# Patient Record
Sex: Male | Born: 1935 | Race: White | Hispanic: No | Marital: Married | Smoking: Former smoker
Health system: Southern US, Community
[De-identification: ages and names within clinical notes are randomized; demographics above are authoritative.]

## PROBLEM LIST (undated history)

## (undated) DIAGNOSIS — E785 Hyperlipidemia, unspecified: Secondary | ICD-10-CM

## (undated) DIAGNOSIS — I2510 Atherosclerotic heart disease of native coronary artery without angina pectoris: Secondary | ICD-10-CM

## (undated) DIAGNOSIS — R079 Chest pain, unspecified: Secondary | ICD-10-CM

## (undated) DIAGNOSIS — R0609 Other forms of dyspnea: Secondary | ICD-10-CM

## (undated) DIAGNOSIS — I10 Essential (primary) hypertension: Secondary | ICD-10-CM

## (undated) DIAGNOSIS — R0600 Dyspnea, unspecified: Secondary | ICD-10-CM

## (undated) DIAGNOSIS — Z951 Presence of aortocoronary bypass graft: Secondary | ICD-10-CM

## (undated) DIAGNOSIS — Z9889 Other specified postprocedural states: Secondary | ICD-10-CM

## (undated) HISTORY — DX: Dyspnea, unspecified: R06.00

## (undated) HISTORY — PX: VASCULAR SURGERY: SHX849

## (undated) HISTORY — DX: Other forms of dyspnea: R06.09

## (undated) HISTORY — DX: Chest pain, unspecified: R07.9

## (undated) HISTORY — DX: Other specified postprocedural states: Z98.890

---

## 2003-11-14 ENCOUNTER — Ambulatory Visit (HOSPITAL_COMMUNITY): Admission: RE | Admit: 2003-11-14 | Discharge: 2003-11-14 | Payer: Self-pay | Admitting: Gastroenterology

## 2003-11-14 ENCOUNTER — Encounter (INDEPENDENT_AMBULATORY_CARE_PROVIDER_SITE_OTHER): Payer: Self-pay | Admitting: *Deleted

## 2008-12-13 ENCOUNTER — Ambulatory Visit (HOSPITAL_COMMUNITY): Admission: RE | Admit: 2008-12-13 | Discharge: 2008-12-13 | Payer: Self-pay | Admitting: General Surgery

## 2008-12-26 ENCOUNTER — Encounter: Admission: RE | Admit: 2008-12-26 | Discharge: 2008-12-26 | Payer: Self-pay | Admitting: Internal Medicine

## 2008-12-30 ENCOUNTER — Encounter: Admission: RE | Admit: 2008-12-30 | Discharge: 2008-12-30 | Payer: Self-pay | Admitting: Internal Medicine

## 2009-01-03 ENCOUNTER — Ambulatory Visit: Payer: Self-pay | Admitting: Vascular Surgery

## 2009-01-06 ENCOUNTER — Encounter: Payer: Self-pay | Admitting: Vascular Surgery

## 2009-01-06 ENCOUNTER — Inpatient Hospital Stay (HOSPITAL_COMMUNITY): Admission: RE | Admit: 2009-01-06 | Discharge: 2009-01-11 | Payer: Self-pay | Admitting: Vascular Surgery

## 2009-01-06 ENCOUNTER — Ambulatory Visit: Payer: Self-pay | Admitting: Vascular Surgery

## 2009-01-17 ENCOUNTER — Ambulatory Visit: Payer: Self-pay | Admitting: Vascular Surgery

## 2009-01-24 ENCOUNTER — Ambulatory Visit: Payer: Self-pay | Admitting: Vascular Surgery

## 2009-03-28 ENCOUNTER — Ambulatory Visit: Payer: Self-pay | Admitting: Vascular Surgery

## 2011-01-02 LAB — BLOOD GAS, ARTERIAL
Acid-base deficit: 0.2 mmol/L (ref 0.0–2.0)
Bicarbonate: 22.3 mEq/L (ref 20.0–24.0)
Bicarbonate: 23.1 mEq/L (ref 20.0–24.0)
O2 Saturation: 96.6 %
Patient temperature: 98.6
TCO2: 24.2 mmol/L (ref 0–100)
pH, Arterial: 7.399 (ref 7.350–7.450)
pH, Arterial: 7.461 — ABNORMAL HIGH (ref 7.350–7.450)
pO2, Arterial: 87.5 mmHg (ref 80.0–100.0)

## 2011-01-02 LAB — CBC
HCT: 36.3 % — ABNORMAL LOW (ref 39.0–52.0)
HCT: 43.1 % (ref 39.0–52.0)
Hemoglobin: 15.1 g/dL (ref 13.0–17.0)
MCHC: 34.3 g/dL (ref 30.0–36.0)
MCHC: 36 g/dL (ref 30.0–36.0)
MCHC: 35 g/dL (ref 30.0–36.0)
MCV: 93.3 fL (ref 78.0–100.0)
MCV: 93.9 fL (ref 78.0–100.0)
MCV: 94.2 fL (ref 78.0–100.0)
MCV: 92.9 fL (ref 78.0–100.0)
Platelets: 164 10*3/uL (ref 150–400)
Platelets: 181 10*3/uL (ref 150–400)
Platelets: 278 10*3/uL (ref 150–400)
RBC: 3.9 MIL/uL — ABNORMAL LOW (ref 4.22–5.81)
RBC: 4.32 MIL/uL (ref 4.22–5.81)
RDW: 13.4 % (ref 11.5–15.5)
RDW: 13.9 % (ref 11.5–15.5)
RDW: 14.2 % (ref 11.5–15.5)
WBC: 12.6 10*3/uL — ABNORMAL HIGH (ref 4.0–10.5)

## 2011-01-02 LAB — PROTIME-INR
INR: 1 (ref 0.00–1.49)
Prothrombin Time: 14.9 seconds (ref 11.6–15.2)
Prothrombin Time: 13 seconds (ref 11.6–15.2)

## 2011-01-02 LAB — GLUCOSE, CAPILLARY
Glucose-Capillary: 141 mg/dL — ABNORMAL HIGH (ref 70–99)
Glucose-Capillary: 174 mg/dL — ABNORMAL HIGH (ref 70–99)

## 2011-01-02 LAB — COMPREHENSIVE METABOLIC PANEL
ALT: 18 U/L (ref 0–53)
Albumin: 2.7 g/dL — ABNORMAL LOW (ref 3.5–5.2)
Albumin: 4.1 g/dL (ref 3.5–5.2)
Alkaline Phosphatase: 42 U/L (ref 39–117)
Alkaline Phosphatase: 67 U/L (ref 39–117)
BUN: 14 mg/dL (ref 6–23)
Chloride: 106 mEq/L (ref 96–112)
Creatinine, Ser: 0.82 mg/dL (ref 0.4–1.5)
GFR calc non Af Amer: >60 mL/min (ref >60)
Glucose, Bld: 94 mg/dL (ref 70–99)
Potassium: 3.9 mEq/L (ref 3.5–5.1)
Potassium: 4.3 mEq/L (ref 3.5–5.1)
Sodium: 137 mEq/L (ref 135–145)
Total Bilirubin: 0.6 mg/dL (ref 0.3–1.2)
Total Protein: 5 g/dL — ABNORMAL LOW (ref 6.0–8.3)

## 2011-01-02 LAB — URINALYSIS, ROUTINE W REFLEX MICROSCOPIC
Bilirubin Urine: NEGATIVE
Hgb urine dipstick: NEGATIVE
Ketones, ur: NEGATIVE mg/dL
Nitrite: NEGATIVE
Protein, ur: NEGATIVE mg/dL
Urobilinogen, UA: 1 mg/dL (ref 0.0–1.0)

## 2011-01-02 LAB — CROSSMATCH: Antibody Screen: NEGATIVE

## 2011-01-02 LAB — BASIC METABOLIC PANEL
BUN: 10 mg/dL (ref 6–23)
CO2: 24 mEq/L (ref 19–32)
CO2: 26 mEq/L (ref 19–32)
Chloride: 104 mEq/L (ref 96–112)
Chloride: 106 mEq/L (ref 96–112)
Creatinine, Ser: 0.87 mg/dL (ref 0.4–1.5)
Creatinine, Ser: 0.89 mg/dL (ref 0.4–1.5)
GFR calc Af Amer: >60 mL/min (ref >60)
GFR calc Af Amer: >60 mL/min (ref >60)
Potassium: 4.2 mEq/L (ref 3.5–5.1)

## 2011-01-02 LAB — POCT I-STAT 7, (LYTES, BLD GAS, ICA,H+H)
Calcium, Ion: 1.08 mmol/L — ABNORMAL LOW (ref 1.12–1.32)
O2 Saturation: 100 %
Potassium: 4 mEq/L (ref 3.5–5.1)
Sodium: 138 mEq/L (ref 135–145)
TCO2: 26 mmol/L (ref 0–100)
pCO2 arterial: 39 mmHg (ref 35.0–45.0)

## 2011-01-02 LAB — AMYLASE: Amylase: 72 U/L (ref 27–131)

## 2011-01-02 LAB — HEMOGLOBIN A1C: Hgb A1c MFr Bld: 5.3 % (ref 4.6–6.1)

## 2011-01-02 LAB — APTT: aPTT: 28 seconds (ref 24–37)

## 2011-02-05 NOTE — Assessment & Plan Note (Signed)
OFFICE VISIT   Derek Wells, Derek Wells A  DOB:  [birth year]                                       01/24/2009  CHART#:[id number]   The patient is a status post resection and grafting of a large  infrarenal abdominal aortic aneurysm with an aortobi-common iliac graft  performed on April 16.  He had an uneventful postoperative course and  since his discharge has had some moderate problems with poor appetite  and some mild nausea but no vomiting.  His bowel movements are being  regulated with daily Dulcolax tablets and he has had no abdominal  distention.  He is drinking plenty of liquids but has lost between 15  and 20 pounds but feels that his appetite is beginning to improve and  that his weight has stabilized.   PHYSICAL EXAM:  Today blood pressure 114/76, heart rate is 100,  respirations 14.  His abdominal incision is well-healed with no evidence  of ventral hernia in the midline.  His abdomen is soft with minimal  tenderness.  He has 3+ femoral and distal pulses bilaterally.  ABIs are  1.09 on the right and 1.23 on the left.   In general he is making good progress but does have typical anorexia and  weakness which should resolve at any time.  I have encouraged him to  drink plenty of liquids and to increase his activity each day as  tolerated which should help his appetite.  He was given a prescription  for Phenergan tablets to use p.r.n. for nausea and he is no longer  needing any pain medicine.  He will return to see me in 2 months for  further followup unless he has any problems in the interim.   Quita Skye Hart Rochester, M.D.  Electronically Signed   JDL/MEDQ  D:  01/24/2009  T:  01/25/2009  Job:  [id number]   cc:   Candyce Churn, M.D.

## 2011-02-05 NOTE — H&P (Signed)
HISTORY AND PHYSICAL EXAMINATION   January 03, 2009   Re:  AGUSTINE, ROSSITTO A                  DOB:  1933/06/07   CHIEF COMPLAINT:  Infrarenal abdominal aortic aneurysm.   HISTORY OF PRESENT ILLNESS:  This 77 year old male patient recently had  inguinal hernia repair by Dr. Claud Kelp.  Examination under  anesthesia revealed a pulsatile mass and a subsequent CT scan revealed  an 8.4 cm infrarenal abdominal aortic aneurysm.  The infrarenal neck was  too large for aortic stent grafting and he was scheduled for an elective  resection and grafting of this large aneurysm.  He had no previous CT  scans or history of abdominal or back symptoms.   PAST MEDICAL HISTORY:  1. Hypertension.  2. Hyperlipidemia.  3. Negative for diabetes, COPD, stroke.  There is questionable history      of coronary artery disease 20 years ago with a possible small      myocardial infarction although not confirmed.  Recent Cardiolite      performed.   PAST SURGICAL HISTORY:  1. Left inguinal hernia repair.  2. Scrotal hernia repair at age 65.   FAMILY HISTORY:  Positive for coronary artery disease in his mother,  father and brother.  Positive for stroke possibly in his mother.  Negative for diabetes.   SOCIAL HISTORY:  He is widowed, has two children and is retired, worked  for [employer] and retired 14 years ago.  Has not smoked cigarettes in many  years but does smoke occasional cigars.  Drinks occasional alcohol.   REVIEW OF SYSTEMS:  Denies any chest pain, dyspnea on exertion, PND,  orthopnea, productive cough, bronchitis, asthma, wheezing.  No GI or GU  symptoms.  No claudication.  Generally unremarkable.   ALLERGIES:  To penicillin which causes a rash.   MEDICATIONS:  1. Lipitor 20 mg one daily.  2. Diovan 12.5 mg one daily.  3. Multivitamin.  4. Centrum one daily.  5. Fish oil 1000 mg one daily.  6. Aspirin 81 mg one daily.   PHYSICAL EXAMINATION:  Vital signs:  Blood pressure  150/80, heart rate  68, respirations 14.  General:  He is a healthy-appearing male in no  apparent distress.  Alert and oriented x3.  Neck:  Supple, 3+ carotid  pulses palpable.  No bruits are audible.  Neurologic:  Normal.  No  palpable adenopathy in the neck.  Chest:  Clear to auscultation.  Cardiovascular:  Regular rhythm.  No murmurs.  Abdomen:  Soft with a  large pulsatile mass approximating 8-9 cm which is nontender.  He has 3+  femoral, popliteal and 2+ posterior tibial pulses bilaterally.  Both  feet are well-perfused.   IMPRESSION:  1. Large infrarenal abdominal aortic aneurysm.  2. Hypertension.  3. Hyperlipidemia.   PLAN:  Is to admit the patient on April 16 for an elective resection and  grafting of his large infrarenal abdominal aortic aneurysm.  Risks and  benefits have been thoroughly discussed with the patient and he would  like to proceed.  He did have a preoperative Cardiolite examination  performed.   Quita Skye Hart Rochester, M.D.  Electronically Signed   JDL/MEDQ  D:  01/03/2009  T:  01/04/2009  Job:  [id number]   cc:   Candyce Churn, M.D.

## 2011-02-05 NOTE — Op Note (Signed)
NAME:  Derek Wells, Derek Wells NO.:  [id number]   MEDICAL RECORD NO.:  [id number]          PATIENT TYPE:  INP   LOCATION:  2315                         FACILITY:  [facility]   PHYSICIAN:  Quita Skye. Hart Rochester, M.D.  DATE OF BIRTH:  May 05, 1933   DATE OF PROCEDURE:  01/06/2009  DATE OF DISCHARGE:                               OPERATIVE REPORT   PREOPERATIVE DIAGNOSIS:  Large infrarenal abdominal aortic aneurysm.   POSTOPERATIVE DIAGNOSIS:  Large infrarenal abdominal aortic aneurysm.   OPERATION:  Resection and grafting of large infrarenal abdominal aortic  aneurysm with insertion of an aorto-bi-common iliac graft (18 x 9 mm)  Hemashield Dacron.   SURGEON:  Travante Knee. Hart Rochester, MD   FIRST ASSISTANT:  Jerold Coombe, PA   ANESTHESIA:  General endotracheal.   PROCEDURE:  The patient was taken to the operating room and placed in  the supine position at which time satisfactory general endotracheal  anesthesia was administered.  After induction of anesthesia and  insertion of a Swan-Ganz catheter and radial arterial line by  Anesthesia, the abdomen and groins were prepped with Betadine scrub and  solution and draped in a routine sterile manner.  Midline incision was  made in xiphoid to near the pubis, carried down through the subcutaneous  tissue and linea alba using Bovie.  Peritoneal cavity was entered and  thoroughly explored.  The stomach, duodenum, small bowel, and colon were  unremarkable.  Liver was smooth.  No palpable masses.  Gallbladder  appeared normal.  No stones were palpable.  Transverse colon was  elevated.  The intestines were reflected to the right side exposing a  large infrarenal aneurysm measuring between 8-9 cm in diameter.  Retroperitoneum was incised.  The neck of the aneurysm was dissected  free and was quite generous being about 3-3.5 mm in maximum diameter.  Left renal vein was mobilized to assist in exposure.  Both common iliac  arteries had diffuse  calcification, but were widely patent on the  preoperative study and had excellent pulses.  This was all dissected  free.  The patient was given 25 g of mannitol and heparinized.  Aorta  was occluded distal to the renal arteries.  Both common iliac arteries  were occluded.  Aneurysm was opened longitudinally and a large amount  laminated thrombus was removed.  There were a few lumbars, oversewn with  2-0 silk figure-of-eight sutures from within.  Inferior mesenteric  artery was oversewn at its origin with 2-0 silk figure-of-eight stitch.  Aneurysm neck was transected about 2-3 cm distal to the renal arteries  and was relatively free of disease.  An 18 x 9 mm Hemashield Dacron  graft anastomosed end-to-end to the aortic stump using continuous 3-0  buttressing this with a strip of felt.  This was checked for leaks, none  were present.  Following this, both common iliac arteries having been  transected about 3 cm proximal to the bifurcation were examined and had  good backbleeding.  Although they were diffusely diseased, they were  widely patent.  End-to-end anastomoses were done, left initially  followed by the right with 5-0 Prolene.  After opening both legs and no  significant hypotension occurred, protamine was given to reverse the  heparin and following adequate hemostasis, aneurysm closed over the  graft with 3-0 Vicryl.  Retroperitoneum  was reapproximated with 3-0 Vicryl and following thorough irrigation,  linea alba closed with #1 Prolene.  Skin was closed with clips.  Sterile  dressing was applied.  The patient was taken to the recovery room in  stable condition, extubated, had an excellent urinary output, received  700 mL of blood from the Cell Saver.      Quita Skye Hart Rochester, M.D.  Electronically Signed     JDL/MEDQ  D:  01/06/2009  T:  01/07/2009  Job:  [id number]

## 2011-02-05 NOTE — Op Note (Signed)
NAME:  Derek Wells, Derek Wells NO.:  [id number]   MEDICAL RECORD NO.:  [id number]          PATIENT TYPE:  AMB   LOCATION:  DAY                          FACILITY:  [facility]   PHYSICIAN:  Angelia Mould. Derrell Lolling, M.D.DATE OF BIRTH:  05-16-1933   DATE OF PROCEDURE:  12/13/2008  DATE OF DISCHARGE:                               OPERATIVE REPORT   PREOPERATIVE DIAGNOSIS:  Recurrent left inguinal hernia.   POSTOPERATIVE DIAGNOSIS:  Recurrent left inguinal hernia.   OPERATION PERFORMED:  Laparoscopic, preperitoneal repair of recurrent  left inguinal hernia with mesh.   SURGEON:  Dr. Claud Kelp.   OPERATIVE INDICATIONS:  This is a 77 year old white man who had a left  inguinal repair when he was in the ninth grade and he states that he had  a prophylactic appendectomy done at the same time.  He recently  presented with a 2-year history of a bulge and some burning discomfort  in his left groin and this has been getting larger.  On evaluation in my  office, I found that he had a small to medium sized left inguinal hernia  that was reducible but no evidence of hernia on the right.  No scrotal  mass.  He is brought to the operating room electively.   OPERATIVE FINDINGS:  The patient had an obvious moderate-sized direct  left inguinal hernia.  The peritoneum was riding up very high on the  cord structures but was not actually entering the internal ring.  We did  strip this well back and away.  He had a small lipoma of the cord which  was also reduced.  It is notable that when he was put to sleep, I was  palpating his abdomen and felt that he might have an abdominal aortic  aneurysm.  I discussed this with Dr. Johnella Moloney by phone and he is  going to call the patient tomorrow and arrange for an ultrasound and  evaluation of possible abdominal aortic aneurysm.   OPERATIVE TECHNIQUE:  Following the induction of general endotracheal  anesthesia, a Foley catheter was inserted, the  bladder was emptied and  the Foley balloon deflated.  The abdomen and genitalia were prepped and  draped in sterile fashion.  Intravenous antibiotics were given.  The  patient was identified as to correct patient, correct procedure, and  correct site.  0.5% Marcaine with epinephrine was used as a local  infiltration anesthetic.  Transverse incision was made at the lower rim  of the umbilicus.  The fascia was incised transversely, exposing the  medial border of the left rectus muscle.  We bluntly dissected the  rectus muscle away from the rectus sheath.  A balloon dissector was  inserted into the left rectus sheath and inserted down in the midline to  just above the symphysis pubis.  The video cam was inserted.  The  dissector balloon was inflated with air under direct vision.  We had  good visualization of the rectus muscles anteriorly, preperitoneal fat  posteriorly, and Cooper's ligaments and symphysis pubis inferiorly.  We  held the balloon in place  for 5 minutes.  The balloon was then deflated  and removed.  The trocar balloon was inflated and secured.  The trocar  was connected to the insufflator at 12 mmHg and the preperitoneal space  was inflated.  The video cam was inserted.  We had good visualization.  A 5-mm trocar was placed in the infraumbilical midline under direct  vision.  I used this to clean off the muscle on the right lower quadrant  a little bit and inserted a 5-mm trocar in the right lower quadrant.   I then dissected the left side.  We identified the symphysis pubis and  Cooper's ligament.  He had a pseudo sac associated with the direct  hernia on the left.  This was adherent to the fatty tissues and this was  dissected completely away and then the pseudo sac completely retracted  back up in the direct hernia space.  We then dissected the cord  structures on the left side being careful to identify the vas deferens  and testicular vessels and staying away from the  iliac vessels.  The  edge of the peritoneum was riding very high on the cord structures on  the left side but the peritoneum was not entering the internal ring.  I  dissected the peritoneum completely off of the cord structures back  above the level of the anterior superior iliac spine.  We cleaned off  the muscle laterally.  We inserted a Bard 3-D Max mesh, large size and  positioned it on the left side so that it overlapped the midline  slightly, overlapped Cooper's ligaments slightly, and was deployed  laterally quite nicely.  We used a 5 mm screw tacker.  I placed 4 screws  long the superior rim of Cooper's ligament and the symphysis pubis.  Laterally, the mesh deployed quite nicely well lateral to the internal  ring.  I secured this with about 3 screw tacks.  I was very careful  laterally to be sure that I could palpate the screw tacker through the  abdominal wall to be sure that I stayed above the iliopubic tract.  I  then secured the mesh along its superior rim with a few screw tacks and  up the midline with a couple of screw tacks and the repair appeared  complete.  The mesh laid nicely without any over folding or fluting and  I was very satisfied.  There was no bleeding.  The pneumoperitoneum was  released.  All the trocars were removed.  The fascia at the umbilicus  was closed with two interrupted figure-of-eight sutures of 0 Vicryl.  The skin incisions were closed with subcuticular sutures of 4-0 Monocryl  and Steri-Strips.  Clean bandages were placed and the patient taken to  the recovery room in stable condition.  Estimated blood loss was about  10 mL.  Complications none.  Sponge, needle and instrument counts were  correct.      Angelia Mould. Derrell Lolling, M.D.  Electronically Signed     HMI/MEDQ  D:  12/13/2008  T:  12/13/2008  Job:  [id number]   cc:   Candyce Churn, M.D.  Fax: [phone]

## 2011-02-05 NOTE — Discharge Summary (Signed)
NAME:  Derek Wells, Derek Wells NO.:  [id number]   MEDICAL RECORD NO.:  [id number]          PATIENT TYPE:  INP   LOCATION:  2029                         FACILITY:  [facility]   PHYSICIAN:  Quita Skye. Hart Rochester, M.D.  DATE OF BIRTH:  [birth year]   DATE OF ADMISSION:  01/06/2009  DATE OF DISCHARGE:  01/11/2009                               DISCHARGE SUMMARY   FINAL DISCHARGE DIAGNOSES:  1. An 8.4-cm infrarenal abdominal aortic aneurysm.  2. Hypertension.  3. Hyperlipidemia.   PROCEDURES PERFORMED:  Resection and grafting of a large infrarenal  abdominal aortic aneurysm with insertion of aortobifemoral common iliac  graft 18- x 9-mm Hemashield Dacron by Dr. Hart Rochester, January 06, 2009.   COMPLICATIONS:  None.   CONDITION ON DISCHARGE:  Stable, improving.   DISCHARGE MEDICATIONS:  He is instructed to resume all preoperative  medications consisting of:  1. Lipitor 40 mg one-half tablet p.o. daily.  2. Baby aspirin 81 mg p.o. daily.  3. Fish oil 1000 mg p.o. daily.  4. Diovan/hydrochlorothiazide 160/12.5 mg p.o. daily.  5. He is given a prescription for Percocet 5/325 p.o. q.4 h. p.r.n.      pain, total #40 were given.   DISPOSITION:  He is being discharged home in stable condition with his  wounds healing well.  He is given very careful instructions regarding  the care of his wounds and his activity level.  He is given an return  appointment in 1 week for staple removal and 2 weeks for evaluation by  Dr. Hart Rochester.  The office will arrange a visit.   BRIEF IDENTIFYING STATEMENT:  For complete details, please refer the  typed history and physical.  Briefly, this very pleasant 77 year old  gentleman was referred to Dr. Hart Rochester following repair of an inguinal  hernia at which time a large pulsatile mass in his abdomen was felt.  Dr. Hart Rochester evaluated him and found him to have an 8.4-cm infrarenal  abdominal aortic aneurysm.  He recommended repair.  [name] was  informed of the risks and  benefits of the procedure and after careful  consideration, he elected to proceed with surgery.   HOSPITAL COURSE:  Preoperative workup was completed as an outpatient.  He was brought in through outpatient surgery and underwent the  aforementioned repair of his abdominal aortic aneurysm.  For complete  details, please refer to the typed operative report.  The procedure was  without complications.  He was returned to a bed in intensive care unit  in critical, but hemodynamically stable condition.  He was able to be  extubated by the first postoperative  day.  He was able to be transferred to a bed on a surgical convalescent  floor on the first postoperative day.  Following his transfer, his  activity level and diet were increased as tolerated.  By January 11, 2009,  he was desirous of discharge.  He was felt stable and was discharged  home in stable condition.      Wilmon Arms, PA      Quita Skye Hart Rochester, M.D.  Electronically Signed  KEL/MEDQ  D:  01/11/2009  T:  01/11/2009  Job:  [id number]

## 2011-02-05 NOTE — Assessment & Plan Note (Signed)
OFFICE VISIT   RAMAJ, FRANGOS A  DOB:  07-16-1933                                       03/28/2009  CHART#:[id number]   The patient is 3 months status post resection and grafting of a large  infrarenal abdominal aortic aneurysm with insertion of aortobicommon  iliac graft.  He has done very well with no complications or specific  complaints today other than some occasional mild dizziness when he  arises.  He is having regular bowel movements.  His normal appetite has  returned.  He has some mild urinary obstruction symptoms with nocturia,  a little more so that it was preoperatively.  Otherwise, he has no  complaints and no claudication.   On physical examination blood pressure 177/84 sitting, 161/76 supine,  heart rate is 87.  Abdominal incision is well-healed.  No evidence of  ventral hernia.  He has 3+ femoral, popliteal and posterior tibial  pulses bilaterally.  Both feet are well-perfused.   I am very pleased with his results.  I encouraged him to resume his  normal activities as tolerated and he will return to see [place] on a p.r.n.  basis.   Quita Skye Hart Rochester, M.D.  Electronically Signed   JDL/MEDQ  D:  03/28/2009  T:  03/29/2009  Job:  [id number]

## 2011-02-08 NOTE — Op Note (Signed)
NAME:  Derek Wells, Derek Wells                            ACCOUNT NO.:  [id number]   MEDICAL RECORD NO.:  [id number]                   PATIENT TYPE:  AMB   LOCATION:  ENDO                                 FACILITY:  [facility]   PHYSICIAN:  Stefen L. Malon Kindle., M.D.          DATE OF BIRTH:  [birth year]   DATE OF PROCEDURE:  11/14/2003  DATE OF DISCHARGE:                                 OPERATIVE REPORT   PROCEDURE:  Colonoscopy and polypectomy.   MEDICATIONS:  Fentanyl 100 mcg, Versed 10 mg IV, Phenergan 12.5 mg IV.   ENDOSCOPE:  Olympus pediatric adjustable colonoscope.   INDICATIONS:  Colon cancer screening.   DESCRIPTION OF PROCEDURE:  The procedure had been explained to the patient  and consent obtained.  With the patient in the left lateral decubitus  position, the Olympus scope was inserted and advanced.  We advanced into the  colon.  Prep was excellent.  We were able to advance easily to the cecum.  The ileocecal valve and appendiceal orifice were seen.  The scope was  withdrawn.  The cecum was seen well.  In the mid-ascending colon a 0.75 cm  sessile polyp was encountered and removed with the snare and sucked through  the scope.  The transverse colon was free of polyps.  In the proximal  descending colon a 0.75 cm polyp was removed and sucked through the scope.  There was no bleeding at either of the polypectomy sites.  No other polyps  were seen in the descending or sigmoid colon.  There was no significant  diverticulosis.  The scope was withdrawn, and the rectum was free of polyps.  The patient tolerated the procedure well.   ASSESSMENT:  Colon polyps removed, 211.3.   PLAN:  Routine postpolypectomy instructions.  Will recommend repeating in  three years.                                               Reedy L. Malon Kindle., M.D.    Waldron Session  D:  11/14/2003  T:  11/14/2003  Job:  [id number]   cc:   Candyce Churn, M.D.  [address] [place]  [place] [address]  Fax:  [phone]

## 2012-09-10 ENCOUNTER — Encounter: Payer: Self-pay | Admitting: Vascular Surgery

## 2013-01-14 ENCOUNTER — Other Ambulatory Visit: Payer: Self-pay | Admitting: *Deleted

## 2013-01-14 ENCOUNTER — Encounter (HOSPITAL_COMMUNITY): Payer: Self-pay

## 2013-01-14 NOTE — Addendum Note (Signed)
Addended by: Nicki Guadalajara A on: 01/14/2013 07:05 PM   Modules accepted: Orders

## 2013-01-15 ENCOUNTER — Encounter (HOSPITAL_COMMUNITY): Payer: Self-pay | Admitting: Certified Registered"

## 2013-01-15 ENCOUNTER — Ambulatory Visit (HOSPITAL_COMMUNITY): Payer: Medicare Other

## 2013-01-15 ENCOUNTER — Inpatient Hospital Stay (HOSPITAL_COMMUNITY)
Admission: RE | Admit: 2013-01-15 | Discharge: 2013-01-19 | DRG: 234 | Disposition: A | Discharge status: Home / Self Care | Payer: Medicare Other | Source: Ambulatory Visit | Attending: Cardiovascular Disease | Admitting: Cardiovascular Disease

## 2013-01-15 ENCOUNTER — Encounter (HOSPITAL_COMMUNITY)
Admission: RE | Disposition: A | Discharge status: Home / Self Care | Payer: Self-pay | Source: Ambulatory Visit | Attending: Cardiovascular Disease

## 2013-01-15 ENCOUNTER — Encounter (HOSPITAL_COMMUNITY): Payer: Self-pay | Admitting: Certified Registered Nurse Anesthetist

## 2013-01-15 ENCOUNTER — Ambulatory Visit (HOSPITAL_COMMUNITY): Payer: Medicare Other | Admitting: Certified Registered"

## 2013-01-15 ENCOUNTER — Inpatient Hospital Stay (HOSPITAL_COMMUNITY): Payer: Medicare Other

## 2013-01-15 DIAGNOSIS — Z8249 Family history of ischemic heart disease and other diseases of the circulatory system: Secondary | ICD-10-CM

## 2013-01-15 DIAGNOSIS — I200 Unstable angina: Secondary | ICD-10-CM | POA: Diagnosis present

## 2013-01-15 DIAGNOSIS — D696 Thrombocytopenia, unspecified: Secondary | ICD-10-CM | POA: Diagnosis not present

## 2013-01-15 DIAGNOSIS — D62 Acute posthemorrhagic anemia: Secondary | ICD-10-CM | POA: Diagnosis not present

## 2013-01-15 DIAGNOSIS — I2510 Atherosclerotic heart disease of native coronary artery without angina pectoris: Principal | ICD-10-CM | POA: Diagnosis present

## 2013-01-15 DIAGNOSIS — I700 Atherosclerosis of aorta: Secondary | ICD-10-CM | POA: Diagnosis present

## 2013-01-15 DIAGNOSIS — I059 Rheumatic mitral valve disease, unspecified: Secondary | ICD-10-CM | POA: Diagnosis present

## 2013-01-15 DIAGNOSIS — E8779 Other fluid overload: Secondary | ICD-10-CM | POA: Diagnosis not present

## 2013-01-15 DIAGNOSIS — I10 Essential (primary) hypertension: Secondary | ICD-10-CM | POA: Diagnosis present

## 2013-01-15 DIAGNOSIS — Z951 Presence of aortocoronary bypass graft: Secondary | ICD-10-CM | POA: Diagnosis present

## 2013-01-15 DIAGNOSIS — E785 Hyperlipidemia, unspecified: Secondary | ICD-10-CM | POA: Diagnosis present

## 2013-01-15 DIAGNOSIS — I7389 Other specified peripheral vascular diseases: Secondary | ICD-10-CM | POA: Diagnosis present

## 2013-01-15 HISTORY — DX: Essential (primary) hypertension: I10

## 2013-01-15 HISTORY — PX: LEFT HEART CATHETERIZATION WITH CORONARY ANGIOGRAM: SHX5451

## 2013-01-15 HISTORY — PX: CARDIAC CATHETERIZATION: SHX172

## 2013-01-15 HISTORY — PX: CORONARY ARTERY BYPASS GRAFT: SHX141

## 2013-01-15 HISTORY — DX: Presence of aortocoronary bypass graft: Z95.1

## 2013-01-15 HISTORY — PX: INTRAOPERATIVE TRANSESOPHAGEAL ECHOCARDIOGRAM: SHX5062

## 2013-01-15 HISTORY — DX: Atherosclerotic heart disease of native coronary artery without angina pectoris: I25.10

## 2013-01-15 HISTORY — DX: Hyperlipidemia, unspecified: E78.5

## 2013-01-15 LAB — POCT I-STAT 4, (NA,K, GLUC, HGB,HCT)
Glucose, Bld: 98 mg/dL (ref 70–99)
Glucose, Bld: 87 mg/dL (ref 70–99)
Glucose, Bld: 110 mg/dL — ABNORMAL HIGH (ref 70–99)
HCT: 39 % (ref 39.0–52.0)
HCT: 27 % — ABNORMAL LOW (ref 39.0–52.0)
HCT: 30 % — ABNORMAL LOW (ref 39.0–52.0)
Hemoglobin: 13.3 g/dL (ref 13.0–17.0)
Hemoglobin: 10.2 g/dL — ABNORMAL LOW (ref 13.0–17.0)
Hemoglobin: 9.2 g/dL — ABNORMAL LOW (ref 13.0–17.0)
Potassium: 3.8 mEq/L (ref 3.5–5.1)
Potassium: 4.8 mEq/L (ref 3.5–5.1)
Potassium: 4.8 mEq/L (ref 3.5–5.1)
Sodium: 141 mEq/L (ref 135–145)
Sodium: 131 mEq/L — ABNORMAL LOW (ref 135–145)
Sodium: 140 mEq/L (ref 135–145)

## 2013-01-15 LAB — BASIC METABOLIC PANEL
BUN: 20 mg/dL (ref 6–23)
BUN: 17 mg/dL (ref 6–23)
CO2: 26 mEq/L (ref 19–32)
Calcium: 9.1 mg/dL (ref 8.4–10.5)
Calcium: 9.1 mg/dL (ref 8.4–10.5)
Creatinine, Ser: 0.94 mg/dL (ref 0.50–1.35)
GFR calc Af Amer: >90 mL/min (ref >90)
GFR calc non Af Amer: 69 mL/min — ABNORMAL LOW (ref >90)
GFR calc non Af Amer: 78 mL/min — ABNORMAL LOW (ref >90)
Glucose, Bld: 116 mg/dL — ABNORMAL HIGH (ref 70–99)
Sodium: 139 mEq/L (ref 135–145)

## 2013-01-15 LAB — PLATELET COUNT: Platelets: 144 10*3/uL — ABNORMAL LOW (ref 150–400)

## 2013-01-15 LAB — CBC
HCT: 43.6 % (ref 39.0–52.0)
HCT: 32.4 % — ABNORMAL LOW (ref 39.0–52.0)
Hemoglobin: 14.7 g/dL (ref 13.0–17.0)
MCH: 31.4 pg (ref 26.0–34.0)
MCH: 31.5 pg (ref 26.0–34.0)
MCHC: 35.3 g/dL (ref 30.0–36.0)
MCHC: 35.6 g/dL (ref 30.0–36.0)
MCHC: 35.2 g/dL (ref 30.0–36.0)
MCV: 88.9 fL (ref 78.0–100.0)
MCV: 89.2 fL (ref 78.0–100.0)
MCV: 89.5 fL (ref 78.0–100.0)
Platelets: 223 10*3/uL (ref 150–400)
RBC: 4.68 MIL/uL (ref 4.22–5.81)
RDW: 14.4 % (ref 11.5–15.5)
RDW: 14.2 % (ref 11.5–15.5)

## 2013-01-15 LAB — POCT I-STAT 3, ART BLOOD GAS (G3+)
pCO2 arterial: 48.6 mmHg — ABNORMAL HIGH (ref 35.0–45.0)
pH, Arterial: 7.308 — ABNORMAL LOW (ref 7.350–7.450)

## 2013-01-15 LAB — PROTIME-INR
INR: 1.37 (ref 0.00–1.49)
Prothrombin Time: 12.9 seconds (ref 11.6–15.2)

## 2013-01-15 LAB — HEMOGLOBIN AND HEMATOCRIT, BLOOD: Hemoglobin: 10.5 g/dL — ABNORMAL LOW (ref 13.0–17.0)

## 2013-01-15 SURGERY — LEFT HEART CATHETERIZATION WITH CORONARY ANGIOGRAM
Anesthesia: LOCAL

## 2013-01-15 SURGERY — CORONARY ARTERY BYPASS GRAFTING (CABG)
Anesthesia: General | Site: Chest | Wound class: Clean

## 2013-01-15 MED ORDER — MAGNESIUM SULFATE 50 % IJ SOLN
40.0000 meq | INTRAMUSCULAR | Status: DC
  Filled 2013-01-15: qty 10

## 2013-01-15 MED ORDER — SODIUM CHLORIDE 0.9 % IV SOLN: 250.0000 mL | INTRAVENOUS | Status: DC

## 2013-01-15 MED ORDER — LACTATED RINGERS IV SOLN: INTRAVENOUS | Status: DC

## 2013-01-15 MED ORDER — LACTATED RINGERS IV SOLN
INTRAVENOUS | Status: DC | PRN
  Administered 2013-01-15: 15:00:00 via INTRAVENOUS

## 2013-01-15 MED ORDER — PHENYLEPHRINE HCL 10 MG/ML IJ SOLN
0.0000 ug/min | INTRAVENOUS | Status: DC
  Filled 2013-01-15: qty 2

## 2013-01-15 MED ORDER — MAGNESIUM SULFATE 40 MG/ML IJ SOLN
4.0000 g | Freq: Once | INTRAMUSCULAR | Status: AC
  Administered 2013-01-15: 4 g via INTRAVENOUS
  Filled 2013-01-15: qty 100

## 2013-01-15 MED ORDER — SODIUM CHLORIDE 0.9 % IV SOLN
INTRAVENOUS | Status: DC | PRN
  Administered 2013-01-15: 20:00:00 via INTRAVENOUS

## 2013-01-15 MED ORDER — DEXMEDETOMIDINE HCL IN NACL 400 MCG/100ML IV SOLN
0.1000 ug/kg/h | INTRAVENOUS | Status: DC
  Administered 2013-01-15: 0.2 ug/kg/h via INTRAVENOUS
  Filled 2013-01-15: qty 100

## 2013-01-15 MED ORDER — ACETAMINOPHEN 160 MG/5ML PO SOLN: 975.0000 mg | Freq: Four times a day (QID) | ORAL | Status: DC

## 2013-01-15 MED ORDER — LIDOCAINE HCL (PF) 1 % IJ SOLN
INTRAMUSCULAR | Status: AC
  Filled 2013-01-15: qty 30

## 2013-01-15 MED ORDER — SODIUM CHLORIDE 0.9 % IV SOLN
100.0000 [IU] | INTRAVENOUS | Status: DC | PRN
  Administered 2013-01-15: 1.3 [IU]/h via INTRAVENOUS

## 2013-01-15 MED ORDER — PLASMA-LYTE 148 IV SOLN
INTRAVENOUS | Status: AC
  Administered 2013-01-15: 18:00:00
  Filled 2013-01-15: qty 2.5

## 2013-01-15 MED ORDER — DIAZEPAM 5 MG PO TABS
5.0000 mg | ORAL_TABLET | ORAL | Status: AC
  Administered 2013-01-15: 5 mg via ORAL
  Filled 2013-01-15: qty 1

## 2013-01-15 MED ORDER — EPINEPHRINE HCL 1 MG/ML IJ SOLN
0.5000 ug/min | INTRAVENOUS | Status: DC
  Filled 2013-01-15: qty 4

## 2013-01-15 MED ORDER — AMINOCAPROIC ACID 250 MG/ML IV SOLN
INTRAVENOUS | Status: DC | PRN
  Administered 2013-01-15: 5 g via INTRAVENOUS

## 2013-01-15 MED ORDER — INSULIN REGULAR BOLUS VIA INFUSION
0.0000 [IU] | Freq: Three times a day (TID) | INTRAVENOUS | Status: DC
  Filled 2013-01-15: qty 10

## 2013-01-15 MED ORDER — ACETAMINOPHEN 500 MG PO TABS
1000.0000 mg | ORAL_TABLET | Freq: Four times a day (QID) | ORAL | Status: DC
  Administered 2013-01-16 – 2013-01-17 (×5): 1000 mg via ORAL
  Filled 2013-01-15 (×9): qty 2

## 2013-01-15 MED ORDER — ROCURONIUM BROMIDE 100 MG/10ML IV SOLN
INTRAVENOUS | Status: DC | PRN
  Administered 2013-01-15: 50 mg via INTRAVENOUS

## 2013-01-15 MED ORDER — LACTATED RINGERS IV SOLN
INTRAVENOUS | Status: DC | PRN
  Administered 2013-01-15 (×2): via INTRAVENOUS

## 2013-01-15 MED ORDER — PROTAMINE SULFATE 10 MG/ML IV SOLN
INTRAVENOUS | Status: DC | PRN
  Administered 2013-01-15: 340 mg via INTRAVENOUS

## 2013-01-15 MED ORDER — THROMBIN 20000 UNITS EX SOLR
OROMUCOSAL | Status: DC | PRN
  Administered 2013-01-15 (×3): via TOPICAL

## 2013-01-15 MED ORDER — LEVOFLOXACIN IN D5W 500 MG/100ML IV SOLN
500.0000 mg | INTRAVENOUS | Status: DC
  Filled 2013-01-15: qty 100

## 2013-01-15 MED ORDER — ATORVASTATIN CALCIUM 40 MG PO TABS
40.0000 mg | ORAL_TABLET | Freq: Every day | ORAL | Status: DC
  Administered 2013-01-16 – 2013-01-19 (×4): 40 mg via ORAL
  Filled 2013-01-15 (×4): qty 1

## 2013-01-15 MED ORDER — SODIUM CHLORIDE 0.9 % IV SOLN
INTRAVENOUS | Status: DC
  Filled 2013-01-15: qty 1

## 2013-01-15 MED ORDER — ASPIRIN 81 MG PO CHEW
324.0000 mg | CHEWABLE_TABLET | ORAL | Status: AC
  Administered 2013-01-15: 324 mg via ORAL
  Filled 2013-01-15: qty 4

## 2013-01-15 MED ORDER — BISACODYL 5 MG PO TBEC: 5.0000 mg | DELAYED_RELEASE_TABLET | Freq: Once | ORAL | Status: DC

## 2013-01-15 MED ORDER — LEVOFLOXACIN IN D5W 500 MG/100ML IV SOLN
500.0000 mg | INTRAVENOUS | Status: AC
  Administered 2013-01-15: 500 mg via INTRAVENOUS
  Filled 2013-01-15: qty 100

## 2013-01-15 MED ORDER — SODIUM CHLORIDE 0.9 % IV SOLN: 250.0000 mL | INTRAVENOUS | Status: DC | PRN

## 2013-01-15 MED ORDER — DEXTROSE-NACL 5-0.45 % IV SOLN
INTRAVENOUS | Status: DC
  Administered 2013-01-15: 09:00:00 via INTRAVENOUS

## 2013-01-15 MED ORDER — ACETAMINOPHEN 10 MG/ML IV SOLN
1000.0000 mg | Freq: Once | INTRAVENOUS | Status: AC
  Administered 2013-01-15: 1000 mg via INTRAVENOUS
  Filled 2013-01-15: qty 100

## 2013-01-15 MED ORDER — POTASSIUM CHLORIDE 2 MEQ/ML IV SOLN
80.0000 meq | INTRAVENOUS | Status: DC
  Filled 2013-01-15: qty 40

## 2013-01-15 MED ORDER — SODIUM CHLORIDE 0.9 % IV SOLN
INTRAVENOUS | Status: DC
  Administered 2013-01-15: 14 mL/h via INTRAVENOUS
  Filled 2013-01-15: qty 40

## 2013-01-15 MED ORDER — METOCLOPRAMIDE HCL 5 MG/ML IJ SOLN
10.0000 mg | Freq: Four times a day (QID) | INTRAMUSCULAR | Status: AC
  Administered 2013-01-16 (×4): 10 mg via INTRAVENOUS
  Filled 2013-01-15 (×4): qty 2

## 2013-01-15 MED ORDER — HEPARIN (PORCINE) IN NACL 2-0.9 UNIT/ML-% IJ SOLN
INTRAMUSCULAR | Status: AC
  Filled 2013-01-15: qty 1000

## 2013-01-15 MED ORDER — OXYCODONE HCL 5 MG PO TABS
5.0000 mg | ORAL_TABLET | ORAL | Status: DC | PRN
Start: 2013-01-15 — End: 2013-01-17
  Administered 2013-01-16: 10 mg via ORAL
  Administered 2013-01-16: 5 mg via ORAL
  Filled 2013-01-15 (×2): qty 2
  Filled 2013-01-15: qty 1

## 2013-01-15 MED ORDER — PANTOPRAZOLE SODIUM 40 MG PO TBEC
40.0000 mg | DELAYED_RELEASE_TABLET | Freq: Every day | ORAL | Status: DC
  Administered 2013-01-17: 40 mg via ORAL
  Filled 2013-01-15: qty 1

## 2013-01-15 MED ORDER — LEVOFLOXACIN IN D5W 750 MG/150ML IV SOLN
750.0000 mg | INTRAVENOUS | Status: AC
  Administered 2013-01-16: 750 mg via INTRAVENOUS
  Filled 2013-01-15: qty 150

## 2013-01-15 MED ORDER — PLASMA-LYTE 148 IV SOLN
INTRAVENOUS | Status: DC
  Filled 2013-01-15: qty 2.5

## 2013-01-15 MED ORDER — SODIUM CHLORIDE 0.9 % IJ SOLN
3.0000 mL | Freq: Two times a day (BID) | INTRAMUSCULAR | Status: DC
  Administered 2013-01-16 – 2013-01-17 (×3): 3 mL via INTRAVENOUS

## 2013-01-15 MED ORDER — NIACIN ER (ANTIHYPERLIPIDEMIC) 500 MG PO TBCR
500.0000 mg | EXTENDED_RELEASE_TABLET | Freq: Every day | ORAL | Status: DC
  Administered 2013-01-16 – 2013-01-18 (×3): 500 mg via ORAL
  Filled 2013-01-15 (×4): qty 1

## 2013-01-15 MED ORDER — VECURONIUM BROMIDE 10 MG IV SOLR
INTRAVENOUS | Status: DC | PRN
  Administered 2013-01-15: 6 mg via INTRAVENOUS
  Administered 2013-01-15: 5 mg via INTRAVENOUS
  Administered 2013-01-15: 4 mg via INTRAVENOUS

## 2013-01-15 MED ORDER — ASPIRIN 81 MG PO CHEW: 324.0000 mg | CHEWABLE_TABLET | Freq: Every day | ORAL | Status: DC

## 2013-01-15 MED ORDER — FENTANYL CITRATE 0.05 MG/ML IJ SOLN
INTRAMUSCULAR | Status: DC | PRN
  Administered 2013-01-15: 600 ug via INTRAVENOUS
  Administered 2013-01-15: 50 ug via INTRAVENOUS
  Administered 2013-01-15 (×3): 250 ug via INTRAVENOUS
  Administered 2013-01-15 (×2): 50 ug via INTRAVENOUS
  Administered 2013-01-15 (×2): 250 ug via INTRAVENOUS

## 2013-01-15 MED ORDER — MIDAZOLAM HCL 2 MG/2ML IJ SOLN
INTRAMUSCULAR | Status: AC
  Filled 2013-01-15: qty 2

## 2013-01-15 MED ORDER — OMEGA-3-ACID ETHYL ESTERS 1 G PO CAPS
1.0000 g | ORAL_CAPSULE | Freq: Every day | ORAL | Status: DC
  Administered 2013-01-17 – 2013-01-19 (×3): 1 g via ORAL
  Filled 2013-01-15 (×3): qty 1

## 2013-01-15 MED ORDER — DOCUSATE SODIUM 100 MG PO CAPS
200.0000 mg | ORAL_CAPSULE | Freq: Every day | ORAL | Status: DC
  Administered 2013-01-16 – 2013-01-17 (×2): 200 mg via ORAL
  Filled 2013-01-15: qty 2

## 2013-01-15 MED ORDER — ALBUMIN HUMAN 5 % IV SOLN
INTRAVENOUS | Status: DC | PRN
  Administered 2013-01-15 (×3): via INTRAVENOUS

## 2013-01-15 MED ORDER — OMEGA-3 FATTY ACIDS 1000 MG PO CAPS: 1.0000 g | ORAL_CAPSULE | Freq: Every day | ORAL | Status: DC

## 2013-01-15 MED ORDER — HEPARIN SODIUM (PORCINE) 1000 UNIT/ML IJ SOLN
INTRAMUSCULAR | Status: DC | PRN
  Administered 2013-01-15 (×2): 36000 [IU] via INTRAVENOUS

## 2013-01-15 MED ORDER — MORPHINE SULFATE 2 MG/ML IJ SOLN
1.0000 mg | INTRAMUSCULAR | Status: AC | PRN
  Administered 2013-01-16: 2 mg via INTRAVENOUS

## 2013-01-15 MED ORDER — SODIUM CHLORIDE 0.9 % IJ SOLN: 3.0000 mL | INTRAMUSCULAR | Status: DC | PRN

## 2013-01-15 MED ORDER — 0.9 % SODIUM CHLORIDE (POUR BTL) OPTIME
TOPICAL | Status: DC | PRN
  Administered 2013-01-15: 6000 mL

## 2013-01-15 MED ORDER — ASPIRIN EC 325 MG PO TBEC
325.0000 mg | DELAYED_RELEASE_TABLET | Freq: Every day | ORAL | Status: DC
  Administered 2013-01-16 – 2013-01-17 (×2): 325 mg via ORAL
  Filled 2013-01-15 (×2): qty 1

## 2013-01-15 MED ORDER — METOPROLOL TARTRATE 25 MG/10 ML ORAL SUSPENSION
12.5000 mg | Freq: Two times a day (BID) | ORAL | Status: DC
  Filled 2013-01-15 (×3): qty 5

## 2013-01-15 MED ORDER — POTASSIUM CHLORIDE 10 MEQ/50ML IV SOLN: 10.0000 meq | INTRAVENOUS | Status: AC

## 2013-01-15 MED ORDER — HEMOSTATIC AGENTS (NO CHARGE) OPTIME
TOPICAL | Status: DC | PRN
  Administered 2013-01-15: 1 via TOPICAL

## 2013-01-15 MED ORDER — VANCOMYCIN HCL IN DEXTROSE 1-5 GM/200ML-% IV SOLN
1000.0000 mg | Freq: Once | INTRAVENOUS | Status: AC
  Administered 2013-01-16: 1000 mg via INTRAVENOUS
  Filled 2013-01-15: qty 200

## 2013-01-15 MED ORDER — DOPAMINE-DEXTROSE 3.2-5 MG/ML-% IV SOLN: 0.0000 ug/kg/min | INTRAVENOUS | Status: DC

## 2013-01-15 MED ORDER — FENTANYL CITRATE 0.05 MG/ML IJ SOLN
INTRAMUSCULAR | Status: AC
  Filled 2013-01-15: qty 2

## 2013-01-15 MED ORDER — NITROGLYCERIN IN D5W 200-5 MCG/ML-% IV SOLN
2.0000 ug/min | INTRAVENOUS | Status: DC
  Administered 2013-01-15 (×2): 5 ug/min via INTRAVENOUS
  Filled 2013-01-15: qty 250

## 2013-01-15 MED ORDER — PROPOFOL 10 MG/ML IV BOLUS
INTRAVENOUS | Status: DC | PRN
  Administered 2013-01-15: 150 mg via INTRAVENOUS

## 2013-01-15 MED ORDER — SODIUM CHLORIDE 0.9 % IV SOLN
INTRAVENOUS | Status: DC
  Administered 2013-01-17: 20 mL via INTRAVENOUS

## 2013-01-15 MED ORDER — DEXMEDETOMIDINE HCL IN NACL 200 MCG/50ML IV SOLN: 0.1000 ug/kg/h | INTRAVENOUS | Status: DC

## 2013-01-15 MED ORDER — METOPROLOL TARTRATE 12.5 MG HALF TABLET: 12.5000 mg | ORAL_TABLET | Freq: Once | ORAL | Status: DC

## 2013-01-15 MED ORDER — FAMOTIDINE IN NACL 20-0.9 MG/50ML-% IV SOLN
20.0000 mg | Freq: Two times a day (BID) | INTRAVENOUS | Status: AC
  Administered 2013-01-15: 20 mg via INTRAVENOUS

## 2013-01-15 MED ORDER — MIDAZOLAM HCL 5 MG/5ML IJ SOLN
INTRAMUSCULAR | Status: DC | PRN
  Administered 2013-01-15 (×3): 1 mg via INTRAVENOUS
  Administered 2013-01-15: 5 mg via INTRAVENOUS
  Administered 2013-01-15: 4 mg via INTRAVENOUS
  Administered 2013-01-15: 1 mg via INTRAVENOUS
  Administered 2013-01-15: 2 mg via INTRAVENOUS

## 2013-01-15 MED ORDER — ALBUMIN HUMAN 5 % IV SOLN
250.0000 mL | INTRAVENOUS | Status: AC | PRN
  Administered 2013-01-16 (×2): 250 mL via INTRAVENOUS

## 2013-01-15 MED ORDER — ARTIFICIAL TEARS OP OINT
TOPICAL_OINTMENT | OPHTHALMIC | Status: DC | PRN
  Administered 2013-01-15: 1 via OPHTHALMIC

## 2013-01-15 MED ORDER — BISACODYL 10 MG RE SUPP: 10.0000 mg | Freq: Every day | RECTAL | Status: DC

## 2013-01-15 MED ORDER — SODIUM CHLORIDE 0.9 % IJ SOLN: 3.0000 mL | Freq: Two times a day (BID) | INTRAMUSCULAR | Status: DC

## 2013-01-15 MED ORDER — METOPROLOL TARTRATE 12.5 MG HALF TABLET
12.5000 mg | ORAL_TABLET | Freq: Two times a day (BID) | ORAL | Status: DC
  Administered 2013-01-16: 12.5 mg via ORAL
  Filled 2013-01-15 (×3): qty 1

## 2013-01-15 MED ORDER — MIDAZOLAM HCL 2 MG/2ML IJ SOLN: 2.0000 mg | INTRAMUSCULAR | Status: DC | PRN

## 2013-01-15 MED ORDER — ADULT MULTIVITAMIN W/MINERALS CH
1.0000 | ORAL_TABLET | Freq: Every day | ORAL | Status: DC
  Administered 2013-01-16 – 2013-01-19 (×4): 1 via ORAL
  Filled 2013-01-15 (×4): qty 1

## 2013-01-15 MED ORDER — LACTATED RINGERS IV SOLN: 500.0000 mL | Freq: Once | INTRAVENOUS | Status: AC | PRN

## 2013-01-15 MED ORDER — NITROGLYCERIN IN D5W 200-5 MCG/ML-% IV SOLN: 0.0000 ug/min | INTRAVENOUS | Status: DC

## 2013-01-15 MED ORDER — SODIUM CHLORIDE 0.45 % IV SOLN: INTRAVENOUS | Status: DC

## 2013-01-15 MED ORDER — THROMBIN 20000 UNITS EX SOLR
CUTANEOUS | Status: DC | PRN
  Administered 2013-01-15: 20000 [IU] via TOPICAL

## 2013-01-15 MED ORDER — DOPAMINE-DEXTROSE 3.2-5 MG/ML-% IV SOLN
2.0000 ug/kg/min | INTRAVENOUS | Status: DC
  Filled 2013-01-15: qty 250

## 2013-01-15 MED ORDER — CHLORHEXIDINE GLUCONATE 4 % EX LIQD: 60.0000 mL | Freq: Once | CUTANEOUS | Status: DC

## 2013-01-15 MED ORDER — ONDANSETRON HCL 4 MG/2ML IJ SOLN: 4.0000 mg | Freq: Four times a day (QID) | INTRAMUSCULAR | Status: DC | PRN

## 2013-01-15 MED ORDER — VANCOMYCIN HCL 10 G IV SOLR
1500.0000 mg | INTRAVENOUS | Status: AC
  Administered 2013-01-15: 1500 mg via INTRAVENOUS
  Filled 2013-01-15: qty 1500

## 2013-01-15 MED ORDER — METOPROLOL TARTRATE 1 MG/ML IV SOLN
2.5000 mg | INTRAVENOUS | Status: DC | PRN
Start: 2013-01-15 — End: 2013-01-17

## 2013-01-15 MED ORDER — PHENYLEPHRINE HCL 10 MG/ML IJ SOLN
30.0000 ug/min | INTRAVENOUS | Status: AC
  Administered 2013-01-15: 10 ug/min via INTRAVENOUS
  Filled 2013-01-15: qty 2

## 2013-01-15 MED ORDER — MORPHINE SULFATE 2 MG/ML IJ SOLN
2.0000 mg | INTRAMUSCULAR | Status: DC | PRN
  Administered 2013-01-16 – 2013-01-17 (×5): 2 mg via INTRAVENOUS
  Filled 2013-01-15 (×2): qty 1
  Filled 2013-01-15: qty 2
  Filled 2013-01-15 (×2): qty 1

## 2013-01-15 MED ORDER — BISACODYL 5 MG PO TBEC
10.0000 mg | DELAYED_RELEASE_TABLET | Freq: Every day | ORAL | Status: DC
  Administered 2013-01-16 – 2013-01-17 (×2): 10 mg via ORAL
  Filled 2013-01-15 (×2): qty 2

## 2013-01-15 MED ORDER — TEMAZEPAM 15 MG PO CAPS: 15.0000 mg | ORAL_CAPSULE | Freq: Once | ORAL | Status: DC | PRN

## 2013-01-15 SURGICAL SUPPLY — 102 items
ATTRACTOMAT 16X20 MAGNETIC DRP (DRAPES) ×3 IMPLANT
BAG DECANTER FOR FLEXI CONT (MISCELLANEOUS) ×3 IMPLANT
BANDAGE ELASTIC 4 VELCRO ST LF (GAUZE/BANDAGES/DRESSINGS) ×3 IMPLANT
BANDAGE ELASTIC 6 VELCRO ST LF (GAUZE/BANDAGES/DRESSINGS) ×3 IMPLANT
BANDAGE GAUZE ELAST BULKY 4 IN (GAUZE/BANDAGES/DRESSINGS) ×3 IMPLANT
BASKET HEART (ORDER IN 25'S) (MISCELLANEOUS) ×1
BASKET HEART (ORDER IN 25S) (MISCELLANEOUS) ×2 IMPLANT
BLADE STERNUM SYSTEM 6 (BLADE) ×3 IMPLANT
CANISTER SUCTION 2500CC (MISCELLANEOUS) ×3 IMPLANT
CANNULA ARTERIAL NVNT 3/8 22FR (MISCELLANEOUS) ×3 IMPLANT
CATH ROBINSON RED A/P 18FR (CATHETERS) ×6 IMPLANT
CATH THORACIC 28FR (CATHETERS) ×3 IMPLANT
CATH THORACIC 28FR RT ANG (CATHETERS) IMPLANT
CATH THORACIC 36FR (CATHETERS) ×3 IMPLANT
CATH THORACIC 36FR RT ANG (CATHETERS) ×3 IMPLANT
CLIP TI MEDIUM 24 (CLIP) IMPLANT
CLIP TI WIDE RED SMALL 24 (CLIP) ×3 IMPLANT
CLOTH BEACON ORANGE TIMEOUT ST (SAFETY) ×3 IMPLANT
COVER SURGICAL LIGHT HANDLE (MISCELLANEOUS) ×3 IMPLANT
CRADLE DONUT ADULT HEAD (MISCELLANEOUS) ×3 IMPLANT
DERMABOND ADVANCED (GAUZE/BANDAGES/DRESSINGS) ×1
DERMABOND ADVANCED .7 DNX12 (GAUZE/BANDAGES/DRESSINGS) ×2 IMPLANT
DEVICE TROCAR PUNCTURE CLOSURE (ENDOMECHANICALS) ×3 IMPLANT
DRAPE CARDIOVASCULAR INCISE (DRAPES) ×1
DRAPE SLUSH MACHINE 52X66 (DRAPES) IMPLANT
DRAPE SLUSH/WARMER DISC (DRAPES) ×3 IMPLANT
DRAPE SRG 135X102X78XABS (DRAPES) ×2 IMPLANT
DRSG COVADERM 4X14 (GAUZE/BANDAGES/DRESSINGS) ×3 IMPLANT
ELECT CAUTERY BLADE 6.4 (BLADE) ×3 IMPLANT
ELECT REM PT RETURN 9FT ADLT (ELECTROSURGICAL) ×6
ELECTRODE REM PT RTRN 9FT ADLT (ELECTROSURGICAL) ×4 IMPLANT
GLOVE BIO SURGEON STRL SZ 6 (GLOVE) ×3 IMPLANT
GLOVE BIO SURGEON STRL SZ 6.5 (GLOVE) ×12 IMPLANT
GLOVE BIO SURGEON STRL SZ7 (GLOVE) ×9 IMPLANT
GLOVE BIO SURGEON STRL SZ7.5 (GLOVE) IMPLANT
GLOVE BIOGEL PI IND STRL 6 (GLOVE) IMPLANT
GLOVE BIOGEL PI IND STRL 6.5 (GLOVE) ×4 IMPLANT
GLOVE BIOGEL PI IND STRL 7.0 (GLOVE) ×6 IMPLANT
GLOVE BIOGEL PI INDICATOR 6 (GLOVE)
GLOVE BIOGEL PI INDICATOR 6.5 (GLOVE) ×2
GLOVE BIOGEL PI INDICATOR 7.0 (GLOVE) ×3
GLOVE EUDERMIC 7 POWDERFREE (GLOVE) ×6 IMPLANT
GLOVE ORTHO TXT STRL SZ7.5 (GLOVE) IMPLANT
GOWN EXTRA PROTECTION XL (GOWNS) ×3 IMPLANT
GOWN PREVENTION PLUS XLARGE (GOWN DISPOSABLE) ×3 IMPLANT
GOWN STRL NON-REIN LRG LVL3 (GOWN DISPOSABLE) ×18 IMPLANT
HEMOSTAT POWDER SURGIFOAM 1G (HEMOSTASIS) ×9 IMPLANT
HEMOSTAT SURGICEL 2X14 (HEMOSTASIS) ×3 IMPLANT
INSERT FOGARTY 61MM (MISCELLANEOUS) IMPLANT
INSERT FOGARTY XLG (MISCELLANEOUS) IMPLANT
KIT BASIN OR (CUSTOM PROCEDURE TRAY) ×3 IMPLANT
KIT CATH CPB BARTLE (MISCELLANEOUS) ×3 IMPLANT
KIT ROOM TURNOVER OR (KITS) ×3 IMPLANT
KIT SUCTION CATH 14FR (SUCTIONS) ×3 IMPLANT
KIT VASOVIEW W/TROCAR VH 2000 (KITS) ×3 IMPLANT
NS IRRIG 1000ML POUR BTL (IV SOLUTION) ×18 IMPLANT
PACK OPEN HEART (CUSTOM PROCEDURE TRAY) ×3 IMPLANT
PAD ARMBOARD 7.5X6 YLW CONV (MISCELLANEOUS) ×6 IMPLANT
PENCIL BUTTON HOLSTER BLD 10FT (ELECTRODE) ×3 IMPLANT
PUNCH AORTIC ROTATE 4.0MM (MISCELLANEOUS) IMPLANT
PUNCH AORTIC ROTATE 4.5MM 8IN (MISCELLANEOUS) ×3 IMPLANT
PUNCH AORTIC ROTATE 5MM 8IN (MISCELLANEOUS) IMPLANT
SET CARDIOPLEGIA MPS 5001102 (MISCELLANEOUS) ×3 IMPLANT
SPONGE GAUZE 4X4 12PLY (GAUZE/BANDAGES/DRESSINGS) ×9 IMPLANT
SPONGE INTESTINAL PEANUT (DISPOSABLE) IMPLANT
SPONGE LAP 18X18 X RAY DECT (DISPOSABLE) ×3 IMPLANT
SPONGE LAP 4X18 X RAY DECT (DISPOSABLE) ×3 IMPLANT
SUT BONE WAX W31G (SUTURE) ×3 IMPLANT
SUT MNCRL AB 4-0 PS2 18 (SUTURE) ×3 IMPLANT
SUT PROLENE 3 0 SH DA (SUTURE) IMPLANT
SUT PROLENE 3 0 SH1 36 (SUTURE) ×3 IMPLANT
SUT PROLENE 4 0 RB 1 (SUTURE)
SUT PROLENE 4 0 SH DA (SUTURE) IMPLANT
SUT PROLENE 4-0 RB1 .5 CRCL 36 (SUTURE) IMPLANT
SUT PROLENE 5 0 C 1 36 (SUTURE) IMPLANT
SUT PROLENE 6 0 C 1 30 (SUTURE) IMPLANT
SUT PROLENE 7 0 BV 1 (SUTURE) IMPLANT
SUT PROLENE 7 0 BV1 MDA (SUTURE) ×3 IMPLANT
SUT PROLENE 8 0 BV175 6 (SUTURE) IMPLANT
SUT SILK  1 MH (SUTURE)
SUT SILK 1 MH (SUTURE) IMPLANT
SUT STEEL STERNAL CCS#1 18IN (SUTURE) IMPLANT
SUT STEEL SZ 6 DBL 3X14 BALL (SUTURE) IMPLANT
SUT VIC AB 1 CTX 36 (SUTURE) ×3
SUT VIC AB 1 CTX36XBRD ANBCTR (SUTURE) ×6 IMPLANT
SUT VIC AB 2-0 CT1 27 (SUTURE) ×2
SUT VIC AB 2-0 CT1 TAPERPNT 27 (SUTURE) ×4 IMPLANT
SUT VIC AB 2-0 CTX 27 (SUTURE) IMPLANT
SUT VIC AB 3-0 SH 27 (SUTURE)
SUT VIC AB 3-0 SH 27X BRD (SUTURE) IMPLANT
SUT VIC AB 3-0 X1 27 (SUTURE) IMPLANT
SUT VICRYL 4-0 PS2 18IN ABS (SUTURE) IMPLANT
SUTURE E-PAK OPEN HEART (SUTURE) ×3 IMPLANT
SYSTEM SAHARA CHEST DRAIN ATS (WOUND CARE) ×3 IMPLANT
TAPE CLOTH SURG 4X10 WHT LF (GAUZE/BANDAGES/DRESSINGS) ×3 IMPLANT
TOWEL OR 17X24 6PK STRL BLUE (TOWEL DISPOSABLE) ×3 IMPLANT
TOWEL OR 17X26 10 PK STRL BLUE (TOWEL DISPOSABLE) ×3 IMPLANT
TRAY FOLEY IC TEMP SENS 14FR (CATHETERS) ×3 IMPLANT
TUBE SUCT INTRACARD DLP 20F (MISCELLANEOUS) ×3 IMPLANT
TUBING INSUFFLATION 10FT LAP (TUBING) ×3 IMPLANT
UNDERPAD 30X30 INCONTINENT (UNDERPADS AND DIAPERS) ×3 IMPLANT
WATER STERILE IRR 1000ML POUR (IV SOLUTION) ×6 IMPLANT

## 2013-01-15 NOTE — Transfer of Care (Signed)
Immediate Anesthesia Transfer of Care Note  Patient: Derek Wells  Procedure(s) Performed: Procedure(s): Coronary Artery Bypass Graft times three using Right Greater Saphenous Vein Graft harvested endoscopically and Left Internal Mammary Artery and (N/A) INTRAOPERATIVE TRANSESOPHAGEAL ECHOCARDIOGRAM  Patient Location: SICU  Anesthesia Type:General  Level of Consciousness: sedated and Patient remains intubated per anesthesia plan  Airway & Oxygen Therapy: Patient remains intubated per anesthesia plan and Patient placed on Ventilator (see vital sign flow sheet for setting)  Post-op Assessment: Report given to SICU  RN, post op vital signs reviewed and stable.   Post vital signs: Reviewed and stable  Complications: No apparent anesthesia complications

## 2013-01-15 NOTE — CV Procedure (Signed)
Derek Wells is a 79 y.o. male    [id number]  [id number] LOCATION:  FACILITY: [facility]  PHYSICIAN: Lennette Bihari, MD, [facility] 08/24/1933   DATE OF PROCEDURE:  01/15/2013  DATE OF DISCHARGE:  [facility]  CARDIAC CATHETERIZATION     Indication: Derek Wells is a 79 year old white male with a history of hypertension, hyperlipidemia, and prior abdominal aortic aneurysm repair surgery. In the fall of 2013 he began to experience a left shoulder discomfort. A nuclear perfusion study was done with as low risk and did not reveal any ischemia. Recently, the patient has experienced progressive development of shoulder arm discomfort as well as exertional shortness of breath and has experienced an episode of throat fullness. He was seen in the office yesterday and because of worrisome symptoms suggestive of progressive angina he presents today for definitive diagnostic cardiac catheterization.   PROCEDURE DESCRIPTION:   The patient was brought to the second floor  [place] [facility]iac cath lab in the postabsorptive state. He was premedicated with 2 mg of Versed and 50 micrograms of fentanyl. His right groin was prepped and shaved in usual sterile fashion. Xylocaine 1% was used  for local anesthesia. A 5  French sheath was inserted into the right femoral artery. Diagnostic coronary angiography was done utilizing 5 [place] LF4 and FR4 catheters. The right coronary catheter was also used for selective angiography into the left subclavian/internal memory artery system 2 to need for CABG revascularization surgery. A 5 French pigtail catheter was used for left ventriculography and due to the patient's previous abdominal aortic aneurysm grafting distal aortography was performed. Left ventriculography was done with 25 cc Omnipaque contrast in distal aortography with 28 cc of contrast. Hemostasis was obtained by direct manual compression. The patient tolerated the procedure well. Due to the  severity of the patient's coronary anatomy Dr. Bill Salinas is contacted to discuss the need for CABG surgery today following his elective diagnostic cardiac catheterization.   HEMODYNAMICS:    AO SYSTOLIC/AO DIASTOLIC: 124/54   LV SYSTOLIC/LV DIASTOLIC: 133/14  ANGIOGRAPHIC RESULTS:   1. Left main: 60 - 70% ostial and 90 - 95% distal stenosis.  2. LAD: 95+% ostial stenosis, 20% post Dx1, 20% mid 3. Left circumflex: minimal luminal irregularities and gave rise to large OM branch  4. Right coronary artery: large caliber dominant with 20% proximal narrowing, 20% prior to the acute margin,                                         40% prior to the PDA and 40-50% ostial PDA  5. Left subclavian/LIMA suitable for CABG. Calcified aortic knob  6.  Left ventriculography revealed normal L. the function without segmental wall motion abnormalities.  7. Distal aortography did not demonstrate any renal artery stenosis. The aortic graft was patent without significant stenosis.    IMPRESSION: Severe coronary obstructive disease with 60-70% ostial left main and 90-95% distal left main stenoses, greater than 95% ostial LAD stenosis and mild to moderate stenoses in the right coronary artery. Due to the severity of the patient's coronary anatomy Dr. Lavinia Sharps was called to review the angiographic findings with plans for CABG revascularization surgery to be done later today.  Lennette Bihari, MD, [facility] 01/15/2013 4:00 PM

## 2013-01-15 NOTE — Brief Op Note (Signed)
01/15/2013  7:15 PM  PATIENT:  Derek Wells  79 y.o. 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   Delay start of Pharmacological VTE agent (>24hrs) due to surgical blood loss or risk of bleeding: yes  PRE OP WEIGHT: 107 kg

## 2013-01-15 NOTE — Progress Notes (Signed)
Full consult note to follow. He has a several month history of exertion pain in shoulder and arm with negative stress test recently. He has continued to have these symptoms with minimal exertion. Cardiac cath today shows 90% distal left main and 99% ostial LAD stenosis. I agree with need for emergent coronary artery bypass. I discussed the operative procedure with the patient and wife including alternatives, benefits and risks; including but not limited to bleeding, blood transfusion, infection, stroke, myocardial infarction, graft failure, heart block requiring a permanent pacemaker, organ dysfunction, and death.  Derek Wells understands and agrees to proceed.  We will proceed with surgery emergently today.

## 2013-01-15 NOTE — H&P (Signed)
  Updated H&P:  See complete dictated note from office yesterday 01/14/2013. Pt presents today for diagnostic cardiac catheterization and possible PCI if indicated. No change in PEX. Pt started the metoprolol 25 mg bid, Labs drawn and CXR done; results pending. Discussed procedure with patient and wife. Plan later this am. KELLY,THOMAS A 01/15/2013 8:30 AM

## 2013-01-15 NOTE — OR Nursing (Signed)
45 minute call made to SICU at 1955; spoke with [facility] and updated that patient was off pump.  Judeth Cornfield to update family on patient status.

## 2013-01-15 NOTE — Preoperative (Signed)
Beta Blockers   Reason not to administer Beta Blockers:Not Applicable 

## 2013-01-15 NOTE — Anesthesia Procedure Notes (Signed)
Procedure Name: Intubation Date/Time: 01/15/2013 4:51 PM Performed by: Angelica Pou Pre-anesthesia Checklist: Patient identified, Timeout performed, Emergency Drugs available, Suction available and Patient being monitored Patient Re-evaluated:Patient Re-evaluated prior to inductionOxygen Delivery Method: Circle system utilized Preoxygenation: Pre-oxygenation with 100% oxygen Intubation Type: IV induction Ventilation: Two handed mask ventilation required and Oral airway inserted - appropriate to patient size Laryngoscope Size: Mac and 4 Grade View: Grade IV Tube type: Oral Tube size: 7.5 mm Number of attempts: 2 Airway Equipment and Method: Stylet,  Oral airway and Video-laryngoscopy Placement Confirmation: ETT inserted through vocal cords under direct vision,  breath sounds checked- equal and bilateral and positive ETCO2 Secured at: 23 cm Tube secured with: Tape Dental Injury: Teeth and Oropharynx as per pre-operative assessment  Comments: DLx1 with Mac 4, grade IV view. Mask ventilation. VLx1 with GS, grade 1 view, ETT passed through VC under vision, +etCO2, BS=B.

## 2013-01-15 NOTE — OR Nursing (Signed)
20 minute call made to 2300 at 2025; spoke with [facility].

## 2013-01-15 NOTE — Anesthesia Preprocedure Evaluation (Addendum)
Anesthesia Evaluation  Patient identified by MRN, date of birth, ID band Patient awake    Reviewed: Allergy & Precautions, H&P , NPO status , Patient's Chart, lab work & pertinent test results  History of Anesthesia Complications Negative for: history of anesthetic complications  Airway Mallampati: II TM Distance: >3 FB Neck ROM: Full    Dental  (+) Teeth Intact and Dental Advisory Given Chipped R upper:   Pulmonary Current Smoker,  breath sounds clear to auscultation  Pulmonary exam normal       Cardiovascular Exercise Tolerance: Poor hypertension, Pt. on medications + CAD Rhythm:Regular Rate:Normal  Hx aortobifemoral common iliac graft in 2010 by Dr Hart [place]   Neuro/Psych negative neurological ROS     GI/Hepatic negative GI ROS, Neg liver ROS,   Endo/Other  negative endocrine ROS  Renal/GU negative Renal ROS     Musculoskeletal   Abdominal   Peds  Hematology negative hematology ROS (+)   Anesthesia Other Findings   Reproductive/Obstetrics                        Anesthesia Physical Anesthesia Plan  ASA: IV and emergent  Anesthesia Plan: General   Post-op Pain Management:    Induction: Intravenous  Airway Management Planned:   Additional Equipment: Arterial line, CVP, PA Cath, 3D TEE and Ultrasound Guidance Line Placement  Intra-op Plan:   Post-operative Plan: Post-operative intubation/ventilation  Informed Consent: I have reviewed the patients History and Physical, chart, labs and discussed the procedure including the risks, benefits and alternatives for the proposed anesthesia with the patient or authorized representative who has indicated his/her understanding and acceptance.   Dental advisory given  Plan Discussed with: CRNA, Surgeon and Anesthesiologist  Anesthesia Plan Comments:         Anesthesia Quick Evaluation

## 2013-01-16 ENCOUNTER — Inpatient Hospital Stay (HOSPITAL_COMMUNITY): Payer: Medicare Other

## 2013-01-16 ENCOUNTER — Encounter (HOSPITAL_COMMUNITY): Payer: Self-pay | Admitting: Infectious Diseases

## 2013-01-16 LAB — CBC
HCT: 32.3 % — ABNORMAL LOW (ref 39.0–52.0)
HCT: 32.8 % — ABNORMAL LOW (ref 39.0–52.0)
Hemoglobin: 11.2 g/dL — ABNORMAL LOW (ref 13.0–17.0)
Hemoglobin: 11.3 g/dL — ABNORMAL LOW (ref 13.0–17.0)
MCH: 31.1 pg (ref 26.0–34.0)
MCV: 90 fL (ref 78.0–100.0)
MCV: 90.4 fL (ref 78.0–100.0)
RBC: 3.59 MIL/uL — ABNORMAL LOW (ref 4.22–5.81)
RBC: 3.63 MIL/uL — ABNORMAL LOW (ref 4.22–5.81)
WBC: 13.3 10*3/uL — ABNORMAL HIGH (ref 4.0–10.5)

## 2013-01-16 LAB — GLUCOSE, CAPILLARY
Glucose-Capillary: 118 mg/dL — ABNORMAL HIGH (ref 70–99)
Glucose-Capillary: 157 mg/dL — ABNORMAL HIGH (ref 70–99)
Glucose-Capillary: 144 mg/dL — ABNORMAL HIGH (ref 70–99)
Glucose-Capillary: 78 mg/dL (ref 70–99)

## 2013-01-16 LAB — POCT I-STAT 3, ART BLOOD GAS (G3+)
Acid-base deficit: 4 mmol/L — ABNORMAL HIGH (ref 0.0–2.0)
Acid-base deficit: 4 mmol/L — ABNORMAL HIGH (ref 0.0–2.0)
Bicarbonate: 22 mEq/L (ref 20.0–24.0)
O2 Saturation: 91 %
Patient temperature: 36.5
Patient temperature: 36.4
TCO2: 23 mmol/L (ref 0–100)
TCO2: 23 mmol/L (ref 0–100)
TCO2: 23 mmol/L (ref 0–100)
pCO2 arterial: 39.1 mmHg (ref 35.0–45.0)
pH, Arterial: 7.364 (ref 7.350–7.450)
pH, Arterial: 7.349 — ABNORMAL LOW (ref 7.350–7.450)
pH, Arterial: 7.341 — ABNORMAL LOW (ref 7.350–7.450)
pO2, Arterial: 64 mmHg — ABNORMAL LOW (ref 80.0–100.0)

## 2013-01-16 LAB — POCT I-STAT, CHEM 8
Calcium, Ion: 1.15 mmol/L (ref 1.13–1.30)
Glucose, Bld: 111 mg/dL — ABNORMAL HIGH (ref 70–99)
HCT: 35 % — ABNORMAL LOW (ref 39.0–52.0)
Hemoglobin: 11.9 g/dL — ABNORMAL LOW (ref 13.0–17.0)
TCO2: 25 mmol/L (ref 0–100)

## 2013-01-16 LAB — POCT I-STAT 4, (NA,K, GLUC, HGB,HCT)
Glucose, Bld: 121 mg/dL — ABNORMAL HIGH (ref 70–99)
Hemoglobin: 11.9 g/dL — ABNORMAL LOW (ref 13.0–17.0)
Potassium: 4.1 mEq/L (ref 3.5–5.1)
Sodium: 138 mEq/L (ref 135–145)

## 2013-01-16 LAB — BASIC METABOLIC PANEL
BUN: 16 mg/dL (ref 6–23)
CO2: 23 mEq/L (ref 19–32)
Chloride: 106 mEq/L (ref 96–112)
Creatinine, Ser: 0.89 mg/dL (ref 0.50–1.35)
Potassium: 3.9 mEq/L (ref 3.5–5.1)

## 2013-01-16 MED ORDER — INSULIN ASPART 100 UNIT/ML ~~LOC~~ SOLN
0.0000 [IU] | SUBCUTANEOUS | Status: DC
  Administered 2013-01-16 – 2013-01-17 (×2): 2 [IU] via SUBCUTANEOUS

## 2013-01-16 MED ORDER — METOPROLOL TARTRATE 25 MG/10 ML ORAL SUSPENSION
12.5000 mg | Freq: Two times a day (BID) | ORAL | Status: DC
  Filled 2013-01-16 (×3): qty 5

## 2013-01-16 MED ORDER — FUROSEMIDE 10 MG/ML IJ SOLN
40.0000 mg | Freq: Two times a day (BID) | INTRAMUSCULAR | Status: AC
  Administered 2013-01-16 (×2): 40 mg via INTRAVENOUS

## 2013-01-16 MED ORDER — METOPROLOL TARTRATE 25 MG PO TABS
25.0000 mg | ORAL_TABLET | Freq: Two times a day (BID) | ORAL | Status: DC
  Administered 2013-01-17: 25 mg via ORAL
  Filled 2013-01-16 (×3): qty 1

## 2013-01-16 MED ORDER — METOPROLOL TARTRATE 12.5 MG HALF TABLET
12.5000 mg | ORAL_TABLET | ORAL | Status: AC
  Administered 2013-01-16: 12.5 mg via ORAL
  Filled 2013-01-16: qty 1

## 2013-01-16 MED ORDER — INSULIN DETEMIR 100 UNIT/ML ~~LOC~~ SOLN
20.0000 [IU] | Freq: Every day | SUBCUTANEOUS | Status: DC
  Administered 2013-01-16: 20 [IU] via SUBCUTANEOUS
  Filled 2013-01-16 (×2): qty 0.2

## 2013-01-16 MED ORDER — ENOXAPARIN SODIUM 40 MG/0.4ML ~~LOC~~ SOLN
40.0000 mg | Freq: Every day | SUBCUTANEOUS | Status: DC
  Administered 2013-01-16 – 2013-01-18 (×3): 40 mg via SUBCUTANEOUS
  Filled 2013-01-16 (×4): qty 0.4

## 2013-01-16 MED ORDER — POTASSIUM CHLORIDE CRYS ER 20 MEQ PO TBCR
20.0000 meq | EXTENDED_RELEASE_TABLET | Freq: Two times a day (BID) | ORAL | Status: AC
  Administered 2013-01-16 (×2): 20 meq via ORAL
  Filled 2013-01-16 (×2): qty 1

## 2013-01-16 NOTE — Consult Note (Signed)
[address]       [place] [address]             [phone]      Reason for Consult: High grade left main and two-vessel coronary disease Referring Physician:  Dr. Nicki Guadalajara  Derek Wells is an 79 y.o. male.  HPI:  I was called to the Cath Lab by Dr. Tresa Endo to evaluate Derek Wells for coronary bypass surgery. He is a 79 year old gentleman with history of hypertension and hyperlipidemia as well as prior abdominal aortic aneurysm repair who has been having exertional chest discomfort as well as pain in his neck, left shoulder, and arm since last fall. He underwent a nuclear stress test which was negative for ischemia. He continued to have the symptoms and actually took a trip to [place] for 2 weeks having these symptoms prior to departure and during the trip. He has had continued progression of symptoms to the point where he is getting them with mild exertion such as walking at a normal pace. He returned to see Dr. Tresa Endo who thought that it was best to proceed with cardiac catheterization even though he had a negative stress test several months ago. Elective cardiac catheterization today showed a 60-70% ostial left main and 90% distal left main stenosis. The LAD had a 99% ostial stenosis. The right coronary was diffusely irregular with 50% ostial stenosis of the posterior descending branch. Left ventricular ejection fraction was normal.  Past Medical History  Diagnosis Date  . Hypertension   . Coronary artery disease     Past Surgical History  Procedure Laterality Date  . Vascular surgery      History reviewed. No pertinent family history.  Social History:  reports that he has been smoking Cigars.  He does not have any smokeless tobacco history on file. His alcohol and drug histories are not on file.  Allergies:  Allergies  Allergen Reactions  . Penicillins Rash    Medications:  I have reviewed the patient's current medications. Prior to Admission:  Prescriptions  prior to admission  Medication Sig Dispense Refill  . aspirin EC 81 MG tablet Take 81 mg by mouth daily.      Marland Kitchen atorvastatin (LIPITOR) 40 MG tablet Take 40 mg by mouth daily.      . fish oil-omega-3 fatty acids 1000 MG capsule Take 1 g by mouth daily.      . Multiple Vitamin (MULTIVITAMIN WITH MINERALS) TABS Take 1 tablet by mouth daily.      . niacin (NIASPAN) 500 MG CR tablet Take 500 mg by mouth at bedtime.      . valsartan-hydrochlorothiazide (DIOVAN-HCT) 320-12.5 MG per tablet Take 1 tablet by mouth daily.       Scheduled: . acetaminophen  1,000 mg Oral Q6H   Or  . acetaminophen (TYLENOL) oral liquid 160 mg/5 mL  975 mg Per Tube Q6H  . aspirin EC  325 mg Oral Daily   Or  . aspirin  324 mg Per Tube Daily  . atorvastatin  40 mg Oral Daily  . bisacodyl  10 mg Oral Daily   Or  . bisacodyl  10 mg Rectal Daily  . docusate sodium  200 mg Oral Daily  . enoxaparin (LOVENOX) injection  40 mg Subcutaneous QHS  . famotidine (PEPCID) IV  20 mg Intravenous Q12H  . furosemide  40 mg Intravenous BID  . insulin aspart  0-24 Units Subcutaneous Q4H  . insulin detemir  20 Units  Subcutaneous Q1200  . insulin regular  0-10 Units Intravenous TID WC  . metoCLOPramide (REGLAN) injection  10 mg Intravenous Q6H  . metoprolol tartrate  25 mg Oral BID   Or  . metoprolol tartrate  12.5 mg Per Tube BID  . multivitamin with minerals  1 tablet Oral Daily  . niacin  500 mg Oral QHS  . [START ON 01/17/2013] omega-3 acid ethyl esters  1 g Oral Daily  . [START ON 01/17/2013] pantoprazole  40 mg Oral Daily  . potassium chloride  20 mEq Oral BID  . sodium chloride  3 mL Intravenous Q12H   Continuous: . sodium chloride 20 mL/hr at 01/16/13 0700  . sodium chloride    . sodium chloride    . dexmedetomidine Stopped (01/16/13 0000)  . DOPamine    . insulin (NOVOLIN-R) infusion 1.9 Units/hr (01/16/13 0900)  . lactated ringers 20 mL/hr at 01/16/13 0700  . nitroGLYCERIN 0 mcg/min (01/15/13 2115)  . phenylephrine  (NEO-SYNEPHRINE) Adult infusion Stopped (01/16/13 0800)   ZOX:[id number], midazolam, morphine injection, ondansetron (ZOFRAN) IV, oxyCODONE, sodium chloride Anti-infectives   Start     Dose/Rate Route Frequency Ordered Stop   01/16/13 1000  levofloxacin (LEVAQUIN) IVPB 750 mg     750 mg 100 mL/hr over 90 Minutes Intravenous Every 24 hours 01/15/13 2111 01/16/13 1054   01/16/13 0330  vancomycin (VANCOCIN) IVPB 1000 mg/200 mL premix     1,000 mg 200 mL/hr over 60 Minutes Intravenous  Once 01/15/13 2111 01/16/13 0406   01/15/13 1500  vancomycin (VANCOCIN) 1,500 mg in sodium chloride 0.9 % 250 mL IVPB     1,500 mg 125 mL/hr over 120 Minutes Intravenous To Surgery 01/15/13 1455 01/15/13 1630   01/15/13 1500  levofloxacin (LEVAQUIN) IVPB 500 mg  Status:  Discontinued     500 mg 100 mL/hr over 60 Minutes Intravenous To Surgery 01/15/13 1455 01/15/13 1456   01/15/13 1500  levofloxacin (LEVAQUIN) IVPB 500 mg     500 mg 100 mL/hr over 60 Minutes Intravenous To Surgery 01/15/13 1454 01/15/13 1700      Results for orders placed during the hospital encounter of 01/15/13 (from the past 48 hour(s))  BASIC METABOLIC PANEL     Status: Abnormal   Collection Time    01/15/13  7:34 AM      Result Value Range   Sodium 139  135 - 145 mEq/L   Potassium 3.9  3.5 - 5.1 mEq/L   Chloride 103  96 - 112 mEq/L   CO2 26  19 - 32 mEq/L   Glucose, Bld 116 (*) 70 - 99 mg/dL   BUN 20  6 - 23 mg/dL   Creatinine, Ser 1.19  0.50 - 1.35 mg/dL   Calcium 9.1  8.4 - 14.7 mg/dL   GFR calc non Af Amer 69 (*) >90 mL/min   GFR calc Af Amer 80 (*) >90 mL/min   Comment:            The eGFR has been calculated     using the CKD EPI equation.     This calculation has not been     validated in all clinical     situations.     eGFR's persistently     <90 mL/min signify     possible Chronic Kidney Disease.  CBC     Status: None   Collection Time    01/15/13  7:34 AM      Result Value Range   WBC 7.4  4.0 - 10.5 K/uL     RBC 4.68  4.22 - 5.81 MIL/uL   Hemoglobin 14.7  13.0 - 17.0 g/dL   HCT 16.1  09.6 - 04.5 %   MCV 88.9  78.0 - 100.0 fL   MCH 31.4  26.0 - 34.0 pg   MCHC 35.3  30.0 - 36.0 g/dL   RDW 40.9  81.1 - 91.4 %   Platelets 223  150 - 400 K/uL  PROTIME-INR     Status: None   Collection Time    01/15/13  7:34 AM      Result Value Range   Prothrombin Time 12.9  11.6 - 15.2 seconds   INR 0.98  0.00 - 1.49  CBC     Status: None   Collection Time    01/15/13  3:02 PM      Result Value Range   WBC 8.8  4.0 - 10.5 K/uL   RBC 4.89  4.22 - 5.81 MIL/uL   Hemoglobin 15.5  13.0 - 17.0 g/dL   HCT 78.2  95.6 - 21.3 %   MCV 89.2  78.0 - 100.0 fL   MCH 31.7  26.0 - 34.0 pg   MCHC 35.6  30.0 - 36.0 g/dL   RDW 08.6  57.8 - 46.9 %   Platelets 163  150 - 400 K/uL   Comment: DELTA CHECK NOTED     REPEATED TO VERIFY     SPECIMEN CHECKED FOR CLOTS  BASIC METABOLIC PANEL     Status: Abnormal   Collection Time    01/15/13  3:02 PM      Result Value Range   Sodium 137  135 - 145 mEq/L   Potassium 4.1  3.5 - 5.1 mEq/L   Chloride 102  96 - 112 mEq/L   CO2 23  19 - 32 mEq/L   Glucose, Bld 97  70 - 99 mg/dL   BUN 17  6 - 23 mg/dL   Creatinine, Ser 6.29  0.50 - 1.35 mg/dL   Calcium 9.1  8.4 - 52.8 mg/dL   GFR calc non Af Amer 78 (*) >90 mL/min   GFR calc Af Amer >90  >90 mL/min   Comment:            The eGFR has been calculated     using the CKD EPI equation.     This calculation has not been     validated in all clinical     situations.     eGFR's persistently     <90 mL/min signify     possible Chronic Kidney Disease.  TYPE AND SCREEN     Status: None   Collection Time    01/15/13  3:05 PM      Result Value Range   ABO/RH(D) A POS     Antibody Screen NEG     Sample Expiration 01/18/2013     Unit Number [id number]     Blood Component Type RED CELLS,LR     Unit division 00     Status of Unit ALLOCATED     Transfusion Status OK TO TRANSFUSE     Crossmatch Result Compatible     Unit  Number [id number]     Blood Component Type RED CELLS,LR     Unit division 00     Status of Unit ALLOCATED     Transfusion Status OK TO TRANSFUSE     Crossmatch Result Compatible     Unit Number [id number]  Blood Component Type RED CELLS,LR     Unit division 00     Status of Unit ALLOCATED     Transfusion Status OK TO TRANSFUSE     Crossmatch Result Compatible     Unit Number [id number]     Blood Component Type RED CELLS,LR     Unit division 00     Status of Unit ALLOCATED     Transfusion Status OK TO TRANSFUSE     Crossmatch Result Compatible    PREPARE RBC (CROSSMATCH)     Status: None   Collection Time    01/15/13  3:53 PM      Result Value Range   Order Confirmation ORDER PROCESSED BY BLOOD BANK    POCT I-STAT 4, (NA,K, GLUC, HGB,HCT)     Status: Abnormal   Collection Time    01/15/13  5:02 PM      Result Value Range   Sodium 141  135 - 145 mEq/L   Potassium 3.8  3.5 - 5.1 mEq/L   Glucose, Bld 104 (*) 70 - 99 mg/dL   HCT 11.9  14.7 - 82.9 %   Hemoglobin 14.3  13.0 - 17.0 g/dL  POCT I-STAT 4, (NA,K, GLUC, HGB,HCT)     Status: None   Collection Time    01/15/13  6:11 PM      Result Value Range   Sodium 140  135 - 145 mEq/L   Potassium 4.0  3.5 - 5.1 mEq/L   Glucose, Bld 98  70 - 99 mg/dL   HCT 56.2  13.0 - 86.5 %   Hemoglobin 13.3  13.0 - 17.0 g/dL  POCT I-STAT 3, BLOOD GAS (G3+)     Status: Abnormal   Collection Time    01/15/13  6:35 PM      Result Value Range   pH, Arterial 7.308 (*) 7.350 - 7.450   pCO2 arterial 48.6 (*) 35.0 - 45.0 mmHg   pO2, Arterial 361.0 (*) 80.0 - 100.0 mmHg   Bicarbonate 24.3 (*) 20.0 - 24.0 mEq/L   TCO2 26  0 - 100 mmol/L   O2 Saturation 100.0     Acid-base deficit 2.0  0.0 - 2.0 mmol/L   Sample type ARTERIAL    POCT I-STAT 4, (NA,K, GLUC, HGB,HCT)     Status: Abnormal   Collection Time    01/15/13  6:38 PM      Result Value Range   Sodium 131 (*) 135 - 145 mEq/L   Potassium 4.8  3.5 - 5.1 mEq/L   Glucose, Bld 87  70  - 99 mg/dL   HCT 78.4 (*) 69.6 - 29.5 %   Hemoglobin 9.2 (*) 13.0 - 17.0 g/dL  PLATELET COUNT     Status: Abnormal   Collection Time    01/15/13  7:00 PM      Result Value Range   Platelets 144 (*) 150 - 400 K/uL   Comment: RESULT CALLED TO, READ BACK BY AND VERIFIED WITH:     S PERKINS RN 4.25.14 AT 1917 BY ROMEROJ  HEMOGLOBIN AND HEMATOCRIT, BLOOD     Status: Abnormal   Collection Time    01/15/13  7:00 PM      Result Value Range   Hemoglobin 10.5 (*) 13.0 - 17.0 g/dL   Comment: RESULT CALLED TO, READ BACK BY AND VERIFIED WITH:     S PERKINS RN 4.25.14 AT 1917 BY ROMEROJ   HCT 29.4 (*) 39.0 - 52.0 %   Comment: RESULT  CALLED TO, READ BACK BY AND VERIFIED WITH:     S PERKINS RN 4.25.14 AT 1917 BY ROMEROJ  POCT I-STAT 4, (NA,K, GLUC, HGB,HCT)     Status: Abnormal   Collection Time    01/15/13  7:08 PM      Result Value Range   Sodium 140  135 - 145 mEq/L   Potassium 4.8  3.5 - 5.1 mEq/L   Glucose, Bld 98  70 - 99 mg/dL   HCT 21.3 (*) 08.6 - 57.8 %   Hemoglobin 10.2 (*) 13.0 - 17.0 g/dL  POCT I-STAT 4, (NA,K, GLUC, HGB,HCT)     Status: Abnormal   Collection Time    01/15/13  8:01 PM      Result Value Range   Sodium 137  135 - 145 mEq/L   Potassium 4.7  3.5 - 5.1 mEq/L   Glucose, Bld 110 (*) 70 - 99 mg/dL   HCT 46.9 (*) 62.9 - 52.8 %   Hemoglobin 9.9 (*) 13.0 - 17.0 g/dL  POCT I-STAT 3, BLOOD GAS (G3+)     Status: Abnormal   Collection Time    01/15/13  9:11 PM      Result Value Range   pH, Arterial 7.364  7.350 - 7.450   pCO2 arterial 39.1  35.0 - 45.0 mmHg   pO2, Arterial 64.0 (*) 80.0 - 100.0 mmHg   Bicarbonate 22.3  20.0 - 24.0 mEq/L   TCO2 23  0 - 100 mmol/L   O2 Saturation 91.0     Acid-base deficit 3.0 (*) 0.0 - 2.0 mmol/L   Patient temperature 98.6 F     Collection site RADIAL, ALLEN'S TEST ACCEPTABLE     Drawn by Operator     Sample type ARTERIAL    POCT I-STAT 4, (NA,K, GLUC, HGB,HCT)     Status: Abnormal   Collection Time    01/15/13  9:17 PM       Result Value Range   Sodium 138  135 - 145 mEq/L   Potassium 4.1  3.5 - 5.1 mEq/L   Glucose, Bld 121 (*) 70 - 99 mg/dL   HCT 41.3 (*) 24.4 - 01.0 %   Hemoglobin 11.9 (*) 13.0 - 17.0 g/dL  CBC     Status: Abnormal   Collection Time    01/15/13  9:18 PM      Result Value Range   WBC 14.5 (*) 4.0 - 10.5 K/uL   RBC 3.62 (*) 4.22 - 5.81 MIL/uL   Hemoglobin 11.4 (*) 13.0 - 17.0 g/dL   HCT 27.2 (*) 53.6 - 64.4 %   MCV 89.5  78.0 - 100.0 fL   MCH 31.5  26.0 - 34.0 pg   MCHC 35.2  30.0 - 36.0 g/dL   RDW 03.4  74.2 - 59.5 %   Platelets 136 (*) 150 - 400 K/uL  PROTIME-INR     Status: Abnormal   Collection Time    01/15/13  9:18 PM      Result Value Range   Prothrombin Time 16.5 (*) 11.6 - 15.2 seconds   INR 1.37  0.00 - 1.49  APTT     Status: Abnormal   Collection Time    01/15/13  9:18 PM      Result Value Range   aPTT 39 (*) 24 - 37 seconds   Comment:            IF BASELINE aPTT IS ELEVATED,     SUGGEST PATIENT RISK ASSESSMENT  BE USED TO DETERMINE APPROPRIATE     ANTICOAGULANT THERAPY.  GLUCOSE, CAPILLARY     Status: Abnormal   Collection Time    01/15/13 10:22 PM      Result Value Range   Glucose-Capillary 118 (*) 70 - 99 mg/dL  GLUCOSE, CAPILLARY     Status: Abnormal   Collection Time    01/15/13 11:22 PM      Result Value Range   Glucose-Capillary 138 (*) 70 - 99 mg/dL  GLUCOSE, CAPILLARY     Status: Abnormal   Collection Time    01/16/13 12:08 AM      Result Value Range   Glucose-Capillary 110 (*) 70 - 99 mg/dL  GLUCOSE, CAPILLARY     Status: Abnormal   Collection Time    01/16/13  1:11 AM      Result Value Range   Glucose-Capillary 157 (*) 70 - 99 mg/dL  POCT I-STAT 3, BLOOD GAS (G3+)     Status: Abnormal   Collection Time    01/16/13  2:02 AM      Result Value Range   pH, Arterial 7.349 (*) 7.350 - 7.450   pCO2 arterial 39.4  35.0 - 45.0 mmHg   pO2, Arterial 74.0 (*) 80.0 - 100.0 mmHg   Bicarbonate 21.8  20.0 - 24.0 mEq/L   TCO2 23  0 - 100 mmol/L   O2  Saturation 94.0     Acid-base deficit 4.0 (*) 0.0 - 2.0 mmol/L   Patient temperature 36.5 C     Collection site RADIAL, ALLEN'S TEST ACCEPTABLE     Drawn by VENIPUNCTURE     Sample type ARTERIAL    GLUCOSE, CAPILLARY     Status: Abnormal   Collection Time    01/16/13  2:04 AM      Result Value Range   Glucose-Capillary 143 (*) 70 - 99 mg/dL  GLUCOSE, CAPILLARY     Status: Abnormal   Collection Time    01/16/13  3:11 AM      Result Value Range   Glucose-Capillary 127 (*) 70 - 99 mg/dL  POCT I-STAT 3, BLOOD GAS (G3+)     Status: Abnormal   Collection Time    01/16/13  3:12 AM      Result Value Range   pH, Arterial 7.341 (*) 7.350 - 7.450   pCO2 arterial 40.4  35.0 - 45.0 mmHg   pO2, Arterial 98.0  80.0 - 100.0 mmHg   Bicarbonate 22.0  20.0 - 24.0 mEq/L   TCO2 23  0 - 100 mmol/L   O2 Saturation 97.0     Acid-base deficit 4.0 (*) 0.0 - 2.0 mmol/L   Patient temperature 36.4 C     Collection site RADIAL, ALLEN'S TEST ACCEPTABLE     Drawn by Nurse     Sample type ARTERIAL    SURGICAL PCR SCREEN     Status: None   Collection Time    01/16/13  3:20 AM      Result Value Range   MRSA, PCR NEGATIVE  NEGATIVE   Staphylococcus aureus NEGATIVE  NEGATIVE   Comment:            The Xpert SA Assay (FDA     approved for NASAL specimens     in patients over 36 years of age),     is one component of     a comprehensive surveillance     program.  Test performance has     been validated by [facility] for patients greater     than or equal to 72 year old.     It is not intended     to diagnose infection nor to     guide or monitor treatment.  CBC     Status: Abnormal   Collection Time    01/16/13  3:55 AM      Result Value Range   WBC 13.3 (*) 4.0 - 10.5 K/uL   RBC 3.59 (*) 4.22 - 5.81 MIL/uL   Hemoglobin 11.2 (*) 13.0 - 17.0 g/dL   HCT 16.1 (*) 09.6 - 04.5 %   MCV 90.0  78.0 - 100.0 fL   MCH 31.2  26.0 - 34.0 pg   MCHC 34.7  30.0 - 36.0 g/dL   RDW 40.9  81.1 - 91.4 %    Platelets 143 (*) 150 - 400 K/uL  BASIC METABOLIC PANEL     Status: Abnormal   Collection Time    01/16/13  3:55 AM      Result Value Range   Sodium 138  135 - 145 mEq/L   Potassium 3.9  3.5 - 5.1 mEq/L   Chloride 106  96 - 112 mEq/L   CO2 23  19 - 32 mEq/L   Glucose, Bld 152 (*) 70 - 99 mg/dL   BUN 16  6 - 23 mg/dL   Creatinine, Ser 7.82  0.50 - 1.35 mg/dL   Calcium 7.9 (*) 8.4 - 10.5 mg/dL   GFR calc non Af Amer 80 (*) >90 mL/min   GFR calc Af Amer >90  >90 mL/min   Comment:            The eGFR has been calculated     using the CKD EPI equation.     This calculation has not been     validated in all clinical     situations.     eGFR's persistently     <90 mL/min signify     possible Chronic Kidney Disease.  GLUCOSE, CAPILLARY     Status: Abnormal   Collection Time    01/16/13  3:57 AM      Result Value Range   Glucose-Capillary 144 (*) 70 - 99 mg/dL  GLUCOSE, CAPILLARY     Status: Abnormal   Collection Time    01/16/13  6:10 AM      Result Value Range   Glucose-Capillary 127 (*) 70 - 99 mg/dL  GLUCOSE, CAPILLARY     Status: Abnormal   Collection Time    01/16/13  6:55 AM      Result Value Range   Glucose-Capillary 128 (*) 70 - 99 mg/dL  GLUCOSE, CAPILLARY     Status: None   Collection Time    01/16/13  7:43 AM      Result Value Range   Glucose-Capillary 78  70 - 99 mg/dL  GLUCOSE, CAPILLARY     Status: Abnormal   Collection Time    01/16/13  8:53 AM      Result Value Range   Glucose-Capillary 124 (*) 70 - 99 mg/dL  GLUCOSE, CAPILLARY     Status: Abnormal   Collection Time    01/16/13  9:35 AM      Result Value Range   Glucose-Capillary 121 (*) 70 - 99 mg/dL  GLUCOSE, CAPILLARY     Status: Abnormal   Collection Time    01/16/13 11:42 AM      Result Value Range   Glucose-Capillary 124 (*) 70 -  99 mg/dL  GLUCOSE, CAPILLARY     Status: Abnormal   Collection Time    01/16/13  3:15 PM      Result Value Range   Glucose-Capillary 110 (*) 70 - 99 mg/dL  POCT  I-STAT, CHEM 8     Status: Abnormal   Collection Time    01/16/13  3:24 PM      Result Value Range   Sodium 139  135 - 145 mEq/L   Potassium 3.9  3.5 - 5.1 mEq/L   Chloride 100  96 - 112 mEq/L   BUN 14  6 - 23 mg/dL   Creatinine, Ser 1.19  0.50 - 1.35 mg/dL   Glucose, Bld 147 (*) 70 - 99 mg/dL   Calcium, Ion 8.29  5.62 - 1.30 mmol/L   TCO2 25  0 - 100 mmol/L   Hemoglobin 11.9 (*) 13.0 - 17.0 g/dL   HCT 13.0 (*) 86.5 - 78.4 %  CBC     Status: Abnormal   Collection Time    01/16/13  3:25 PM      Result Value Range   WBC 12.8 (*) 4.0 - 10.5 K/uL   RBC 3.63 (*) 4.22 - 5.81 MIL/uL   Hemoglobin 11.3 (*) 13.0 - 17.0 g/dL   HCT 69.6 (*) 29.5 - 28.4 %   MCV 90.4  78.0 - 100.0 fL   MCH 31.1  26.0 - 34.0 pg   MCHC 34.5  30.0 - 36.0 g/dL   RDW 13.2  44.0 - 10.2 %   Platelets 141 (*) 150 - 400 K/uL    Dg Chest 2 View  01/15/2013  *RADIOLOGY REPORT*  Clinical Data: Chest pain.  Preprocedural radiograph.  Cardiac catheterization.  CHEST - 2 VIEW  Comparison: 12/09/2008.  Findings:  Cardiopericardial silhouette within normal limits. Mediastinal contours normal. Trachea midline.  No airspace disease or effusion.  Calcification in the right upper chest suggests pleural plaque although this could be external to the chest.  This was not well visualized on prior exam.  IMPRESSION: No active cardiopulmonary disease.  Probable small pleural plaque at the right apex.   Original Report Authenticated By: Andreas Newport, M.D.    Dg Chest Portable 1 View In Am  01/16/2013  *RADIOLOGY REPORT*  Clinical Data: Postop, chest tube  PORTABLE CHEST - 1 VIEW  Comparison: 01/15/2013  Findings: Grossly unchanged cardiac silhouette and mediastinal contours post median sternotomy and CABG.  Interval extubation of removal of enteric tube.  Grossly unchanged position every support apparatus.  No pneumothorax.  Lung volumes remain reduced with persistent mild elevation of the right hemidiaphragm.  Minimal improved aeration of  the bilateral lung bases with persistent bibasilar opacities. Unchanged granuloma overlying the peripheral aspect the right upper lung.  No new focal airspace opacities.  No definite pleural effusion.  Unchanged bones.  IMPRESSION: 1.  Interval extubation and removal of enteric tube.  Otherwise, stable positioning of support apparatus.  No pneumothorax. 2.  Improved aeration of the lung bases with persistent bibasilar atelectasis. 3.  Pulmonary venous congestion without frank evidence of edema.   Original Report Authenticated By: Tacey Ruiz, MD    Dg Chest Portable 1 View  01/15/2013  *RADIOLOGY REPORT*  Clinical Data: Postoperative CABG.  PORTABLE CHEST - 1 VIEW  Comparison: 01/15/2013  Findings: Since the previous study, there are interval postoperative changes with sternotomy wires and vascular markers in the mediastinum.  An endotracheal tube has been placed with tip about 3.8 cm above the carina.  Swan-Ganz catheter tip is in the location of the pulmonary outflow tract.  Enteric tube has been placed.  The tip is not visualized below the field of view but is below the left hemidiaphragm.  Bilateral chest tubes and mediastinal drain in place.  Right central venous catheter with tip overlying the lower SVC region.  No visible pneumothorax.  Mild atelectasis in the lung bases.  No pleural effusions.  Mediastinal contours appear intact.  IMPRESSION: Interval postoperative changes in the mediastinum.  Appliances appear to be in satisfactory location.  Atelectasis in the lung bases.   Original Report Authenticated By: Burman Nieves, M.D.     Review of Systems  Constitutional: Positive for malaise/fatigue.  HENT: Negative.   Eyes: Negative.   Respiratory: Negative for cough, sputum production and shortness of breath.   Cardiovascular: Positive for chest pain. Negative for orthopnea, leg swelling and PND.       Burning throat pain, dull aching pain in left shoulder and arm.  Gastrointestinal: Negative.    Genitourinary: Negative.   Musculoskeletal: Negative.   Neurological: Negative.   Endo/Heme/Allergies: Negative.   Psychiatric/Behavioral: Negative.    Blood pressure 99/47, pulse 72, temperature 98.8 F (37.1 C), temperature source Oral, resp. rate 11, height 6\' 1"  (1.854 m), weight 112.2 kg (247 lb 5.7 oz), SpO2 96.00%. Physical Exam  Constitutional: He is oriented to person, place, and time. He appears well-developed and well-nourished. No distress.  HENT:  Head: Normocephalic and atraumatic.  Mouth/Throat: Oropharynx is clear and moist.  Eyes: EOM are normal. Pupils are equal, round, and reactive to light.  Neck: Normal range of motion. Neck supple. No JVD present. No thyromegaly present.  Cardiovascular: Normal rate, regular rhythm and intact distal pulses.  Exam reveals no gallop and no friction rub.   No murmur heard. Respiratory: Effort normal and breath sounds normal. No respiratory distress. He has no rales.  GI: Soft. Bowel sounds are normal. He exhibits no distension and no mass. There is no tenderness.  Musculoskeletal: Normal range of motion. He exhibits no edema.  Lymphadenopathy:    He has no cervical adenopathy.  Neurological: He is alert and oriented to person, place, and time. He has normal strength. No cranial nerve deficit or sensory deficit.  Skin: Skin is warm and dry.  Psychiatric: He has a normal mood and affect.   HEMODYNAMICS:      AO SYSTOLIC/AO DIASTOLIC: 124/54    LV SYSTOLIC/LV DIASTOLIC: 133/14  ANGIOGRAPHIC RESULTS:   1. Left main: 60 - 70% ostial and 90 - 95% distal stenosis.   2. LAD: 95+% ostial stenosis, 20% post Dx1, 20% mid 3. Left circumflex: minimal luminal irregularities and gave rise to large OM branch   4. Right coronary artery: large caliber dominant with 20% proximal narrowing, 20% prior to the acute margin,                                         40% prior to the PDA and 40-50% ostial PDA  5. Left subclavian/LIMA suitable for  CABG. Calcified aortic knob  6.  Left ventriculography revealed normal L. the function without segmental wall motion abnormalities.  7. Distal aortography did not demonstrate any renal artery stenosis. The aortic graft was patent without significant stenosis.  IMPRESSION: Severe coronary obstructive disease with 60-70% ostial left main and 90-95% distal left main stenoses, greater than 95% ostial LAD stenosis and  mild to moderate stenoses in the right coronary artery.    Assessment/Plan:  The patient is a 79 year old gentleman who is still very active and presents with worsening anginal- type symptoms and catheterization shows high-grade left main and ostial LAD stenosis with moderate right coronary stenosis. I agree that coronary bypass graft surgery is the best treatment to prevent further ischemia and infarction, and improve his quality of life, and prevent sudden death. I discussed the operative procedure with the patient and his wife including alternatives, benefits and risks; including but not limited to bleeding, blood transfusion, infection, stroke, myocardial infarction, graft failure, heart block requiring a permanent pacemaker, organ dysfunction, and death.  Derek Wells understands and agrees to proceed.  We will schedule surgery for today.  Raine Blodgett K 01/16/2013, 4:07 PM

## 2013-01-16 NOTE — Progress Notes (Signed)
1 Day Post-Op Procedure(s) (LRB): Coronary Artery Bypass Graft times three using Right Greater Saphenous Vein Graft harvested endoscopically and Left Internal Mammary Artery and (N/A) INTRAOPERATIVE TRANSESOPHAGEAL ECHOCARDIOGRAM Subjective: Complains of pain  Objective: Vital signs in last 24 hours: Temp:  [96.1 F (35.6 C)-97.9 F (36.6 C)] 97.9 F (36.6 C) (04/26 0700) Pulse Rate:  [58-91] 90 (04/26 0900) Cardiac Rhythm:  [-] Atrial paced (04/26 0800) Resp:  [11-21] 13 (04/26 0900) BP: (81-117)/(53-71) 106/62 mmHg (04/26 0500) SpO2:  [95 %-99 %] 96 % (04/26 0900) Arterial Line BP: (85-130)/(45-64) 122/54 mmHg (04/26 0900) FiO2 (%):  [40 %-60 %] 40 % (04/26 0141) Weight:  [112.2 kg (247 lb 5.7 oz)] 112.2 kg (247 lb 5.7 oz) (04/26 0500)  Hemodynamic parameters for last 24 hours: PAP: (23-38)/(12-22) 35/22 mmHg CO:  [4.6 L/min-7.5 L/min] 7.5 L/min CI:  [2 L/min/m2-3.3 L/min/m2] 3.3 L/min/m2  Intake/Output from previous day: 04/25 0701 - 04/26 0700 In: 5732.5 [P.O.:120; I.V.:3186.5; Blood:726; IV Piggyback:1700] Out: 4210 [Urine:2500; Blood:1250; Chest Tube:460] Intake/Output this shift: Total I/O In: 82.4 [I.V.:82.4] Out: 30 [Urine:30]  General appearance: alert and cooperative Neurologic: intact Heart: regular rate and rhythm, S1, S2 normal, no murmur, click, rub or gallop Lungs: clear to auscultation bilaterally Wound: dressing dry  Lab Results:  Recent Labs  01/15/13 2118 01/16/13 0355  WBC 14.5* 13.3*  HGB 11.4* 11.2*  HCT 32.4* 32.3*  PLT 136* 143*   BMET:  Recent Labs  01/15/13 1502  01/15/13 2117 01/16/13 0355  NA 137  < > 138 138  K 4.1  < > 4.1 3.9  CL 102  --   --  106  CO2 23  --   --  23  GLUCOSE 97  < > 121* 152*  BUN 17  --   --  16  CREATININE 0.94  --   --  0.89  CALCIUM 9.1  --   --  7.9*  < > = values in this interval not displayed.  PT/INR:  Recent Labs  01/15/13 2118  LABPROT 16.5*  INR 1.37   ABG    Component Value  Date/Time   PHART 7.341* 01/16/2013 0312   HCO3 22.0 01/16/2013 0312   TCO2 23 01/16/2013 0312   ACIDBASEDEF 4.0* 01/16/2013 0312   O2SAT 97.0 01/16/2013 0312   CBG (last 3)   Recent Labs  01/16/13 0610 01/16/13 0655 01/16/13 0743  GLUCAP 127* 128* 78   CXR: ok  ECG:  NSR, no acute change  Assessment/Plan: S/P Procedure(s) (LRB): Coronary Artery Bypass Graft times three using Right Greater Saphenous Vein Graft harvested endoscopically and Left Internal Mammary Artery and (N/A) INTRAOPERATIVE TRANSESOPHAGEAL ECHOCARDIOGRAM Mobilize Diuresis Diabetes control d/c tubes/lines Continue foley due to diuresing patient and patient in ICU See progression orders   LOS: 1 day    Tulsi Crossett K 01/16/2013

## 2013-01-16 NOTE — Progress Notes (Signed)
Patient ID: Derek Wells, male   DOB: February 25, 1933, 79 y.o.   MRN: [id number]  SICU Evening Rounds:  Stable day. No complaints. Ambulated.

## 2013-01-16 NOTE — Progress Notes (Signed)
[facility] Progress Note  Subjective:  Alert and communicative.  Objective:   Vital Signs in the last 24 hours: Temp:  [96.1 F (35.6 C)-97.9 F (36.6 C)] 97.9 F (36.6 C) (04/26 0700) Pulse Rate:  [58-91] 90 (04/26 0900) Resp:  [11-21] 13 (04/26 0900) BP: (81-117)/(53-71) 106/62 mmHg (04/26 0500) SpO2:  [95 %-99 %] 96 % (04/26 0900) Arterial Line BP: (85-130)/(45-64) 122/54 mmHg (04/26 0900) FiO2 (%):  [40 %-60 %] 40 % (04/26 0141) Weight:  [112.2 kg (247 lb 5.7 oz)] 112.2 kg (247 lb 5.7 oz) (04/26 0500)  Intake/Output from previous day: 04/25 0701 - 04/26 0700 In: 5732.5 [P.O.:120; I.V.:3186.5; Blood:726; IV Piggyback:1700] Out: 4210 [Urine:2500; Blood:1250; Chest Tube:460]  Scheduled: . acetaminophen  1,000 mg Oral Q6H   Or  . acetaminophen (TYLENOL) oral liquid 160 mg/5 mL  975 mg Per Tube Q6H  . aspirin EC  325 mg Oral Daily   Or  . aspirin  324 mg Per Tube Daily  . atorvastatin  40 mg Oral Daily  . bisacodyl  10 mg Oral Daily   Or  . bisacodyl  10 mg Rectal Daily  . docusate sodium  200 mg Oral Daily  . famotidine (PEPCID) IV  20 mg Intravenous Q12H  . insulin regular  0-10 Units Intravenous TID WC  . levofloxacin (LEVAQUIN) IV  750 mg Intravenous Q24H  . metoCLOPramide (REGLAN) injection  10 mg Intravenous Q6H  . metoprolol tartrate  12.5 mg Oral BID   Or  . metoprolol tartrate  12.5 mg Per Tube BID  . multivitamin with minerals  1 tablet Oral Daily  . niacin  500 mg Oral QHS  . [START ON 01/17/2013] omega-3 acid ethyl esters  1 g Oral Daily  . [START ON 01/17/2013] pantoprazole  40 mg Oral Daily  . sodium chloride  3 mL Intravenous Q12H   . sodium chloride 20 mL/hr at 01/16/13 0700  . sodium chloride    . sodium chloride    . dexmedetomidine Stopped (01/16/13 0000)  . DOPamine    . insulin (NOVOLIN-R) infusion 1.9 Units/hr (01/16/13 0900)  . lactated ringers 20 mL/hr at 01/16/13 0700  . nitroGLYCERIN 0 mcg/min (01/15/13  2115)  . phenylephrine (NEO-SYNEPHRINE) Adult infusion Stopped (01/16/13 0800)    Physical Exam:   General appearance: alert, cooperative and no distress Neck: no carotid bruit and no JVD Lungs: no wheezing, decrease BS at base Heart: regular rate and rhythm and 1/6 SEM; no rub Abdomen: soft, non-tender; bowel sounds normal; no masses,  no organomegaly Extremities: no edema, redness or tenderness in the calves or thighs Skin: Skin color, texture, turgor normal. No rashes or lesions   Rate: 90  Rhythm: atrial paced   Lab Results:    Recent Labs  01/15/13 1502  01/15/13 2117 01/16/13 0355  NA 137  < > 138 138  K 4.1  < > 4.1 3.9  CL 102  --   --  106  CO2 23  --   --  23  GLUCOSE 97  < > 121* 152*  BUN 17  --   --  16  CREATININE 0.94  --   --  0.89  < > = values in this interval not displayed. No results found for this basename: TROPONINI, CK, MB,  in the last 72 hours Hepatic Function Panel No results found for this basename: PROT, ALBUMIN, AST, ALT, ALKPHOS, BILITOT, BILIDIR, IBILI,  in the last 72 hours  Recent  Labs  01/15/13 2118  INR 1.37   BNP (last 3 results) No results found for this basename: PROBNP,  in the last 8760 hours Lipid Panel  Lipid Panel  No results found for this basename: chol, trig, hdl, cholhdl, vldl, ldlcalc      Imaging:  Dg Chest 2 View  01/15/2013  *RADIOLOGY REPORT*  Clinical Data: Chest pain.  Preprocedural radiograph.  Cardiac catheterization.  CHEST - 2 VIEW  Comparison: 12/09/2008.  Findings:  Cardiopericardial silhouette within normal limits. Mediastinal contours normal. Trachea midline.  No airspace disease or effusion.  Calcification in the right upper chest suggests pleural plaque although this could be external to the chest.  This was not well visualized on prior exam.  IMPRESSION: No active cardiopulmonary disease.  Probable small pleural plaque at the right apex.   Original Report Authenticated By: Andreas Newport, M.D.     Dg Chest Portable 1 View In Am  01/16/2013  *RADIOLOGY REPORT*  Clinical Data: Postop, chest tube  PORTABLE CHEST - 1 VIEW  Comparison: 01/15/2013  Findings: Grossly unchanged cardiac silhouette and mediastinal contours post median sternotomy and CABG.  Interval extubation of removal of enteric tube.  Grossly unchanged position every support apparatus.  No pneumothorax.  Lung volumes remain reduced with persistent mild elevation of the right hemidiaphragm.  Minimal improved aeration of the bilateral lung bases with persistent bibasilar opacities. Unchanged granuloma overlying the peripheral aspect the right upper lung.  No new focal airspace opacities.  No definite pleural effusion.  Unchanged bones.  IMPRESSION: 1.  Interval extubation and removal of enteric tube.  Otherwise, stable positioning of support apparatus.  No pneumothorax. 2.  Improved aeration of the lung bases with persistent bibasilar atelectasis. 3.  Pulmonary venous congestion without frank evidence of edema.   Original Report Authenticated By: Tacey Ruiz, MD    Dg Chest Portable 1 View  01/15/2013  *RADIOLOGY REPORT*  Clinical Data: Postoperative CABG.  PORTABLE CHEST - 1 VIEW  Comparison: 01/15/2013  Findings: Since the previous study, there are interval postoperative changes with sternotomy wires and vascular markers in the mediastinum.  An endotracheal tube has been placed with tip about 3.8 cm above the carina.  Swan-Ganz catheter tip is in the location of the pulmonary outflow tract.  Enteric tube has been placed.  The tip is not visualized below the field of view but is below the left hemidiaphragm.  Bilateral chest tubes and mediastinal drain in place.  Right central venous catheter with tip overlying the lower SVC region.  No visible pneumothorax.  Mild atelectasis in the lung bases.  No pleural effusions.  Mediastinal contours appear intact.  IMPRESSION: Interval postoperative changes in the mediastinum.  Appliances appear to be  in satisfactory location.  Atelectasis in the lung bases.   Original Report Authenticated By: Burman Nieves, M.D.       Assessment/Plan:   Active Problems:   * No active hospital problems. *   Appreciate Dr. Sharee Pimple response for urgent CABG. Doing well, day 1 s/p CABG x 3 for severe LM, ostial LAD, and moderate RCA disease. Now off neo with stable hemodynamics. Will resume home atorvastatin at 40 mg daily. On low dose beta-blocker. Consider re-institution of ACE-I or ARB as tolerates.   Lennette Bihari, MD, [facility] 01/16/2013, 9:20 AM

## 2013-01-16 NOTE — Procedures (Signed)
Extubation Procedure Note  Patient Details:   Name: Derek Wells DOB: Feb 27, 1933 MRN: [id number]   Airway Documentation:     Evaluation  O2 sats: stable throughout Complications: No apparent complications Patient did tolerate procedure well. Bilateral Breath Sounds: Clear Suctioning: Airway Yes Patient extubated and placed on 5lpm with Sp02=98%. No stridor heard over upper airways. Leafy Half 01/16/2013, 2:10 AM

## 2013-01-16 NOTE — Plan of Care (Signed)
Problem: Phase I Progression Outcomes Goal: Patient status is OR emergent or Short Stay Outcome: Completed/Met Date Met:  01/16/13 emergency  Problem: Phase II Progression Outcomes Goal: Patient extubated within - Outcome: Completed/Met Date Met:  01/16/13 <6h

## 2013-01-17 ENCOUNTER — Inpatient Hospital Stay (HOSPITAL_COMMUNITY): Payer: Medicare Other

## 2013-01-17 LAB — CBC
MCH: 31.7 pg (ref 26.0–34.0)
MCV: 89.8 fL (ref 78.0–100.0)
Platelets: 135 10*3/uL — ABNORMAL LOW (ref 150–400)
RBC: 3.44 MIL/uL — ABNORMAL LOW (ref 4.22–5.81)

## 2013-01-17 LAB — BASIC METABOLIC PANEL
CO2: 27 mEq/L (ref 19–32)
Calcium: 8.3 mg/dL — ABNORMAL LOW (ref 8.4–10.5)
Glucose, Bld: 130 mg/dL — ABNORMAL HIGH (ref 70–99)
Sodium: 137 mEq/L (ref 135–145)

## 2013-01-17 LAB — GLUCOSE, CAPILLARY
Glucose-Capillary: 127 mg/dL — ABNORMAL HIGH (ref 70–99)
Glucose-Capillary: 148 mg/dL — ABNORMAL HIGH (ref 70–99)

## 2013-01-17 MED ORDER — DOCUSATE SODIUM 100 MG PO CAPS: 200.0000 mg | ORAL_CAPSULE | Freq: Every day | ORAL | Status: DC

## 2013-01-17 MED ORDER — POTASSIUM CHLORIDE CRYS ER 20 MEQ PO TBCR: 40.0000 meq | EXTENDED_RELEASE_TABLET | Freq: Once | ORAL | Status: DC

## 2013-01-17 MED ORDER — SODIUM CHLORIDE 0.9 % IV SOLN: 250.0000 mL | INTRAVENOUS | Status: DC | PRN

## 2013-01-17 MED ORDER — INSULIN ASPART 100 UNIT/ML ~~LOC~~ SOLN
0.0000 [IU] | SUBCUTANEOUS | Status: DC
  Administered 2013-01-17: 2 [IU] via SUBCUTANEOUS

## 2013-01-17 MED ORDER — MOVING RIGHT ALONG BOOK
Freq: Once | Status: AC
  Administered 2013-01-17: 14:00:00
  Filled 2013-01-17: qty 1

## 2013-01-17 MED ORDER — DOCUSATE SODIUM 100 MG PO CAPS
200.0000 mg | ORAL_CAPSULE | Freq: Every day | ORAL | Status: DC
  Administered 2013-01-18 – 2013-01-19 (×2): 200 mg via ORAL
  Filled 2013-01-17 (×2): qty 2

## 2013-01-17 MED ORDER — FAMOTIDINE 20 MG PO TABS
20.0000 mg | ORAL_TABLET | Freq: Two times a day (BID) | ORAL | Status: DC
  Administered 2013-01-17 – 2013-01-19 (×5): 20 mg via ORAL
  Filled 2013-01-17 (×6): qty 1

## 2013-01-17 MED ORDER — TRAMADOL HCL 50 MG PO TABS
50.0000 mg | ORAL_TABLET | ORAL | Status: DC | PRN
  Administered 2013-01-19: 50 mg via ORAL
  Administered 2013-01-19: 100 mg via ORAL
  Filled 2013-01-17: qty 1
  Filled 2013-01-17 (×2): qty 2

## 2013-01-17 MED ORDER — SODIUM CHLORIDE 0.9 % IJ SOLN: 3.0000 mL | INTRAMUSCULAR | Status: DC | PRN

## 2013-01-17 MED ORDER — ACETAMINOPHEN 325 MG PO TABS
650.0000 mg | ORAL_TABLET | Freq: Four times a day (QID) | ORAL | Status: DC | PRN
  Administered 2013-01-17: 650 mg via ORAL
  Filled 2013-01-17: qty 2

## 2013-01-17 MED ORDER — OXYCODONE HCL 5 MG PO TABS
5.0000 mg | ORAL_TABLET | ORAL | Status: DC | PRN
  Administered 2013-01-17 (×2): 10 mg via ORAL
  Filled 2013-01-17 (×3): qty 2

## 2013-01-17 MED ORDER — ZOLPIDEM TARTRATE 5 MG PO TABS: 5.0000 mg | ORAL_TABLET | Freq: Every evening | ORAL | Status: DC | PRN

## 2013-01-17 MED ORDER — FUROSEMIDE 40 MG PO TABS
40.0000 mg | ORAL_TABLET | Freq: Every day | ORAL | Status: DC
  Administered 2013-01-18 – 2013-01-19 (×2): 40 mg via ORAL
  Filled 2013-01-17 (×2): qty 1

## 2013-01-17 MED ORDER — ALPRAZOLAM 0.25 MG PO TABS: 0.2500 mg | ORAL_TABLET | Freq: Three times a day (TID) | ORAL | Status: DC | PRN

## 2013-01-17 MED ORDER — ASPIRIN EC 325 MG PO TBEC
325.0000 mg | DELAYED_RELEASE_TABLET | Freq: Every day | ORAL | Status: DC
  Administered 2013-01-18 – 2013-01-19 (×2): 325 mg via ORAL
  Filled 2013-01-17 (×2): qty 1

## 2013-01-17 MED ORDER — ONDANSETRON HCL 4 MG/2ML IJ SOLN: 4.0000 mg | Freq: Four times a day (QID) | INTRAMUSCULAR | Status: DC | PRN

## 2013-01-17 MED ORDER — ONDANSETRON HCL 4 MG PO TABS: 4.0000 mg | ORAL_TABLET | Freq: Four times a day (QID) | ORAL | Status: DC | PRN

## 2013-01-17 MED ORDER — BISACODYL 10 MG RE SUPP: 10.0000 mg | Freq: Every day | RECTAL | Status: DC | PRN

## 2013-01-17 MED ORDER — POTASSIUM CHLORIDE CRYS ER 20 MEQ PO TBCR
40.0000 meq | EXTENDED_RELEASE_TABLET | Freq: Every day | ORAL | Status: DC
  Administered 2013-01-18 – 2013-01-19 (×2): 40 meq via ORAL
  Filled 2013-01-17 (×2): qty 2

## 2013-01-17 MED ORDER — BISACODYL 5 MG PO TBEC: 10.0000 mg | DELAYED_RELEASE_TABLET | Freq: Every day | ORAL | Status: DC | PRN

## 2013-01-17 MED ORDER — SODIUM CHLORIDE 0.9 % IJ SOLN
3.0000 mL | Freq: Two times a day (BID) | INTRAMUSCULAR | Status: DC
  Administered 2013-01-17 – 2013-01-18 (×3): 3 mL via INTRAVENOUS

## 2013-01-17 MED ORDER — FUROSEMIDE 10 MG/ML IJ SOLN: 40.0000 mg | Freq: Once | INTRAMUSCULAR | Status: DC

## 2013-01-17 NOTE — Op Note (Signed)
NAME:  Derek Wells, Derek Wells NO.:  [id number]  MEDICAL RECORD NO.:  [id number]  LOCATION:  2023                         FACILITY:  [facility]  PHYSICIAN:  Evelene Croon, M.D.     DATE OF BIRTH:  [birth year]  DATE OF PROCEDURE:  01/15/2013 DATE OF DISCHARGE:                              OPERATIVE REPORT   PREOPERATIVE DIAGNOSIS:  High-grade left main and 2-vessel coronary artery disease.  POSTOPERATIVE DIAGNOSIS:  High-grade left main and 2-vessel coronary artery disease.  OPERATIVE PROCEDURE:  Median sternotomy, extracorporeal circulation, coronary artery bypass graft surgery x3 using a left internal mammary artery graft to left anterior descending coronary artery, with a saphenous vein graft to the obtuse marginal branch of left circumflex coronary artery, and a saphenous vein graft to the posterior descending branch of the right coronary artery.  Endoscopic vein harvesting from the right leg.  ATTENDING SURGEON:  Evelene Croon, M.D.  ASSISTANT:  Doree Fudge, PA-C  ANESTHESIA:  General endotracheal.  CLINICAL HISTORY:  This patient is a 79 year old gentleman with a history of hypertension, hyperlipidemia, cigar smoking, strong family history of heart disease, and prior abdominal aortic aneurysm repair in 2010, who presented with a history of exertional chest discomfort as well as a burning pain in his throat associated with pain in his left shoulder and arm since last fall.  The symptoms have progressed over time.  He did have a negative nuclear stress test last fall.  He recently presented to Dr. Nicki Guadalajara with persistent and progressing symptoms, and it was felt that cardiac catheterizations indicated.  This showed a 60%-70% ostial left main stenosis and a 90% distal left main stenosis.  The LAD had a 99% ostial stenosis.  Left circumflex system gave off a large branching marginal vessel.  The right coronary artery was diffusely irregular with 20%-30%  proximal and mid vessel stenosis and 50% ostial stenosis of the posterior descending branch.  Left ventricular ejection fraction was normal with no wall motion abnormalities.  I was called to the Cath Lab to evaluate the patient and felt that proceeding ahead with urgent coronary bypass graft surgery was the best treatment to prevent further ischemia, infarction, improve his quality of life, and prevent sudden death.  I discussed the operative procedure with the patient and his wife including alternatives, benefits, and risks including, but not limited to bleeding, blood transfusion, infection, stroke, myocardial infarction, graft failure, and death.  He understood all this and agreed to proceed.  OPERATIVE PROCEDURE:  The patient was seen in the preoperative holding area, and the proper patient, proper operation were confirmed with nursing and anesthesia staff as well as the patient.  The consent was signed by me.  He was taken back to the operating room and placed on the table in supine position.  After induction of general endotracheal anesthesia, a Foley catheter was placed in bladder using sterile technique.  Then, the chest, abdomen, and both lower extremities were prepped and draped in the usual sterile manner.  A time-out was taken. Proper patient, proper operation were confirmed with nursing and anesthesia staff.  Then, the chest was opened through a median sternotomy incision.  The  pericardium was opened in the midline. Examination of the heart showed good ventricular contractility.  The ascending aorta had some calcified plaque within the aortic root portion, but the mid and distal ascending aorta had no palpable plaques in it.  Then, the left internal mammary artery was harvested from chest wall as a pedicle graft.  This was a medium caliber vessel with excellent blood flow through it.  At the same time, a segment of greater saphenous vein was harvested from the right leg  using endoscopic vein harvest technique.  This vein was of medium size and good quality.  Then, the patient was heparinized when adequate ACT was obtained.  The distal ascending aorta was cannulated using a 22-French aortic cannula for arterial inflow.  Venous outflow was achieved using a two-stage venous cannula for the right atrial appendage.  An antegrade cardioplegia and vent cannula was inserted into the aortic root.  The patient was placed on cardiopulmonary bypass, and the distal coronary artery was identified.  The LAD was diffusely diseased throughout its proximal and midportions, but distally the vessel was fairly free of disease and suitable for grafting.  The large marginal vessel had no significant distal disease in it.  The posterior descending branch had calcified plaque right at its ostium, but the remainder of the vessel was free of disease.  Then, the aorta was crossclamped, and 1000 mL of cold blood antegrade cardioplegia was administered in the aortic root with quick arrest of the heart.  Systemic hypothermia to 32 degrees centigrade and topical hypothermia with iced saline was used.  Temperature probe was placed in septum insulating pad in the pericardium.  The first distal anastomosis was performed to the obtuse marginal branch.  The internal diameter was about 1.75 mm.  The conduit used was the segment of greater saphenous vein and anastomosis performed end-to- side manner using continuous 7-0 suture.  Flow was noted through the graft and was excellent.  Then, the second distal anastomosis was performed to the posterior descending coronary artery.  The internal diameter of this vessel was 1.6 mm.  The conduit used was a second segment of greater saphenous vein and anastomosis performed in the end-to-side manner using continuous 7-0 Prolene suture.  Flow was noted through the graft and was excellent. Then, a dose of cardioplegia was given down the vein grafts  and aortic root.  The third distal anastomosis was performed to the distal LAD.  The internal diameter here was about 1.6 mm.  The conduit used was a left internal mammary graft and was brought through an opening of the left pericardium anterior to the phrenic nerve.  This was anastomosed to LAD in end-to-side manner using continuous 8-0 Prolene suture.  The pedicle was sutured to the epicardium with 6-0 Prolene sutures.  Then, another dose of cardioplegia was given.  With the crossclamp in place, the 2 proximal vein graft anastomoses were performed of the mid ascending aorta in end-to-side manner using continuous 6-0 Prolene suture.  Then, the patient was rewarmed to 37 degrees centigrade.  The clamp was removed from the mammary pedicle.  There was rapid warming of the ventricular septum and return of spontaneous ventricular fibrillation. Crossclamp was removed with time of 57 minutes, and the patient spontaneously converted to sinus rhythm.  The proximal and distal anastomoses appeared hemostatic and allowed the grafts to effect.  Graft marker was placed around the proximal anastomoses.  Two temporary right ventricular and right atrial pacing wires placed and brought out through  the skin.  When the patient rewarmed to 37 degrees centigrade, he was weaned from cardiopulmonary bypass on no inotropic agents.  Total bypass time was 77 minutes.  Cardiac function appeared excellent with cardiac output of 5 liters/minute.  Protamine was given, and the venous and aortic cannulae were without difficulty.  Hemostasis was achieved.  Four chest tubes were placed with bilateral pleural tubes, a tube in the posterior pericardium, one in the anterior mediastinum.  The sternum was then closed with double #6 stainless steel wires.  The fascia was closed with continuous #1 Vicryl suture.  Subcutaneous tissue was closed with continuous 2-0 Vicryl, and the skin with a 3-0 Vicryl subcuticular closure.   The lower extremity vein harvest site was closed in layers in the similar manner.  The sponge, needle, and instrument counts were correct according to the scrub nurse.  Dry sterile dressing was applied over the incisions of the around the chest tubes, which were hooked to Pleur-Evac suction.  The patient remained hemodynamically stable and transferred to the SICU in guarded, but stable condition.     Evelene Croon, M.D.     BB/MEDQ  D:  01/16/2013  T:  01/17/2013  Job:  [id number]

## 2013-01-17 NOTE — Progress Notes (Signed)
2 Days Post-Op Procedure(s) (LRB): Coronary Artery Bypass Graft times three using Right Greater Saphenous Vein Graft harvested endoscopically and Left Internal Mammary Artery and (N/A) INTRAOPERATIVE TRANSESOPHAGEAL ECHOCARDIOGRAM Subjective: No complaints  Objective: Vital signs in last 24 hours: Temp:  [98.2 F (36.8 C)-98.8 F (37.1 C)] 98.2 F (36.8 C) (04/27 0800) Pulse Rate:  [65-79] 72 (04/27 1000) Cardiac Rhythm:  [-] Heart block (04/27 0800) Resp:  [8-24] 24 (04/27 1000) BP: (94-141)/(42-65) 126/65 mmHg (04/27 1000) SpO2:  [92 %-97 %] 94 % (04/27 1000) Arterial Line BP: (148)/(57) 148/57 mmHg (04/26 1100) Weight:  [111.902 kg (246 lb 11.2 oz)] 111.902 kg (246 lb 11.2 oz) (04/27 0600)  Hemodynamic parameters for last 24 hours: PAP: (34)/(18) 34/18 mmHg  Intake/Output from previous day: 04/26 0701 - 04/27 0700 In: 1416 [P.O.:720; I.V.:546; IV Piggyback:150] Out: 2625 [Urine:2625] Intake/Output this shift: Total I/O In: 60 [I.V.:60] Out: 400 [Urine:400]  General appearance: alert and cooperative Neurologic: intact Heart: regular rate and rhythm, S1, S2 normal, no murmur, click, rub or gallop Lungs: clear to auscultation bilaterally Extremities: extremities normal, atraumatic, no cyanosis or edema Wound: incision ok  Lab Results:  Recent Labs  01/16/13 1525 01/17/13 0420  WBC 12.8* 11.2*  HGB 11.3* 10.9*  HCT 32.8* 30.9*  PLT 141* 135*   BMET:  Recent Labs  01/16/13 0355 01/16/13 1524 01/16/13 1525 01/17/13 0420  NA 138 139  --  137  K 3.9 3.9  --  4.0  CL 106 100  --  103  CO2 23  --   --  27  GLUCOSE 152* 111*  --  130*  BUN 16 14  --  15  CREATININE 0.89 0.90 0.95 0.99  CALCIUM 7.9*  --   --  8.3*    PT/INR:  Recent Labs  01/15/13 2118  LABPROT 16.5*  INR 1.37   ABG    Component Value Date/Time   PHART 7.341* 01/16/2013 0312   HCO3 22.0 01/16/2013 0312   TCO2 25 01/16/2013 1524   ACIDBASEDEF 4.0* 01/16/2013 0312   O2SAT 97.0  01/16/2013 0312   CBG (last 3)   Recent Labs  01/17/13 0023 01/17/13 0355 01/17/13 0801  GLUCAP 116* 112* 127*    Assessment/Plan: S/P Procedure(s) (LRB): Coronary Artery Bypass Graft times three using Right Greater Saphenous Vein Graft harvested endoscopically and Left Internal Mammary Artery and (N/A) INTRAOPERATIVE TRANSESOPHAGEAL ECHOCARDIOGRAM Mobilize Diuresis Diabetes control Plan for transfer to step-down: see transfer orders   LOS: 2 days    Derek Wells K 01/17/2013

## 2013-01-17 NOTE — Progress Notes (Signed)
Utilization review completed.  

## 2013-01-17 NOTE — Progress Notes (Signed)
[facility] Progress Note  Subjective:  Day 2 s/p emergent CABG for severe coronary anatomy.  Objective:   Vital Signs in the last 24 hours: Temp:  [98.2 F (36.8 C)-98.8 F (37.1 C)] 98.2 F (36.8 C) (04/27 0800) Pulse Rate:  [65-80] 70 (04/27 0800) Resp:  [8-23] 12 (04/27 0800) BP: (94-141)/(42-60) 124/54 mmHg (04/27 0800) SpO2:  [94 %-97 %] 94 % (04/27 0800) Arterial Line BP: (132-148)/(51-57) 148/57 mmHg (04/26 1100) Weight:  [111.902 kg (246 lb 11.2 oz)] 111.902 kg (246 lb 11.2 oz) (04/27 0600)  Intake/Output from previous day: 04/26 0701 - 04/27 0700 In: 1416 [P.O.:720; I.V.:546; IV Piggyback:150] Out: 2625 [Urine:2625]  Scheduled: . acetaminophen  1,000 mg Oral Q6H   Or  . acetaminophen (TYLENOL) oral liquid 160 mg/5 mL  975 mg Per Tube Q6H  . aspirin EC  325 mg Oral Daily   Or  . aspirin  324 mg Per Tube Daily  . atorvastatin  40 mg Oral Daily  . bisacodyl  10 mg Oral Daily   Or  . bisacodyl  10 mg Rectal Daily  . docusate sodium  200 mg Oral Daily  . enoxaparin (LOVENOX) injection  40 mg Subcutaneous QHS  . insulin aspart  0-24 Units Subcutaneous Q4H  . insulin detemir  20 Units Subcutaneous Q1200  . insulin regular  0-10 Units Intravenous TID WC  . metoprolol tartrate  25 mg Oral BID   Or  . metoprolol tartrate  12.5 mg Per Tube BID  . multivitamin with minerals  1 tablet Oral Daily  . niacin  500 mg Oral QHS  . omega-3 acid ethyl esters  1 g Oral Daily  . pantoprazole  40 mg Oral Daily  . sodium chloride  3 mL Intravenous Q12H   . sodium chloride 20 mL/hr at 01/16/13 0700  . sodium chloride    . sodium chloride    . dexmedetomidine Stopped (01/16/13 0000)  . DOPamine    . lactated ringers 20 mL/hr at 01/17/13 0700  . nitroGLYCERIN 0 mcg/min (01/15/13 2115)  . phenylephrine (NEO-SYNEPHRINE) Adult infusion Stopped (01/16/13 0800)     Physical Exam:   General appearance: alert, cooperative and no distress Neck: no  adenopathy, no JVD and supple, symmetrical, trachea midline Lungs: clear to auscultation bilaterally Heart: regular rate and rhythm and 1/6 SEM; no rub Abdomen: soft, non-tender; bowel sounds normal; no masses,  no organomegaly Extremities: no edema, redness or tenderness in the calves or thighs   Rate: 74  Rhythm: normal sinus rhythm  Lab Results:    Recent Labs  01/16/13 0355 01/16/13 1524 01/16/13 1525 01/17/13 0420  NA 138 139  --  137  K 3.9 3.9  --  4.0  CL 106 100  --  103  CO2 23  --   --  27  GLUCOSE 152* 111*  --  130*  BUN 16 14  --  15  CREATININE 0.89 0.90 0.95 0.99   No results found for this basename: TROPONINI, CK, MB,  in the last 72 hours Hepatic Function Panel No results found for this basename: PROT, ALBUMIN, AST, ALT, ALKPHOS, BILITOT, BILIDIR, IBILI,  in the last 72 hours  Recent Labs  01/15/13 2118  INR 1.37   BNP (last 3 results) No results found for this basename: PROBNP,  in the last 8760 hours Lipid Panel  Lipid Panel  No results found for this basename: chol, trig, hdl, cholhdl, vldl, ldlcalc  Imaging:  Dg Chest Port 1 View  01/17/2013  *RADIOLOGY REPORT*  Clinical Data: Postop cardiac surgery, removal of chest tubes  PORTABLE CHEST - 1 VIEW  Comparison: 01/16/2013; 01/15/2013  Findings: Grossly unchanged enlarged cardiac silhouette and mediastinal contours post median sternotomy and CABG.  Interval removal of bilateral chest tubes and mediastinal drains.  Interval removal of a PA catheter with remaining vascular sheath tip projecting over the superior SVC.  Additional right jugular approach central venous catheter tip projects over the superior cavoatrial junction.  Interval development of a tiny right apical pneumothorax.  No left- sided pneumothorax.  Lung volumes remain reduced with grossly unchanged bibasilar opacities.  No new focal airspace opacities. No definite evidence of edema.  Unchanged bones.  IMPRESSION: 1.  Interval  removal of bilateral chest tubes and mediastinal drain with development of a tiny right apical pneumothorax.  Continued attention on follow-up is recommended. 2.  Grossly unchanged hypoventilation and bibasilar opacities favored to represent atelectasis.  No definite evidence of edema.  Above findings discussed with Carey Bullocks, RN at [phone].   Original Report Authenticated By: Tacey Ruiz, MD    Dg Chest Portable 1 View In Am  01/16/2013  *RADIOLOGY REPORT*  Clinical Data: Postop, chest tube  PORTABLE CHEST - 1 VIEW  Comparison: 01/15/2013  Findings: Grossly unchanged cardiac silhouette and mediastinal contours post median sternotomy and CABG.  Interval extubation of removal of enteric tube.  Grossly unchanged position every support apparatus.  No pneumothorax.  Lung volumes remain reduced with persistent mild elevation of the right hemidiaphragm.  Minimal improved aeration of the bilateral lung bases with persistent bibasilar opacities. Unchanged granuloma overlying the peripheral aspect the right upper lung.  No new focal airspace opacities.  No definite pleural effusion.  Unchanged bones.  IMPRESSION: 1.  Interval extubation and removal of enteric tube.  Otherwise, stable positioning of support apparatus.  No pneumothorax. 2.  Improved aeration of the lung bases with persistent bibasilar atelectasis. 3.  Pulmonary venous congestion without frank evidence of edema.   Original Report Authenticated By: Tacey Ruiz, MD    Dg Chest Portable 1 View  01/15/2013  *RADIOLOGY REPORT*  Clinical Data: Postoperative CABG.  PORTABLE CHEST - 1 VIEW  Comparison: 01/15/2013  Findings: Since the previous study, there are interval postoperative changes with sternotomy wires and vascular markers in the mediastinum.  An endotracheal tube has been placed with tip about 3.8 cm above the carina.  Swan-Ganz catheter tip is in the location of the pulmonary outflow tract.  Enteric tube has been placed.  The tip is not visualized below  the field of view but is below the left hemidiaphragm.  Bilateral chest tubes and mediastinal drain in place.  Right central venous catheter with tip overlying the lower SVC region.  No visible pneumothorax.  Mild atelectasis in the lung bases.  No pleural effusions.  Mediastinal contours appear intact.  IMPRESSION: Interval postoperative changes in the mediastinum.  Appliances appear to be in satisfactory location.  Atelectasis in the lung bases.   Original Report Authenticated By: Burman Nieves, M.D.       Assessment/Plan:   Active Problems:   * No active hospital problems. * Severe CAD; s/p CABG Hyperlipidemia Hypertension S/P AAA resection 2010  Day 2 s/p emergent CABG x3 for severe LM/ostial LAD disease and moderate RCA stenosis. CT out. Stable hemodynamics off pressors. For transfer to 2000 today. BP 110 systolic.  If pressure is stable will add very low dose ACE-I or  ARB tomorrow.    Lennette Bihari, MD, [facility] 01/17/2013, 9:16 AM

## 2013-01-17 NOTE — Progress Notes (Signed)
Report called to 2000 RN, pt to receive bath and lunch before transferring to new room. Transfer delayed due to acuity of other ICU assigned patient

## 2013-01-17 NOTE — Anesthesia Postprocedure Evaluation (Signed)
  Anesthesia Post-op Note  Patient: Derek Wells  Procedure(s) Performed: Procedure(s): Coronary Artery Bypass Graft times three using Right Greater Saphenous Vein Graft harvested endoscopically and Left Internal Mammary Artery and (N/A) INTRAOPERATIVE TRANSESOPHAGEAL ECHOCARDIOGRAM  Patient Location: PACU and SICU  Anesthesia Type:General  Level of Consciousness: Patient remains intubated per anesthesia plan  Airway and Oxygen Therapy: Patient remains intubated per anesthesia plan  Post-op Pain: mild  Post-op Assessment: Post-op Vital signs reviewed  Post-op Vital Signs: Reviewed  Complications: No apparent anesthesia complications

## 2013-01-17 NOTE — Progress Notes (Signed)
Alert, neuro intact, some incisional soreness, otherwise feels OK  VS: T-37 BP 106/43 HR 68 (SR) RR 12 O2 Sat 96% on 2L  H/H: 10.9/30.9 Plts 135,000 BUN/Cr:15/0.99 K: 4.0  Extubated 5 hours post-op. Making good progress  Kipp Brood

## 2013-01-17 NOTE — Progress Notes (Signed)
Pt ambulated in hall with wife 200 ft. Tolerated well.

## 2013-01-17 NOTE — CV Procedure (Signed)
Intraoperative Transesophageal Echocardiography Report:  Mr. Derek Wells is a 79 year old male with a several month history of pain in his left arm and shoulder with exertion. He subsequently underwent cardiac catheterization by Dr. Nicki Guadalajara which revealed 90% distal left main stenosis and 99% ostial LAD stenosis with preserved left ventricular function. He was then brought to the operating room for urgent coronary artery bypass grafting by Dr. Laneta Simmers. Intraoperative transesophageal echocardiography was indicated to evaluate for any valvular pathology, assess the right and left ventricular function, and to serve as a monitor for intraoperative volume status  The patient was brought to the operating room at [facility] and general anesthesia was induced without difficulty. Following endotracheal intubation and orogastric suctioning, the transesophageal echocardiography probe was inserted into the esophagus without difficulty.  Impression: Pre-bypass findings:  1. Aortic valve: The aortic valve was trileaflet. There was an area of thickening and calcification noted in the non-coronary cusp. The left and right coronary cusp appeared free of thickening or calcification. The leaflets opened normally and there was no aortic insufficiency  2. Mitral valve: The mitral leaflets appeared to coapt normally and there was trace mitral insufficiency. There were no prolapsing or flail segments noted. There was mild mitral annular calcification noted.  3. Left ventricle: There was normal contractility in all segments interrogated and the ejection fraction was estimated at 55-60%. There were no regional wall motion abnormalities. Left ventricular end-diastolic diameter measured 4.5 cm at the mid-papillary view in the transgastric short axis view. Left ventricular wall thickness was 0.95 cm of the anterior wall and 0.98 cm of the posterior wall at end diastole in the transgastric short axis view. There was no  thrombus noted in the left ventricular apex.  4. Right ventricle: The right ventricular cavity was of normal limits in size. There was normal contractility of the right ventricular free wall.  5. Tricuspid valve: The tricuspid valve appeared structurally normal and there was trace tricuspid insufficiency.  6. Interatrial septum: The interatrial septum was intact without evidence of patent foramen ovale or atrial septal defect by color Doppler or bubble study.  7. Left atrium: There was no thrombus noted in the left atrium or left atrial appendage.  8. Aescending aorta: There was mild thickening of the wall of ascending aorta. There was a well-defined aortic root and sinotubular ridge without aneurysmal dilatation or effacement.  9. Descending aorta: The descending aorta measured 2.75 cm in diameter and appeared free of atheromatous disease.  Post-bypass findings:  1. Aortic valve: There was no aortic insufficiency and the aortic valve appeared unchanged from the pre-bypass study.  2. Mitral valve: The mitral valve appeared unchanged from the pre-bypass study and there was trace mitral insufficiency.  3. Left ventricle: The left ventricular function appeared normal with no regional wall motion abnormalities and the ejection fraction was estimated at 55-60%.  4. Right ventricle: There appeared to be normal contractility the right ventricular free wall and normal right ventricular size.  Derek Wells, M.D.

## 2013-01-18 ENCOUNTER — Encounter (HOSPITAL_COMMUNITY): Payer: Self-pay | Admitting: Surgery

## 2013-01-18 ENCOUNTER — Inpatient Hospital Stay (HOSPITAL_COMMUNITY): Payer: Medicare Other

## 2013-01-18 LAB — TYPE AND SCREEN
Unit division: 0
Unit division: 0

## 2013-01-18 LAB — GLUCOSE, CAPILLARY: Glucose-Capillary: 118 mg/dL — ABNORMAL HIGH (ref 70–99)

## 2013-01-18 MED ORDER — LISINOPRIL 2.5 MG PO TABS
2.5000 mg | ORAL_TABLET | Freq: Every day | ORAL | Status: DC
  Administered 2013-01-18 – 2013-01-19 (×2): 2.5 mg via ORAL
  Filled 2013-01-18 (×2): qty 1

## 2013-01-18 MED ORDER — METOPROLOL TARTRATE 12.5 MG HALF TABLET
12.5000 mg | ORAL_TABLET | Freq: Two times a day (BID) | ORAL | Status: DC
  Administered 2013-01-18 – 2013-01-19 (×2): 12.5 mg via ORAL
  Filled 2013-01-18 (×3): qty 1

## 2013-01-18 MED ORDER — METOPROLOL TARTRATE 12.5 MG HALF TABLET
12.5000 mg | ORAL_TABLET | Freq: Two times a day (BID) | ORAL | Status: DC
  Administered 2013-01-18: 12.5 mg via ORAL
  Filled 2013-01-18 (×2): qty 1

## 2013-01-18 MED ORDER — METOPROLOL TARTRATE 25 MG PO TABS: 25.0000 mg | ORAL_TABLET | Freq: Two times a day (BID) | ORAL | Status: DC

## 2013-01-18 MED FILL — Heparin Sodium (Porcine) Inj 1000 Unit/ML: INTRAMUSCULAR | Qty: 60 | Status: AC

## 2013-01-18 MED FILL — Sodium Bicarbonate IV Soln 8.4%: INTRAVENOUS | Qty: 50 | Status: AC

## 2013-01-18 MED FILL — Magnesium Sulfate Inj 50%: INTRAMUSCULAR | Qty: 10 | Status: AC

## 2013-01-18 MED FILL — Mannitol IV Soln 20%: INTRAVENOUS | Qty: 500 | Status: AC

## 2013-01-18 MED FILL — Sodium Chloride IV Soln 0.9%: INTRAVENOUS | Qty: 1000 | Status: AC

## 2013-01-18 MED FILL — Potassium Chloride Inj 2 mEq/ML: INTRAVENOUS | Qty: 40 | Status: AC

## 2013-01-18 MED FILL — Heparin Sodium (Porcine) Inj 1000 Unit/ML: INTRAMUSCULAR | Qty: 10 | Status: AC

## 2013-01-18 MED FILL — Lidocaine HCl IV Inj 20 MG/ML: INTRAVENOUS | Qty: 5 | Status: AC

## 2013-01-18 MED FILL — Electrolyte-R (PH 7.4) Solution: INTRAVENOUS | Qty: 4000 | Status: AC

## 2013-01-18 MED FILL — Sodium Chloride Irrigation Soln 0.9%: Qty: 3000 | Status: AC

## 2013-01-18 NOTE — Progress Notes (Signed)
[facility]  Subjective: S/P CABG X 3. POD# 3.   Objective: Vital signs in last 24 hours: Temp:  [98.2 F (36.8 C)-99.6 F (37.6 C)] 99.4 F (37.4 C) (04/28 0414) Pulse Rate:  [66-82] 80 (04/28 0414) Resp:  [17-24] 20 (04/28 0414) BP: (107-131)/(49-76) 131/69 mmHg (04/28 0414) SpO2:  [91 %-94 %] 94 % (04/28 0414) Weight:  [239 lb 13.8 oz (108.8 kg)] 239 lb 13.8 oz (108.8 kg) (04/28 0414) Last BM Date: 01/17/13  Intake/Output from previous day: 04/27 0701 - 04/28 0700 In: 480 [P.O.:420; I.V.:60] Out: 700 [Urine:700] Intake/Output this shift:    Medications Current Facility-Administered Medications  Medication Dose Route Frequency Provider Last Rate Last Dose  . 0.9 %  sodium chloride infusion  250 mL Intravenous PRN Alleen Borne, MD      . acetaminophen (TYLENOL) tablet 650 mg  650 mg Oral Q6H PRN Alleen Borne, MD   650 mg at 01/17/13 1915  . ALPRAZolam Prudy Feeler) tablet 0.25 mg  0.25 mg Oral TID PRN Abelino Derrick, PA-C      . aspirin EC tablet 325 mg  325 mg Oral Daily Alleen Borne, MD      . atorvastatin (LIPITOR) tablet 40 mg  40 mg Oral Daily Ardelle Balls, PA-C   40 mg at 01/17/13 0957  . bisacodyl (DULCOLAX) EC tablet 10 mg  10 mg Oral Daily PRN Alleen Borne, MD       Or  . bisacodyl (DULCOLAX) suppository 10 mg  10 mg Rectal Daily PRN Alleen Borne, MD      . docusate sodium (COLACE) capsule 200 mg  200 mg Oral Daily Lennette Bihari, MD      . enoxaparin (LOVENOX) injection 40 mg  40 mg Subcutaneous QHS Alleen Borne, MD   40 mg at 01/17/13 2149  . famotidine (PEPCID) tablet 20 mg  20 mg Oral BID Alleen Borne, MD   20 mg at 01/17/13 2148  . furosemide (LASIX) tablet 40 mg  40 mg Oral Daily Alleen Borne, MD      . lisinopril (PRINIVIL,ZESTRIL) tablet 2.5 mg  2.5 mg Oral Daily Donielle M Zimmerman, PA-C      . metoprolol tartrate (LOPRESSOR) tablet 12.5 mg  12.5 mg Oral BID Donielle M Zimmerman, PA-C      . multivitamin  with minerals tablet 1 tablet  1 tablet Oral Daily Ardelle Balls, PA-C   1 tablet at 01/17/13 0957  . niacin (NIASPAN) CR tablet 500 mg  500 mg Oral QHS Ardelle Balls, PA-C   500 mg at 01/17/13 2149  . omega-3 acid ethyl esters (LOVAZA) capsule 1 g  1 g Oral Daily Lennette Bihari, MD   1 g at 01/17/13 0957  . ondansetron (ZOFRAN) tablet 4 mg  4 mg Oral Q6H PRN Alleen Borne, MD       Or  . ondansetron (ZOFRAN) injection 4 mg  4 mg Intravenous Q6H PRN Alleen Borne, MD      . oxyCODONE (Oxy IR/ROXICODONE) immediate release tablet 5-10 mg  5-10 mg Oral Q3H PRN Alleen Borne, MD   10 mg at 01/17/13 2149  . potassium chloride SA (K-DUR,KLOR-CON) CR tablet 40 mEq  40 mEq Oral Daily Alleen Borne, MD      . sodium chloride 0.9 % injection 3 mL  3 mL Intravenous Q12H Alleen Borne, MD   3 mL at 01/17/13  2150  . sodium chloride 0.9 % injection 3 mL  3 mL Intravenous PRN Alleen Borne, MD      . traMADol Janean Sark) tablet 50-100 mg  50-100 mg Oral Q4H PRN Alleen Borne, MD      . zolpidem (AMBIEN) tablet 5 mg  5 mg Oral QHS PRN Abelino Derrick, PA-C        PE: [place]/AT No jvd Slightly decreased BS at bases; no wheezing RRR 1/6 SEM, no rub Abd soft, BS+ No sig edema Nonfocal neurological   Lab Results:   Recent Labs  01/16/13 0355 01/16/13 1524 01/16/13 1525 01/17/13 0420  WBC 13.3*  --  12.8* 11.2*  HGB 11.2* 11.9* 11.3* 10.9*  HCT 32.3* 35.0* 32.8* 30.9*  PLT 143*  --  141* 135*   BMET  Recent Labs  01/15/13 1502  01/16/13 0355 01/16/13 1524 01/16/13 1525 01/17/13 0420  NA 137  < > 138 139  --  137  K 4.1  < > 3.9 3.9  --  4.0  CL 102  --  106 100  --  103  CO2 23  --  23  --   --  27  GLUCOSE 97  < > 152* 111*  --  130*  BUN 17  --  16 14  --  15  CREATININE 0.94  --  0.89 0.90 0.95 0.99  CALCIUM 9.1  --  7.9*  --   --  8.3*  < > = values in this interval not displayed. PT/INR  Recent Labs  01/15/13 2118  LABPROT 16.5*  INR 1.37     Assessment/Plan  Active Problems: Severe CAD; s/p CABG  Hyperlipidemia  Hypertension  S/P AAA resection 2010   Plan: Day 3 s/p emergent CABG x 3 for severe LM/ostial LAD disease and moderate RCA stenosis. LIMA to LAD, SVG to OM, SVG to PDA. Normal progression.  Maintaining NSR on telemetry. HR of 75. No post-operative arrythmia's noted on telemetry. He is on a low dose of BB, 12.5 mg of Lopressor BID. Atorvastatin was restarted yesterday. Low dose lisinopril, 2.5 mg daily, will start today. BP is stable. Will continue to monitor.    LOS: 3 days    Brittainy M. Delmer Islam 01/18/2013 9:48 AM  Patient seen and examined. Agree with assessment and plan. Doing exceptionally well, day 3 s/p CABG. Sinus rhythm. Will add low dose ACE-I today. Lasix for very mild volume overlload. F/U CXR;  WU:JWJX small apical pneumothorax on CXR post chest tube removal. Possible dc 24 - 48 hrs.   Lennette Bihari, MD, [facility] 01/18/2013 10:18 AM

## 2013-01-18 NOTE — Progress Notes (Signed)
CARDIAC REHAB PHASE I   PRE:  Rate/Rhythm: 79 SR    BP: sitting 109/64    SaO2: 93 RA  MODE:  Ambulation: 690 ft   POST:  Rate/Rhythm: 89     BP: sitting 140/70     SaO2: 96 RA  Tolerated well. Tired toward end of walk. VSS. Return to chair. Moving well, only slight unsteadiness after getting up without RW.  [id number] Harriet Masson CES, ACSM 01/18/2013 10:12 AM

## 2013-01-18 NOTE — Progress Notes (Addendum)
                   [address]            [place] [address]          [phone]      3 Days Post-Op Procedure(s) (LRB): Coronary Artery Bypass Graft times three using Right Greater Saphenous Vein Graft harvested endoscopically and Left Internal Mammary Artery and (N/A) INTRAOPERATIVE TRANSESOPHAGEAL ECHOCARDIOGRAM  Subjective: Patient with some incisional pain. Passing flatus but now bowel movement yet  Objective: Vital signs in last 24 hours: Temp:  [98.2 F (36.8 C)-99.6 F (37.6 C)] 99.4 F (37.4 C) (04/28 0414) Pulse Rate:  [66-82] 80 (04/28 0414) Cardiac Rhythm:  [-] Heart block (04/27 1940) Resp:  [12-24] 20 (04/28 0414) BP: (107-131)/(48-76) 131/69 mmHg (04/28 0414) SpO2:  [91 %-94 %] 94 % (04/28 0414) Weight:  [108.8 kg (239 lb 13.8 oz)] 108.8 kg (239 lb 13.8 oz) (04/28 0414)  Pre op weight 107 kg Current Weight  01/18/13 108.8 kg (239 lb 13.8 oz)      Intake/Output from previous day: 04/27 0701 - 04/28 0700 In: 480 [P.O.:420; I.V.:60] Out: 700 [Urine:700]   Physical Exam:  Cardiovascular: RRR, no murmurs, gallops, or rubs. Pulmonary: Clear to auscultation bilaterally; no rales, wheezes, or rhonchi. Abdomen: Soft, non tender, bowel sounds present. Extremities: Trace bilateral lower extremity edema. Wounds: Clean and dry.  No erythema or signs of infection.  Lab Results: CBC: Recent Labs  01/16/13 1525 01/17/13 0420  WBC 12.8* 11.2*  HGB 11.3* 10.9*  HCT 32.8* 30.9*  PLT 141* 135*   BMET:  Recent Labs  01/16/13 0355 01/16/13 1524 01/16/13 1525 01/17/13 0420  NA 138 139  --  137  K 3.9 3.9  --  4.0  CL 106 100  --  103  CO2 23  --   --  27  GLUCOSE 152* 111*  --  130*  BUN 16 14  --  15  CREATININE 0.89 0.90 0.95 0.99  CALCIUM 7.9*  --   --  8.3*    PT/INR:  Lab Results  Component Value Date   INR 1.37 01/15/2013   INR 0.98 01/15/2013   INR 1.1 01/06/2009   ABG:  INR: Will add last result for INR, ABG once  components are confirmed Will add last 4 CBG results once components are confirmed  Assessment/Plan:  1. CV - SR. Start Lopressor 12.5 bid. Add low dose ACE as bp allows. 2.  Pulmonary - Encourage incentive spirometer 3. Volume Overload - On Lasix 40 daily 4.  Acute blood loss anemia - H and H stable at 10.9 and 30.9 5.Mild thrombocytopenia-platelets 135,000. 6.CBGs 148/118/104. Pre op HGA1C 5.3. Stop accu checks 7.Remove EPW in am 8.Possible discharge 1-2 days  ZIMMERMAN,DONIELLE MPA-C 01/18/2013,8:07 AM   Chart reviewed, patient examined, agree with above. He can go home in am if no changes.

## 2013-01-18 NOTE — Progress Notes (Signed)
Pt ambulating independently in hall with friend.

## 2013-01-19 ENCOUNTER — Encounter (HOSPITAL_COMMUNITY): Payer: Self-pay | Admitting: Cardiology

## 2013-01-19 DIAGNOSIS — I200 Unstable angina: Secondary | ICD-10-CM | POA: Diagnosis present

## 2013-01-19 DIAGNOSIS — I10 Essential (primary) hypertension: Secondary | ICD-10-CM

## 2013-01-19 DIAGNOSIS — Z951 Presence of aortocoronary bypass graft: Secondary | ICD-10-CM

## 2013-01-19 DIAGNOSIS — E785 Hyperlipidemia, unspecified: Secondary | ICD-10-CM | POA: Diagnosis present

## 2013-01-19 DIAGNOSIS — I2510 Atherosclerotic heart disease of native coronary artery without angina pectoris: Secondary | ICD-10-CM

## 2013-01-19 HISTORY — DX: Essential (primary) hypertension: I10

## 2013-01-19 HISTORY — DX: Hyperlipidemia, unspecified: E78.5

## 2013-01-19 HISTORY — DX: Presence of aortocoronary bypass graft: Z95.1

## 2013-01-19 HISTORY — DX: Atherosclerotic heart disease of native coronary artery without angina pectoris: I25.10

## 2013-01-19 MED ORDER — TRAMADOL HCL 50 MG PO TABS: 50.0000 mg | ORAL_TABLET | ORAL | Status: DC | PRN

## 2013-01-19 MED ORDER — ASPIRIN 325 MG PO TBEC: 325.0000 mg | DELAYED_RELEASE_TABLET | Freq: Every day | ORAL | Status: DC

## 2013-01-19 MED ORDER — METOPROLOL TARTRATE 12.5 MG HALF TABLET: 12.5000 mg | ORAL_TABLET | Freq: Two times a day (BID) | ORAL | Status: DC

## 2013-01-19 MED ORDER — FUROSEMIDE 40 MG PO TABS: 40.0000 mg | ORAL_TABLET | Freq: Every day | ORAL | Status: DC

## 2013-01-19 MED ORDER — POTASSIUM CHLORIDE CRYS ER 20 MEQ PO TBCR: 20.0000 meq | EXTENDED_RELEASE_TABLET | Freq: Every day | ORAL | Status: DC

## 2013-01-19 NOTE — Progress Notes (Signed)
Pt ambulated 500 ft independently and tolerated activity well. Will continue to monitor.  

## 2013-01-19 NOTE — Progress Notes (Signed)
Patient ambulated around the unit with nurse tech and tolerated well.  Steady on his feet, HR stable, no SOB.  Left resting in room with call bell within reach.  Will continue to monitor.  Arva Chafe

## 2013-01-19 NOTE — Care Management Note (Signed)
    Page 1 of 1   01/19/2013     2:45:54 PM   CARE MANAGEMENT NOTE 01/19/2013  Patient:  Derek Wells, Derek Wells   Account Number:  [id number]  Date Initiated:  01/19/2013  Documentation initiated by:  Moataz Tavis  Subjective/Objective Assessment:   PT S/P EMERGENT CABG X3 ON 01/16/13.  PTA, PT INDEPENDENT, LIVES WITH SPOUSE.     Action/Plan:   PT FOR DC HOME TODAY 4/29.  PT DENIES ANY HOME NEEDS.  WIFE TO PROVIDE 24HR CARE AT HOME.   Anticipated DC Date:  01/19/2013   Anticipated DC Plan:  HOME/SELF CARE      DC Planning Services  CM consult      Choice offered to / List presented to:             Status of service:  Completed, signed off Medicare Important Message given?   (If response is "NO", the following Medicare IM given date fields will be blank) Date Medicare IM given:   Date Additional Medicare IM given:    Discharge Disposition:  HOME/SELF CARE  Per UR Regulation:  Reviewed for med. necessity/level of care/duration of stay  If discussed at Long Length of Stay Meetings, dates discussed:    Comments:

## 2013-01-19 NOTE — Progress Notes (Signed)
EPWs and CTs removed per MD order and protocol. Wire ends intact. Pt tolerated procedure well. Sterri strips applied to CT sites. Will continue to monitor.

## 2013-01-19 NOTE — Progress Notes (Signed)
Subjective: S/P CABG X 3. POD# 4. Coronary Artery Bypass Graft times three using Right Greater Saphenous Vein Graft harvested endoscopically and Left Internal Mammary Artery and  No complaints except lack of sleep  Objective: Vital signs in last 24 hours: Temp:  [98 F (36.7 C)-98.6 F (37 C)] 98.5 F (36.9 C) (04/29 0604) Pulse Rate:  [73-80] 80 (04/29 0604) Resp:  [18-20] 18 (04/29 0604) BP: (129-130)/(53-61) 130/57 mmHg (04/29 0604) SpO2:  [96 %-97 %] 97 % (04/29 0604) Weight:  [237 lb 11.2 oz (107.82 kg)] 237 lb 11.2 oz (107.82 kg) (04/29 0601) Weight change: -2 lb 2.6 oz (-0.98 kg) Last BM Date:  (pre-op) Intake/Output from previous day: +1080 04/28 0701 - 04/29 0700 In: 1080 [P.O.:1080] Out: -  Intake/Output this shift:    PE: General:alert and oriented, no complaints Heart:S1S2 RRR Lungs:diminished rt base otherwise clear Abd:+ BS, soft, non tender Ext:no edema to trace    Lab Results:  Recent Labs  01/16/13 1525 01/17/13 0420  WBC 12.8* 11.2*  HGB 11.3* 10.9*  HCT 32.8* 30.9*  PLT 141* 135*   BMET  Recent Labs  01/16/13 1524 01/16/13 1525 01/17/13 0420  NA 139  --  137  K 3.9  --  4.0  CL 100  --  103  CO2  --   --  27  GLUCOSE 111*  --  130*  BUN 14  --  15  CREATININE 0.90 0.95 0.99  CALCIUM  --   --  8.3*   No results found for this basename: TROPONINI, CK, MB,  in the last 72 hours  No results found for this basename: CHOL,  HDL,  LDLCALC,  LDLDIRECT,  TRIG,  CHOLHDL   Lab Results  Component Value Date   HGBA1C  Value: 5.3 (NOTE) The ADA recommends the following therapeutic goal for glycemic control related to Hgb A1c measurement: Goal of therapy: <6.5 Hgb A1c  Reference: American Diabetes Association: Clinical Practice Recommendations 2010, Diabetes Care, 2010, 33: (Suppl  1). 01/09/2009     No results found for this basename: TSH      Studies/Results: Dg Chest 2 View  01/18/2013  *RADIOLOGY REPORT*  Clinical Data: Follow up  pneumothorax.  CHEST - 2 VIEW  Comparison: 01/17/2013.  Findings: No definite pneumothorax is identified.  Stable bibasilar atelectasis.  The right IJ catheters have been removed.  IMPRESSION:  1.  Persistent bibasilar atelectasis. 2.  Removal of IJ catheters. 3.  No definite right-sided pneumothorax.   Original Report Authenticated By: Rudie Meyer, M.D.     Medications: I have reviewed the patient's current medications. Marland Kitchen aspirin EC  325 mg Oral Daily  . atorvastatin  40 mg Oral Daily  . docusate sodium  200 mg Oral Daily  . enoxaparin (LOVENOX) injection  40 mg Subcutaneous QHS  . famotidine  20 mg Oral BID  . furosemide  40 mg Oral Daily  . lisinopril  2.5 mg Oral Daily  . metoprolol tartrate  12.5 mg Oral BID  . multivitamin with minerals  1 tablet Oral Daily  . niacin  500 mg Oral QHS  . omega-3 acid ethyl esters  1 g Oral Daily  . potassium chloride  40 mEq Oral Daily  . sodium chloride  3 mL Intravenous Q12H   Assessment/Plan: Principal Problem:   Angina pectoris, crescendo Active Problems:   CAD (coronary artery disease), severe needing CABG   S/P CABG x 3, LIMA-LAD, VG-OM, VG-PDA 01/15/13    HTN (hypertension)  Hyperlipidemia LDL goal < 70   PLAN: for discharge.  Will follow up with Dr. Tresa Endo or his PA/NP.    LOS: 4 days   Time spent with pt. :15 minutes. [facility] R  Nurse Practitioner Certified Pager [phone] 01/19/2013, 8:11 AM  I have seen and evaluated the patient this AM along with Nada Boozer, NP. I agree with her findings, examination as well as impression recommendations.  Came in for OP cath for SSx concerning for crescendo Angina --> LHC with LM & ostial LAD,Cx --> urgent CABG on Friday.  Looks well.  NO major concerns.  Tele,HR & BP stable.  Anticipate  D/c today on low dose BB & ACE-I --> will defer to Dr. Tresa Endo for OP titration.  On PO Lasix for mild volume overload. On statin & niacin & FAs.   Oral ASA.   Marykay Lex, M.D., M.S. [facility]  [facility] [address] [place], [place]  [address]  [phone] Pager # [phone] 01/19/2013 10:42 AM

## 2013-01-19 NOTE — Discharge Summary (Signed)
[address]       [place] [address]             [phone]     Derek Wells Dec 06, 1932 79 y.o. [id number]  01/15/2013   Lennette Bihari, MD  Chest pain / HPI: At time of consultation Dr Laneta Simmers was called to the Cath Lab by Dr. Tresa Endo to evaluate Derek Wells for coronary bypass surgery. He is a 79 year old gentleman with history of hypertension and hyperlipidemia as well as prior abdominal aortic aneurysm repair who has been having exertional chest discomfort as well as pain in his neck, left shoulder, and arm since last fall. He underwent a nuclear stress test which was negative for ischemia. He continued to have the symptoms and actually took a trip to [place] for 2 weeks having these symptoms prior to departure and during the trip. He has had continued progression of symptoms to the point where he is getting them with mild exertion such as walking at a normal pace. He returned to see Dr. Tresa Endo who thought that it was best to proceed with cardiac catheterization even though he had a negative stress test several months ago. Elective cardiac catheterization today showed a 60-70% ostial left main and 90% distal left main stenosis. The LAD had a 99% ostial stenosis. The right coronary was diffusely irregular with 50% ostial stenosis of the posterior descending branch. Left ventricular ejection fraction was normal. The patient was felt to require urgent surgery and taken to the cardiac operating room on the same day. Past Medical History   Diagnosis  Date   .  Hypertension    .  Coronary artery disease     Past Surgical History   Procedure  Laterality  Date   .  Vascular surgery      History reviewed. No pertinent family history.  Social History: reports that he has been smoking Cigars. He does not have any smokeless tobacco history on file. His alcohol and drug histories are not on file.  Allergies:  Allergies   Allergen  Reactions   .  Penicillins  Rash    Medications:    I have reviewed the patient's current medications.  Prior to Admission:  Prescriptions prior to admission   Medication  Sig  Dispense  Refill   .  aspirin EC 81 MG tablet  Take 81 mg by mouth daily.     Marland Kitchen  atorvastatin (LIPITOR) 40 MG tablet  Take 40 mg by mouth daily.     .  fish oil-omega-3 fatty acids 1000 MG capsule  Take 1 g by mouth daily.     .  Multiple Vitamin (MULTIVITAMIN WITH MINERALS) TABS  Take 1 tablet by mouth daily.     .  niacin (NIASPAN) 500 MG CR tablet  Take 500 mg by mouth at bedtime.     .  valsartan-hydrochlorothiazide (DIOVAN-HCT) 320-12.5 MG per tablet  Take 1 tablet by mouth daily.        Hospital Course: Following cardiac catheterization, the  patient was taken to the operating room on 01/15/2013 and underwent the following procedure: DATE OF PROCEDURE: 01/15/2013  DATE OF DISCHARGE:  OPERATIVE REPORT  PREOPERATIVE DIAGNOSIS: High-grade left main and 2-vessel coronary  artery disease.  POSTOPERATIVE DIAGNOSIS: High-grade left main and 2-vessel coronary  artery disease.  OPERATIVE PROCEDURE: Median sternotomy, extracorporeal circulation,  coronary artery bypass graft surgery x3 using a left internal mammary  artery graft to left anterior descending coronary artery, with  a  saphenous vein graft to the obtuse marginal branch of left circumflex  coronary artery, and a saphenous vein graft to the posterior descending  branch of the right coronary artery. Endoscopic vein harvesting from  the right leg.  ATTENDING SURGEON: Evelene Croon, M.D.  ASSISTANT: Doree Fudge, PA-C  ANESTHESIA: General endotracheal.  The  patient remained hemodynamically stable and transferred to the SICU in  guarded, but stable condition.   Postoperative hospital course:  Overall the patient progressed nicely. He was weaned from the ventilator without difficulty. All routine lines, monitors and drainage devices were discontinued in the standard fashion. He did have a moderate  volume overload but responded well to gentle diuresis. He a postoperative thrombocytopenia which stabilized. He an acute blood loss anemia which was stabilized. Blood sugars were under good control. He had no significant cardiac dysrhythmias. Incisions are felt to be healing without evidence of infection. He tolerating gradually increasing activities using standard protocols. Overall status is felt to be stable for discharge on 01/19/2013.        Recent Labs  01/16/13 1524 01/17/13 0420  NA 139 137  K 3.9 4.0  CL 100 103  CO2  --  27  GLUCOSE 111* 130*  BUN 14 15  CALCIUM  --  8.3*    Recent Labs  01/16/13 1525 01/17/13 0420  WBC 12.8* 11.2*  HGB 11.3* 10.9*  HCT 32.8* 30.9*  PLT 141* 135*   No results found for this basename: INR,  in the last 72 hours   Discharge Instructions:  The patient is discharged to home with extensive instructions on wound care and progressive ambulation.  They are instructed not to drive or perform any heavy lifting until returning to see the physician in his office.  Discharge Diagnosis:  Chest pain / CAD Secondary Diagnosis: Patient Active Problem List   Diagnosis Date Noted  . Angina pectoris, crescendo 01/19/2013   Past Medical History  Diagnosis Date  . Hypertension   . Coronary artery disease      Medication List    TAKE these medications       aspirin 325 MG EC tablet  Take 1 tablet (325 mg total) by mouth daily.     atorvastatin 40 MG tablet  Commonly known as:  LIPITOR  Take 40 mg by mouth daily.     fish oil-omega-3 fatty acids 1000 MG capsule  Take 1 g by mouth daily.     furosemide 40 MG tablet  Commonly known as:  LASIX  Take 1 tablet (40 mg total) by mouth daily.     metoprolol tartrate 12.5 mg Tabs  Commonly known as:  LOPRESSOR  Take 0.5 tablets (12.5 mg total) by mouth 2 (two) times daily.     multivitamin with minerals Tabs  Take 1 tablet by mouth daily.     niacin 500 MG CR tablet  Commonly  known as:  NIASPAN  Take 500 mg by mouth at bedtime.     potassium chloride SA 20 MEQ tablet  Commonly known as:  K-DUR,KLOR-CON  Take 1 tablet (20 mEq total) by mouth daily.     traMADol 50 MG tablet  Commonly known as:  ULTRAM  Take 1-2 tablets (50-100 mg total) by mouth every 4 (four) hours as needed.     valsartan-hydrochlorothiazide 320-12.5 MG per tablet  Commonly known as:  DIOVAN-HCT  Take 1 tablet by mouth daily.         Disposition: The patient has been discharged  on:   1.Beta Blocker:  Yes [  x ]                              No   [   ]                              If No, reason:  2.Ace Inhibitor/ARB: Yes [  x ]                                     No  [    ]                                     If No, reason:  3.Statin:   Yes [ x]                  No  [   ]                  If No, reason:  4.Ecasa:  Yes  [  x ]                  No   [   ]   Follow-up Information   Follow up with Alleen Borne, MD. (the office will contact you about appt to see Dr Laneta Simmers in 3 weeks with a chest xray from [facility])    Contact information:   [address] [employer] [place] [address] [phone]       Follow up with Lennette Bihari, MD. (2 weeks, please contact the office for appointment)    Contact information:   [address] [place] [address] [phone]         Patient's condition is Good  Gershon Crane, PA-C 01/19/2013  8:07 AM

## 2013-01-19 NOTE — Progress Notes (Signed)
[address]       [place] [address]             [phone]    4 Days Post-Op  Procedure(s) (LRB): Coronary Artery Bypass Graft times three using Right Greater Saphenous Vein Graft harvested endoscopically and Left Internal Mammary Artery and (N/A) INTRAOPERATIVE TRANSESOPHAGEAL ECHOCARDIOGRAM Subjective: Feels well  Objective  Telemetry sinus rhythm  Temp:  [98 F (36.7 C)-98.6 F (37 C)] 98.5 F (36.9 C) (04/29 0604) Pulse Rate:  [73-80] 80 (04/29 0604) Resp:  [18-20] 18 (04/29 0604) BP: (129-130)/(53-61) 130/57 mmHg (04/29 0604) SpO2:  [96 %-97 %] 97 % (04/29 0604) Weight:  [237 lb 11.2 oz (107.82 kg)] 237 lb 11.2 oz (107.82 kg) (04/29 0601)   Intake/Output Summary (Last 24 hours) at 01/19/13 0751 Last data filed at 01/18/13 1900  Gross per 24 hour  Intake   1080 ml  Output      0 ml  Net   1080 ml       General appearance: alert, cooperative and no distress Heart: regular rate and rhythm Lungs: clear to auscultation bilaterally Abdomen: benign Extremities: minor edema right leg Wound: incis healing well  Lab Results:  Recent Labs  01/16/13 1524 01/16/13 1525 01/17/13 0420  NA 139  --  137  K 3.9  --  4.0  CL 100  --  103  CO2  --   --  27  GLUCOSE 111*  --  130*  BUN 14  --  15  CREATININE 0.90 0.95 0.99  CALCIUM  --   --  8.3*  MG  --  2.4  --    No results found for this basename: AST, ALT, ALKPHOS, BILITOT, PROT, ALBUMIN,  in the last 72 hours No results found for this basename: LIPASE, AMYLASE,  in the last 72 hours  Recent Labs  01/16/13 1525 01/17/13 0420  WBC 12.8* 11.2*  HGB 11.3* 10.9*  HCT 32.8* 30.9*  MCV 90.4 89.8  PLT 141* 135*   No results found for this basename: CKTOTAL, CKMB, TROPONINI,  in the last 72 hours No components found with this basename: POCBNP,  No results found for this basename: DDIMER,  in the last 72 hours No results found for this basename: HGBA1C,  in the last 72 hours No results  found for this basename: CHOL, HDL, LDLCALC, TRIG, CHOLHDL,  in the last 72 hours No results found for this basename: TSH, T4TOTAL, FREET3, T3FREE, THYROIDAB,  in the last 72 hours No results found for this basename: VITAMINB12, FOLATE, FERRITIN, TIBC, IRON, RETICCTPCT,  in the last 72 hours  Medications: Scheduled . aspirin EC  325 mg Oral Daily  . atorvastatin  40 mg Oral Daily  . docusate sodium  200 mg Oral Daily  . enoxaparin (LOVENOX) injection  40 mg Subcutaneous QHS  . famotidine  20 mg Oral BID  . furosemide  40 mg Oral Daily  . lisinopril  2.5 mg Oral Daily  . metoprolol tartrate  12.5 mg Oral BID  . multivitamin with minerals  1 tablet Oral Daily  . niacin  500 mg Oral QHS  . omega-3 acid ethyl esters  1 g Oral Daily  . potassium chloride  40 mEq Oral Daily  . sodium chloride  3 mL Intravenous Q12H     Radiology/Studies:  Dg Chest 2 View  01/18/2013  *RADIOLOGY REPORT*  Clinical Data: Follow up pneumothorax.  CHEST - 2 VIEW  Comparison: 01/17/2013.  Findings:  No definite pneumothorax is identified.  Stable bibasilar atelectasis.  The right IJ catheters have been removed.  IMPRESSION:  1.  Persistent bibasilar atelectasis. 2.  Removal of IJ catheters. 3.  No definite right-sided pneumothorax.   Original Report Authenticated By: Rudie Meyer, M.D.     INR: Will add last result for INR, ABG once components are confirmed Will add last 4 CBG results once components are confirmed  Assessment/Plan: S/P Procedure(s) (LRB): Coronary Artery Bypass Graft times three using Right Greater Saphenous Vein Graft harvested endoscopically and Left Internal Mammary Artery and (N/A) INTRAOPERATIVE TRANSESOPHAGEAL ECHOCARDIOGRAM Plan for discharge: see discharge orders   LOS: 4 days    Derek Wells 4/29/20147:51 AM

## 2013-01-19 NOTE — Progress Notes (Signed)
[id number] Cardiac Rehab Completed discharge education with pt. Discussed smoking cessation with pt and gave him tips for quitting and coaching contact number. Pt states that he has quit. He agrees to [employer]. CRP in [place], will send referral. Placed recovery from OSH video for him to watch. Melina Copa RN

## 2013-02-01 ENCOUNTER — Other Ambulatory Visit: Payer: Self-pay | Admitting: Cardiovascular Disease

## 2013-02-01 LAB — CBC
HCT: 37.1 % — ABNORMAL LOW (ref 39.0–52.0)
Hemoglobin: 12.5 g/dL — ABNORMAL LOW (ref 13.0–17.0)
MCHC: 33.7 g/dL (ref 30.0–36.0)
MCV: 89.8 fL (ref 78.0–100.0)

## 2013-02-01 LAB — COMPREHENSIVE METABOLIC PANEL
Albumin: 4.3 g/dL (ref 3.5–5.2)
Alkaline Phosphatase: 89 U/L (ref 39–117)
BUN: 22 mg/dL (ref 6–23)
Calcium: 9.9 mg/dL (ref 8.4–10.5)
Glucose, Bld: 90 mg/dL (ref 70–99)
Potassium: 5 mEq/L (ref 3.5–5.3)

## 2013-02-02 ENCOUNTER — Encounter: Payer: Medicare Other | Admitting: Pharmacist Clinician (PhC)/ Clinical Pharmacy Specialist

## 2013-02-03 LAB — NMR LIPOPROFILE WITH LIPIDS
LDL (calc): 68 mg/dL (ref <100)
LDL Particle Number: 1007 nmol/L — ABNORMAL HIGH (ref <1000)
LDL Size: 20.5 nm — ABNORMAL LOW (ref >20.5)
LP-IR Score: 53 — ABNORMAL HIGH (ref <=45)
Small LDL Particle Number: 645 nmol/L — ABNORMAL HIGH (ref <=527)
VLDL Size: 44.5 nm (ref <=46.6)

## 2013-02-05 ENCOUNTER — Telehealth: Payer: Self-pay | Admitting: Cardiovascular Disease

## 2013-02-05 NOTE — Telephone Encounter (Signed)
Informed patient Dr. Tresa Endo has been on vacation this week and hasn't reviewed labs.

## 2013-02-05 NOTE — Telephone Encounter (Signed)
Message forwarded to Arby Barrette., CMA for Dr. Tresa Endo.

## 2013-02-05 NOTE — Telephone Encounter (Signed)
Wants lab results from Tuesday °

## 2013-02-08 ENCOUNTER — Other Ambulatory Visit: Payer: Self-pay | Admitting: *Deleted

## 2013-02-08 DIAGNOSIS — Z951 Presence of aortocoronary bypass graft: Secondary | ICD-10-CM

## 2013-02-08 DIAGNOSIS — I2510 Atherosclerotic heart disease of native coronary artery without angina pectoris: Secondary | ICD-10-CM

## 2013-02-10 ENCOUNTER — Ambulatory Visit (INDEPENDENT_AMBULATORY_CARE_PROVIDER_SITE_OTHER): Payer: Self-pay | Admitting: Surgery

## 2013-02-10 ENCOUNTER — Encounter: Payer: Medicare Other | Admitting: Pharmacist Clinician (PhC)/ Clinical Pharmacy Specialist

## 2013-02-10 ENCOUNTER — Ambulatory Visit
Admission: RE | Admit: 2013-02-10 | Discharge: 2013-02-10 | Disposition: A | Discharge status: Home / Self Care | Payer: Medicare Other | Source: Ambulatory Visit | Attending: Surgery | Admitting: Surgery

## 2013-02-10 ENCOUNTER — Encounter: Payer: Self-pay | Admitting: Surgery

## 2013-02-10 VITALS — BP 162/81 | HR 73 | Resp 16 | Ht 73.5 in | Wt 228.0 lb

## 2013-02-10 DIAGNOSIS — I2510 Atherosclerotic heart disease of native coronary artery without angina pectoris: Secondary | ICD-10-CM

## 2013-02-10 DIAGNOSIS — Z951 Presence of aortocoronary bypass graft: Secondary | ICD-10-CM

## 2013-02-10 NOTE — Progress Notes (Signed)
[address]       [place] [address]             [phone]      HPI:  Patient returns for routine postoperative follow-up having undergone coronary bypass graft surgery x3 on 01/15/2013. The patient's early postoperative recovery while in the hospital was notable for an uncomplicated postoperative course. Since hospital discharge the patient reports that he has been making continued progress in his recovery. He said that initially his blood pressure was dropping down in the low 90s and he was feeling dizzy. His Lasix was stopped and his Diovan/HCT was held with resolution of his dizziness. He has had some mild constipation. He said that he has had some anxiety but thinks that this will improve as he gets out of house more. He has been walking without chest pain or shortness of breath.   Current Outpatient Prescriptions  Medication Sig Dispense Refill  . aspirin EC 325 MG EC tablet Take 1 tablet (325 mg total) by mouth daily.  30 tablet    . atorvastatin (LIPITOR) 40 MG tablet Take 40 mg by mouth daily.      . fish oil-omega-3 fatty acids 1000 MG capsule Take 1 g by mouth daily.      . Multiple Vitamin (MULTIVITAMIN WITH MINERALS) TABS Take 1 tablet by mouth daily.      . niacin (NIASPAN) 500 MG CR tablet Take 500 mg by mouth at bedtime.      . traMADol (ULTRAM) 50 MG tablet Take 1-2 tablets (50-100 mg total) by mouth every 4 (four) hours as needed.  50 tablet  0  . metoprolol tartrate (LOPRESSOR) 12.5 mg TABS Take 0.5 tablets (12.5 mg total) by mouth 2 (two) times daily.  30 tablet  1  . valsartan-hydrochlorothiazide (DIOVAN-HCT) 320-12.5 MG per tablet Take 1 tablet by mouth daily.       No current facility-administered medications for this visit.    Physical Exam: BP 162/81  Pulse 73  Resp 16  Ht 6' 1.5" (1.867 m)  Wt 228 lb (103.42 kg)  BMI 29.67 kg/m2  SpO2 98% He looks well. Cardiac exam shows a regular rate and rhythm with normal heart sounds. Lung  exam is clear. The chest incision is healing well and sternum is stable. The right leg incision is healing well and there is no peripheral edema.  Diagnostic Tests:  *RADIOLOGY REPORT*   Clinical Data: Post CABG 3-4 weeks ago, some shortness of breath, former smoking history   CHEST - 2 VIEW   Comparison: Chest x-ray of 01/18/2013   Findings: The small pleural effusions have resolved.  Aeration has improved with resolution of basilar atelectasis.  Mild cardiomegaly is stable.  Median sternotomy sutures are noted from CABG.   IMPRESSION: Resolution of effusions and basilar atelectasis.  No active lung disease.     Original Report Authenticated By: Dwyane Dee, M.D.   Impression:  Overall I think he is making a good recovery following surgery. He is anxious to start cardiac rehabilitation and I think he is ready for that. His blood pressure is a little high today but his Diovan/HCT has been on hold. He does have some 160 mg pills at home and I told him he could restart these and see how his blood pressure settles out. He has a followup appointment with Dr. Tresa Endo in the near future and he can followup on his blood pressure and decide about increasing his medications further. I told  him he could return to driving a car but should refrain from lifting anything heavier than 10 pounds for 3 months after surgery. I refilled his prescription for Ultram 50 mg every 6 hours when necessary for pain #40.  Plan:  He will continue followup with Dr. Tresa Endo and will contact me if he develops any problems with his incisions.

## 2013-02-11 ENCOUNTER — Telehealth: Payer: Self-pay | Admitting: Cardiovascular Disease

## 2013-02-11 NOTE — Telephone Encounter (Signed)
Called pt . Mr Creger stated he saw Dr.Bartle yesterday. Dr Laneta Simmers reviewed his B/P and he instructed Mr. Perry to restart Diovan-HCT at 1/2 tablet daily. He also wants to next appt. 03/02/13 at 2:15. Informed about labs from 5/12,Dr Tresa Endo will discuss at next appointment.

## 2013-02-11 NOTE — Telephone Encounter (Signed)
Question about the dose of Diovan he should be taking?

## 2013-02-24 ENCOUNTER — Encounter: Payer: Self-pay | Admitting: *Deleted

## 2013-02-24 ENCOUNTER — Telehealth: Payer: Self-pay | Admitting: *Deleted

## 2013-02-24 NOTE — Telephone Encounter (Signed)
FAXED CARDIAC  REHAB  PRESCRIPTION  BACK TO Derek Wells @ [facility]. [phone].

## 2013-02-25 ENCOUNTER — Encounter (HOSPITAL_COMMUNITY)
Admission: RE | Admit: 2013-02-25 | Discharge: 2013-02-25 | Disposition: A | Discharge status: Home / Self Care | Payer: Medicare Other | Source: Ambulatory Visit | Attending: Cardiovascular Disease | Admitting: Cardiovascular Disease

## 2013-02-25 DIAGNOSIS — Z8249 Family history of ischemic heart disease and other diseases of the circulatory system: Secondary | ICD-10-CM | POA: Insufficient documentation

## 2013-02-25 DIAGNOSIS — Z5189 Encounter for other specified aftercare: Secondary | ICD-10-CM | POA: Insufficient documentation

## 2013-02-25 DIAGNOSIS — I200 Unstable angina: Secondary | ICD-10-CM | POA: Insufficient documentation

## 2013-02-25 DIAGNOSIS — I10 Essential (primary) hypertension: Secondary | ICD-10-CM | POA: Insufficient documentation

## 2013-02-25 DIAGNOSIS — I2510 Atherosclerotic heart disease of native coronary artery without angina pectoris: Secondary | ICD-10-CM | POA: Insufficient documentation

## 2013-02-25 NOTE — Progress Notes (Signed)
Cardiac Rehab Medication Review by a Pharmacist  Does the patient  feel that his/her medications are working for him/her?  Yes but feels he is taking too much  Has the patient been experiencing any side effects to the medications prescribed?  Yes - Niaspan = flushing  Does the patient measure his/her own blood pressure or blood glucose at home?  yes - BP once daily, sometimes twice  Does the patient have any problems obtaining medications due to transportation or finances?   no  Understanding of regimen: excellent Understanding of indications: excellent Potential of compliance: excellent    Pharmacist comments: No issues identified.      Fritschle, Elice Crigger 02/25/2013 8:18 AM

## 2013-03-01 ENCOUNTER — Encounter (HOSPITAL_COMMUNITY)
Admission: RE | Admit: 2013-03-01 | Discharge: 2013-03-01 | Disposition: A | Discharge status: Home / Self Care | Payer: Medicare Other | Source: Ambulatory Visit | Attending: Cardiovascular Disease | Admitting: Cardiovascular Disease

## 2013-03-01 NOTE — Progress Notes (Signed)
Pt started cardiac rehab today.  Pt tolerated light exercise without difficulty. Telemetry  Sinus Rhythm with a first degree heart block which has been previously documented, occasional PVC. Vital signs stable.  Mr Derek Wells has a post hospital follow up with Dr Tresa Endo tomorrow. Will fax exercise flow sheets to Dr. Landry Dyke office for review.

## 2013-03-02 ENCOUNTER — Encounter: Payer: Self-pay | Admitting: Cardiovascular Disease

## 2013-03-02 ENCOUNTER — Ambulatory Visit (INDEPENDENT_AMBULATORY_CARE_PROVIDER_SITE_OTHER): Payer: Medicare Other | Admitting: Cardiovascular Disease

## 2013-03-02 VITALS — BP 130/70 | HR 69 | Ht 73.0 in | Wt 229.9 lb

## 2013-03-02 DIAGNOSIS — Z951 Presence of aortocoronary bypass graft: Secondary | ICD-10-CM

## 2013-03-02 DIAGNOSIS — E785 Hyperlipidemia, unspecified: Secondary | ICD-10-CM

## 2013-03-02 DIAGNOSIS — I10 Essential (primary) hypertension: Secondary | ICD-10-CM

## 2013-03-02 DIAGNOSIS — I2510 Atherosclerotic heart disease of native coronary artery without angina pectoris: Secondary | ICD-10-CM

## 2013-03-02 MED ORDER — VALSARTAN-HYDROCHLOROTHIAZIDE 160-12.5 MG PO TABS: 1.0000 | ORAL_TABLET | Freq: Every day | ORAL | Status: DC

## 2013-03-02 NOTE — Patient Instructions (Signed)
Restart valsartan HCT 160/12.5 daily. Follow up with Dr. Tresa Endo in 3 months

## 2013-03-03 ENCOUNTER — Encounter (HOSPITAL_COMMUNITY)
Admission: RE | Admit: 2013-03-03 | Discharge: 2013-03-03 | Disposition: A | Discharge status: Home / Self Care | Payer: Medicare Other | Source: Ambulatory Visit | Attending: Cardiovascular Disease | Admitting: Cardiovascular Disease

## 2013-03-05 ENCOUNTER — Encounter (HOSPITAL_COMMUNITY)
Admission: RE | Admit: 2013-03-05 | Discharge: 2013-03-05 | Disposition: A | Discharge status: Home / Self Care | Payer: Medicare Other | Source: Ambulatory Visit | Attending: Cardiovascular Disease | Admitting: Cardiovascular Disease

## 2013-03-08 ENCOUNTER — Encounter (HOSPITAL_COMMUNITY)
Admission: RE | Admit: 2013-03-08 | Discharge: 2013-03-08 | Disposition: A | Discharge status: Home / Self Care | Payer: Medicare Other | Source: Ambulatory Visit | Attending: Cardiovascular Disease | Admitting: Cardiovascular Disease

## 2013-03-10 ENCOUNTER — Encounter (HOSPITAL_COMMUNITY)
Admission: RE | Admit: 2013-03-10 | Discharge: 2013-03-10 | Disposition: A | Discharge status: Home / Self Care | Payer: Medicare Other | Source: Ambulatory Visit | Attending: Cardiovascular Disease | Admitting: Cardiovascular Disease

## 2013-03-12 ENCOUNTER — Encounter (HOSPITAL_COMMUNITY)
Admission: RE | Admit: 2013-03-12 | Discharge: 2013-03-12 | Disposition: A | Discharge status: Home / Self Care | Payer: Medicare Other | Source: Ambulatory Visit | Attending: Cardiovascular Disease | Admitting: Cardiovascular Disease

## 2013-03-15 ENCOUNTER — Encounter (HOSPITAL_COMMUNITY)
Admission: RE | Admit: 2013-03-15 | Discharge: 2013-03-15 | Disposition: A | Discharge status: Home / Self Care | Payer: Medicare Other | Source: Ambulatory Visit | Attending: Cardiovascular Disease | Admitting: Cardiovascular Disease

## 2013-03-15 NOTE — Progress Notes (Signed)
Reviewed home exercise with pt today.  Pt plans to walk at home around neighborhood for exercise. Patient was encouraged to gradually increase time of exercise to 30 minutes and take rest breaks on hills.  Reviewed THR, pulse, RPE, sign and symptoms, NTG use, and when to call 911 or MD.  Pt voiced understanding.  Derek Pierce, MA, ACSM RCEP [place], [place]

## 2013-03-17 ENCOUNTER — Encounter (HOSPITAL_COMMUNITY)
Admission: RE | Admit: 2013-03-17 | Discharge: 2013-03-17 | Disposition: A | Discharge status: Home / Self Care | Payer: Medicare Other | Source: Ambulatory Visit | Attending: Cardiovascular Disease | Admitting: Cardiovascular Disease

## 2013-03-17 NOTE — Progress Notes (Addendum)
[name] 79 y.o. male Nutrition Note Spoke with pt.  Nutrition Plan and Nutrition Survey goals reviewed with pt. Pt is following Step 1 of the Therapeutic Lifestyle Changes diet. Pt wants to lose wt. Pt reports his UBW prior to his CABG was 240 lb. Pt states he lost 10 lb after surgery due to decreased appetite. Pt wants to "at least maintain 10 lb wt loss." Pt has been trying to lose wt by decreasing portion sizes eaten. Wt loss tips reviewed. Pt expressed understanding of the information reviewed. Pt aware of nutrition education classes offered and pt plans on attending classes. Nutrition Diagnosis   Food-and nutrition-related knowledge deficit related to lack of exposure to information as related to diagnosis of: ? CVD    Obesity related to excessive energy intake as evidenced by a BMI of 31.1  Nutrition RX/ Estimated Daily Nutrition Needs for: wt loss  1700-2200 Kcal, 45-60 gm fat, 11-15 gm sat fat, 1.7-2.2 gm trans-fat, <1500 mg sodium   Nutrition Intervention   Pt's individual nutrition plan including cholesterol goals reviewed with pt.   Benefits of adopting Therapeutic Lifestyle Changes discussed when Medficts reviewed.   Pt to attend the Portion Distortion class   Pt to attend the  ? Nutrition I class                     ? Nutrition II class   Pt given handouts for: ? Nutrition I class ? Nutrition II class    Continue client-centered nutrition education by RD, as part of interdisciplinary care.  Goal(s)   Pt to identify and limit food sources of saturated fat, trans fat, and cholesterol   Pt to identify food quantities necessary to achieve: ? wt loss to a goal wt of 207-230 lb (94.1-104.5 kg) at graduation from cardiac rehab.   Monitor and Evaluate progress toward nutrition goal with team. Nutrition Risk:  Low   Mickle Plumb, M.Ed, RD, LDN, CDE 03/17/2013 12:19 PM

## 2013-03-18 ENCOUNTER — Encounter: Payer: Self-pay | Admitting: Cardiovascular Disease

## 2013-03-18 NOTE — Progress Notes (Signed)
Patient ID: Derek Wells, male   DOB: [birth year], 79 y.o.   MRN: [id number]     HPI: [name], is a 79 y.o. male who has a history of hypertension, hyperlipidemia, and who had undergone prior abdominal aortic aneurysm repair in April 2010. I had seen him on 01/14/2013 at which time he had experienced depressive symptoms worrisome for unstable angina. He previously had undergone a nuclear perfusion study in September 2013 which was essentially normal although did show mild diaphragmatic attenuation. He also been found to have a atherogenic dyslipidemia pattern with reference to his lipid panel. Catheterization the following day on 01/15/2013 revealed life-threatening coronary anatomy with 60-70% ostial left main stenosis, 90-95% distal left main stenosis, 95-99% ostial LAD stenosis, and mild/moderate stenoses in the RCA. That same day he was successfully operated by Dr. Cristie Hem and underwent CABG x3 with a LIMA to the LAD, SVG to the OM, and SVG to the PDA. He had a unremarkable postoperative course and was discharged 4 days later.  He saw [name] on 01/27/13 and was doing well. He presents to the office today for evaluation. Mr. Chenier denies any further episodes of his left shoulder-like discomfort. He is participating now in cardiac rehabilitation. He just notes an occasional twinge of ache sensation of pain. He denies shortness of breath.  Past Medical History  Diagnosis Date  . Hypertension   . Coronary artery disease   . CAD (coronary artery disease), severe needing CABG 01/19/2013  . S/P CABG x 3 01/19/2013  . HTN (hypertension) 01/19/2013  . Hyperlipidemia LDL goal < 70 01/19/2013  . Chest pain     2D ECHO, 06/16/2012 - EF 50-55%, borderline low left ventricular systolic function  . Dyspnea on exertion     LEXISCAN, 06/17/2012 - fixed inferior bowel artifact and basal septal defect, probably related to IVCD/incomplete LBBB, no reversible ischemia, post-stress EF 66%  . History of AAA  (abdominal aortic aneurysm) repair     ABDOMINAL AORTIC DOPPLER, 06/16/2013 - normal    Past Surgical History  Procedure Laterality Date  . Vascular surgery    . Coronary artery bypass graft N/A 01/15/2013    Procedure: Coronary Artery Bypass Graft times three using Right Greater Saphenous Vein Graft harvested endoscopically and Left Internal Mammary Artery and;  Surgeon: Alleen Borne, MD;  Location: [facility] OR;  Service: Open Heart Surgery;  Laterality: N/A;  . Intraoperative transesophageal echocardiogram  01/15/2013    Procedure: INTRAOPERATIVE TRANSESOPHAGEAL ECHOCARDIOGRAM;  Surgeon: Alleen Borne, MD;  Location: [facility] OR;  Service: Open Heart Surgery;;  . Cardiac catheterization  01/15/2013    Severe coronary obstructive disease with 60-70% ostial left main and 90-85% distal LAD stenosis and mild-moderate stenoses in RCA, recommended for CABG    Allergies  Allergen Reactions  . Penicillins Rash    Current Outpatient Prescriptions  Medication Sig Dispense Refill  . aspirin 81 MG tablet Take 81 mg by mouth daily.      Marland Kitchen atorvastatin (LIPITOR) 40 MG tablet Take 40 mg by mouth daily.      . fish oil-omega-3 fatty acids 1000 MG capsule Take 1 g by mouth daily.      . metoprolol tartrate (LOPRESSOR) 12.5 mg TABS Take 0.5 tablets (12.5 mg total) by mouth 2 (two) times daily.  30 tablet  1  . Multiple Vitamin (MULTIVITAMIN WITH MINERALS) TABS Take 1 tablet by mouth daily.      . niacin (NIASPAN) 500 MG CR tablet Take 500  mg by mouth at bedtime.      . valsartan-hydrochlorothiazide (DIOVAN-HCT) 160-12.5 MG per tablet Take 1 tablet by mouth daily.  90 tablet  2   No current facility-administered medications for this visit.    Socially he is re-married. His first wife of 42 years passed away. He previously had smoked cigars prior to his bypass surgery but has not had any tobacco use since the   ROS is negative for fevers, chills or night sweats.  he denies palpitations. He denies wheezing. He  denies any of the discomfort that he experienced prior to his bypass surgery. Times he doesn't attend having some anxiety he denies GI symptoms. He denies abdominal pain. He denies significant edema.   Other system review is negative.  PE BP 130/70  Pulse 69  Ht 6\' 1"  (1.854 m)  Wt 229 lb 14.4 oz (104.282 kg)  BMI 30.34 kg/m2  General: Alert, oriented, no distress.  Skin: normal turgor, no rashes HEENT: Normocephalic, atraumatic. Pupils round and reactive; sclera anicteric;no lid lag.  Nose without nasal septal hypertrophy Mouth/Parynx benign; Mallinpatti scale 2 Neck: No JVD, no carotid briuts Lungs: clear to ausculatation and percussion; no wheezing or rales Heart: RRR, s1 s2 normal  1-2/6 systolic murmur  Abdomen: soft, nontender; no hepatosplenomehaly, BS+; abdominal aorta nontender and not dilated by palpation. Pulses 2+ Extremities: no clubbing cyanosis or edema, Homan's sign negative  Neurologic: grossly nonfocal  ECG: Sinus rhythm with first-degree AV block. Nonspecific interventricular block  LABS:  BMET    Component Value Date/Time   NA 134* 02/01/2013 1615   K 5.0 02/01/2013 1615   CL 99 02/01/2013 1615   CO2 27 02/01/2013 1615   GLUCOSE 90 02/01/2013 1615   BUN 22 02/01/2013 1615   CREATININE 1.11 02/01/2013 1615   CREATININE 0.99 01/17/2013 0420   CALCIUM 9.9 02/01/2013 1615   GFRNONAA 76* 01/17/2013 0420   GFRAA 89* 01/17/2013 0420     Hepatic Function Panel     Component Value Date/Time   PROT 7.2 02/01/2013 1615   ALBUMIN 4.3 02/01/2013 1615   AST 19 02/01/2013 1615   ALT 23 02/01/2013 1615   ALKPHOS 89 02/01/2013 1615   BILITOT 0.6 02/01/2013 1615     CBC    Component Value Date/Time   WBC 9.6 02/01/2013 1615   RBC 4.13* 02/01/2013 1615   HGB 12.5* 02/01/2013 1615   HCT 37.1* 02/01/2013 1615   PLT 542* 02/01/2013 1615   MCV 89.8 02/01/2013 1615   MCH 30.3 02/01/2013 1615   MCHC 33.7 02/01/2013 1615   RDW 13.8 02/01/2013 1615     BNP No results found for  this basename: probnp    Lipid Panel     Component Value Date/Time   TRIG 95 02/01/2013 1615   LDLCALC 68 02/01/2013 1615     RADIOLOGY: No results found.    ASSESSMENT AND PLAN:  Mr. Mimbs is now one month following his emergency CABG revascularization surgery after cardiac catheterization revealed severe or life threatening coronary anatomy. He is now participating in the cardiac rehabilitation program. He does have an atherogenic dyslipidemia lipid panel and for this reason is on combination therapy with niacin as well as atorvastatin. Target LDL particle numbers less than 1000 but optimally less than 700in patients with CAD.Marland Kitchen His blood pressure is well-controlled. He did his increased activity I discussed with him he'll it'll take at least 3 months from sternum to heal well that he should avoid resuming golf until at  least that time. Seen in 3 months for followup evaluation.     Lennette Bihari, MD, [facility]  03/18/2013 10:10 AM

## 2013-03-19 ENCOUNTER — Encounter (HOSPITAL_COMMUNITY)
Admission: RE | Admit: 2013-03-19 | Discharge: 2013-03-19 | Disposition: A | Discharge status: Home / Self Care | Payer: Medicare Other | Source: Ambulatory Visit | Attending: Cardiovascular Disease | Admitting: Cardiovascular Disease

## 2013-03-22 ENCOUNTER — Encounter (HOSPITAL_COMMUNITY)
Admission: RE | Admit: 2013-03-22 | Discharge: 2013-03-22 | Disposition: A | Discharge status: Home / Self Care | Payer: Medicare Other | Source: Ambulatory Visit | Attending: Cardiovascular Disease | Admitting: Cardiovascular Disease

## 2013-03-24 ENCOUNTER — Encounter (HOSPITAL_COMMUNITY)
Admission: RE | Admit: 2013-03-24 | Discharge: 2013-03-24 | Disposition: A | Discharge status: Home / Self Care | Payer: Medicare Other | Source: Ambulatory Visit | Attending: Cardiovascular Disease | Admitting: Cardiovascular Disease

## 2013-03-24 DIAGNOSIS — I200 Unstable angina: Secondary | ICD-10-CM | POA: Insufficient documentation

## 2013-03-24 DIAGNOSIS — I2510 Atherosclerotic heart disease of native coronary artery without angina pectoris: Secondary | ICD-10-CM | POA: Insufficient documentation

## 2013-03-24 DIAGNOSIS — I10 Essential (primary) hypertension: Secondary | ICD-10-CM | POA: Insufficient documentation

## 2013-03-24 DIAGNOSIS — Z8249 Family history of ischemic heart disease and other diseases of the circulatory system: Secondary | ICD-10-CM | POA: Insufficient documentation

## 2013-03-24 DIAGNOSIS — Z5189 Encounter for other specified aftercare: Secondary | ICD-10-CM | POA: Insufficient documentation

## 2013-03-29 ENCOUNTER — Encounter (HOSPITAL_COMMUNITY): Payer: Medicare Other

## 2013-03-31 ENCOUNTER — Encounter (HOSPITAL_COMMUNITY): Payer: Medicare Other

## 2013-04-02 ENCOUNTER — Encounter (HOSPITAL_COMMUNITY): Payer: Medicare Other

## 2013-04-05 ENCOUNTER — Encounter (HOSPITAL_COMMUNITY): Payer: Medicare Other

## 2013-04-07 ENCOUNTER — Encounter (HOSPITAL_COMMUNITY): Payer: Medicare Other

## 2013-04-09 ENCOUNTER — Encounter (HOSPITAL_COMMUNITY)
Admission: RE | Admit: 2013-04-09 | Discharge: 2013-04-09 | Disposition: A | Discharge status: Home / Self Care | Payer: Medicare Other | Source: Ambulatory Visit | Attending: Cardiovascular Disease | Admitting: Cardiovascular Disease

## 2013-04-09 ENCOUNTER — Encounter (HOSPITAL_COMMUNITY): Payer: Medicare Other

## 2013-04-12 ENCOUNTER — Encounter (HOSPITAL_COMMUNITY)
Admission: RE | Admit: 2013-04-12 | Discharge: 2013-04-12 | Disposition: A | Discharge status: Home / Self Care | Payer: Medicare Other | Source: Ambulatory Visit | Attending: Cardiovascular Disease | Admitting: Cardiovascular Disease

## 2013-04-14 ENCOUNTER — Encounter (HOSPITAL_COMMUNITY)
Admission: RE | Admit: 2013-04-14 | Discharge: 2013-04-14 | Disposition: A | Discharge status: Home / Self Care | Payer: Medicare Other | Source: Ambulatory Visit | Attending: Cardiovascular Disease | Admitting: Cardiovascular Disease

## 2013-04-16 ENCOUNTER — Encounter (HOSPITAL_COMMUNITY)
Admission: RE | Admit: 2013-04-16 | Discharge: 2013-04-16 | Disposition: A | Discharge status: Home / Self Care | Payer: Medicare Other | Source: Ambulatory Visit | Attending: Cardiovascular Disease | Admitting: Cardiovascular Disease

## 2013-04-19 ENCOUNTER — Encounter (HOSPITAL_COMMUNITY)
Admission: RE | Admit: 2013-04-19 | Discharge: 2013-04-19 | Disposition: A | Discharge status: Home / Self Care | Payer: Medicare Other | Source: Ambulatory Visit | Attending: Cardiovascular Disease | Admitting: Cardiovascular Disease

## 2013-04-21 ENCOUNTER — Encounter (HOSPITAL_COMMUNITY)
Admission: RE | Admit: 2013-04-21 | Discharge: 2013-04-21 | Disposition: A | Discharge status: Home / Self Care | Payer: Medicare Other | Source: Ambulatory Visit | Attending: Cardiovascular Disease | Admitting: Cardiovascular Disease

## 2013-04-23 ENCOUNTER — Encounter (HOSPITAL_COMMUNITY): Payer: Medicare Other

## 2013-04-23 ENCOUNTER — Telehealth (HOSPITAL_COMMUNITY): Payer: Self-pay | Admitting: Internal Medicine

## 2013-04-26 ENCOUNTER — Encounter (HOSPITAL_COMMUNITY)
Admission: RE | Admit: 2013-04-26 | Discharge: 2013-04-26 | Disposition: A | Discharge status: Home / Self Care | Payer: Medicare Other | Source: Ambulatory Visit | Attending: Cardiovascular Disease | Admitting: Cardiovascular Disease

## 2013-04-26 DIAGNOSIS — I10 Essential (primary) hypertension: Secondary | ICD-10-CM | POA: Insufficient documentation

## 2013-04-26 DIAGNOSIS — Z8249 Family history of ischemic heart disease and other diseases of the circulatory system: Secondary | ICD-10-CM | POA: Insufficient documentation

## 2013-04-26 DIAGNOSIS — I2510 Atherosclerotic heart disease of native coronary artery without angina pectoris: Secondary | ICD-10-CM | POA: Insufficient documentation

## 2013-04-26 DIAGNOSIS — I200 Unstable angina: Secondary | ICD-10-CM | POA: Insufficient documentation

## 2013-04-26 DIAGNOSIS — Z5189 Encounter for other specified aftercare: Secondary | ICD-10-CM | POA: Insufficient documentation

## 2013-04-28 ENCOUNTER — Encounter (HOSPITAL_COMMUNITY)
Admission: RE | Admit: 2013-04-28 | Discharge: 2013-04-28 | Disposition: A | Discharge status: Home / Self Care | Payer: Medicare Other | Source: Ambulatory Visit | Attending: Cardiovascular Disease | Admitting: Cardiovascular Disease

## 2013-04-30 ENCOUNTER — Encounter (HOSPITAL_COMMUNITY): Payer: Medicare Other

## 2013-04-30 ENCOUNTER — Encounter (HOSPITAL_COMMUNITY)
Admission: RE | Admit: 2013-04-30 | Discharge: 2013-04-30 | Disposition: A | Discharge status: Home / Self Care | Payer: Medicare Other | Source: Ambulatory Visit | Attending: Cardiovascular Disease | Admitting: Cardiovascular Disease

## 2013-05-03 ENCOUNTER — Encounter (HOSPITAL_COMMUNITY)
Admission: RE | Admit: 2013-05-03 | Discharge: 2013-05-03 | Disposition: A | Discharge status: Home / Self Care | Payer: Medicare Other | Source: Ambulatory Visit | Attending: Cardiovascular Disease | Admitting: Cardiovascular Disease

## 2013-05-03 ENCOUNTER — Encounter (HOSPITAL_COMMUNITY): Payer: Medicare Other

## 2013-05-05 ENCOUNTER — Encounter (HOSPITAL_COMMUNITY): Payer: Medicare Other

## 2013-05-05 ENCOUNTER — Encounter (HOSPITAL_COMMUNITY)
Admission: RE | Admit: 2013-05-05 | Discharge: 2013-05-05 | Disposition: A | Discharge status: Home / Self Care | Payer: Medicare Other | Source: Ambulatory Visit | Attending: Cardiovascular Disease | Admitting: Cardiovascular Disease

## 2013-05-07 ENCOUNTER — Encounter (HOSPITAL_COMMUNITY): Payer: Medicare Other

## 2013-05-10 ENCOUNTER — Encounter (HOSPITAL_COMMUNITY): Payer: Medicare Other

## 2013-05-10 ENCOUNTER — Encounter (HOSPITAL_COMMUNITY)
Admission: RE | Admit: 2013-05-10 | Discharge: 2013-05-10 | Disposition: A | Discharge status: Home / Self Care | Payer: Medicare Other | Source: Ambulatory Visit | Attending: Cardiovascular Disease | Admitting: Cardiovascular Disease

## 2013-05-12 ENCOUNTER — Encounter (HOSPITAL_COMMUNITY): Payer: Medicare Other

## 2013-05-12 ENCOUNTER — Encounter (HOSPITAL_COMMUNITY)
Admission: RE | Admit: 2013-05-12 | Discharge: 2013-05-12 | Disposition: A | Discharge status: Home / Self Care | Payer: Medicare Other | Source: Ambulatory Visit | Attending: Cardiovascular Disease | Admitting: Cardiovascular Disease

## 2013-05-14 ENCOUNTER — Encounter (HOSPITAL_COMMUNITY): Payer: Medicare Other

## 2013-05-14 ENCOUNTER — Encounter (HOSPITAL_COMMUNITY)
Admission: RE | Admit: 2013-05-14 | Discharge: 2013-05-14 | Disposition: A | Discharge status: Home / Self Care | Payer: Medicare Other | Source: Ambulatory Visit | Attending: Cardiovascular Disease | Admitting: Cardiovascular Disease

## 2013-05-17 ENCOUNTER — Encounter (HOSPITAL_COMMUNITY)
Admission: RE | Admit: 2013-05-17 | Discharge: 2013-05-17 | Disposition: A | Discharge status: Home / Self Care | Payer: Medicare Other | Source: Ambulatory Visit | Attending: Cardiovascular Disease | Admitting: Cardiovascular Disease

## 2013-05-17 ENCOUNTER — Encounter (HOSPITAL_COMMUNITY): Payer: Medicare Other

## 2013-05-19 ENCOUNTER — Encounter (HOSPITAL_COMMUNITY): Payer: Medicare Other

## 2013-05-19 ENCOUNTER — Encounter (HOSPITAL_COMMUNITY)
Admission: RE | Admit: 2013-05-19 | Discharge: 2013-05-19 | Disposition: A | Discharge status: Home / Self Care | Payer: Medicare Other | Source: Ambulatory Visit | Attending: Cardiovascular Disease | Admitting: Cardiovascular Disease

## 2013-05-21 ENCOUNTER — Encounter (HOSPITAL_COMMUNITY): Payer: Medicare Other

## 2013-05-21 ENCOUNTER — Encounter (HOSPITAL_COMMUNITY)
Admission: RE | Admit: 2013-05-21 | Discharge: 2013-05-21 | Disposition: A | Discharge status: Home / Self Care | Payer: Medicare Other | Source: Ambulatory Visit | Attending: Cardiovascular Disease | Admitting: Cardiovascular Disease

## 2013-05-24 ENCOUNTER — Encounter (HOSPITAL_COMMUNITY): Payer: Medicare Other

## 2013-05-24 DIAGNOSIS — Z5189 Encounter for other specified aftercare: Secondary | ICD-10-CM | POA: Insufficient documentation

## 2013-05-24 DIAGNOSIS — Z8249 Family history of ischemic heart disease and other diseases of the circulatory system: Secondary | ICD-10-CM | POA: Insufficient documentation

## 2013-05-24 DIAGNOSIS — I200 Unstable angina: Secondary | ICD-10-CM | POA: Insufficient documentation

## 2013-05-24 DIAGNOSIS — I2510 Atherosclerotic heart disease of native coronary artery without angina pectoris: Secondary | ICD-10-CM | POA: Insufficient documentation

## 2013-05-24 DIAGNOSIS — I10 Essential (primary) hypertension: Secondary | ICD-10-CM | POA: Insufficient documentation

## 2013-05-26 ENCOUNTER — Encounter (HOSPITAL_COMMUNITY): Payer: Medicare Other

## 2013-05-26 ENCOUNTER — Encounter (HOSPITAL_COMMUNITY)
Admission: RE | Admit: 2013-05-26 | Discharge: 2013-05-26 | Disposition: A | Discharge status: Home / Self Care | Payer: Medicare Other | Source: Ambulatory Visit | Attending: Cardiovascular Disease | Admitting: Cardiovascular Disease

## 2013-05-28 ENCOUNTER — Encounter (HOSPITAL_COMMUNITY): Payer: Medicare Other

## 2013-05-28 ENCOUNTER — Encounter (HOSPITAL_COMMUNITY)
Admission: RE | Admit: 2013-05-28 | Discharge: 2013-05-28 | Disposition: A | Discharge status: Home / Self Care | Payer: Medicare Other | Source: Ambulatory Visit | Attending: Cardiovascular Disease | Admitting: Cardiovascular Disease

## 2013-05-31 ENCOUNTER — Encounter (HOSPITAL_COMMUNITY): Payer: Medicare Other

## 2013-05-31 ENCOUNTER — Encounter (HOSPITAL_COMMUNITY)
Admission: RE | Admit: 2013-05-31 | Discharge: 2013-05-31 | Disposition: A | Discharge status: Home / Self Care | Payer: Medicare Other | Source: Ambulatory Visit | Attending: Cardiovascular Disease | Admitting: Cardiovascular Disease

## 2013-05-31 NOTE — Progress Notes (Signed)
Pt completed his exit bike test in preparation for discharge in two weeks. Pt exercise heart rate increased to 140-150 well over his target heart rate.  Pt complained of some shortness of breath.  Heart rate returned to within his target heart rate with rest.  Pt denied any other complaint.  Will fax ekg strip over to Dr. Tresa Endo for review.

## 2013-06-02 ENCOUNTER — Encounter (HOSPITAL_COMMUNITY)
Admission: RE | Admit: 2013-06-02 | Discharge: 2013-06-02 | Disposition: A | Discharge status: Home / Self Care | Payer: Medicare Other | Source: Ambulatory Visit | Attending: Cardiovascular Disease | Admitting: Cardiovascular Disease

## 2013-06-02 ENCOUNTER — Encounter (HOSPITAL_COMMUNITY): Payer: Medicare Other

## 2013-06-04 ENCOUNTER — Encounter: Payer: Self-pay | Admitting: Cardiovascular Disease

## 2013-06-04 ENCOUNTER — Encounter (HOSPITAL_COMMUNITY)
Admission: RE | Admit: 2013-06-04 | Discharge: 2013-06-04 | Disposition: A | Discharge status: Home / Self Care | Payer: Medicare Other | Source: Ambulatory Visit | Attending: Cardiovascular Disease | Admitting: Cardiovascular Disease

## 2013-06-04 ENCOUNTER — Ambulatory Visit (INDEPENDENT_AMBULATORY_CARE_PROVIDER_SITE_OTHER): Payer: Medicare Other | Admitting: Cardiovascular Disease

## 2013-06-04 ENCOUNTER — Encounter (HOSPITAL_COMMUNITY): Payer: Medicare Other

## 2013-06-04 VITALS — BP 122/70 | Ht 73.5 in | Wt 229.9 lb

## 2013-06-04 DIAGNOSIS — E785 Hyperlipidemia, unspecified: Secondary | ICD-10-CM

## 2013-06-04 DIAGNOSIS — Z951 Presence of aortocoronary bypass graft: Secondary | ICD-10-CM

## 2013-06-04 DIAGNOSIS — I10 Essential (primary) hypertension: Secondary | ICD-10-CM

## 2013-06-04 DIAGNOSIS — I2510 Atherosclerotic heart disease of native coronary artery without angina pectoris: Secondary | ICD-10-CM

## 2013-06-04 MED ORDER — METOPROLOL SUCCINATE ER 25 MG PO TB24: ORAL_TABLET | ORAL | Status: DC

## 2013-06-04 NOTE — Progress Notes (Signed)
Patient ID: Derek Wells, male   DOB: 1932/12/04, 79 y.o.   MRN: [id number]     HPI: [name], is a 79 y.o. male who resents to the office today for a three-month followup cardiology evaluation. Mr. Weyer has a history of hypertension, hyperlipidemia, and who had undergone prior abdominal aortic aneurysm repair in April 2010. I had seen him on 01/14/2013 at which time he had experienced depressive symptoms worrisome for unstable angina. He previously had undergone a nuclear perfusion study in September 2013 which was essentially normal although did show mild diaphragmatic attenuation. He also been found to have a atherogenic dyslipidemia pattern with reference to his lipid panel. Catheterization the following day on 01/15/2013 revealed life-threatening coronary anatomy with 60-70% ostial left main stenosis, 90-95% distal left main stenosis, 95-99% ostial LAD stenosis, and mild/moderate stenoses in the RCA. That same day he was successfully operated by Dr. Rexanne Mano and underwent CABG x3 with a LIMA to the LAD, SVG to the OM, and SVG to the PDA. He had a unremarkable postoperative course and was discharged 4 days later.  He saw [name] on 01/27/13 and was doing well. I saw him in followup on 03/02/2013. He is participating now in cardiac rehabilitation and will be graduating from this session next week. He was questioning today whether or not he should enroll in the subsequent maintenance program. He denies recurrent anginal type symptoms. He is now resumed playing golf. He does note some fatigue. He denies recurrent chest tightness. He denies palpitations. He denies significant edema.   Past Medical History  Diagnosis Date  . Hypertension   . Coronary artery disease   . CAD (coronary artery disease), severe needing CABG 01/19/2013  . S/P CABG x 3 01/19/2013  . HTN (hypertension) 01/19/2013  . Hyperlipidemia LDL goal < 70 01/19/2013  . Chest pain     2D ECHO, 06/16/2012 - EF 50-55%, borderline low  left ventricular systolic function  . Dyspnea on exertion     LEXISCAN, 06/17/2012 - fixed inferior bowel artifact and basal septal defect, probably related to IVCD/incomplete LBBB, no reversible ischemia, post-stress EF 66%  . History of AAA (abdominal aortic aneurysm) repair     ABDOMINAL AORTIC DOPPLER, 06/16/2013 - normal    Past Surgical History  Procedure Laterality Date  . Vascular surgery    . Coronary artery bypass graft N/A 01/15/2013    Procedure: Coronary Artery Bypass Graft times three using Right Greater Saphenous Vein Graft harvested endoscopically and Left Internal Mammary Artery and;  Surgeon: Alleen Borne, MD;  Location: [facility] OR;  Service: Open Heart Surgery;  Laterality: N/A;  . Intraoperative transesophageal echocardiogram  01/15/2013    Procedure: INTRAOPERATIVE TRANSESOPHAGEAL ECHOCARDIOGRAM;  Surgeon: Alleen Borne, MD;  Location: [facility] OR;  Service: Open Heart Surgery;;  . Cardiac catheterization  01/15/2013    Severe coronary obstructive disease with 60-70% ostial left main and 90-85% distal LAD stenosis and mild-moderate stenoses in RCA, recommended for CABG    Allergies  Allergen Reactions  . Penicillins Rash    Current Outpatient Prescriptions  Medication Sig Dispense Refill  . aspirin 81 MG tablet Take 81 mg by mouth daily.      Marland Kitchen atorvastatin (LIPITOR) 40 MG tablet Take 40 mg by mouth daily.      . fish oil-omega-3 fatty acids 1000 MG capsule Take 1 g by mouth daily.      . Multiple Vitamin (MULTIVITAMIN WITH MINERALS) TABS Take 1 tablet by mouth daily.      Marland Kitchen  valsartan-hydrochlorothiazide (DIOVAN-HCT) 160-12.5 MG per tablet Take 1 tablet by mouth daily.  90 tablet  2  . metoprolol succinate (TOPROL XL) 25 MG 24 hr tablet Take 1/2 tablet daily  15 tablet  6   No current facility-administered medications for this visit.    Socially he is re-married. His first wife of 42 years passed away. He previously had smoked cigars prior to his bypass surgery but has not  had any tobacco use since.  ROS is negative for fevers, chills or night sweats. He denies visual symptoms. He denies palpitations. He denies wheezing. He denies any of the discomfort that he experienced prior to his bypass surgery. He denies changes in bowel or bladder habits. There is no bleeding. He denies myalgias. He did have significant flushing related to Niaspan and  he discontinued this therapy and denies recurrent flushing episodes. He denies GI symptoms. He denies abdominal pain. He denies significant edema.   Other system review is negative.  PE BP 122/70  Ht 6' 1.5" (1.867 m)  Wt 229 lb 14.4 oz (104.282 kg)  BMI 29.92 kg/m2  General: Alert, oriented, no distress.  Skin: normal turgor, no rashes HEENT: Normocephalic, atraumatic. Pupils round and reactive; sclera anicteric;no lid lag.  Nose without nasal septal hypertrophy Mouth/Parynx benign; Mallinpatti scale 2 Neck: No JVD, no carotid briuts Lungs: clear to ausculatation and percussion; no wheezing or rales Heart: RRR, s1 s2 normal  1-2/6 systolic murmur  Abdomen: soft, nontender; no hepatosplenomehaly, BS+; abdominal aorta nontender and not dilated by palpation. Pulses 2+ Extremities: no clubbing cyanosis or edema, Homan's sign negative  Neurologic: grossly nonfocal  ECG: Sinus rhythm with first-degree AV block. Nonspecific interventricular block  LABS:  BMET    Component Value Date/Time   NA 134* 02/01/2013 1615   K 5.0 02/01/2013 1615   CL 99 02/01/2013 1615   CO2 27 02/01/2013 1615   GLUCOSE 90 02/01/2013 1615   BUN 22 02/01/2013 1615   CREATININE 1.11 02/01/2013 1615   CREATININE 0.99 01/17/2013 0420   CALCIUM 9.9 02/01/2013 1615   GFRNONAA 76* 01/17/2013 0420   GFRAA 89* 01/17/2013 0420     Hepatic Function Panel     Component Value Date/Time   PROT 7.2 02/01/2013 1615   ALBUMIN 4.3 02/01/2013 1615   AST 19 02/01/2013 1615   ALT 23 02/01/2013 1615   ALKPHOS 89 02/01/2013 1615   BILITOT 0.6 02/01/2013 1615      CBC    Component Value Date/Time   WBC 9.6 02/01/2013 1615   RBC 4.13* 02/01/2013 1615   HGB 12.5* 02/01/2013 1615   HCT 37.1* 02/01/2013 1615   PLT 542* 02/01/2013 1615   MCV 89.8 02/01/2013 1615   MCH 30.3 02/01/2013 1615   MCHC 33.7 02/01/2013 1615   RDW 13.8 02/01/2013 1615     BNP No results found for this basename: probnp    Lipid Panel     Component Value Date/Time   TRIG 95 02/01/2013 1615   LDLCALC 68 02/01/2013 1615     RADIOLOGY: No results found.    ASSESSMENT AND PLAN: Mr. Roe Coombs continues to do well now 4-1/2 months status post emergency redo revascularization surgery after cardiac catheterization demonstrated severe life threatening coronary anatomy. On prior lipid panel he was found to have an atherogenic dyslipidemia pattern. He apparently could not tolerate Niaspan in addition to his statin but seems to do well with his continued dose of atorvastatin 40 mg daily. He will be completing  phase II  of the cardiac rehabilitation program next week. He is contemplating enrolling for sometime into the maintenance phase. He is now having activity and playing golf. He denies chest soreness. At times he is only intermittently been taking his metoprolol tartrate once or twice a day. I am electing to change this to metoprolol succinate 12.5 mg to take once a day. He does have first degree AV block. His blood pressure today is controlled. I scheduled him for a complete set of laboratory including a followup NMR LipoProfile,  Cmet, CBC, hemoglobin A1c, and TSH level. As long as he remains stable I will see him in 6 months for followup evaluation.   Lennette Bihari, MD, [facility]  06/04/2013 3:36 PM

## 2013-06-04 NOTE — Patient Instructions (Addendum)
Your physician recommends that you return for lab work fasting.  Your physician recommends that you schedule a follow-up appointment in: 6 MONTHS.  Your physician has recommended you make the following change in your medication: metoprolol was changed to "long acting". This has already been sent to the pharmacy. Take as directed on the bottle.

## 2013-06-07 ENCOUNTER — Telehealth (HOSPITAL_COMMUNITY): Payer: Self-pay | Admitting: Internal Medicine

## 2013-06-07 ENCOUNTER — Encounter (HOSPITAL_COMMUNITY): Payer: Medicare Other

## 2013-06-09 ENCOUNTER — Encounter (HOSPITAL_COMMUNITY): Payer: Medicare Other

## 2013-06-11 ENCOUNTER — Encounter (HOSPITAL_COMMUNITY)
Admission: RE | Admit: 2013-06-11 | Discharge: 2013-06-11 | Disposition: A | Discharge status: Home / Self Care | Payer: Medicare Other | Source: Ambulatory Visit | Attending: Cardiovascular Disease | Admitting: Cardiovascular Disease

## 2013-06-14 ENCOUNTER — Telehealth: Payer: Self-pay | Admitting: *Deleted

## 2013-06-14 ENCOUNTER — Encounter (HOSPITAL_COMMUNITY)
Admission: RE | Admit: 2013-06-14 | Discharge: 2013-06-14 | Disposition: A | Discharge status: Home / Self Care | Payer: Medicare Other | Source: Ambulatory Visit | Attending: Cardiovascular Disease | Admitting: Cardiovascular Disease

## 2013-06-14 LAB — COMPREHENSIVE METABOLIC PANEL
AST: 14 U/L (ref 0–37)
Albumin: 4 g/dL (ref 3.5–5.2)
BUN: 15 mg/dL (ref 6–23)
Calcium: 9.7 mg/dL (ref 8.4–10.5)
Chloride: 104 mEq/L (ref 96–112)
Glucose, Bld: 109 mg/dL — ABNORMAL HIGH (ref 70–99)
Potassium: 4.6 mEq/L (ref 3.5–5.3)

## 2013-06-14 NOTE — Progress Notes (Signed)
Pt graduated today with the completion of 35 exercise classes in phase II.  Pt elected to graduate early today due to previous scheduled vacation out of town.  Medication list reconciled.  Pt with question regarding recent medication change with his beta blocker therapy.  In basket message sent to Dr. Tresa Endo to clarify what dose pt should take.  Pt desires to attend Cardiac Rehab Maintenance program for continued home exercise.  Coordinator notified of request.  Repeat PHQ2 completed  Score 0.  Pt has made significant progress with his exercise and heart healthy lifestyle changes.  Continue to monitor.

## 2013-06-14 NOTE — Telephone Encounter (Signed)
Faxed signed cardiac rehab maintenance program form back.

## 2013-06-15 LAB — NMR LIPOPROFILE WITH LIPIDS
Cholesterol, Total: 152 mg/dL (ref <200)
HDL Particle Number: 33 umol/L (ref >=30.5)
Large VLDL-P: 5.6 nmol/L — ABNORMAL HIGH (ref <=2.7)
Small LDL Particle Number: 712 nmol/L — ABNORMAL HIGH (ref <=527)

## 2013-06-16 ENCOUNTER — Encounter (HOSPITAL_COMMUNITY): Payer: Medicare Other

## 2013-06-17 ENCOUNTER — Telehealth: Payer: Self-pay | Admitting: Pharmacist Clinician (PhC)/ Clinical Pharmacy Specialist

## 2013-06-17 NOTE — Telephone Encounter (Signed)
Message copied by Rosalee Kaufman on Thu Jun 17, 2013 10:48 AM ------      Message from: Karlene Lineman B      Created: Mon Jun 14, 2013 11:45 AM      Regarding: ? dose of Toprol XL       Pt seen in office 06/04/13 with noted change in his beta blocker therapy.  Pt instructed to take 1/2 of the 25 mg once a day - he was taking 12.5 of short acting twice a day. Pharmacist where he gets his meds filled questioned the dosage.  Pt also wondered why the dose was only a half when he took a total of 25 mg daily.  Please have someone contact him to clarify what he should do.  This is his last day of cardiac rehab.   Pt can be best reached at [phone].            Thanks so much      Pharmacist, hospital ------

## 2013-06-17 NOTE — Telephone Encounter (Signed)
Reviewed Dr. Tresa Endo office note from 9/12 - spoke with patient and clarified that dose should be 12.5mg  of metoprolol succinate qd.  Pt had some initial confulsion, but it was explained that the tartrate tablets only work for 12 hrs and that with this new formulation he would get better regulation of the medicine.  Pt voiced understanding

## 2013-06-18 ENCOUNTER — Encounter (HOSPITAL_COMMUNITY): Payer: Medicare Other

## 2013-06-18 NOTE — Progress Notes (Signed)
Quick Note:  Lab results discussed with patient. All questions were answered to his satisfaction. Dr. Landry Dyke recommendations given to patient. He states that he [place] the beach and will "check in" with Dr. Kevan Ny once he returns. Requested that I send a copy of the bloodwork to him. He will get back to me on what he decides to do concerning the medication changes Dr. Tresa Endo suggested. ______

## 2013-06-21 ENCOUNTER — Encounter (HOSPITAL_COMMUNITY): Payer: Medicare Other

## 2013-06-23 ENCOUNTER — Encounter (HOSPITAL_COMMUNITY): Payer: Medicare Other

## 2013-06-25 ENCOUNTER — Encounter (HOSPITAL_COMMUNITY): Payer: Medicare Other

## 2013-06-28 ENCOUNTER — Encounter (HOSPITAL_COMMUNITY): Payer: Medicare Other

## 2013-06-30 ENCOUNTER — Encounter (HOSPITAL_COMMUNITY): Payer: Medicare Other

## 2013-07-02 ENCOUNTER — Encounter (HOSPITAL_COMMUNITY): Payer: Medicare Other

## 2013-08-02 ENCOUNTER — Encounter (HOSPITAL_COMMUNITY)
Admission: RE | Admit: 2013-08-02 | Discharge: 2013-08-02 | Disposition: A | Discharge status: Home / Self Care | Payer: Self-pay | Source: Ambulatory Visit | Attending: Cardiovascular Disease | Admitting: Cardiovascular Disease

## 2013-08-02 DIAGNOSIS — Z5189 Encounter for other specified aftercare: Secondary | ICD-10-CM | POA: Insufficient documentation

## 2013-08-02 DIAGNOSIS — I2510 Atherosclerotic heart disease of native coronary artery without angina pectoris: Secondary | ICD-10-CM | POA: Insufficient documentation

## 2013-08-02 DIAGNOSIS — Z951 Presence of aortocoronary bypass graft: Secondary | ICD-10-CM | POA: Insufficient documentation

## 2013-08-02 DIAGNOSIS — I7389 Other specified peripheral vascular diseases: Secondary | ICD-10-CM | POA: Insufficient documentation

## 2013-08-03 ENCOUNTER — Telehealth: Payer: Self-pay | Admitting: Cardiovascular Disease

## 2013-08-03 NOTE — Telephone Encounter (Signed)
Derek Wells is stating that his chest feels tight and sore when he takes a deep breath .Marland Kitchen Some shortness of breath today while playing golf  Walking up some hills. Would like to know if he will need to come in or take some for this  Please Call

## 2013-08-03 NOTE — Telephone Encounter (Signed)
Returned call and pt verified x 2.  Pt stated when he woke up this morning his whole chest felt sore.  Stated it felt strange.  Stated he did play golf today.  C/o chest tightness and SOB.  Pt c/o worsening chest pressure w/ deep breaths.  Wanted to know what to do, if he should come in or what.  Pt advised he should be seen in ER as pt unsure where NTG is (stated it's in the house somewhere) and he is still having chest pressure since this AM.  Pt declined and asked to come in office.  Informed provider will be notified as RN does not want to schedule appt and delay treatment if not necessary and he ends up at ER for further evaluation.  Pt verbalized understanding.  Derek Finlay, PA-C notified and advised pt should be seen in ER r/t symptoms described.  Pt informed and still declined ER visit.  Derek Finlay, PA-C notified and spoke w/ pt.  Advised pt come in tomorrow morning between 8am-9am.  Call to pt and appt scheduled for 8:40am w/ Derek Shelter, PA-C for evaluation.

## 2013-08-03 NOTE — Telephone Encounter (Signed)
Please call him at this number --[phone]

## 2013-08-04 ENCOUNTER — Ambulatory Visit (INDEPENDENT_AMBULATORY_CARE_PROVIDER_SITE_OTHER): Payer: Medicare Other | Admitting: Cardiology

## 2013-08-04 ENCOUNTER — Encounter (HOSPITAL_COMMUNITY)
Admission: RE | Admit: 2013-08-04 | Discharge: 2013-08-04 | Disposition: A | Discharge status: Home / Self Care | Payer: Self-pay | Source: Ambulatory Visit | Attending: Cardiovascular Disease | Admitting: Cardiovascular Disease

## 2013-08-04 ENCOUNTER — Encounter: Payer: Self-pay | Admitting: Cardiology

## 2013-08-04 VITALS — BP 140/72 | HR 77 | Ht 73.0 in | Wt 244.8 lb

## 2013-08-04 DIAGNOSIS — I10 Essential (primary) hypertension: Secondary | ICD-10-CM

## 2013-08-04 DIAGNOSIS — Z951 Presence of aortocoronary bypass graft: Secondary | ICD-10-CM

## 2013-08-04 DIAGNOSIS — R0609 Other forms of dyspnea: Secondary | ICD-10-CM | POA: Insufficient documentation

## 2013-08-04 DIAGNOSIS — R0600 Dyspnea, unspecified: Secondary | ICD-10-CM | POA: Insufficient documentation

## 2013-08-04 DIAGNOSIS — R0989 Other specified symptoms and signs involving the circulatory and respiratory systems: Secondary | ICD-10-CM

## 2013-08-04 DIAGNOSIS — E785 Hyperlipidemia, unspecified: Secondary | ICD-10-CM

## 2013-08-04 MED ORDER — ATORVASTATIN CALCIUM 80 MG PO TABS: 80.0000 mg | ORAL_TABLET | Freq: Every day | ORAL | Status: DC

## 2013-08-04 NOTE — Assessment & Plan Note (Signed)
We discussed lipid therapy

## 2013-08-04 NOTE — Patient Instructions (Signed)
Increase Lipitor to 80 mg daily. Follow up with Dr Tresa Endo in one month. Call if your symptoms change.

## 2013-08-04 NOTE — Assessment & Plan Note (Signed)
No angina, very mild symptoms, no CHF

## 2013-08-04 NOTE — Assessment & Plan Note (Signed)
No anginal symptoms (Lt arm pain golfing pre CABG)

## 2013-08-04 NOTE — Progress Notes (Signed)
08/04/2013 Derek Wells   09/11/1933  [id number]  Primary Physicia Pearla Dubonnet, MD Primary Cardiologist: Dr Tresa Endo  HPI:  79 y/o male S/P CABG X 24 December 2012. He is here today after he noted some vague Lt sided discomfort. This came on yesterday. His symptoms are worse with movement. He has not exerted himself yet but plans to go to cardiac rehab this afternoon and golf later today. His wife wanted him "checked out". His EKG shows no acute changes.   Current Outpatient Prescriptions  Medication Sig Dispense Refill  . aspirin 81 MG tablet Take 81 mg by mouth daily.      . fish oil-omega-3 fatty acids 1000 MG capsule Take 1 g by mouth daily.      . metoprolol tartrate (LOPRESSOR) 25 MG tablet Take 0.5 tablets by mouth daily.      . Multiple Vitamin (MULTIVITAMIN WITH MINERALS) TABS Take 1 tablet by mouth daily.      . valsartan-hydrochlorothiazide (DIOVAN-HCT) 160-12.5 MG per tablet Take 1 tablet by mouth daily.  90 tablet  2  . atorvastatin (LIPITOR) 80 MG tablet Take 1 tablet (80 mg total) by mouth daily.  90 tablet  3  . metoprolol succinate (TOPROL XL) 25 MG 24 hr tablet Take 1/2 tablet daily  15 tablet  6   No current facility-administered medications for this visit.    Allergies  Allergen Reactions  . Penicillins Rash    History   Social History  . Marital Status: Widowed    Spouse Name: N/A    Number of Children: N/A  . Years of Education: N/A   Occupational History  . Not on file.   Social History Main Topics  . Smoking status: Former Smoker    Types: Cigars    Quit date: 01/16/2013  . Smokeless tobacco: Not on file  . Alcohol Use: 7.0 oz/week    14 drink(s) per week  . Drug Use: Not on file  . Sexual Activity: Not on file   Other Topics Concern  . Not on file   Social History Narrative  . No narrative on file     Review of Systems: General: negative for chills, fever, night sweats or weight changes.  Cardiovascular: negative for chest pain,  dyspnea on exertion, edema, orthopnea, palpitations, paroxysmal nocturnal dyspnea or shortness of breath Dermatological: negative for rash Respiratory: negative for cough or wheezing Urologic: negative for hematuria Abdominal: negative for nausea, vomiting, diarrhea, bright red blood per rectum, melena, or hematemesis Neurologic: negative for visual changes, syncope, or dizziness All other systems reviewed and are otherwise negative except as noted above.    Blood pressure 140/72, pulse 77, height 6\' 1"  (1.854 m), weight 244 lb 12.8 oz (111.041 kg).  General appearance: alert, cooperative, no distress and moderately obese Neck: no carotid bruit and no JVD Lungs: clear to auscultation bilaterally Heart: regular rate and rhythm Extremities: no edema  EKG NSR- IVCD  ASSESSMENT AND PLAN:   Dyspnea on exertion No angina, very mild symptoms, no CHF  S/P CABG x 3, LIMA-LAD, VG-OM, VG-PDA 01/15/13  No anginal symptoms (Lt arm pain golfing pre CABG)  Hyperlipidemia LDL goal < 70 We discussed lipid therapy  HTN (hypertension) Controlled   PLAN  Reassurance. I encouraged him to ontinue with his planned activities and call if his symptoms change. See Dr Tresa Endo in 4 weeks.  [facility] KPA-C 08/04/2013 4:42 PM

## 2013-08-04 NOTE — Assessment & Plan Note (Signed)
Controlled.  

## 2013-08-06 ENCOUNTER — Encounter (HOSPITAL_COMMUNITY): Payer: Self-pay

## 2013-08-09 ENCOUNTER — Encounter (HOSPITAL_COMMUNITY): Payer: Self-pay

## 2013-08-11 ENCOUNTER — Encounter (HOSPITAL_COMMUNITY): Payer: Self-pay

## 2013-08-13 ENCOUNTER — Encounter (HOSPITAL_COMMUNITY)
Admission: RE | Admit: 2013-08-13 | Discharge: 2013-08-13 | Disposition: A | Discharge status: Home / Self Care | Payer: Self-pay | Source: Ambulatory Visit | Attending: Cardiovascular Disease | Admitting: Cardiovascular Disease

## 2013-08-16 ENCOUNTER — Encounter (HOSPITAL_COMMUNITY)
Admission: RE | Admit: 2013-08-16 | Discharge: 2013-08-16 | Disposition: A | Discharge status: Home / Self Care | Payer: Self-pay | Source: Ambulatory Visit | Attending: Cardiovascular Disease | Admitting: Cardiovascular Disease

## 2013-08-18 ENCOUNTER — Encounter (HOSPITAL_COMMUNITY): Admission: RE | Admit: 2013-08-18 | Payer: Self-pay | Source: Ambulatory Visit

## 2013-08-23 ENCOUNTER — Encounter (HOSPITAL_COMMUNITY): Payer: Medicare Other

## 2013-08-23 DIAGNOSIS — Z5189 Encounter for other specified aftercare: Secondary | ICD-10-CM | POA: Insufficient documentation

## 2013-08-23 DIAGNOSIS — Z951 Presence of aortocoronary bypass graft: Secondary | ICD-10-CM | POA: Insufficient documentation

## 2013-08-23 DIAGNOSIS — I7389 Other specified peripheral vascular diseases: Secondary | ICD-10-CM | POA: Insufficient documentation

## 2013-08-23 DIAGNOSIS — I2510 Atherosclerotic heart disease of native coronary artery without angina pectoris: Secondary | ICD-10-CM | POA: Insufficient documentation

## 2013-08-25 ENCOUNTER — Encounter (HOSPITAL_COMMUNITY): Payer: Self-pay

## 2013-08-27 ENCOUNTER — Encounter (HOSPITAL_COMMUNITY): Payer: Self-pay

## 2013-08-30 ENCOUNTER — Encounter (HOSPITAL_COMMUNITY): Payer: Self-pay

## 2013-09-01 ENCOUNTER — Encounter (HOSPITAL_COMMUNITY): Payer: Self-pay

## 2013-09-03 ENCOUNTER — Encounter (HOSPITAL_COMMUNITY): Payer: Self-pay

## 2013-09-06 ENCOUNTER — Encounter (HOSPITAL_COMMUNITY): Payer: Self-pay

## 2013-09-08 ENCOUNTER — Encounter (HOSPITAL_COMMUNITY): Payer: Self-pay

## 2013-09-10 ENCOUNTER — Encounter (HOSPITAL_COMMUNITY): Payer: Self-pay

## 2013-09-13 ENCOUNTER — Encounter (HOSPITAL_COMMUNITY): Payer: Self-pay

## 2013-09-15 ENCOUNTER — Encounter (HOSPITAL_COMMUNITY): Payer: Self-pay

## 2013-09-20 ENCOUNTER — Encounter (HOSPITAL_COMMUNITY)
Admission: RE | Admit: 2013-09-20 | Discharge: 2013-09-20 | Disposition: A | Discharge status: Home / Self Care | Payer: Self-pay | Source: Ambulatory Visit | Attending: Cardiovascular Disease | Admitting: Cardiovascular Disease

## 2013-09-22 ENCOUNTER — Encounter (HOSPITAL_COMMUNITY)
Admission: RE | Admit: 2013-09-22 | Discharge: 2013-09-22 | Disposition: A | Discharge status: Home / Self Care | Payer: Self-pay | Source: Ambulatory Visit | Attending: Cardiovascular Disease | Admitting: Cardiovascular Disease

## 2013-09-24 ENCOUNTER — Encounter (HOSPITAL_COMMUNITY)
Admission: RE | Admit: 2013-09-24 | Discharge: 2013-09-24 | Disposition: A | Discharge status: Home / Self Care | Payer: Self-pay | Source: Ambulatory Visit | Attending: Cardiovascular Disease | Admitting: Cardiovascular Disease

## 2013-09-24 DIAGNOSIS — Z951 Presence of aortocoronary bypass graft: Secondary | ICD-10-CM | POA: Insufficient documentation

## 2013-09-24 DIAGNOSIS — I7389 Other specified peripheral vascular diseases: Secondary | ICD-10-CM | POA: Insufficient documentation

## 2013-09-24 DIAGNOSIS — Z5189 Encounter for other specified aftercare: Secondary | ICD-10-CM | POA: Insufficient documentation

## 2013-09-24 DIAGNOSIS — I2510 Atherosclerotic heart disease of native coronary artery without angina pectoris: Secondary | ICD-10-CM | POA: Insufficient documentation

## 2013-09-27 ENCOUNTER — Ambulatory Visit (INDEPENDENT_AMBULATORY_CARE_PROVIDER_SITE_OTHER): Payer: Medicare Other | Admitting: Cardiology

## 2013-09-27 ENCOUNTER — Other Ambulatory Visit (HOSPITAL_COMMUNITY): Payer: Self-pay | Admitting: *Deleted

## 2013-09-27 ENCOUNTER — Encounter (HOSPITAL_COMMUNITY)
Admission: RE | Admit: 2013-09-27 | Discharge: 2013-09-27 | Disposition: A | Discharge status: Home / Self Care | Payer: Self-pay | Source: Ambulatory Visit | Attending: Cardiovascular Disease | Admitting: Cardiovascular Disease

## 2013-09-27 ENCOUNTER — Encounter: Payer: Self-pay | Admitting: Cardiology

## 2013-09-27 ENCOUNTER — Ambulatory Visit (HOSPITAL_COMMUNITY): Payer: Medicare Other

## 2013-09-27 ENCOUNTER — Ambulatory Visit (HOSPITAL_COMMUNITY)
Admission: RE | Admit: 2013-09-27 | Discharge: 2013-09-27 | Disposition: A | Discharge status: Home / Self Care | Payer: Medicare Other | Source: Ambulatory Visit | Attending: Internal Medicine | Admitting: Internal Medicine

## 2013-09-27 VITALS — BP 130/70 | HR 77 | Ht 73.0 in | Wt 240.0 lb

## 2013-09-27 DIAGNOSIS — I10 Essential (primary) hypertension: Secondary | ICD-10-CM

## 2013-09-27 DIAGNOSIS — R9431 Abnormal electrocardiogram [ECG] [EKG]: Secondary | ICD-10-CM | POA: Insufficient documentation

## 2013-09-27 DIAGNOSIS — Z79899 Other long term (current) drug therapy: Secondary | ICD-10-CM

## 2013-09-27 DIAGNOSIS — I4892 Unspecified atrial flutter: Secondary | ICD-10-CM | POA: Insufficient documentation

## 2013-09-27 DIAGNOSIS — E785 Hyperlipidemia, unspecified: Secondary | ICD-10-CM

## 2013-09-27 DIAGNOSIS — Z951 Presence of aortocoronary bypass graft: Secondary | ICD-10-CM

## 2013-09-27 MED ORDER — RIVAROXABAN 20 MG PO TABS: 20.0000 mg | ORAL_TABLET | Freq: Every day | ORAL | Status: DC

## 2013-09-27 NOTE — Patient Instructions (Addendum)
Your physician has recommended that you have a Cardioversion (DCCV). Electrical Cardioversion uses a jolt of electricity to your heart either through paddles or wired patches attached to your chest. This is a controlled, usually prescheduled, procedure. Defibrillation is done under light anesthesia in the hospital, and you usually go home the day of the procedure. This is done to get your heart back into a normal rhythm. You are not awake for the procedure. Please see the instruction sheet given to you today. Start Xarelto 20 mg daily, continue your other medications. Your physician recommends that you return for lab work in: 2 weeks

## 2013-09-27 NOTE — Progress Notes (Signed)
Mr. Derek Wells on exit from cardiac rehab we noticed his heart rate was 115, with irregular pulse, we placed him on the zoll and saw atrial flutter rate 104-115, patient asymptomatic, blood pressure 140/80.  A 12 lead ekg was done and again showed atrial flutter, rate 76, again patient asymptomatic, blood pressure 163/94.  12 lead faxed to Dr. Landry DykeKelly's office, called for an appointment which is at 1:40pm today with Corine ShelterLuke Kilroy, PA.  We called and talked to [place], [place] and she will make Dr. Tresa EndoKelly aware of this new onset and that it is ok for the patient to go to the office instead of the emergency department.  Patient given appointment and will honor the appointment, he remains asymptomatic.

## 2013-09-27 NOTE — Progress Notes (Signed)
09/27/2013 Derek Wells   1933-04-19  [id number]  Primary Physicia Derek DubonnetGATES,ROBERT NEVILL, MD Primary Cardiologist: Dr Derek Wells  HPI:  80 y/o followed by Dr Derek Wells with a history of CAD and PVD. He had an aortic aneurysm repair in April 2010.  In April 2014 he ended up having CABG X 3 with a LIMA-LAD, SVG-OM, SVG-PDA after a cath revealed severe CAD. This was after a negative Myoview Sept 2013. His post CABG course was unremarkable. He had no arrythmia issues. Today he is seen as an add on, sent from Cardiac Rehab. At cardiac rehab today he noted his pulse was higher than usual when it was checked- up to 114. An EKG confirmed atrial flutter. He is asymptomatic and unaware of an irregular heart rate. He tells me he finished a steroid dose pack last week after a URI over the holidays. He has not used OTC decongestants.    Current Outpatient Prescriptions  Medication Sig Dispense Refill  . aspirin 81 MG tablet Take 81 mg by mouth daily.      Marland Kitchen. atorvastatin (LIPITOR) 80 MG tablet Take 1 tablet (80 mg total) by mouth daily.  90 tablet  3  . fish oil-omega-3 fatty acids 1000 MG capsule Take 1 g by mouth daily.      . metFORMIN (GLUCOPHAGE) 500 MG tablet Take 500 mg by mouth daily.      . metoprolol succinate (TOPROL XL) 25 MG 24 hr tablet Take 1/2 tablet daily  15 tablet  6  . Multiple Vitamin (MULTIVITAMIN WITH MINERALS) TABS Take 1 tablet by mouth daily.      . valsartan-hydrochlorothiazide (DIOVAN-HCT) 160-12.5 MG per tablet Take 1 tablet by mouth daily.  90 tablet  2  . Rivaroxaban (XARELTO) 20 MG TABS tablet Take 1 tablet (20 mg total) by mouth daily with supper.  30 tablet  11   No current facility-administered medications for this visit.    Allergies  Allergen Reactions  . Penicillins Rash    History   Social History  . Marital Status: Widowed    Spouse Name: N/A    Number of Children: N/A  . Years of Education: N/A   Occupational History  . Not on file.   Social History Main Topics   . Smoking status: Former Smoker    Types: Cigars    Quit date: 01/16/2013  . Smokeless tobacco: Not on file  . Alcohol Use: 7.0 oz/week    14 drink(s) per week  . Drug Use: Not on file  . Sexual Activity: Not on file   Other Topics Concern  . Not on file   Social History Narrative  . No narrative on file     Review of Systems: General: negative for chills, fever, night sweats or weight changes.  Cardiovascular: negative for chest pain, dyspnea on exertion, edema, orthopnea, palpitations, paroxysmal nocturnal dyspnea or shortness of breath Dermatological: negative for rash Respiratory: negative for cough or wheezing Urologic: negative for hematuria Abdominal: negative for nausea, vomiting, diarrhea, bright red blood per rectum, melena, or hematemesis Neurologic: negative for visual changes, syncope, or dizziness All other systems reviewed and are otherwise negative except as noted above.    Blood pressure 130/70, pulse 77, height 6\' 1"  (1.854 m), weight 240 lb (108.863 kg).  General appearance: alert, cooperative, no distress and moderately obese Neck: no carotid bruit and no JVD Lungs: clear to auscultation bilaterally Heart: irregularly irregular rhythm Abdomen: obese, non tender Extremities: no edema  EKG A flutter with VR  of c72. Baseline IVCD.  ASSESSMENT AND PLAN:   Atrial flutter Sent from Cardiac rehab with an irregular heart rhythm.   S/P CABG x 3, LIMA-LAD, VG-OM, VG-PDA 01/15/13  .  HTN (hypertension) .  Hyperlipidemia LDL goal < 70 Tolerating Lipitor   PLAN  I discussed Derek Wells's case with Dr Derek Wells. We have added Xarelto 20 mg. In two weeks he'll have a TSH, CMET,CBC, Mg++. He'll be set up for an OP DCCV in 4 weeks.   Derek Wells KPA-C 09/27/2013 2:43 PM

## 2013-09-27 NOTE — Assessment & Plan Note (Signed)
Sent from Cardiac rehab with an irregular heart rhythm.

## 2013-09-27 NOTE — Assessment & Plan Note (Signed)
Tolerating Lipitor 

## 2013-09-29 ENCOUNTER — Encounter (HOSPITAL_COMMUNITY): Payer: Self-pay

## 2013-10-01 ENCOUNTER — Encounter (HOSPITAL_COMMUNITY): Payer: Self-pay

## 2013-10-04 ENCOUNTER — Encounter (HOSPITAL_COMMUNITY): Payer: Self-pay

## 2013-10-06 ENCOUNTER — Encounter (HOSPITAL_COMMUNITY): Payer: Self-pay

## 2013-10-08 ENCOUNTER — Encounter (HOSPITAL_COMMUNITY): Payer: Self-pay

## 2013-10-11 ENCOUNTER — Encounter (HOSPITAL_COMMUNITY): Payer: Self-pay

## 2013-10-12 LAB — COMPREHENSIVE METABOLIC PANEL
ALT: 16 U/L (ref 0–53)
AST: 14 U/L (ref 0–37)
Albumin: 4 g/dL (ref 3.5–5.2)
Alkaline Phosphatase: 60 U/L (ref 39–117)
BUN: 18 mg/dL (ref 6–23)
CO2: 29 mEq/L (ref 19–32)
Calcium: 9.1 mg/dL (ref 8.4–10.5)
Chloride: 102 mEq/L (ref 96–112)
Creat: 1.02 mg/dL (ref 0.50–1.35)
Glucose, Bld: 100 mg/dL — ABNORMAL HIGH (ref 70–99)
Potassium: 4.4 mEq/L (ref 3.5–5.3)
Sodium: 140 mEq/L (ref 135–145)
Total Bilirubin: 0.7 mg/dL (ref 0.3–1.2)
Total Protein: 6.4 g/dL (ref 6.0–8.3)

## 2013-10-12 LAB — CBC
HCT: 41.7 % (ref 39.0–52.0)
Hemoglobin: 13.7 g/dL (ref 13.0–17.0)
MCH: 28.5 pg (ref 26.0–34.0)
MCHC: 32.9 g/dL (ref 30.0–36.0)
MCV: 86.9 fL (ref 78.0–100.0)
Platelets: 314 10*3/uL (ref 150–400)
RBC: 4.8 MIL/uL (ref 4.22–5.81)
RDW: 16.2 % — ABNORMAL HIGH (ref 11.5–15.5)
WBC: 8.7 10*3/uL (ref 4.0–10.5)

## 2013-10-12 LAB — MAGNESIUM: Magnesium: 2 mg/dL (ref 1.5–2.5)

## 2013-10-13 ENCOUNTER — Encounter (HOSPITAL_COMMUNITY): Payer: Self-pay

## 2013-10-13 LAB — TSH: TSH: 2.683 u[IU]/mL (ref 0.350–4.500)

## 2013-10-14 ENCOUNTER — Telehealth: Payer: Self-pay | Admitting: *Deleted

## 2013-10-14 NOTE — Telephone Encounter (Signed)
Returned cardiac rehab prescription to [facility] @[phone].

## 2013-10-15 ENCOUNTER — Encounter (HOSPITAL_COMMUNITY): Payer: Self-pay

## 2013-10-18 ENCOUNTER — Encounter (HOSPITAL_COMMUNITY): Payer: Self-pay

## 2013-10-20 ENCOUNTER — Encounter (HOSPITAL_COMMUNITY): Payer: Self-pay

## 2013-10-22 ENCOUNTER — Encounter (HOSPITAL_COMMUNITY): Admission: RE | Admit: 2013-10-22 | Payer: Self-pay | Source: Ambulatory Visit

## 2013-10-22 ENCOUNTER — Telehealth (HOSPITAL_COMMUNITY): Payer: Self-pay | Admitting: *Deleted

## 2013-10-25 ENCOUNTER — Encounter (HOSPITAL_COMMUNITY): Payer: Medicare Other | Attending: Cardiovascular Disease

## 2013-10-25 DIAGNOSIS — I7389 Other specified peripheral vascular diseases: Secondary | ICD-10-CM | POA: Insufficient documentation

## 2013-10-25 DIAGNOSIS — Z5189 Encounter for other specified aftercare: Secondary | ICD-10-CM | POA: Insufficient documentation

## 2013-10-25 DIAGNOSIS — Z951 Presence of aortocoronary bypass graft: Secondary | ICD-10-CM | POA: Insufficient documentation

## 2013-10-25 DIAGNOSIS — I2510 Atherosclerotic heart disease of native coronary artery without angina pectoris: Secondary | ICD-10-CM | POA: Insufficient documentation

## 2013-10-27 ENCOUNTER — Ambulatory Visit (INDEPENDENT_AMBULATORY_CARE_PROVIDER_SITE_OTHER): Payer: Medicare Other | Admitting: Cardiovascular Disease

## 2013-10-27 ENCOUNTER — Encounter: Payer: Self-pay | Admitting: Cardiovascular Disease

## 2013-10-27 ENCOUNTER — Encounter (HOSPITAL_COMMUNITY): Payer: Self-pay

## 2013-10-27 VITALS — BP 140/76 | HR 70 | Ht 73.0 in | Wt 246.6 lb

## 2013-10-27 DIAGNOSIS — Z951 Presence of aortocoronary bypass graft: Secondary | ICD-10-CM

## 2013-10-27 DIAGNOSIS — Z9889 Other specified postprocedural states: Secondary | ICD-10-CM

## 2013-10-27 DIAGNOSIS — E785 Hyperlipidemia, unspecified: Secondary | ICD-10-CM

## 2013-10-27 DIAGNOSIS — I4891 Unspecified atrial fibrillation: Secondary | ICD-10-CM

## 2013-10-27 DIAGNOSIS — I2510 Atherosclerotic heart disease of native coronary artery without angina pectoris: Secondary | ICD-10-CM

## 2013-10-27 DIAGNOSIS — Z01811 Encounter for preprocedural respiratory examination: Secondary | ICD-10-CM

## 2013-10-27 DIAGNOSIS — Z01810 Encounter for preprocedural cardiovascular examination: Secondary | ICD-10-CM

## 2013-10-27 DIAGNOSIS — I4892 Unspecified atrial flutter: Secondary | ICD-10-CM

## 2013-10-27 DIAGNOSIS — Z8679 Personal history of other diseases of the circulatory system: Secondary | ICD-10-CM

## 2013-10-27 DIAGNOSIS — D689 Coagulation defect, unspecified: Secondary | ICD-10-CM

## 2013-10-27 DIAGNOSIS — I10 Essential (primary) hypertension: Secondary | ICD-10-CM

## 2013-10-27 NOTE — Patient Instructions (Addendum)
Your physician has recommended you make the following change in your medication: increase the metoprolol succ to 25 mg daily.  Your physician recommends that you return for lab work within 7 days of cardioversion.  Come to office on February the 16 th to have EKG.  Your physician has recommended that you have a Cardioversion (DCCV). Electrical Cardioversion uses a jolt of electricity to your heart either through paddles or wired patches attached to your chest. This is a controlled, usually prescheduled, procedure. Defibrillation is done under light anesthesia in the hospital, and you usually go home the day of the procedure. This is done to get your heart back into a normal rhythm. You are not awake for the procedure. Please see the instruction sheet given to you today. This will be scheduled on February the 17th.  Your physician recommends that you schedule a follow-up appointment in: Following the procedure. They will give you appointment at time of discharge. A chest x-ray takes a picture of the organs and structures inside the chest, including the heart, lungs, and blood vessels. This test can show several things, including, whether the heart is enlarges; whether fluid is building up in the lungs; and whether pacemaker / defibrillator leads are still in place.

## 2013-10-29 ENCOUNTER — Encounter (HOSPITAL_COMMUNITY): Payer: Self-pay

## 2013-10-31 ENCOUNTER — Encounter: Payer: Self-pay | Admitting: Cardiovascular Disease

## 2013-10-31 DIAGNOSIS — Z8679 Personal history of other diseases of the circulatory system: Secondary | ICD-10-CM | POA: Insufficient documentation

## 2013-10-31 DIAGNOSIS — Z9889 Other specified postprocedural states: Secondary | ICD-10-CM

## 2013-10-31 NOTE — Progress Notes (Signed)
Patient ID: Derek Wells, male   DOB: 03/29/1933, 80 y.o.   MRN: [id number]     HPI: Derek Wells, is a 80 y.o. male who resents to the office today for followup cardiology evaluation. I had last seen him in September 2014. However, [facility] and was found to be in atrial flutter at that time. He presents now for evaluation.  Derek Wells has a history of hypertension, hyperlipidemia, and who had undergone prior abdominal aortic aneurysm repair in April 2010. I had seen him on 01/14/2013 at which time he had experienced symptoms worrisome for unstable angina. In September 2013 he previously had undergone a nuclear perfusion study  which was essentially normal although did show mild diaphragmatic attenuation. He also been found to have a atherogenic dyslipidemia pattern with reference to his lipid panel. Catheterization the following day on 01/15/2013 revealed life-threatening coronary anatomy with 60-70% ostial left main stenosis, 90-95% distal left main stenosis, 95-99% ostial LAD stenosis, and mild/moderate stenoses in the RCA. That same day he was successfully operated by Derek Wells and underwent CABG x3 with a LIMA to the LAD, SVG to the OM, and SVG to the PDA. He had a unremarkable postoperative course and was discharged 4 days later.  He saw Derek Wells on 01/27/13 and was doing well. I saw him in followup on 03/02/2013 and he  participating  in cardiac rehabilitation. When I last saw him in September, he was in sinus rhythm and has and had resumed a good level of activity.   On January 5 he was at cardiac rehabilitation and the nurses noted that his pulse was higher than usual and stayed high at approximately 114 beats per minute despite rest. An EKG was done which showed atrial flutter. He was unaware of his heart rhythm being abnormal. He had developed a URI over the holidays prior to that and had just completed a steroid dose pack. Derek Wells was added to  his medical regimen. His blood pressure was stable and when he is seeing blue his pulse was in the 70s. He did undergo subsequent blood work on January 20 which showed normal renal function and normal electrolytes. Liver function studies were normal. Hemoglobin was 13.7 hematocrit 41.7. Platelets were normal. TSH was normal at 2.63. Magnesium was normal at 2.0. He presents now for followup evaluation.   Presently he is asymptomatic with reference to his atrial flutter. He states he did play golf several times this week. He denies bleeding. He presents for evaluation.  Past Medical History  Diagnosis Date  . Hypertension   . Coronary artery disease   . CAD (coronary artery disease), severe needing CABG 01/19/2013  . S/P CABG x 3 01/19/2013  . HTN (hypertension) 01/19/2013  . Hyperlipidemia LDL goal < 70 01/19/2013  . Chest pain     2D ECHO, 06/16/2012 - EF 50-55%, borderline low left ventricular systolic function  . Dyspnea on exertion     LEXISCAN, 06/17/2012 - fixed inferior bowel artifact and basal septal defect, probably related to IVCD/incomplete LBBB, no reversible ischemia, post-stress EF 66%  . History of AAA (abdominal aortic aneurysm) repair     ABDOMINAL AORTIC DOPPLER, 06/16/2013 - normal    Past Surgical History  Procedure Laterality Date  . Vascular surgery    . Coronary artery bypass graft N/A 01/15/2013    Procedure: Coronary Artery Bypass Graft times three using Right Greater Saphenous Vein Graft harvested endoscopically and Left  Internal Mammary Artery and;  Surgeon: Derek Borne, MD;  Location: [facility] OR;  Service: Open Heart Surgery;  Laterality: N/A;  . Intraoperative transesophageal echocardiogram  01/15/2013    Procedure: INTRAOPERATIVE TRANSESOPHAGEAL ECHOCARDIOGRAM;  Surgeon: Derek Borne, MD;  Location: [facility] OR;  Service: Open Heart Surgery;;  . Cardiac catheterization  01/15/2013    Severe coronary obstructive disease with 60-70% ostial left main and 90-85% distal LAD  stenosis and mild-moderate stenoses in RCA, recommended for CABG    Allergies  Allergen Reactions  . Penicillins Rash    Current Outpatient Prescriptions  Medication Sig Dispense Refill  . aspirin 81 Wells tablet Take 81 Wells by mouth daily.      Marland Kitchen atorvastatin (LIPITOR) 80 Wells tablet Take 1 tablet (80 Wells total) by mouth daily.  90 tablet  3  . fish oil-omega-3 fatty acids 1000 Wells capsule Take 1 g by mouth daily.      . metFORMIN (GLUCOPHAGE) 500 Wells tablet Take 500 Wells by mouth daily.      . metoprolol succinate (Derek Wells) 25 Wells 24 hr tablet 25 Wells.      . Multiple Vitamin (MULTIVITAMIN WITH MINERALS) TABS Take 1 tablet by mouth daily.      . Rivaroxaban (XARELTO) 20 Wells TABS tablet Take 1 tablet (20 Wells total) by mouth daily with supper.  30 tablet  11  . valsartan-hydrochlorothiazide (Derek Wells) 160-12.5 Wells per tablet Take 1 tablet by mouth daily.  90 tablet  2   No current facility-administered medications for this visit.    Socially he is re-married. His first wife of 42 years passed away. He previously had smoked cigars prior to his bypass surgery but has not had any tobacco use since.  ROS is negative for fevers, chills or night sweats. He denies visual symptoms. He denies palpitations. He denies wheezing. He denies recent cough following his URI symptoms which have resolved. He denies any of the discomfort that he experienced prior to his bypass surgery. There is no chest pain. He denies changes in bowel or bladder habits. There is no bleeding. He denies myalgias. His previous flushing related to Derek Wells solved after he discontinued this therapy. He denies GI symptoms. He denies abdominal pain. He denies significant edema.   Residual claudication. He is unaware of significant sleep disordered breathing. Other comprehensive 14 point system review is negative.  PE BP 140/76  Pulse 70  Ht 6\' 1"  (1.854 m)  Wt 246 lb 9.6 oz (111.857 kg)  BMI 32.54 kg/m2  General: Alert, oriented, no  distress.  Skin: normal turgor, no rashes HEENT: Normocephalic, atraumatic. Pupils round and reactive; sclera anicteric;no lid lag.  Nose without nasal septal hypertrophy Mouth/Parynx benign; Mallinpatti scale 2 Neck: No JVD, no carotid bruits with normal carotid upstrokes Lungs: clear to ausculatation and percussion; no wheezing or rales Chest wall: No tenderness to palpation Heart: Mildly irregular with a controlled ventricular rate in the 70s, s1 s2 normal  1-2/6 systolic murmur ; no S3. No rub. Abdomen: soft, nontender; no hepatosplenomehaly, BS+; abdominal aorta nontender and not dilated by palpation. Back: No CVA tenderness Pulses 2+ Extremities: no clubbing cyanosis or edema, Homan's sign negative  Neurologic: grossly nonfocal; cranial nerves grossly intact Psychological: Normal affect and mood  ECG (independently read by me): Atrial flutter with variable rate but predominantly 4-1 with occasional 3-1 block; average heart rate 70 beats per minute  Prior ECG from 06/04/2013: Sinus rhythm with first-degree AV block. Nonspecific interventricular block  LABS:  BMET  Component Value Date/Time   NA 140 10/12/2013 0854   K 4.4 10/12/2013 0854   CL 102 10/12/2013 0854   CO2 29 10/12/2013 0854   GLUCOSE 100* 10/12/2013 0854   BUN 18 10/12/2013 0854   CREATININE 1.02 10/12/2013 0854   CREATININE 0.99 01/17/2013 0420   CALCIUM 9.1 10/12/2013 0854   GFRNONAA 76* 01/17/2013 0420   GFRAA 89* 01/17/2013 0420     Hepatic Function Panel     Component Value Date/Time   PROT 6.4 10/12/2013 0854   ALBUMIN 4.0 10/12/2013 0854   AST 14 10/12/2013 0854   ALT 16 10/12/2013 0854   ALKPHOS 60 10/12/2013 0854   BILITOT 0.7 10/12/2013 0854     CBC    Component Value Date/Time   WBC 8.7 10/12/2013 0854   RBC 4.80 10/12/2013 0854   HGB 13.7 10/12/2013 0854   HCT 41.7 10/12/2013 0854   PLT 314 10/12/2013 0854   MCV 86.9 10/12/2013 0854   MCH 28.5 10/12/2013 0854   MCHC 32.9 10/12/2013 0854   RDW 16.2*  10/12/2013 0854     BNP No results found for this basename: probnp    Lipid Panel     Component Value Date/Time   TRIG 134 06/14/2013 0922   LDLCALC 82 06/14/2013 0922     RADIOLOGY: No results found.    ASSESSMENT AND PLAN:  Mr. Bonfanti continues is now 10 months months status post emergency CABG revascularization surgery after cardiac catheterization demonstrated severe life threatening coronary anatomy.  He denies recurrent anginal symptoms and is in the maintenance phase of cardiac rehabilitation. Presently, he is asymptomatic with reference to his heart rhythm but he remains in atrial flutter with variable rate but mostly 4-1 block. He has been on anticoagulation with  Xarelto 20 Wells now for approximately one month. I have recommended slight additional titration of his beta blocker and have increased his Derek Wells from his current dose of just 12.5 Wells to 25 Wells daily. Did review his recent laboratory. On prior lipid panel he was found to have an atherogenic dyslipidemia pattern. He apparently could not tolerate Derek Wells in addition to his statin but seems to do well with his continued dose of atorvastatin 80 Wells daily. We discussed diet modification particularly reduction in carbohydrates and sweets. His blood pressure today is controlled. He is not having bleeding on Zaroxolyn 20 Wells. We discussed scheduling him for outpatient cardioversion. He tells me he plans to be away next week. As a result, I am tentatively scheduling this for Tuesday, 11/09/2013. On Monday, February 16, he will return to the office for ECG to verify if he is still in atrial flutter on his increased beta blocker regimen.  Discussed the cardioversion in detail with he and his wife. If he is still in atrial fibrillation on followup EKG plans will be for him to undergo cardioversion the following day.   Lennette Bihari, MD, [facility]  10/31/2013 5:08 PM

## 2013-11-01 ENCOUNTER — Encounter: Payer: Self-pay | Admitting: Cardiovascular Disease

## 2013-11-01 ENCOUNTER — Encounter (HOSPITAL_COMMUNITY): Payer: Self-pay

## 2013-11-01 ENCOUNTER — Other Ambulatory Visit: Payer: Self-pay | Admitting: *Deleted

## 2013-11-01 DIAGNOSIS — Z01818 Encounter for other preprocedural examination: Secondary | ICD-10-CM

## 2013-11-02 ENCOUNTER — Ambulatory Visit: Payer: Medicare Other | Admitting: Cardiovascular Disease

## 2013-11-03 ENCOUNTER — Encounter (HOSPITAL_COMMUNITY): Payer: Self-pay

## 2013-11-05 ENCOUNTER — Encounter (HOSPITAL_COMMUNITY): Payer: Self-pay

## 2013-11-08 ENCOUNTER — Ambulatory Visit
Admission: RE | Admit: 2013-11-08 | Discharge: 2013-11-08 | Disposition: A | Discharge status: Home / Self Care | Payer: Medicare Other | Source: Ambulatory Visit | Attending: Cardiovascular Disease | Admitting: Cardiovascular Disease

## 2013-11-08 ENCOUNTER — Encounter (HOSPITAL_COMMUNITY): Payer: Self-pay

## 2013-11-08 ENCOUNTER — Encounter (HOSPITAL_COMMUNITY): Payer: Self-pay | Admitting: Pharmacy Technician

## 2013-11-08 ENCOUNTER — Ambulatory Visit (INDEPENDENT_AMBULATORY_CARE_PROVIDER_SITE_OTHER): Payer: Medicare Other | Admitting: *Deleted

## 2013-11-08 DIAGNOSIS — I4891 Unspecified atrial fibrillation: Secondary | ICD-10-CM

## 2013-11-08 DIAGNOSIS — Z01811 Encounter for preprocedural respiratory examination: Secondary | ICD-10-CM

## 2013-11-08 LAB — BASIC METABOLIC PANEL
BUN: 15 mg/dL (ref 6–23)
CALCIUM: 9.2 mg/dL (ref 8.4–10.5)
CO2: 25 mEq/L (ref 19–32)
CREATININE: 0.96 mg/dL (ref 0.50–1.35)
Chloride: 105 mEq/L (ref 96–112)
GLUCOSE: 108 mg/dL — AB (ref 70–99)
Potassium: 4.1 mEq/L (ref 3.5–5.3)
Sodium: 140 mEq/L (ref 135–145)

## 2013-11-08 LAB — CBC
HCT: 39.6 % (ref 39.0–52.0)
Hemoglobin: 13.3 g/dL (ref 13.0–17.0)
MCH: 28.1 pg (ref 26.0–34.0)
MCHC: 33.6 g/dL (ref 30.0–36.0)
MCV: 83.7 fL (ref 78.0–100.0)
PLATELETS: 326 10*3/uL (ref 150–400)
RBC: 4.73 MIL/uL (ref 4.22–5.81)
RDW: 16.3 % — AB (ref 11.5–15.5)
WBC: 9.2 10*3/uL (ref 4.0–10.5)

## 2013-11-08 LAB — APTT: APTT: 41 s — AB (ref 24–37)

## 2013-11-08 LAB — PROTIME-INR
INR: 1.89 — ABNORMAL HIGH (ref <1.50)
Prothrombin Time: 21.3 seconds — ABNORMAL HIGH (ref 11.6–15.2)

## 2013-11-09 ENCOUNTER — Ambulatory Visit (HOSPITAL_COMMUNITY): Admission: RE | Admit: 2013-11-09 | Payer: Medicare Other | Source: Ambulatory Visit | Admitting: Cardiovascular Disease

## 2013-11-09 ENCOUNTER — Encounter (HOSPITAL_COMMUNITY): Admission: RE | Payer: Self-pay | Source: Ambulatory Visit

## 2013-11-09 LAB — TSH: TSH: 1.997 u[IU]/mL (ref 0.350–4.500)

## 2013-11-09 SURGERY — CARDIOVERSION
Anesthesia: Monitor Anesthesia Care

## 2013-11-10 ENCOUNTER — Encounter: Payer: Self-pay | Admitting: Cardiovascular Disease

## 2013-11-10 ENCOUNTER — Encounter (HOSPITAL_COMMUNITY): Payer: Self-pay

## 2013-11-12 ENCOUNTER — Encounter (HOSPITAL_COMMUNITY): Payer: Self-pay

## 2013-11-12 ENCOUNTER — Ambulatory Visit (HOSPITAL_COMMUNITY)
Admission: RE | Admit: 2013-11-12 | Discharge: 2013-11-12 | Disposition: A | Discharge status: Home / Self Care | Payer: Medicare Other | Source: Ambulatory Visit | Attending: Cardiovascular Disease | Admitting: Cardiovascular Disease

## 2013-11-12 ENCOUNTER — Telehealth: Payer: Self-pay | Admitting: Cardiovascular Disease

## 2013-11-12 ENCOUNTER — Encounter (HOSPITAL_COMMUNITY): Payer: Medicare Other | Admitting: Anesthesiology

## 2013-11-12 ENCOUNTER — Ambulatory Visit (HOSPITAL_COMMUNITY): Payer: Medicare Other | Admitting: Anesthesiology

## 2013-11-12 ENCOUNTER — Other Ambulatory Visit: Payer: Self-pay

## 2013-11-12 ENCOUNTER — Encounter (HOSPITAL_COMMUNITY)
Admission: RE | Disposition: A | Discharge status: Home / Self Care | Payer: Self-pay | Source: Ambulatory Visit | Attending: Cardiovascular Disease

## 2013-11-12 DIAGNOSIS — Z951 Presence of aortocoronary bypass graft: Secondary | ICD-10-CM | POA: Insufficient documentation

## 2013-11-12 DIAGNOSIS — I2510 Atherosclerotic heart disease of native coronary artery without angina pectoris: Secondary | ICD-10-CM | POA: Insufficient documentation

## 2013-11-12 DIAGNOSIS — Z7982 Long term (current) use of aspirin: Secondary | ICD-10-CM | POA: Insufficient documentation

## 2013-11-12 DIAGNOSIS — I10 Essential (primary) hypertension: Secondary | ICD-10-CM | POA: Insufficient documentation

## 2013-11-12 DIAGNOSIS — I4892 Unspecified atrial flutter: Secondary | ICD-10-CM

## 2013-11-12 DIAGNOSIS — E785 Hyperlipidemia, unspecified: Secondary | ICD-10-CM | POA: Insufficient documentation

## 2013-11-12 DIAGNOSIS — Z7901 Long term (current) use of anticoagulants: Secondary | ICD-10-CM | POA: Insufficient documentation

## 2013-11-12 DIAGNOSIS — Z87891 Personal history of nicotine dependence: Secondary | ICD-10-CM | POA: Insufficient documentation

## 2013-11-12 DIAGNOSIS — Z01818 Encounter for other preprocedural examination: Secondary | ICD-10-CM

## 2013-11-12 HISTORY — PX: CARDIOVERSION: SHX1299

## 2013-11-12 SURGERY — CARDIOVERSION
Anesthesia: Monitor Anesthesia Care

## 2013-11-12 MED ORDER — LIDOCAINE HCL (CARDIAC) 20 MG/ML IV SOLN
INTRAVENOUS | Status: DC | PRN
  Administered 2013-11-12: 60 mg via INTRAVENOUS

## 2013-11-12 MED ORDER — SODIUM CHLORIDE 0.45 % IV SOLN: INTRAVENOUS | Status: DC

## 2013-11-12 MED ORDER — SODIUM CHLORIDE 0.9 % IV SOLN
INTRAVENOUS | Status: DC
  Administered 2013-11-12: 11:00:00 via INTRAVENOUS

## 2013-11-12 MED ORDER — PROPOFOL 10 MG/ML IV BOLUS
INTRAVENOUS | Status: DC | PRN
  Administered 2013-11-12: 120 mg via INTRAVENOUS

## 2013-11-12 MED ORDER — METOPROLOL SUCCINATE ER 25 MG PO TB24: 25.0000 mg | ORAL_TABLET | Freq: Every day | ORAL | Status: DC

## 2013-11-12 MED ORDER — SODIUM CHLORIDE 0.9 % IV SOLN
INTRAVENOUS | Status: DC | PRN
  Administered 2013-11-12: 11:00:00 via INTRAVENOUS

## 2013-11-12 NOTE — Discharge Instructions (Signed)
Electrical Cardioversion, Care After °Refer to this sheet in the next few weeks. These instructions provide you with information on caring for yourself after your procedure. Your health care provider may also give you more specific instructions. Your treatment has been planned according to current medical practices, but problems sometimes occur. Call your health care provider if you have any problems or questions after your procedure. °WHAT TO EXPECT AFTER THE PROCEDURE °After your procedure, it is typical to have the following sensations: °· Some redness on the skin where the shocks were delivered. If this is tender, a sunburn lotion or hydrocortisone cream may help. °· Possible return of an abnormal heart rhythm within hours or days after the procedure. °HOME CARE INSTRUCTIONS °· Only take medicine as directed by your health care provider. Be sure you understand how and when to take your medicine. °· Learn how to feel your pulse and check it often. °· Limit your activity for 48 hours after the procedure or as directed. °· Avoid or minimize caffeine and other stimulants as directed. °SEEK MEDICAL CARE IF: °· You feel like your heart is beating too fast or your pulse is not regular. °· You have any questions about your medicines. °· You have bleeding that will not stop. °SEEK IMMEDIATE MEDICAL CARE IF: °· You are dizzy or feel faint. °· It is hard to breathe or you feel short of breath. °· There is a change in discomfort in your chest. °· Your speech is slurred or you have trouble moving an arm or leg on one side of your body. °· You get a serious muscle cramp that does not go away. °· Your fingers or toes turn cold or blue. °MAKE SURE YOU:  °· Understand these instructions.   °· Will watch your condition.   °· Will get help right away if you are not doing well or get worse. °Document Released: 06/30/2013 Document Reviewed: 03/24/2013 °ExitCare® Patient Information ©2014 [employer]. °Electrical  Cardioversion °Electrical cardioversion is the delivery of a jolt of electricity to change the rhythm of the heart. Sticky patches or metal paddles are placed on the chest to deliver the electricity from a device. This is done to restore a normal rhythm. A rhythm that is too fast or not regular keeps the heart from pumping well. °Electrical cardioversion is done in an emergency if:  °· There is low or no blood pressure as a result of the heart rhythm.   °· Normal rhythm must be restored as fast as possible to protect the brain and heart from further damage.   °· It may save a life. °Cardioversion may be done for heart rhythms that are not immediately life-threatening, such as atrial fibrillation or flutter, in which:  °· The heart is beating too fast or is not regular.   °· Medicine to change the rhythm has not worked.   °· It is safe to wait in order to allow time for preparation. °· Symptoms of the abnormal rhythm are bothersome. °· The risk of stroke and other serious complications can be reduced. °LET YOUR CAREGIVER KNOW ABOUT:  °· All medicines you are taking, including vitamins, herbs, eye drops, creams, and over-the-counter medicines.   °· Previous problems you or members of your family have had with the use of anesthetics.   °· Any blood disorders you have.   °· Previous surgeries you have had.   °· Medical conditions you have. °RISKS AND COMPLICATIONS  °Generally, this is a safe procedure. However, as with any procedure, complications can occur. Possible complications include:  °·   Breathing problems related to the anesthetic used. °· Cardiac arrest This risk is rare. °· A blood clot that breaks free and travels to other parts of your body. This could cause a stroke or other problems. The risk of this is lowered by use of blood thinning medicine (anticoagulant) prior to the procedure. °BEFORE THE PROCEDURE  °· You may have tests to detect blood clots in your heart and evaluate heart function.  °· You may  start taking anticoagulants so your blood does not clot as easily.   °· Medicines may be given to help stabilize your heart rate and rhythm. °PROCEDURE °· You will be given medicine through an IV tube to reduce discomfort and make you sleepy (sedative).   °· An electrical shock will be delivered. °AFTER THE PROCEDURE °Your heart rhythm will be watched to make sure it does not change. You may be able to go home within a few hours.  °Document Released: 08/30/2002 Document Revised: 06/30/2013 Document Reviewed: 03/24/2013 °ExitCare® Patient Information ©2014 [employer]. ° °

## 2013-11-12 NOTE — Interval H&P Note (Signed)
History and Physical Interval Note:  11/12/2013 11:07 AM  Derek Wells  has presented today for surgery, with the diagnosis of AFLUTTER  The various methods of treatment have been discussed with the patient and family. After consideration of risks, benefits and other options for treatment, the patient has consented to  Procedure(s): CARDIOVERSION (N/A) as a surgical intervention .  The patient's history has been reviewed, patient examined, no change in status, stable for surgery.  I have reviewed the patient's chart and labs.  Questions were answered to the patient's satisfaction.     [name] A

## 2013-11-12 NOTE — Telephone Encounter (Signed)
Returned call and pt verified x 2.  Pt informed message received.  Pt stated dose was increased and he takes a whole pill now.  RN confirmed pt takes metoprolol succinate 25 mg daily.  Refill(s) sent to pharmacy: [facility].  Pt verbalized understanding and agreed w/ plan.

## 2013-11-12 NOTE — Telephone Encounter (Signed)
Please call-concerning his Metoprolol.Please call today,he is completely out of it,had a cardioversion today..Marland Kitchen

## 2013-11-12 NOTE — CV Procedure (Signed)
[facility]  CARDIOVERSION NOTE   Procedure: Electrical Cardioversion Indications:  Atrial Flutter  Procedure Details:  Consent: Risks of procedure as well as the alternatives and risks of each were explained to the (patient/caregiver).  Consent for procedure obtained.  Time Out: Verified patient identification, verified procedure, site/side was marked, verified correct patient position, special equipment/implants available, medications/allergies/relevent history reviewed, required imaging and test results available.  Performed  Patient placed on cardiac monitor, pulse oximetry, supplemental oxygen as necessary.  Sedation given: propofol 120 mg Pacer pads placed anterior and posterior chest.  Cardioverted 1 time(s).  Cardioverted at 100 J Evaluation: Findings: Post procedure EKG shows: NSR Complications: None Patient did tolerate procedure well.    Lennette Biharihomas A. Karol Skarzynski, MD, [facility] 11/12/2013 11:25 AM

## 2013-11-12 NOTE — Transfer of Care (Signed)
Immediate Anesthesia Transfer of Care Note  Patient: Derek Wells  Procedure(s) Performed: Procedure(s) with comments: CARDIOVERSION (N/A) - 11:16 Lido 60mg , Propofol 120mg ,IV 11:19 synch 100 joules conversion conversion to NSR....  Patient Location: Endoscopy Unit  Anesthesia Type:General  Level of Consciousness: awake, alert  and oriented  Airway & Oxygen Therapy: Patient Spontanous Breathing  Post-op Assessment: Report given to PACU RN, Post -op Vital signs reviewed and stable and Patient moving all extremities X 4  Post vital signs: Reviewed and stable  Complications: No apparent anesthesia complications

## 2013-11-12 NOTE — Anesthesia Preprocedure Evaluation (Addendum)
Anesthesia Evaluation  Patient identified by MRN, date of birth, ID band Patient awake    Reviewed: Allergy & Precautions, H&P , NPO status , Patient's Chart, lab work & pertinent test results  Airway Mallampati: I TM Distance: >3 FB Neck ROM: Full    Dental  (+) Teeth Intact, Dental Advisory Given   Pulmonary shortness of breath, former smoker,          Cardiovascular hypertension, + angina + CAD     Neuro/Psych    GI/Hepatic   Endo/Other  obese  Renal/GU      Musculoskeletal   Abdominal   Peds  Hematology   Anesthesia Other Findings   Reproductive/Obstetrics                         Anesthesia Physical Anesthesia Plan  ASA: III  Anesthesia Plan: MAC   Post-op Pain Management:    Induction: Intravenous  Airway Management Planned: Mask  Additional Equipment:   Intra-op Plan:   Post-operative Plan:   Informed Consent: I have reviewed the patients History and Physical, chart, labs and discussed the procedure including the risks, benefits and alternatives for the proposed anesthesia with the patient or authorized representative who has indicated his/her understanding and acceptance.     Plan Discussed with: CRNA and Anesthesiologist  Anesthesia Plan Comments:        Anesthesia Quick Evaluation

## 2013-11-12 NOTE — H&P (View-Only) (Signed)
Patient ID: Derek PontJames A Cushman, male   DOB: 03/29/1933, 80 y.o.   MRN: [id number]     HPI: Derek Wells, is a 80 y.o. male who resents to the office today for followup cardiology evaluation. I had last seen him in September 2014. However, [facility] and was found to be in atrial flutter at that time. He presents now for evaluation.  Derek Wells has a history of hypertension, hyperlipidemia, and who had undergone prior abdominal aortic aneurysm repair in April 2010. I had seen him on 01/14/2013 at which time he had experienced symptoms worrisome for unstable angina. In September 2013 he previously had undergone a nuclear perfusion study  which was essentially normal although did show mild diaphragmatic attenuation. He also been found to have a atherogenic dyslipidemia pattern with reference to his lipid panel. Catheterization the following day on 01/15/2013 revealed life-threatening coronary anatomy with 60-70% ostial left main stenosis, 90-95% distal left main stenosis, 95-99% ostial LAD stenosis, and mild/moderate stenoses in the RCA. That same day he was successfully operated by Dr. Rexanne ManoBrian Bartle and underwent CABG x3 with a LIMA to the LAD, SVG to the OM, and SVG to the PDA. He had a unremarkable postoperative course and was discharged 4 days later.  He saw Derek Wells on 01/27/13 and was doing well. I saw him in followup on 03/02/2013 and he  participating  in cardiac rehabilitation. When I last saw him in September, he was in sinus rhythm and has and had resumed a good level of activity.   On January 5 he was at cardiac rehabilitation and the nurses noted that his pulse was higher than usual and stayed high at approximately 114 beats per minute despite rest. An EKG was done which showed atrial flutter. He was unaware of his heart rhythm being abnormal. He had developed a URI over the holidays prior to that and had just completed a steroid dose pack. Derek Wells was added to  his medical regimen. His blood pressure was stable and when he is seeing blue his pulse was in the 70s. He did undergo subsequent blood work on January 20 which showed normal renal function and normal electrolytes. Liver function studies were normal. Hemoglobin was 13.7 hematocrit 41.7. Platelets were normal. TSH was normal at 2.63. Magnesium was normal at 2.0. He presents now for followup evaluation.   Presently he is asymptomatic with reference to his atrial flutter. He states he did play golf several times this week. He denies bleeding. He presents for evaluation.  Past Medical History  Diagnosis Date  . Hypertension   . Coronary artery disease   . CAD (coronary artery disease), severe needing CABG 01/19/2013  . S/P CABG x 3 01/19/2013  . HTN (hypertension) 01/19/2013  . Hyperlipidemia LDL goal < 70 01/19/2013  . Chest pain     2D ECHO, 06/16/2012 - EF 50-55%, borderline low left ventricular systolic function  . Dyspnea on exertion     LEXISCAN, 06/17/2012 - fixed inferior bowel artifact and basal septal defect, probably related to IVCD/incomplete LBBB, no reversible ischemia, post-stress EF 66%  . History of AAA (abdominal aortic aneurysm) repair     ABDOMINAL AORTIC DOPPLER, 06/16/2013 - normal    Past Surgical History  Procedure Laterality Date  . Vascular surgery    . Coronary artery bypass graft N/A 01/15/2013    Procedure: Coronary Artery Bypass Graft times three using Right Greater Saphenous Vein Graft harvested endoscopically and Left  Internal Mammary Artery and;  Surgeon: Alleen Borne, MD;  Location: [facility] OR;  Service: Open Heart Surgery;  Laterality: N/A;  . Intraoperative transesophageal echocardiogram  01/15/2013    Procedure: INTRAOPERATIVE TRANSESOPHAGEAL ECHOCARDIOGRAM;  Surgeon: Alleen Borne, MD;  Location: [facility] OR;  Service: Open Heart Surgery;;  . Cardiac catheterization  01/15/2013    Severe coronary obstructive disease with 60-70% ostial left main and 90-85% distal LAD  stenosis and mild-moderate stenoses in RCA, recommended for CABG    Allergies  Allergen Reactions  . Penicillins Rash    Current Outpatient Prescriptions  Medication Sig Dispense Refill  . aspirin 81 Wells tablet Take 81 Wells by mouth daily.      Marland Kitchen atorvastatin (LIPITOR) 80 Wells tablet Take 1 tablet (80 Wells total) by mouth daily.  90 tablet  3  . fish oil-omega-3 fatty acids 1000 Wells capsule Take 1 g by mouth daily.      . metFORMIN (GLUCOPHAGE) 500 Wells tablet Take 500 Wells by mouth daily.      . metoprolol succinate (TOPROL-XL) 25 Wells 24 hr tablet 25 Wells.      . Multiple Vitamin (MULTIVITAMIN WITH MINERALS) TABS Take 1 tablet by mouth daily.      . Rivaroxaban (XARELTO) 20 Wells TABS tablet Take 1 tablet (20 Wells total) by mouth daily with supper.  30 tablet  11  . valsartan-hydrochlorothiazide (DIOVAN-HCT) 160-12.5 Wells per tablet Take 1 tablet by mouth daily.  90 tablet  2   No current facility-administered medications for this visit.    Socially he is re-married. His first wife of 42 years passed away. He previously had smoked cigars prior to his bypass surgery but has not had any tobacco use since.  ROS is negative for fevers, chills or night sweats. He denies visual symptoms. He denies palpitations. He denies wheezing. He denies recent cough following his URI symptoms which have resolved. He denies any of the discomfort that he experienced prior to his bypass surgery. There is no chest pain. He denies changes in bowel or bladder habits. There is no bleeding. He denies myalgias. His previous flushing related to Niaspan solved after he discontinued this therapy. He denies GI symptoms. He denies abdominal pain. He denies significant edema.   Residual claudication. He is unaware of significant sleep disordered breathing. Other comprehensive 14 point system review is negative.  PE BP 140/76  Pulse 70  Ht 6\' 1"  (1.854 m)  Wt 246 lb 9.6 oz (111.857 kg)  BMI 32.54 kg/m2  General: Alert, oriented, no  distress.  Skin: normal turgor, no rashes HEENT: Normocephalic, atraumatic. Pupils round and reactive; sclera anicteric;no lid lag.  Nose without nasal septal hypertrophy Mouth/Parynx benign; Mallinpatti scale 2 Neck: No JVD, no carotid bruits with normal carotid upstrokes Lungs: clear to ausculatation and percussion; no wheezing or rales Chest wall: No tenderness to palpation Heart: Mildly irregular with a controlled ventricular rate in the 70s, s1 s2 normal  1-2/6 systolic murmur ; no S3. No rub. Abdomen: soft, nontender; no hepatosplenomehaly, BS+; abdominal aorta nontender and not dilated by palpation. Back: No CVA tenderness Pulses 2+ Extremities: no clubbing cyanosis or edema, Homan's sign negative  Neurologic: grossly nonfocal; cranial nerves grossly intact Psychological: Normal affect and mood  ECG (independently read by me): Atrial flutter with variable rate but predominantly 4-1 with occasional 3-1 block; average heart rate 70 beats per minute  Prior ECG from 06/04/2013: Sinus rhythm with first-degree AV block. Nonspecific interventricular block  LABS:  BMET  Component Value Date/Time   NA 140 10/12/2013 0854   K 4.4 10/12/2013 0854   CL 102 10/12/2013 0854   CO2 29 10/12/2013 0854   GLUCOSE 100* 10/12/2013 0854   BUN 18 10/12/2013 0854   CREATININE 1.02 10/12/2013 0854   CREATININE 0.99 01/17/2013 0420   CALCIUM 9.1 10/12/2013 0854   GFRNONAA 76* 01/17/2013 0420   GFRAA 89* 01/17/2013 0420     Hepatic Function Panel     Component Value Date/Time   PROT 6.4 10/12/2013 0854   ALBUMIN 4.0 10/12/2013 0854   AST 14 10/12/2013 0854   ALT 16 10/12/2013 0854   ALKPHOS 60 10/12/2013 0854   BILITOT 0.7 10/12/2013 0854     CBC    Component Value Date/Time   WBC 8.7 10/12/2013 0854   RBC 4.80 10/12/2013 0854   HGB 13.7 10/12/2013 0854   HCT 41.7 10/12/2013 0854   PLT 314 10/12/2013 0854   MCV 86.9 10/12/2013 0854   MCH 28.5 10/12/2013 0854   MCHC 32.9 10/12/2013 0854   RDW 16.2*  10/12/2013 0854     BNP No results found for this basename: probnp    Lipid Panel     Component Value Date/Time   TRIG 134 06/14/2013 0922   LDLCALC 82 06/14/2013 0922     RADIOLOGY: No results found.    ASSESSMENT AND PLAN:  Mr. Bonfanti continues is now 10 months months status post emergency CABG revascularization surgery after cardiac catheterization demonstrated severe life threatening coronary anatomy.  He denies recurrent anginal symptoms and is in the maintenance phase of cardiac rehabilitation. Presently, he is asymptomatic with reference to his heart rhythm but he remains in atrial flutter with variable rate but mostly 4-1 block. He has been on anticoagulation with  Xarelto 20 Wells now for approximately one month. I have recommended slight additional titration of his beta blocker and have increased his Toprol-XL from his current dose of just 12.5 Wells to 25 Wells daily. Did review his recent laboratory. On prior lipid panel he was found to have an atherogenic dyslipidemia pattern. He apparently could not tolerate Niaspan in addition to his statin but seems to do well with his continued dose of atorvastatin 80 Wells daily. We discussed diet modification particularly reduction in carbohydrates and sweets. His blood pressure today is controlled. He is not having bleeding on Zaroxolyn 20 Wells. We discussed scheduling him for outpatient cardioversion. He tells me he plans to be away next week. As a result, I am tentatively scheduling this for Tuesday, 11/09/2013. On Monday, February 16, he will return to the office for ECG to verify if he is still in atrial flutter on his increased beta blocker regimen.  Discussed the cardioversion in detail with he and his wife. If he is still in atrial fibrillation on followup EKG plans will be for him to undergo cardioversion the following day.   Lennette Bihari, MD, [facility]  10/31/2013 5:08 PM

## 2013-11-15 ENCOUNTER — Other Ambulatory Visit: Payer: Self-pay | Admitting: *Deleted

## 2013-11-15 ENCOUNTER — Encounter (HOSPITAL_COMMUNITY): Payer: Self-pay | Admitting: Cardiovascular Disease

## 2013-11-15 ENCOUNTER — Encounter (HOSPITAL_COMMUNITY): Payer: Self-pay

## 2013-11-15 MED ORDER — METOPROLOL SUCCINATE ER 25 MG PO TB24: 25.0000 mg | ORAL_TABLET | Freq: Every day | ORAL | Status: DC

## 2013-11-15 NOTE — Anesthesia Postprocedure Evaluation (Signed)
Anesthesia Post Note  Patient: Derek Wells  Procedure(s) Performed: Procedure(s) (LRB): CARDIOVERSION (N/A)  Anesthesia type: General  Patient location: PACU  Post pain: Pain level controlled and Adequate analgesia  Post assessment: Post-op Vital signs reviewed, Patient's Cardiovascular Status Stable, Respiratory Function Stable, Patent Airway and Pain level controlled  Last Vitals:  Filed Vitals:   11/12/13 1223  BP: 141/74  Pulse:   Temp:   Resp: 24    Post vital signs: Reviewed and stable  Level of consciousness: awake, alert  and oriented  Complications: No apparent anesthesia complications

## 2013-11-15 NOTE — Telephone Encounter (Signed)
Metoprolol refilled electronically w/10 refills.

## 2013-11-17 ENCOUNTER — Encounter (HOSPITAL_COMMUNITY): Payer: Self-pay

## 2013-11-19 ENCOUNTER — Encounter (HOSPITAL_COMMUNITY): Payer: Self-pay

## 2013-11-22 ENCOUNTER — Encounter (HOSPITAL_COMMUNITY): Payer: Medicare Other

## 2013-11-22 DIAGNOSIS — I2510 Atherosclerotic heart disease of native coronary artery without angina pectoris: Secondary | ICD-10-CM | POA: Insufficient documentation

## 2013-11-22 DIAGNOSIS — Z951 Presence of aortocoronary bypass graft: Secondary | ICD-10-CM | POA: Insufficient documentation

## 2013-11-22 DIAGNOSIS — I7389 Other specified peripheral vascular diseases: Secondary | ICD-10-CM | POA: Insufficient documentation

## 2013-11-22 DIAGNOSIS — Z5189 Encounter for other specified aftercare: Secondary | ICD-10-CM | POA: Insufficient documentation

## 2013-11-24 ENCOUNTER — Encounter (HOSPITAL_COMMUNITY): Payer: Self-pay

## 2013-11-26 ENCOUNTER — Telehealth: Payer: Self-pay | Admitting: *Deleted

## 2013-11-26 ENCOUNTER — Encounter (HOSPITAL_COMMUNITY): Payer: Self-pay

## 2013-11-26 NOTE — Telephone Encounter (Signed)
Pt called in stating that Dr. Tresa EndoKelly told him personally to come in for an appointment for 2 weeks post hospital, which is next week. He has no appointments and does not want to see a  PA. He also stated that he has not been feeling well since the cardioversion.   TK

## 2013-11-29 ENCOUNTER — Telehealth: Payer: Self-pay | Admitting: Cardiovascular Disease

## 2013-11-29 ENCOUNTER — Encounter (HOSPITAL_COMMUNITY): Payer: Self-pay

## 2013-11-29 NOTE — Telephone Encounter (Signed)
Returned call and pt verified x 2.  Pt stated he had a cardioversion about 2 weeks ago.  Stated he went out of town and Dr. Tresa EndoKelly told him he wanted to see him as soon as he came back.  Pt stated he was told Dr. Tresa EndoKelly didn't have an appt soon.  Ivin Booty^^While on the phone, pt received a call from our Scheduling dept to schedule an appt w/ Dr. Tresa EndoKelly on Wednesday at 11 am.^^  Pt stated he is okay with that and has to call [facility] to cancel his appt on Wednesday so he can be scheduled to see Dr. Tresa EndoKelly.  Pt agreeable with that plan and no further action taken.

## 2013-11-29 NOTE — Telephone Encounter (Signed)
Please call,had a cardioversion about 2 weeks ago,he is actually feeling worse.

## 2013-11-30 NOTE — Telephone Encounter (Signed)
Patient given appointment by Inetta Fermoina for next week.

## 2013-12-01 ENCOUNTER — Encounter (HOSPITAL_COMMUNITY): Payer: Self-pay

## 2013-12-01 ENCOUNTER — Encounter: Payer: Self-pay | Admitting: Cardiovascular Disease

## 2013-12-01 ENCOUNTER — Ambulatory Visit (INDEPENDENT_AMBULATORY_CARE_PROVIDER_SITE_OTHER): Payer: Medicare Other | Admitting: Cardiovascular Disease

## 2013-12-01 VITALS — BP 122/74 | HR 71 | Ht 73.5 in | Wt 242.8 lb

## 2013-12-01 DIAGNOSIS — I2510 Atherosclerotic heart disease of native coronary artery without angina pectoris: Secondary | ICD-10-CM

## 2013-12-01 DIAGNOSIS — I4892 Unspecified atrial flutter: Secondary | ICD-10-CM

## 2013-12-01 DIAGNOSIS — I10 Essential (primary) hypertension: Secondary | ICD-10-CM

## 2013-12-01 DIAGNOSIS — Z9889 Other specified postprocedural states: Secondary | ICD-10-CM

## 2013-12-01 DIAGNOSIS — Z8679 Personal history of other diseases of the circulatory system: Secondary | ICD-10-CM

## 2013-12-01 DIAGNOSIS — Z951 Presence of aortocoronary bypass graft: Secondary | ICD-10-CM

## 2013-12-01 DIAGNOSIS — I4891 Unspecified atrial fibrillation: Secondary | ICD-10-CM

## 2013-12-01 DIAGNOSIS — E785 Hyperlipidemia, unspecified: Secondary | ICD-10-CM

## 2013-12-01 DIAGNOSIS — E782 Mixed hyperlipidemia: Secondary | ICD-10-CM

## 2013-12-01 MED ORDER — VALSARTAN 160 MG PO TABS: 160.0000 mg | ORAL_TABLET | Freq: Every day | ORAL | Status: DC

## 2013-12-01 MED ORDER — ATORVASTATIN CALCIUM 20 MG PO TABS: 20.0000 mg | ORAL_TABLET | Freq: Every day | ORAL | Status: DC

## 2013-12-01 MED ORDER — EZETIMIBE 10 MG PO TABS: 10.0000 mg | ORAL_TABLET | Freq: Every day | ORAL | Status: DC

## 2013-12-01 NOTE — Patient Instructions (Signed)
Your physician has recommended you make the following change in your medication: stop the valsartan-hct. Start new prescription for plain valsartan. Start new prescription for zetia. Decrease the atorvastatin to 20 mg . These prescriptions has already been sent to the pharmacy.  Your physician recommends that you return for lab work in: 3 months. A lab slip will be sent to you.  Your physician recommends that you schedule a follow-up appointment in: 3 months.

## 2013-12-01 NOTE — Progress Notes (Signed)
Patient ID: Derek Wells, male   DOB: [birth year], 80 y.o.   MRN: [id number]      HPI: Derek Wells, is a 80 y.o. male who resents to the office today for followup cardiology evaluation following his recent cardioversion for atrial flutter.  Derek Wells has a history of hypertension, hyperlipidemia, and who had undergone prior abdominal aortic aneurysm repair in April 2010. I had seen him on 01/14/2013 at which time he had experienced symptoms worrisome for unstable angina. In September 2013 he previously had undergone a nuclear perfusion study  which was essentially normal although did show mild diaphragmatic attenuation. He also been found to have a atherogenic dyslipidemia pattern with reference to his lipid panel. Catheterization the following day on 01/15/2013 revealed life-threatening coronary anatomy with 60-70% ostial left main stenosis, 90-95% distal left main stenosis, 95-99% ostial LAD stenosis, and mild/moderate stenoses in the RCA. That same day he was successfully operated by Dr. Rexanne Mano and underwent CABG x3 with a LIMA to the LAD, SVG to the OM, and SVG to the PDA. He had a unremarkable postoperative course and was discharged 4 days later.  He saw [name] on 01/27/13 and was doing well. I saw him in followup on 03/02/2013 and he  participating  in cardiac rehabilitation. When I saw him in September, he was in sinus rhythm and has and had resumed a good level of activity.   On January 5 he was at cardiac rehabilitation and the nurses noted that his pulse was higher than usual and stayed high at approximately 114 beats per minute despite rest. An EKG was done which showed atrial flutter. He was unaware of his heart rhythm being abnormal. He had developed a URI over the holidays prior to that and had just completed a steroid dose pack. Xarelto 20 mg was added to his medical regimen. He did undergo subsequent blood work on January 20 which showed normal renal function and normal electrolytes.  Liver function studies were normal. Hemoglobin was 13.7 hematocrit 41.7. Platelets were normal. TSH was normal at 2.63. Magnesium was normal at 2.0. He presents now for followup evaluation.   Derek Wells was last seen by me on 10/27/2013 at that time I scheduled him to undergo outpatient DC cardioversion for his continued atrial flutter. This was done successfully on 11/12/2013 and he cardioverted to sinus rhythm at 100 J.  Since his cardioversion, he did go to [place] and did play golf. He does note some fatigue. He also has noted some very mild myalgias in his thighs. He denies exertional chest pain. He has not yet resumed cardiac rehabilitation.  Past Medical History  Diagnosis Date  . Hypertension   . Coronary artery disease   . CAD (coronary artery disease), severe needing CABG 01/19/2013  . S/P CABG x 3 01/19/2013  . HTN (hypertension) 01/19/2013  . Hyperlipidemia LDL goal < 70 01/19/2013  . Chest pain     2D ECHO, 06/16/2012 - EF 50-55%, borderline low left ventricular systolic function  . Dyspnea on exertion     LEXISCAN, 06/17/2012 - fixed inferior bowel artifact and basal septal defect, probably related to IVCD/incomplete LBBB, no reversible ischemia, post-stress EF 66%  . History of AAA (abdominal aortic aneurysm) repair     ABDOMINAL AORTIC DOPPLER, 06/16/2013 - normal    Past Surgical History  Procedure Laterality Date  . Vascular surgery    . Coronary artery bypass graft N/A 01/15/2013    Procedure: Coronary Artery Bypass Graft times  three using Right Greater Saphenous Vein Graft harvested endoscopically and Left Internal Mammary Artery and;  Surgeon: Alleen BorneBryan K Bartle, MD;  Location: [facility] OR;  Service: Open Heart Surgery;  Laterality: N/A;  . Intraoperative transesophageal echocardiogram  01/15/2013    Procedure: INTRAOPERATIVE TRANSESOPHAGEAL ECHOCARDIOGRAM;  Surgeon: Alleen BorneBryan K Bartle, MD;  Location: [facility] OR;  Service: Open Heart Surgery;;  . Cardiac catheterization  01/15/2013    Severe  coronary obstructive disease with 60-70% ostial left main and 90-85% distal LAD stenosis and mild-moderate stenoses in RCA, recommended for CABG  . Cardioversion N/A 11/12/2013    Procedure: CARDIOVERSION;  Surgeon: Lennette Biharihomas A Kelly, MD;  [facility];  Service: Cardiovascular;  Laterality: N/A;  11:16 Lido 60mg , Propofol 120mg ,IV 11:19 synch 100 joules conversion conversion to NSR....    Allergies  Allergen Reactions  . Penicillins Rash    Current Outpatient Prescriptions  Medication Sig Dispense Refill  . aspirin 81 MG tablet Take 81 mg by mouth daily.      . fish oil-omega-3 fatty acids 1000 MG capsule Take 1 g by mouth daily.      . metFORMIN (GLUCOPHAGE) 500 MG tablet Take 500 mg by mouth daily.      . metoprolol succinate (TOPROL-XL) 25 MG 24 hr tablet Take 1 tablet (25 mg total) by mouth daily.  30 tablet  10  . Multiple Vitamin (MULTIVITAMIN WITH MINERALS) TABS Take 1 tablet by mouth daily.      . Rivaroxaban (XARELTO) 20 MG TABS tablet Take 1 tablet (20 mg total) by mouth daily with supper.  30 tablet  11  . atorvastatin (LIPITOR) 20 MG tablet Take 1 tablet (20 mg total) by mouth daily.  30 tablet  6  . ezetimibe (ZETIA) 10 MG tablet Take 1 tablet (10 mg total) by mouth daily.  30 tablet  6  . valsartan (DIOVAN) 160 MG tablet Take 1 tablet (160 mg total) by mouth daily.  30 tablet  6   No current facility-administered medications for this visit.    Socially he is re-married. His first wife of 42 years passed away. He previously had smoked cigars prior to his bypass surgery but has not had any tobacco use since.  ROS is negative for fevers, chills or night sweats. He denies visual symptoms. Denies change in hearing. He does have remote history of vertigo. He denies palpitations. He denies wheezing. He denies recent cough following his URI symptoms which have resolved. He denies any of the discomfort that he experienced prior to his bypass surgery. There is no chest pain. He  denies changes in bowel or bladder habits. There is no bleeding. He denies myalgias.  He denies GI symptoms. He denies abdominal pain. He denies significant edema.   There is no claudication. He is unaware of significant sleep disordered breathing. Other comprehensive 14 point system review is negative.  PE BP 122/74  Pulse 71  Ht 6' 1.5" (1.867 m)  Wt 242 lb 12.8 oz (110.133 kg)  BMI 31.60 kg/m2  Repeat blood pressure by me was 130/70 supine and in the standing position his blood pressure is 112/68. General: Alert, oriented, no distress.  Skin: normal turgor, no rashes HEENT: Normocephalic, atraumatic. Pupils round and reactive; sclera anicteric;no lid lag.  Nose without nasal septal hypertrophy Mouth/Parynx benign; Mallinpatti scale 2 Neck: No JVD, no carotid bruits with normal carotid upstrokes Lungs: clear to ausculatation and percussion; no wheezing or rales Chest wall: No tenderness to palpation Heart: Mildly irregular with a controlled  ventricular rate in the 70s, s1 s2 normal  1-2/6 systolic murmur ; no S3. No rub. Abdomen: soft, nontender; no hepatosplenomehaly, BS+; abdominal aorta nontender and not dilated by palpation. Back: No CVA tenderness Pulses 2+ Extremities: no clubbing cyanosis or edema, Homan's sign negative  Neurologic: grossly nonfocal; cranial nerves grossly intact Psychological: Normal affect and mood  ECG today (independently read by me):  Normal sinus rhythm at 71 beats per minute. First create a block with PR interval at 240 ms. QTc interval normal at 4-5 ms.  Prior 10/27/13 ECG (independently read by me): Atrial flutter with variable rate but predominantly 4-1 with occasional 3-1 block; average heart rate 70 beats per minute  Prior ECG from 06/04/2013: Sinus rhythm with first-degree AV block. Nonspecific interventricular block  LABS:  BMET    Component Value Date/Time   NA 140 11/08/2013 1043   K 4.1 11/08/2013 1043   CL 105 11/08/2013 1043   CO2 25  11/08/2013 1043   GLUCOSE 108* 11/08/2013 1043   BUN 15 11/08/2013 1043   CREATININE 0.96 11/08/2013 1043   CREATININE 0.99 01/17/2013 0420   CALCIUM 9.2 11/08/2013 1043   GFRNONAA 76* 01/17/2013 0420   GFRAA 89* 01/17/2013 0420     Hepatic Function Panel     Component Value Date/Time   PROT 6.4 10/12/2013 0854   ALBUMIN 4.0 10/12/2013 0854   AST 14 10/12/2013 0854   ALT 16 10/12/2013 0854   ALKPHOS 60 10/12/2013 0854   BILITOT 0.7 10/12/2013 0854     CBC    Component Value Date/Time   WBC 9.2 11/08/2013 1043   RBC 4.73 11/08/2013 1043   HGB 13.3 11/08/2013 1043   HCT 39.6 11/08/2013 1043   PLT 326 11/08/2013 1043   MCV 83.7 11/08/2013 1043   MCH 28.1 11/08/2013 1043   MCHC 33.6 11/08/2013 1043   RDW 16.3* 11/08/2013 1043     BNP No results found for this basename: probnp    Lipid Panel     Component Value Date/Time   TRIG 134 06/14/2013 0922   LDLCALC 82 06/14/2013 0922     RADIOLOGY: No results found.    ASSESSMENT AND PLAN:  Derek Wells  is now 10 months months status post emergency CABG revascularization surgery after cardiac catheterization demonstrated severe life threatening coronary anatomy.  He denies recurrent anginal symptoms and is in the maintenance phase of cardiac rehabilitation. He had developed atrial flutter in January and was started on anticoagulation with Xarelto. He is now status post successful DC cardioversion and he is maintaining normal sinus rhythm with first degree AV block. He does note some mild episodes of dizziness. He does not have true vertigo. He is borderline orthostatic today on physical examination. I am recommending that he discontinue his valsartan hydrochlorothiazide combination regimen which is 160/12.5 and I will change this to just valsartan 160 mg daily without the diuretic. He also has noted some mild leg discomfort. An NMR profile had shown increased LDL particle #1266 and he has been on atorvastatin 40 mg. I will reduce his atorvastatin 20  mg in light of his mild myalgias but am starting him on Zetia 10 mg which should provide additional significant LDL lowering. I also suggested the addition of coenzyme Q10 which may help with the myalgias. In 3 months repeat laboratory will be obtained. I will see him in 4 months for followup cardiology evaluation.   Lennette Bihari, MD, [facility]  12/01/2013 7:10 PM

## 2013-12-03 ENCOUNTER — Encounter (HOSPITAL_COMMUNITY)
Admission: RE | Admit: 2013-12-03 | Discharge: 2013-12-03 | Disposition: A | Discharge status: Home / Self Care | Payer: Self-pay | Source: Ambulatory Visit | Attending: Cardiovascular Disease | Admitting: Cardiovascular Disease

## 2013-12-06 ENCOUNTER — Encounter (HOSPITAL_COMMUNITY)
Admission: RE | Admit: 2013-12-06 | Discharge: 2013-12-06 | Disposition: A | Discharge status: Home / Self Care | Payer: Self-pay | Source: Ambulatory Visit | Attending: Cardiovascular Disease | Admitting: Cardiovascular Disease

## 2013-12-08 ENCOUNTER — Encounter (HOSPITAL_COMMUNITY)
Admission: RE | Admit: 2013-12-08 | Discharge: 2013-12-08 | Disposition: A | Discharge status: Home / Self Care | Payer: Self-pay | Source: Ambulatory Visit | Attending: Cardiovascular Disease | Admitting: Cardiovascular Disease

## 2013-12-10 ENCOUNTER — Encounter (HOSPITAL_COMMUNITY): Payer: Self-pay

## 2013-12-13 ENCOUNTER — Encounter (HOSPITAL_COMMUNITY)
Admission: RE | Admit: 2013-12-13 | Discharge: 2013-12-13 | Disposition: A | Discharge status: Home / Self Care | Payer: Self-pay | Source: Ambulatory Visit | Attending: Cardiovascular Disease | Admitting: Cardiovascular Disease

## 2013-12-13 ENCOUNTER — Ambulatory Visit: Payer: Medicare Other | Admitting: Cardiovascular Disease

## 2013-12-15 ENCOUNTER — Encounter (HOSPITAL_COMMUNITY)
Admission: RE | Admit: 2013-12-15 | Discharge: 2013-12-15 | Disposition: A | Discharge status: Home / Self Care | Payer: Self-pay | Source: Ambulatory Visit | Attending: Cardiovascular Disease | Admitting: Cardiovascular Disease

## 2013-12-17 ENCOUNTER — Encounter (HOSPITAL_COMMUNITY)
Admission: RE | Admit: 2013-12-17 | Discharge: 2013-12-17 | Disposition: A | Discharge status: Home / Self Care | Payer: Self-pay | Source: Ambulatory Visit | Attending: Cardiovascular Disease | Admitting: Cardiovascular Disease

## 2013-12-20 ENCOUNTER — Encounter (HOSPITAL_COMMUNITY)
Admission: RE | Admit: 2013-12-20 | Discharge: 2013-12-20 | Disposition: A | Discharge status: Home / Self Care | Payer: Self-pay | Source: Ambulatory Visit | Attending: Cardiovascular Disease | Admitting: Cardiovascular Disease

## 2013-12-22 ENCOUNTER — Encounter (HOSPITAL_COMMUNITY)
Admission: RE | Admit: 2013-12-22 | Discharge: 2013-12-22 | Disposition: A | Discharge status: Home / Self Care | Payer: Self-pay | Source: Ambulatory Visit | Attending: Cardiovascular Disease | Admitting: Cardiovascular Disease

## 2013-12-22 DIAGNOSIS — Z5189 Encounter for other specified aftercare: Secondary | ICD-10-CM | POA: Insufficient documentation

## 2013-12-22 DIAGNOSIS — Z951 Presence of aortocoronary bypass graft: Secondary | ICD-10-CM | POA: Insufficient documentation

## 2013-12-22 DIAGNOSIS — I2510 Atherosclerotic heart disease of native coronary artery without angina pectoris: Secondary | ICD-10-CM | POA: Insufficient documentation

## 2013-12-22 DIAGNOSIS — I7389 Other specified peripheral vascular diseases: Secondary | ICD-10-CM | POA: Insufficient documentation

## 2013-12-24 ENCOUNTER — Encounter (HOSPITAL_COMMUNITY): Payer: Self-pay

## 2013-12-27 ENCOUNTER — Encounter (HOSPITAL_COMMUNITY)
Admission: RE | Admit: 2013-12-27 | Discharge: 2013-12-27 | Disposition: A | Discharge status: Home / Self Care | Payer: Self-pay | Source: Ambulatory Visit | Attending: Cardiovascular Disease | Admitting: Cardiovascular Disease

## 2013-12-29 ENCOUNTER — Encounter (HOSPITAL_COMMUNITY)
Admission: RE | Admit: 2013-12-29 | Discharge: 2013-12-29 | Disposition: A | Discharge status: Home / Self Care | Payer: Self-pay | Source: Ambulatory Visit | Attending: Cardiovascular Disease | Admitting: Cardiovascular Disease

## 2013-12-30 ENCOUNTER — Telehealth: Payer: Self-pay | Admitting: *Deleted

## 2013-12-30 NOTE — Telephone Encounter (Signed)
Returned cardiac rehab prescription.

## 2013-12-31 ENCOUNTER — Encounter (HOSPITAL_COMMUNITY)
Admission: RE | Admit: 2013-12-31 | Discharge: 2013-12-31 | Disposition: A | Discharge status: Home / Self Care | Payer: Self-pay | Source: Ambulatory Visit | Attending: Cardiovascular Disease | Admitting: Cardiovascular Disease

## 2014-01-03 ENCOUNTER — Encounter (HOSPITAL_COMMUNITY)
Admission: RE | Admit: 2014-01-03 | Discharge: 2014-01-03 | Disposition: A | Discharge status: Home / Self Care | Payer: Self-pay | Source: Ambulatory Visit | Attending: Cardiovascular Disease | Admitting: Cardiovascular Disease

## 2014-01-05 ENCOUNTER — Encounter (HOSPITAL_COMMUNITY): Payer: Self-pay

## 2014-01-07 ENCOUNTER — Encounter (HOSPITAL_COMMUNITY): Payer: Self-pay

## 2014-01-10 ENCOUNTER — Encounter (HOSPITAL_COMMUNITY): Payer: Self-pay

## 2014-01-12 ENCOUNTER — Encounter (HOSPITAL_COMMUNITY): Payer: Self-pay

## 2014-01-14 ENCOUNTER — Encounter (HOSPITAL_COMMUNITY): Payer: Self-pay

## 2014-01-17 ENCOUNTER — Encounter (HOSPITAL_COMMUNITY)
Admission: RE | Admit: 2014-01-17 | Discharge: 2014-01-17 | Disposition: A | Discharge status: Home / Self Care | Payer: Self-pay | Source: Ambulatory Visit | Attending: Cardiovascular Disease | Admitting: Cardiovascular Disease

## 2014-01-19 ENCOUNTER — Encounter (HOSPITAL_COMMUNITY)
Admission: RE | Admit: 2014-01-19 | Discharge: 2014-01-19 | Disposition: A | Discharge status: Home / Self Care | Payer: Self-pay | Source: Ambulatory Visit | Attending: Cardiovascular Disease | Admitting: Cardiovascular Disease

## 2014-01-21 ENCOUNTER — Encounter (HOSPITAL_COMMUNITY)
Admission: RE | Admit: 2014-01-21 | Discharge: 2014-01-21 | Disposition: A | Discharge status: Home / Self Care | Payer: Self-pay | Source: Ambulatory Visit | Attending: Cardiovascular Disease | Admitting: Cardiovascular Disease

## 2014-01-21 DIAGNOSIS — I2510 Atherosclerotic heart disease of native coronary artery without angina pectoris: Secondary | ICD-10-CM | POA: Insufficient documentation

## 2014-01-21 DIAGNOSIS — Z951 Presence of aortocoronary bypass graft: Secondary | ICD-10-CM | POA: Insufficient documentation

## 2014-01-21 DIAGNOSIS — I7389 Other specified peripheral vascular diseases: Secondary | ICD-10-CM | POA: Insufficient documentation

## 2014-01-21 DIAGNOSIS — Z5189 Encounter for other specified aftercare: Secondary | ICD-10-CM | POA: Insufficient documentation

## 2014-01-24 ENCOUNTER — Encounter (HOSPITAL_COMMUNITY): Payer: Self-pay

## 2014-01-26 ENCOUNTER — Encounter (HOSPITAL_COMMUNITY): Payer: Self-pay

## 2014-01-28 ENCOUNTER — Encounter (HOSPITAL_COMMUNITY): Payer: Self-pay

## 2014-01-31 ENCOUNTER — Encounter (HOSPITAL_COMMUNITY)
Admission: RE | Admit: 2014-01-31 | Discharge: 2014-01-31 | Disposition: A | Discharge status: Home / Self Care | Payer: Self-pay | Source: Ambulatory Visit | Attending: Cardiovascular Disease | Admitting: Cardiovascular Disease

## 2014-02-02 ENCOUNTER — Encounter (HOSPITAL_COMMUNITY)
Admission: RE | Admit: 2014-02-02 | Discharge: 2014-02-02 | Disposition: A | Discharge status: Home / Self Care | Payer: Self-pay | Source: Ambulatory Visit | Attending: Cardiovascular Disease | Admitting: Cardiovascular Disease

## 2014-02-04 ENCOUNTER — Encounter (HOSPITAL_COMMUNITY): Payer: Self-pay

## 2014-02-07 ENCOUNTER — Encounter (HOSPITAL_COMMUNITY)
Admission: RE | Admit: 2014-02-07 | Discharge: 2014-02-07 | Disposition: A | Discharge status: Home / Self Care | Payer: Self-pay | Source: Ambulatory Visit | Attending: Cardiovascular Disease | Admitting: Cardiovascular Disease

## 2014-02-09 ENCOUNTER — Encounter (HOSPITAL_COMMUNITY)
Admission: RE | Admit: 2014-02-09 | Discharge: 2014-02-09 | Disposition: A | Discharge status: Home / Self Care | Payer: Self-pay | Source: Ambulatory Visit | Attending: Cardiovascular Disease | Admitting: Cardiovascular Disease

## 2014-02-11 ENCOUNTER — Encounter (HOSPITAL_COMMUNITY)
Admission: RE | Admit: 2014-02-11 | Discharge: 2014-02-11 | Disposition: A | Discharge status: Home / Self Care | Payer: Self-pay | Source: Ambulatory Visit | Attending: Cardiovascular Disease | Admitting: Cardiovascular Disease

## 2014-02-14 ENCOUNTER — Encounter (HOSPITAL_COMMUNITY): Payer: Self-pay

## 2014-02-16 ENCOUNTER — Encounter (HOSPITAL_COMMUNITY)
Admission: RE | Admit: 2014-02-16 | Discharge: 2014-02-16 | Disposition: A | Discharge status: Home / Self Care | Payer: Self-pay | Source: Ambulatory Visit | Attending: Cardiovascular Disease | Admitting: Cardiovascular Disease

## 2014-02-18 ENCOUNTER — Encounter (HOSPITAL_COMMUNITY)
Admission: RE | Admit: 2014-02-18 | Discharge: 2014-02-18 | Disposition: A | Discharge status: Home / Self Care | Payer: Self-pay | Source: Ambulatory Visit | Attending: Cardiovascular Disease | Admitting: Cardiovascular Disease

## 2014-02-21 ENCOUNTER — Encounter (HOSPITAL_COMMUNITY)
Admission: RE | Admit: 2014-02-21 | Discharge: 2014-02-21 | Disposition: A | Discharge status: Home / Self Care | Payer: Self-pay | Source: Ambulatory Visit | Attending: Cardiovascular Disease | Admitting: Cardiovascular Disease

## 2014-02-21 DIAGNOSIS — I4892 Unspecified atrial flutter: Secondary | ICD-10-CM | POA: Insufficient documentation

## 2014-02-21 DIAGNOSIS — I10 Essential (primary) hypertension: Secondary | ICD-10-CM | POA: Insufficient documentation

## 2014-02-21 DIAGNOSIS — Z951 Presence of aortocoronary bypass graft: Secondary | ICD-10-CM | POA: Insufficient documentation

## 2014-02-21 DIAGNOSIS — I200 Unstable angina: Secondary | ICD-10-CM | POA: Insufficient documentation

## 2014-02-21 DIAGNOSIS — E785 Hyperlipidemia, unspecified: Secondary | ICD-10-CM | POA: Insufficient documentation

## 2014-02-21 DIAGNOSIS — Z9889 Other specified postprocedural states: Secondary | ICD-10-CM | POA: Insufficient documentation

## 2014-02-21 DIAGNOSIS — Z5189 Encounter for other specified aftercare: Secondary | ICD-10-CM | POA: Insufficient documentation

## 2014-02-21 DIAGNOSIS — I2510 Atherosclerotic heart disease of native coronary artery without angina pectoris: Secondary | ICD-10-CM | POA: Insufficient documentation

## 2014-02-23 ENCOUNTER — Encounter (HOSPITAL_COMMUNITY)
Admission: RE | Admit: 2014-02-23 | Discharge: 2014-02-23 | Disposition: A | Discharge status: Home / Self Care | Payer: Self-pay | Source: Ambulatory Visit | Attending: Cardiovascular Disease | Admitting: Cardiovascular Disease

## 2014-02-24 ENCOUNTER — Telehealth: Payer: Self-pay | Admitting: Cardiovascular Disease

## 2014-02-24 ENCOUNTER — Other Ambulatory Visit: Payer: Self-pay | Admitting: *Deleted

## 2014-02-24 DIAGNOSIS — R5383 Other fatigue: Secondary | ICD-10-CM

## 2014-02-24 DIAGNOSIS — E119 Type 2 diabetes mellitus without complications: Secondary | ICD-10-CM

## 2014-02-24 DIAGNOSIS — R5381 Other malaise: Secondary | ICD-10-CM

## 2014-02-24 DIAGNOSIS — I4891 Unspecified atrial fibrillation: Secondary | ICD-10-CM

## 2014-02-24 DIAGNOSIS — E782 Mixed hyperlipidemia: Secondary | ICD-10-CM

## 2014-02-24 DIAGNOSIS — Z79899 Other long term (current) drug therapy: Secondary | ICD-10-CM

## 2014-02-24 NOTE — Telephone Encounter (Signed)
Results mailed out.

## 2014-02-24 NOTE — Telephone Encounter (Signed)
Disregard previous note; what i meant to say was his lab slips were mailed out to pt

## 2014-02-24 NOTE — Telephone Encounter (Signed)
Is asking for a order to have lab work done before his appt on 03/07/14 mailed to him please .Marland Kitchen Thanks

## 2014-02-24 NOTE — Telephone Encounter (Signed)
Lab slip mailed to pt. 

## 2014-02-25 ENCOUNTER — Encounter (HOSPITAL_COMMUNITY)
Admission: RE | Admit: 2014-02-25 | Discharge: 2014-02-25 | Disposition: A | Discharge status: Home / Self Care | Payer: Self-pay | Source: Ambulatory Visit | Attending: Cardiovascular Disease | Admitting: Cardiovascular Disease

## 2014-02-28 ENCOUNTER — Encounter (HOSPITAL_COMMUNITY)
Admission: RE | Admit: 2014-02-28 | Discharge: 2014-02-28 | Disposition: A | Discharge status: Home / Self Care | Payer: Self-pay | Source: Ambulatory Visit | Attending: Cardiovascular Disease | Admitting: Cardiovascular Disease

## 2014-03-02 ENCOUNTER — Encounter (HOSPITAL_COMMUNITY)
Admission: RE | Admit: 2014-03-02 | Discharge: 2014-03-02 | Disposition: A | Discharge status: Home / Self Care | Payer: Self-pay | Source: Ambulatory Visit | Attending: Cardiovascular Disease | Admitting: Cardiovascular Disease

## 2014-03-04 ENCOUNTER — Encounter (HOSPITAL_COMMUNITY)
Admission: RE | Admit: 2014-03-04 | Discharge: 2014-03-04 | Disposition: A | Discharge status: Home / Self Care | Payer: Self-pay | Source: Ambulatory Visit | Attending: Cardiovascular Disease | Admitting: Cardiovascular Disease

## 2014-03-07 ENCOUNTER — Ambulatory Visit (INDEPENDENT_AMBULATORY_CARE_PROVIDER_SITE_OTHER): Payer: Medicare Other | Admitting: Cardiovascular Disease

## 2014-03-07 ENCOUNTER — Encounter (HOSPITAL_COMMUNITY): Payer: Self-pay | Admitting: *Deleted

## 2014-03-07 ENCOUNTER — Encounter (HOSPITAL_COMMUNITY): Payer: Self-pay

## 2014-03-07 ENCOUNTER — Encounter: Payer: Self-pay | Admitting: Cardiovascular Disease

## 2014-03-07 ENCOUNTER — Other Ambulatory Visit: Payer: Self-pay | Admitting: Cardiovascular Disease

## 2014-03-07 VITALS — BP 124/50 | HR 68 | Ht 73.5 in | Wt 245.5 lb

## 2014-03-07 DIAGNOSIS — Z8679 Personal history of other diseases of the circulatory system: Secondary | ICD-10-CM

## 2014-03-07 DIAGNOSIS — Z7901 Long term (current) use of anticoagulants: Secondary | ICD-10-CM

## 2014-03-07 DIAGNOSIS — Z9889 Other specified postprocedural states: Secondary | ICD-10-CM

## 2014-03-07 DIAGNOSIS — I4892 Unspecified atrial flutter: Secondary | ICD-10-CM

## 2014-03-07 DIAGNOSIS — I10 Essential (primary) hypertension: Secondary | ICD-10-CM

## 2014-03-07 DIAGNOSIS — E785 Hyperlipidemia, unspecified: Secondary | ICD-10-CM

## 2014-03-07 DIAGNOSIS — Z951 Presence of aortocoronary bypass graft: Secondary | ICD-10-CM

## 2014-03-07 DIAGNOSIS — N429 Disorder of prostate, unspecified: Secondary | ICD-10-CM

## 2014-03-07 DIAGNOSIS — I2510 Atherosclerotic heart disease of native coronary artery without angina pectoris: Secondary | ICD-10-CM

## 2014-03-07 LAB — TSH: TSH: 2.648 u[IU]/mL (ref 0.350–4.500)

## 2014-03-07 LAB — CBC WITH DIFFERENTIAL/PLATELET
Basophils Absolute: 0.1 10*3/uL (ref 0.0–0.1)
Basophils Relative: 1 % (ref 0–1)
EOS ABS: 0.2 10*3/uL (ref 0.0–0.7)
Eosinophils Relative: 3 % (ref 0–5)
HCT: 38.2 % — ABNORMAL LOW (ref 39.0–52.0)
HEMOGLOBIN: 12.6 g/dL — AB (ref 13.0–17.0)
LYMPHS PCT: 25 % (ref 12–46)
Lymphs Abs: 1.9 10*3/uL (ref 0.7–4.0)
MCH: 27 pg (ref 26.0–34.0)
MCHC: 33 g/dL (ref 30.0–36.0)
MCV: 82 fL (ref 78.0–100.0)
MONOS PCT: 9 % (ref 3–12)
Monocytes Absolute: 0.7 10*3/uL (ref 0.1–1.0)
NEUTROS ABS: 4.8 10*3/uL (ref 1.7–7.7)
NEUTROS PCT: 62 % (ref 43–77)
PLATELETS: 279 10*3/uL (ref 150–400)
RBC: 4.66 MIL/uL (ref 4.22–5.81)
RDW: 16.1 % — ABNORMAL HIGH (ref 11.5–15.5)
WBC: 7.7 10*3/uL (ref 4.0–10.5)

## 2014-03-07 NOTE — Patient Instructions (Signed)
Your physician has requested that you have en exercise stress myoview. For further information please visit [email or web address]. Please follow instruction sheet, as given.  Your physician has requested that you have an echocardiogram. Echocardiography is a painless test that uses sound waves to create images of your heart. It provides your doctor with information about the size and shape of your heart and how well your heart's chambers and valves are working. This procedure takes approximately one hour. There are no restrictions for this procedure.  Your physician recommends that you schedule a follow-up appointment in: August 2015.  Your physician has recommended you make the following change in your medication: HOLD YOUR METOPROLOLTHE MORNING BEFORE AND THE MORNING OF YOUR TEST.

## 2014-03-08 ENCOUNTER — Other Ambulatory Visit (HOSPITAL_COMMUNITY): Payer: Self-pay | Admitting: Cardiovascular Disease

## 2014-03-08 ENCOUNTER — Encounter: Payer: Self-pay | Admitting: Cardiovascular Disease

## 2014-03-08 ENCOUNTER — Telehealth: Payer: Self-pay | Admitting: *Deleted

## 2014-03-08 DIAGNOSIS — Z7901 Long term (current) use of anticoagulants: Secondary | ICD-10-CM | POA: Insufficient documentation

## 2014-03-08 DIAGNOSIS — I2510 Atherosclerotic heart disease of native coronary artery without angina pectoris: Secondary | ICD-10-CM

## 2014-03-08 LAB — COMPREHENSIVE METABOLIC PANEL
ALBUMIN: 4.2 g/dL (ref 3.5–5.2)
ALT: 16 U/L (ref 0–53)
AST: 18 U/L (ref 0–37)
Alkaline Phosphatase: 59 U/L (ref 39–117)
BILIRUBIN TOTAL: 0.7 mg/dL (ref 0.2–1.2)
BUN: 17 mg/dL (ref 6–23)
CALCIUM: 9.5 mg/dL (ref 8.4–10.5)
CHLORIDE: 103 meq/L (ref 96–112)
CO2: 25 mEq/L (ref 19–32)
Creat: 1.08 mg/dL (ref 0.50–1.35)
GLUCOSE: 105 mg/dL — AB (ref 70–99)
POTASSIUM: 4.4 meq/L (ref 3.5–5.3)
Sodium: 137 mEq/L (ref 135–145)
Total Protein: 6.8 g/dL (ref 6.0–8.3)

## 2014-03-08 LAB — NMR LIPOPROFILE WITH LIPIDS
CHOLESTEROL, TOTAL: 130 mg/dL (ref <200)
HDL PARTICLE NUMBER: 32.1 umol/L (ref >=30.5)
HDL Size: 8.8 nm — ABNORMAL LOW (ref >=9.2)
HDL-C: 43 mg/dL (ref >=40)
LARGE HDL: 4.3 umol/L — AB (ref >=4.8)
LDL CALC: 59 mg/dL (ref <100)
LDL Particle Number: 1044 nmol/L — ABNORMAL HIGH (ref <1000)
LDL SIZE: 20.6 nm (ref >20.5)
LP-IR Score: 63 — ABNORMAL HIGH (ref <=45)
Large VLDL-P: 5 nmol/L — ABNORMAL HIGH (ref <=2.7)
SMALL LDL PARTICLE NUMBER: 530 nmol/L — AB (ref <=527)
Triglycerides: 138 mg/dL (ref <150)
VLDL Size: 49 nm — ABNORMAL HIGH (ref <=46.6)

## 2014-03-08 LAB — HEMOGLOBIN A1C
HEMOGLOBIN A1C: 6.4 % — AB (ref <5.7)
Mean Plasma Glucose: 137 mg/dL — ABNORMAL HIGH (ref <117)

## 2014-03-08 NOTE — Telephone Encounter (Signed)
Informed patient that I called [facility] and got the PSA lab test ordered. Spoke with Imani with [facility]  Customer service.

## 2014-03-08 NOTE — Progress Notes (Signed)
Patient ID: Derek PontJames A Calamari, male   DOB: [birth year], 80 y.o.   MRN: [id number]      HPI: Derek Wells is a 80 y.o. male who resents to the office today for 3 month followup cardiology evaluation   Derek Wells has a history of hypertension, hyperlipidemia, and who had undergone prior abdominal aortic aneurysm repair in April 2010. I had seen him on 01/14/2013 at which time he had experienced symptoms worrisome for unstable angina. In September 2013 he previously had undergone a nuclear perfusion study  which was essentially normal although did show mild diaphragmatic attenuation. He also been found to have a atherogenic dyslipidemia pattern with reference to his lipid panel. Catheterization the following day on 01/15/2013 revealed life-threatening coronary anatomy with 60-70% ostial left main stenosis, 90-95% distal left main stenosis, 95-99% ostial LAD stenosis, and mild/moderate stenoses in the RCA. That same day he was successfully operated by Dr. Rexanne ManoBrian Bartle and underwent CABG x3 with a LIMA to the LAD, SVG to the OM, and SVG to the PDA. He had a unremarkable postoperative course and was discharged 4 days later.  He saw Huey BienenstockBrian Hager on 01/27/13 and was doing well. I saw him in followup on 03/02/2013 and he  participating  in cardiac rehabilitation. When I saw him in September 2014, he was in sinus rhythm and has and had resumed a good level of activity.  In January 2015, he was found to be in atrial flutter at cardiac rehabilitation. He was unaware of his heart rhythm being abnormal. He had developed a URI over the holidays prior to that and had just completed a steroid dose pack. Xarelto 20 mg was added to his medical regimen. He did undergo subsequent blood work on January 20 which showed normal renal function and normal electrolytes. Liver function studies were normal. Hemoglobin was 13.7 hematocrit 41.7. Platelets were normal. TSH was normal at 2.63. Magnesium was normal at 2.0.   He underwent successful  cardioversion for his catrial flutter on 11/12/2013 and he cardioverted to sinus rhythm at 100 J.  Since his cardioversion, he is unaware of any recurrent rhythm disturbance.  [place].  He has been playing golf approximately one to 2 times per week.  He is in the maintenance phase of cardiac rehabilitation.  He has noticed several episodes of a vague chest sensation particularly when he rolls his garbage can back up a hill at home.  He presents for followup evaluation.  Past Medical History  Diagnosis Date  . Hypertension   . Coronary artery disease   . CAD (coronary artery disease), severe needing CABG 01/19/2013  . S/P CABG x 3 01/19/2013  . HTN (hypertension) 01/19/2013  . Hyperlipidemia LDL goal < 70 01/19/2013  . Chest pain     2D ECHO, 06/16/2012 - EF 50-55%, borderline low left ventricular systolic function  . Dyspnea on exertion     LEXISCAN, 06/17/2012 - fixed inferior bowel artifact and basal septal defect, probably related to IVCD/incomplete LBBB, no reversible ischemia, post-stress EF 66%  . History of AAA (abdominal aortic aneurysm) repair     ABDOMINAL AORTIC DOPPLER, 06/16/2013 - normal    Past Surgical History  Procedure Laterality Date  . Vascular surgery    . Coronary artery bypass graft N/A 01/15/2013    Procedure: Coronary Artery Bypass Graft times three using Right Greater Saphenous Vein Graft harvested endoscopically and Left Internal Mammary Artery and;  Surgeon: Alleen BorneBryan K Bartle,  MD;  Location: [facility] OR;  Service: Open Heart Surgery;  Laterality: N/A;  . Intraoperative transesophageal echocardiogram  01/15/2013    Procedure: INTRAOPERATIVE TRANSESOPHAGEAL ECHOCARDIOGRAM;  Surgeon: Alleen BorneBryan K Bartle, MD;  Location: [facility] OR;  Service: Open Heart Surgery;;  . Cardiac catheterization  01/15/2013    Severe coronary obstructive disease with 60-70% ostial left main and 90-85% distal LAD stenosis and mild-moderate stenoses in RCA, recommended for  CABG  . Cardioversion N/A 11/12/2013    Procedure: CARDIOVERSION;  Surgeon: Lennette Biharihomas A Kelly, MD;  [facility];  Service: Cardiovascular;  Laterality: N/A;  11:16 Lido 60mg , Propofol 120mg ,IV 11:19 synch 100 joules conversion conversion to NSR....    Allergies  Allergen Reactions  . Penicillins Rash    Current Outpatient Prescriptions  Medication Sig Dispense Refill  . aspirin 81 MG tablet Take 81 mg by mouth daily.      Marland Kitchen. atorvastatin (LIPITOR) 20 MG tablet Take 1 tablet (20 mg total) by mouth daily.  30 tablet  6  . Coenzyme Q10 Liposomal 100 MG/ML LIQD Take 100 mg by mouth daily.      Marland Kitchen. ezetimibe (ZETIA) 10 MG tablet Take 1 tablet (10 mg total) by mouth daily.  30 tablet  6  . fish oil-omega-3 fatty acids 1000 MG capsule Take 1 g by mouth daily.      . metFORMIN (GLUCOPHAGE) 500 MG tablet Take 500 mg by mouth daily.      . metoprolol succinate (TOPROL-XL) 25 MG 24 hr tablet Take 1 tablet (25 mg total) by mouth daily.  30 tablet  10  . Multiple Vitamin (MULTIVITAMIN WITH MINERALS) TABS Take 1 tablet by mouth daily.      . Rivaroxaban (XARELTO) 20 MG TABS tablet Take 1 tablet (20 mg total) by mouth daily with supper.  30 tablet  11  . valsartan (DIOVAN) 160 MG tablet Take 1 tablet (160 mg total) by mouth daily.  30 tablet  6   No current facility-administered medications for this visit.    Socially he is re-married. His first wife of 42 years passed away. He previously had smoked cigars prior to his bypass surgery but has not had any tobacco use since.  ROS General: Negative; No fevers, chills, or night sweats;  HEENT: Negative; No changes in vision or hearing, sinus congestion, difficulty swallowing Pulmonary: URI symptoms during the winter; No cough, wheezing, shortness of breath, hemoptysis Cardiovascular: Negative; No chest pain, presyncope, syncope, palpatations GI: Negative; No nausea, vomiting, diarrhea, or abdominal pain GU: Negative; No dysuria, hematuria, or  difficulty voiding Musculoskeletal: Negative; no myalgias, joint pain, or weakness Hematologic/Oncology: Negative; no easy bruising, bleeding Endocrine: Negative; no heat/cold intolerance; no diabetes Neuro: Negative; no changes in balance, headaches Skin: Negative; No rashes or skin lesions Psychiatric: Negative; No behavioral problems, depression Sleep: Negative; No snoring, daytime sleepiness, hypersomnolence, bruxism, restless legs, hypnogognic hallucinations, no cataplexy Other comprehensive 14 point system review is negative.   PE BP 124/50  Pulse 68  Ht 6' 1.5" (1.867 m)  Wt 245 lb 8 oz (111.358 kg)  BMI 31.95 kg/m2  Repeat blood pressure by me was 130/70 supine and in the standing position his blood pressure is 112/68. General: Alert, oriented, no distress.  Skin: normal turgor, no rashes HEENT: Normocephalic, atraumatic. Pupils round and reactive; sclera anicteric;no lid lag.  Nose without nasal septal hypertrophy Mouth/Parynx benign; Mallinpatti scale 2 Neck: No JVD, no carotid bruits with normal carotid upstrokes Lungs: clear to ausculatation and percussion; no wheezing or rales  Chest wall: No tenderness to palpation Heart: PMI not displaced; regular rate and rhythm. s1 s2 normal  1-2/6 systolic murmur ; no S3. No rub. Abdomen: soft, nontender; no hepatosplenomehaly, BS+; abdominal aorta nontender and not dilated by palpation. Back: No CVA tenderness Pulses 2+ Extremities: no clubbing cyanosis or edema, Homan's sign negative  Neurologic: grossly nonfocal; cranial nerves grossly intact Psychological: Normal affect and mood  ECG (independently read by me): Normal sinus rhythm at 68 beats per minute with first degree AV block with a PR interval at 284 ms.  QTc interval 435 ms.  No significant ST-T changes.  T-wave inversion in aVL  ECG today (independently read by me):  Normal sinus rhythm at 71 beats per minute. First create a block with PR interval at 240 ms. QTc  interval normal at 4-5 ms.  Prior 10/27/13 ECG (independently read by me): Atrial flutter with variable rate but predominantly 4-1 with occasional 3-1 block; average heart rate 70 beats per minute  Prior ECG from 06/04/2013: Sinus rhythm with first-degree AV block. Nonspecific interventricular block  LABS:  BMET    Component Value Date/Time   NA 137 03/07/2014 0904   K 4.4 03/07/2014 0904   CL 103 03/07/2014 0904   CO2 25 03/07/2014 0904   GLUCOSE 105* 03/07/2014 0904   BUN 17 03/07/2014 0904   CREATININE 1.08 03/07/2014 0904   CREATININE 0.99 01/17/2013 0420   CALCIUM 9.5 03/07/2014 0904   GFRNONAA 76* 01/17/2013 0420   GFRAA 89* 01/17/2013 0420     Hepatic Function Panel     Component Value Date/Time   PROT 6.8 03/07/2014 0904   ALBUMIN 4.2 03/07/2014 0904   AST 18 03/07/2014 0904   ALT 16 03/07/2014 0904   ALKPHOS 59 03/07/2014 0904   BILITOT 0.7 03/07/2014 0904     CBC    Component Value Date/Time   WBC 7.7 03/07/2014 0901   RBC 4.66 03/07/2014 0901   HGB 12.6* 03/07/2014 0901   HCT 38.2* 03/07/2014 0901   PLT 279 03/07/2014 0901   MCV 82.0 03/07/2014 0901   MCH 27.0 03/07/2014 0901   MCHC 33.0 03/07/2014 0901   RDW 16.1* 03/07/2014 0901   LYMPHSABS 1.9 03/07/2014 0901   MONOABS 0.7 03/07/2014 0901   EOSABS 0.2 03/07/2014 0901   BASOSABS 0.1 03/07/2014 0901     BNP No results found for this basename: probnp    Lipid Panel     Component Value Date/Time   TRIG 134 06/14/2013 0922   LDLCALC 82 06/14/2013 0922     RADIOLOGY: No results found.    ASSESSMENT AND PLAN:  Derek Wells  is now 14 months months status post emergency CABG revascularization surgery after cardiac catheterization demonstrated severe life threatening coronary anatomy.  He has noticed a vague left chest sensation associated with mild shortness of breath.  This typically does not occur while he is at cardiac rehabilitation.  He  is in the maintenance phase of cardiac rehabilitation. He  developed atrial  flutter in January and was started on anticoagulation with Xarelto. He is now status post successful DC cardioversion and he is maintaining normal sinus rhythm with first degree AV block.  I have recommended continuation of anticoagulation, particularly with the age greater than 11 years old, hypertension, diabetes, and peripheral vascular disease.  His last echo Doppler study was in September 2013.  Several months, I am recommending he undergo a two-year followup echo Doppler study and will also undergo an exercise Myoview study since  he has at times noticed a vague sensation in his chest with some shortness of breath, particularly when he rolls his garbage cans up a hill.  He will hold his Toprol the morning before and  the morning of his exercise Myoview study.  I will see him back in the office in followup and further recommendations will be made at that time.     Lennette Bihari, MD, [facility]  03/08/2014 11:34 AM

## 2014-03-09 ENCOUNTER — Encounter (HOSPITAL_COMMUNITY)
Admission: RE | Admit: 2014-03-09 | Discharge: 2014-03-09 | Disposition: A | Discharge status: Home / Self Care | Payer: Self-pay | Source: Ambulatory Visit | Attending: Cardiovascular Disease | Admitting: Cardiovascular Disease

## 2014-03-09 LAB — PSA: PSA: 1.88 ng/mL (ref <=4.00)

## 2014-03-11 ENCOUNTER — Encounter (HOSPITAL_COMMUNITY): Payer: Self-pay

## 2014-03-14 ENCOUNTER — Encounter (HOSPITAL_COMMUNITY): Payer: Self-pay

## 2014-03-16 ENCOUNTER — Telehealth (HOSPITAL_COMMUNITY): Payer: Self-pay

## 2014-03-16 ENCOUNTER — Encounter (HOSPITAL_COMMUNITY): Payer: Medicare Other

## 2014-03-16 ENCOUNTER — Encounter (HOSPITAL_COMMUNITY): Payer: Self-pay

## 2014-03-17 ENCOUNTER — Telehealth (HOSPITAL_COMMUNITY): Payer: Self-pay

## 2014-03-18 ENCOUNTER — Encounter (HOSPITAL_COMMUNITY): Payer: Self-pay

## 2014-03-18 ENCOUNTER — Ambulatory Visit (HOSPITAL_COMMUNITY)
Admission: RE | Admit: 2014-03-18 | Discharge: 2014-03-18 | Disposition: A | Discharge status: Home / Self Care | Payer: Medicare Other | Source: Ambulatory Visit | Attending: Cardiovascular Disease | Admitting: Cardiovascular Disease

## 2014-03-18 ENCOUNTER — Ambulatory Visit (HOSPITAL_BASED_OUTPATIENT_CLINIC_OR_DEPARTMENT_OTHER)
Admission: RE | Admit: 2014-03-18 | Discharge: 2014-03-18 | Disposition: A | Discharge status: Home / Self Care | Payer: Medicare Other | Source: Ambulatory Visit | Attending: Cardiovascular Disease | Admitting: Cardiovascular Disease

## 2014-03-18 VITALS — Ht 74.0 in | Wt 242.0 lb

## 2014-03-18 DIAGNOSIS — R0989 Other specified symptoms and signs involving the circulatory and respiratory systems: Secondary | ICD-10-CM | POA: Insufficient documentation

## 2014-03-18 DIAGNOSIS — R9431 Abnormal electrocardiogram [ECG] [EKG]: Secondary | ICD-10-CM | POA: Insufficient documentation

## 2014-03-18 DIAGNOSIS — Z951 Presence of aortocoronary bypass graft: Secondary | ICD-10-CM | POA: Insufficient documentation

## 2014-03-18 DIAGNOSIS — I252 Old myocardial infarction: Secondary | ICD-10-CM | POA: Insufficient documentation

## 2014-03-18 DIAGNOSIS — Z87891 Personal history of nicotine dependence: Secondary | ICD-10-CM | POA: Insufficient documentation

## 2014-03-18 DIAGNOSIS — R0602 Shortness of breath: Secondary | ICD-10-CM | POA: Insufficient documentation

## 2014-03-18 DIAGNOSIS — I10 Essential (primary) hypertension: Secondary | ICD-10-CM | POA: Insufficient documentation

## 2014-03-18 DIAGNOSIS — R0609 Other forms of dyspnea: Secondary | ICD-10-CM | POA: Insufficient documentation

## 2014-03-18 DIAGNOSIS — Z8249 Family history of ischemic heart disease and other diseases of the circulatory system: Secondary | ICD-10-CM | POA: Insufficient documentation

## 2014-03-18 DIAGNOSIS — I25119 Atherosclerotic heart disease of native coronary artery with unspecified angina pectoris: Secondary | ICD-10-CM

## 2014-03-18 DIAGNOSIS — I2510 Atherosclerotic heart disease of native coronary artery without angina pectoris: Secondary | ICD-10-CM | POA: Insufficient documentation

## 2014-03-18 DIAGNOSIS — R079 Chest pain, unspecified: Secondary | ICD-10-CM | POA: Insufficient documentation

## 2014-03-18 DIAGNOSIS — I4892 Unspecified atrial flutter: Secondary | ICD-10-CM

## 2014-03-18 DIAGNOSIS — I739 Peripheral vascular disease, unspecified: Secondary | ICD-10-CM | POA: Insufficient documentation

## 2014-03-18 MED ORDER — TECHNETIUM TC 99M SESTAMIBI GENERIC - CARDIOLITE
31.2000 | Freq: Once | INTRAVENOUS | Status: AC | PRN
  Administered 2014-03-18: 31.2 via INTRAVENOUS

## 2014-03-18 MED ORDER — TECHNETIUM TC 99M SESTAMIBI GENERIC - CARDIOLITE
10.6000 | Freq: Once | INTRAVENOUS | Status: AC | PRN
  Administered 2014-03-18: 11 via INTRAVENOUS

## 2014-03-18 NOTE — Procedures (Addendum)
[facility] [address] [place] [address] [phone]  Cardiology Nuclear Med Study  Lorelei PontJames A Dorce is a 80 y.o. male     MRN : [id number]     DOB: Jul 02, 1933  Procedure Date: 03/18/2014  Nuclear Med Background Indication for Stress Test:  Evaluation for Ischemia and Abnormal EKG History:  CAD;MI;CABG X3-01/15/2013;a-flutter;angina;cardioversion 11/12/2013;Last NUC MPI on 06/17/2012;EF=66%;ECHO-06/16/2012-EF=50-55% Cardiac Risk Factors: Family History - CAD, History of Smoking, Hypertension, Lipids, Overweight and PVD  Symptoms:  Chest Pain, DOE, Fatigue and SOB   Nuclear Pre-Procedure Caffeine/Decaff Intake:  8:00pm NPO After: 6:00am   IV Site: R Forearm  IV 0.9% NS with Angio Cath:  22g  Chest Size (in):  52"  IV Started by: Berdie OgrenAmanda Wease, RN  Height: 6\' 2"  (1.88 m)  Cup Size: n/a  BMI:  Body mass index is 31.06 kg/(m^2). Weight:  242 lb (109.77 kg)   Tech Comments:  n/a    Nuclear Med Study 1 or 2 day study: 1 day  Stress Test Type:  Stress  Order Authorizing Hether Anselmo:  Nicki Guadalajarahomas Kelly, MD   Resting Radionuclide: Technetium 3651m Sestamibi  Resting Radionuclide Dose: 10.6 mCi   Stress Radionuclide:  Technetium 5551m Sestamibi  Stress Radionuclide Dose: 31.2 mCi           Stress Protocol Rest HR: 66 Stress HR: 130  Rest BP: 153/67 Stress BP: 139/96  Exercise Time (min): 4 METS: 5.8   Predicted Max HR: 141 bpm % Max HR: 92.2 bpm Rate Pressure Product: [id number]  Dose of Adenosine (mg):  n/a Dose of Lexiscan: n/a mg  Dose of Atropine (mg): n/a Dose of Dobutamine: n/a mcg/kg/min (at max HR)  Stress Test Technologist: Esperanza Sheetserry-Marie Martin, CCT Nuclear Technologist: Gonzella LexPam Phillips, CNMT   Rest Procedure:  Myocardial perfusion imaging was performed at rest 45 minutes following the intravenous administration of Technetium 8051m Sestamibi. Stress Procedure:  The patient performed treadmill exercise using a Bruce  Protocol for 4  minutes. The patient stopped due to Marked SOB and chest tightness and denied any chest pain.  There were no significant ST-T wave changes.  Technetium 1751m Sestamibi was injected IV at peak exercise and myocardial perfusion imaging was performed after a brief delay.  Transient Ischemic Dilatation (Normal <1.22):  1.14  QGS EDV:  106 ml QGS ESV:  37 ml LV Ejection Fraction: 65%    Rest ECG: NSR; Q in aVR and aVL  Stress ECG: Ventricular couplet and developed transient LBBB rhythm with stress  QPS Raw Data Images:  Normal; no motion artifact; normal heart/lung ratio. Stress Images:  Normal homogeneous uptake in all areas of the myocardium. Rest Images:  Mild inferior wall attenuation Subtraction (SDS):  No evidence of ischemia.  Impression Exercise Capacity:  Reduced exercise capacity with workload of only 5.8 mets BP Response:  Hypotensive blood pressure response to `139 at peak stress from 153 prior to the study. Clinical Symptoms:  Mild chest pain/dyspnea. ECG Impression:  ECG changes uninterpretable with LBBB during exercise; no significant ST depression when LBBB resolved. Comparison with Prior Nuclear Study: No images to compare  Overall Impression:  Low risk stress nuclear study demonstrating reduced aerobic capacity with exersice indeuced ectopy and LBBB without scintigraphic evidence for ischemia..  LV Wall Motion:  NL LV Function, EF 65%; NL Wall Motion   KELLY,THOMAS A, MD  03/18/2014 11:34 AM

## 2014-03-18 NOTE — Progress Notes (Signed)
2D Echocardiogram Complete.  03/18/2014   Bethany McMahill, RDCS  

## 2014-03-21 ENCOUNTER — Encounter (HOSPITAL_COMMUNITY)
Admission: RE | Admit: 2014-03-21 | Discharge: 2014-03-21 | Disposition: A | Discharge status: Home / Self Care | Payer: Self-pay | Source: Ambulatory Visit | Attending: Cardiovascular Disease | Admitting: Cardiovascular Disease

## 2014-03-23 ENCOUNTER — Encounter (HOSPITAL_COMMUNITY): Payer: Self-pay

## 2014-03-23 ENCOUNTER — Telehealth: Payer: Self-pay | Admitting: Cardiovascular Disease

## 2014-03-23 DIAGNOSIS — I2510 Atherosclerotic heart disease of native coronary artery without angina pectoris: Secondary | ICD-10-CM | POA: Insufficient documentation

## 2014-03-23 DIAGNOSIS — I10 Essential (primary) hypertension: Secondary | ICD-10-CM | POA: Insufficient documentation

## 2014-03-23 DIAGNOSIS — I200 Unstable angina: Secondary | ICD-10-CM | POA: Insufficient documentation

## 2014-03-23 DIAGNOSIS — Z951 Presence of aortocoronary bypass graft: Secondary | ICD-10-CM | POA: Insufficient documentation

## 2014-03-23 DIAGNOSIS — Z9889 Other specified postprocedural states: Secondary | ICD-10-CM | POA: Insufficient documentation

## 2014-03-23 DIAGNOSIS — E785 Hyperlipidemia, unspecified: Secondary | ICD-10-CM | POA: Insufficient documentation

## 2014-03-23 DIAGNOSIS — Z5189 Encounter for other specified aftercare: Secondary | ICD-10-CM | POA: Insufficient documentation

## 2014-03-23 DIAGNOSIS — I4892 Unspecified atrial flutter: Secondary | ICD-10-CM | POA: Insufficient documentation

## 2014-03-23 NOTE — Telephone Encounter (Signed)
Pt would like his Stress and echo test results from 03-18-14. Pt would also like his last lab work results.

## 2014-03-23 NOTE — Telephone Encounter (Signed)
[facility] Tresa EndoKelly to advise on labs/echo/stress test.

## 2014-03-24 NOTE — Telephone Encounter (Signed)
Encounter complete. 

## 2014-03-28 ENCOUNTER — Encounter (HOSPITAL_COMMUNITY)
Admission: RE | Admit: 2014-03-28 | Discharge: 2014-03-28 | Disposition: A | Discharge status: Home / Self Care | Payer: Self-pay | Source: Ambulatory Visit | Attending: Cardiovascular Disease | Admitting: Cardiovascular Disease

## 2014-03-29 NOTE — Telephone Encounter (Signed)
Info reviewed last week when notified

## 2014-03-30 ENCOUNTER — Encounter (HOSPITAL_COMMUNITY): Payer: Self-pay

## 2014-04-01 ENCOUNTER — Encounter (HOSPITAL_COMMUNITY)
Admission: RE | Admit: 2014-04-01 | Discharge: 2014-04-01 | Disposition: A | Discharge status: Home / Self Care | Payer: Self-pay | Source: Ambulatory Visit | Attending: Cardiovascular Disease | Admitting: Cardiovascular Disease

## 2014-04-04 ENCOUNTER — Encounter (HOSPITAL_COMMUNITY)
Admission: RE | Admit: 2014-04-04 | Discharge: 2014-04-04 | Disposition: A | Discharge status: Home / Self Care | Payer: Self-pay | Source: Ambulatory Visit | Attending: Cardiovascular Disease | Admitting: Cardiovascular Disease

## 2014-04-06 ENCOUNTER — Encounter (HOSPITAL_COMMUNITY)
Admission: RE | Admit: 2014-04-06 | Discharge: 2014-04-06 | Disposition: A | Discharge status: Home / Self Care | Payer: Self-pay | Source: Ambulatory Visit | Attending: Cardiovascular Disease | Admitting: Cardiovascular Disease

## 2014-04-06 NOTE — Telephone Encounter (Signed)
Encounter complete. 

## 2014-04-08 ENCOUNTER — Encounter (HOSPITAL_COMMUNITY): Payer: Self-pay

## 2014-04-11 ENCOUNTER — Encounter (HOSPITAL_COMMUNITY)
Admission: RE | Admit: 2014-04-11 | Discharge: 2014-04-11 | Disposition: A | Discharge status: Home / Self Care | Payer: Self-pay | Source: Ambulatory Visit | Attending: Cardiovascular Disease | Admitting: Cardiovascular Disease

## 2014-04-13 ENCOUNTER — Telehealth: Payer: Self-pay | Admitting: Cardiovascular Disease

## 2014-04-13 ENCOUNTER — Encounter (HOSPITAL_COMMUNITY)
Admission: RE | Admit: 2014-04-13 | Discharge: 2014-04-13 | Disposition: A | Discharge status: Home / Self Care | Payer: Self-pay | Source: Ambulatory Visit | Attending: Cardiovascular Disease | Admitting: Cardiovascular Disease

## 2014-04-13 NOTE — Telephone Encounter (Signed)
Dr. Tresa EndoKelly this pt. Would like for you to call him with his test results;  he read the results on my chart and could not understand them

## 2014-04-13 NOTE — Telephone Encounter (Signed)
Would like to have his test results explained to him in which he can understand better. Stating that he has not heard anything back as of yet . Please call   Thanks

## 2014-04-15 ENCOUNTER — Encounter (HOSPITAL_COMMUNITY)
Admission: RE | Admit: 2014-04-15 | Discharge: 2014-04-15 | Disposition: A | Discharge status: Home / Self Care | Payer: Self-pay | Source: Ambulatory Visit | Attending: Cardiovascular Disease | Admitting: Cardiovascular Disease

## 2014-04-18 ENCOUNTER — Encounter (HOSPITAL_COMMUNITY)
Admission: RE | Admit: 2014-04-18 | Discharge: 2014-04-18 | Disposition: A | Discharge status: Home / Self Care | Payer: Self-pay | Source: Ambulatory Visit | Attending: Cardiovascular Disease | Admitting: Cardiovascular Disease

## 2014-04-20 ENCOUNTER — Encounter (HOSPITAL_COMMUNITY)
Admission: RE | Admit: 2014-04-20 | Discharge: 2014-04-20 | Disposition: A | Discharge status: Home / Self Care | Payer: Self-pay | Source: Ambulatory Visit | Attending: Cardiovascular Disease | Admitting: Cardiovascular Disease

## 2014-04-22 ENCOUNTER — Encounter (HOSPITAL_COMMUNITY)
Admission: RE | Admit: 2014-04-22 | Discharge: 2014-04-22 | Disposition: A | Discharge status: Home / Self Care | Payer: Self-pay | Source: Ambulatory Visit | Attending: Cardiovascular Disease | Admitting: Cardiovascular Disease

## 2014-04-25 ENCOUNTER — Encounter (HOSPITAL_COMMUNITY): Payer: Medicare Other

## 2014-04-25 DIAGNOSIS — Z5189 Encounter for other specified aftercare: Secondary | ICD-10-CM | POA: Insufficient documentation

## 2014-04-25 DIAGNOSIS — I10 Essential (primary) hypertension: Secondary | ICD-10-CM | POA: Insufficient documentation

## 2014-04-25 DIAGNOSIS — I4892 Unspecified atrial flutter: Secondary | ICD-10-CM | POA: Insufficient documentation

## 2014-04-25 DIAGNOSIS — E785 Hyperlipidemia, unspecified: Secondary | ICD-10-CM | POA: Insufficient documentation

## 2014-04-25 DIAGNOSIS — Z9889 Other specified postprocedural states: Secondary | ICD-10-CM | POA: Insufficient documentation

## 2014-04-25 DIAGNOSIS — Z951 Presence of aortocoronary bypass graft: Secondary | ICD-10-CM | POA: Insufficient documentation

## 2014-04-25 DIAGNOSIS — I200 Unstable angina: Secondary | ICD-10-CM | POA: Insufficient documentation

## 2014-04-25 DIAGNOSIS — I2510 Atherosclerotic heart disease of native coronary artery without angina pectoris: Secondary | ICD-10-CM | POA: Insufficient documentation

## 2014-04-27 ENCOUNTER — Telehealth: Payer: Self-pay | Admitting: Cardiovascular Disease

## 2014-04-27 ENCOUNTER — Encounter (HOSPITAL_COMMUNITY): Payer: Self-pay

## 2014-04-27 NOTE — Telephone Encounter (Signed)
Mr.Milone is calling about his test results .Marland Kitchen. Stating that he has not hear anything as of yet .Marland Kitchen. Thanks

## 2014-04-29 ENCOUNTER — Encounter (HOSPITAL_COMMUNITY): Payer: Self-pay

## 2014-05-02 ENCOUNTER — Encounter (HOSPITAL_COMMUNITY)
Admission: RE | Admit: 2014-05-02 | Discharge: 2014-05-02 | Disposition: A | Discharge status: Home / Self Care | Payer: Self-pay | Source: Ambulatory Visit | Attending: Cardiovascular Disease | Admitting: Cardiovascular Disease

## 2014-05-04 ENCOUNTER — Encounter (HOSPITAL_COMMUNITY)
Admission: RE | Admit: 2014-05-04 | Discharge: 2014-05-04 | Disposition: A | Discharge status: Home / Self Care | Payer: Self-pay | Source: Ambulatory Visit | Attending: Cardiovascular Disease | Admitting: Cardiovascular Disease

## 2014-05-04 NOTE — Telephone Encounter (Signed)
This message was sent to Dr. Tresa EndoKelly to call the patient and discuss.

## 2014-05-06 ENCOUNTER — Encounter (HOSPITAL_COMMUNITY)
Admission: RE | Admit: 2014-05-06 | Discharge: 2014-05-06 | Disposition: A | Discharge status: Home / Self Care | Payer: Self-pay | Source: Ambulatory Visit | Attending: Cardiovascular Disease | Admitting: Cardiovascular Disease

## 2014-05-09 ENCOUNTER — Encounter (HOSPITAL_COMMUNITY)
Admission: RE | Admit: 2014-05-09 | Discharge: 2014-05-09 | Disposition: A | Discharge status: Home / Self Care | Payer: Self-pay | Source: Ambulatory Visit | Attending: Cardiovascular Disease | Admitting: Cardiovascular Disease

## 2014-05-11 ENCOUNTER — Encounter (HOSPITAL_COMMUNITY): Payer: Self-pay

## 2014-05-13 ENCOUNTER — Encounter (HOSPITAL_COMMUNITY): Payer: Self-pay

## 2014-05-16 ENCOUNTER — Encounter (HOSPITAL_COMMUNITY): Payer: Self-pay

## 2014-05-18 ENCOUNTER — Encounter (HOSPITAL_COMMUNITY): Payer: Self-pay

## 2014-05-20 ENCOUNTER — Encounter (HOSPITAL_COMMUNITY): Payer: Self-pay

## 2014-05-23 ENCOUNTER — Encounter (HOSPITAL_COMMUNITY): Payer: Self-pay

## 2014-05-25 ENCOUNTER — Encounter (HOSPITAL_COMMUNITY): Payer: Medicare Other

## 2014-05-25 DIAGNOSIS — Z9889 Other specified postprocedural states: Secondary | ICD-10-CM | POA: Insufficient documentation

## 2014-05-25 DIAGNOSIS — I200 Unstable angina: Secondary | ICD-10-CM | POA: Insufficient documentation

## 2014-05-25 DIAGNOSIS — Z5189 Encounter for other specified aftercare: Secondary | ICD-10-CM | POA: Insufficient documentation

## 2014-05-25 DIAGNOSIS — I2510 Atherosclerotic heart disease of native coronary artery without angina pectoris: Secondary | ICD-10-CM | POA: Insufficient documentation

## 2014-05-25 DIAGNOSIS — I4892 Unspecified atrial flutter: Secondary | ICD-10-CM | POA: Insufficient documentation

## 2014-05-25 DIAGNOSIS — E785 Hyperlipidemia, unspecified: Secondary | ICD-10-CM | POA: Insufficient documentation

## 2014-05-25 DIAGNOSIS — I10 Essential (primary) hypertension: Secondary | ICD-10-CM | POA: Insufficient documentation

## 2014-05-25 DIAGNOSIS — Z951 Presence of aortocoronary bypass graft: Secondary | ICD-10-CM | POA: Insufficient documentation

## 2014-05-27 ENCOUNTER — Encounter (HOSPITAL_COMMUNITY): Payer: Self-pay

## 2014-06-01 ENCOUNTER — Encounter (HOSPITAL_COMMUNITY): Admission: RE | Admit: 2014-06-01 | Payer: Self-pay | Source: Ambulatory Visit

## 2014-06-02 ENCOUNTER — Ambulatory Visit: Payer: Medicare Other | Admitting: Cardiovascular Disease

## 2014-06-03 ENCOUNTER — Encounter (HOSPITAL_COMMUNITY): Payer: Self-pay

## 2014-06-06 ENCOUNTER — Encounter (HOSPITAL_COMMUNITY): Payer: Self-pay

## 2014-06-08 ENCOUNTER — Encounter (HOSPITAL_COMMUNITY)
Admission: RE | Admit: 2014-06-08 | Discharge: 2014-06-08 | Disposition: A | Discharge status: Home / Self Care | Payer: Self-pay | Source: Ambulatory Visit | Attending: Cardiovascular Disease | Admitting: Cardiovascular Disease

## 2014-06-10 ENCOUNTER — Encounter (HOSPITAL_COMMUNITY)
Admission: RE | Admit: 2014-06-10 | Discharge: 2014-06-10 | Disposition: A | Discharge status: Home / Self Care | Payer: Self-pay | Source: Ambulatory Visit | Attending: Cardiovascular Disease | Admitting: Cardiovascular Disease

## 2014-06-13 ENCOUNTER — Encounter (HOSPITAL_COMMUNITY)
Admission: RE | Admit: 2014-06-13 | Discharge: 2014-06-13 | Disposition: A | Discharge status: Home / Self Care | Payer: Self-pay | Source: Ambulatory Visit | Attending: Cardiovascular Disease | Admitting: Cardiovascular Disease

## 2014-06-14 ENCOUNTER — Telehealth: Payer: Self-pay | Admitting: *Deleted

## 2014-06-14 NOTE — Telephone Encounter (Signed)
Faxed cardiac rehab 6 mo order to [facility].

## 2014-06-15 ENCOUNTER — Encounter (HOSPITAL_COMMUNITY): Payer: Self-pay

## 2014-06-17 ENCOUNTER — Encounter (HOSPITAL_COMMUNITY): Payer: Self-pay

## 2014-06-20 ENCOUNTER — Encounter (HOSPITAL_COMMUNITY)
Admission: RE | Admit: 2014-06-20 | Discharge: 2014-06-20 | Disposition: A | Discharge status: Home / Self Care | Payer: Self-pay | Source: Ambulatory Visit | Attending: Cardiovascular Disease | Admitting: Cardiovascular Disease

## 2014-06-21 ENCOUNTER — Ambulatory Visit: Payer: Medicare Other | Admitting: Cardiovascular Disease

## 2014-06-22 ENCOUNTER — Encounter (HOSPITAL_COMMUNITY)
Admission: RE | Admit: 2014-06-22 | Discharge: 2014-06-22 | Disposition: A | Discharge status: Home / Self Care | Payer: Self-pay | Source: Ambulatory Visit | Attending: Cardiovascular Disease | Admitting: Cardiovascular Disease

## 2014-06-23 ENCOUNTER — Ambulatory Visit (INDEPENDENT_AMBULATORY_CARE_PROVIDER_SITE_OTHER): Payer: Medicare Other | Admitting: Cardiovascular Disease

## 2014-06-23 VITALS — BP 138/72 | HR 67 | Ht 73.5 in | Wt 251.3 lb

## 2014-06-23 DIAGNOSIS — Z8679 Personal history of other diseases of the circulatory system: Secondary | ICD-10-CM

## 2014-06-23 DIAGNOSIS — Z9889 Other specified postprocedural states: Secondary | ICD-10-CM

## 2014-06-23 DIAGNOSIS — R0609 Other forms of dyspnea: Secondary | ICD-10-CM

## 2014-06-23 DIAGNOSIS — I10 Essential (primary) hypertension: Secondary | ICD-10-CM

## 2014-06-23 DIAGNOSIS — Z951 Presence of aortocoronary bypass graft: Secondary | ICD-10-CM

## 2014-06-23 DIAGNOSIS — R0600 Dyspnea, unspecified: Secondary | ICD-10-CM

## 2014-06-23 DIAGNOSIS — I2510 Atherosclerotic heart disease of native coronary artery without angina pectoris: Secondary | ICD-10-CM

## 2014-06-23 DIAGNOSIS — Z7901 Long term (current) use of anticoagulants: Secondary | ICD-10-CM

## 2014-06-23 DIAGNOSIS — E785 Hyperlipidemia, unspecified: Secondary | ICD-10-CM

## 2014-06-23 MED ORDER — ATORVASTATIN CALCIUM 20 MG PO TABS: 20.0000 mg | ORAL_TABLET | Freq: Every day | ORAL | Status: DC

## 2014-06-23 MED ORDER — RIVAROXABAN 20 MG PO TABS: 20.0000 mg | ORAL_TABLET | Freq: Every day | ORAL | Status: DC

## 2014-06-23 MED ORDER — METOPROLOL SUCCINATE ER 25 MG PO TB24: 25.0000 mg | ORAL_TABLET | Freq: Every day | ORAL | Status: DC

## 2014-06-23 MED ORDER — VALSARTAN 160 MG PO TABS: 160.0000 mg | ORAL_TABLET | Freq: Every day | ORAL | Status: DC

## 2014-06-23 NOTE — Patient Instructions (Signed)
Your physician wants you to follow-up in: 6 months or sooner if needed. You will receive a reminder letter in the mail two months in advance. If you don't receive a letter, please call our office to schedule the follow-up appointment.    Increase the Coq10 to 300 mg daily.

## 2014-06-24 ENCOUNTER — Encounter (HOSPITAL_COMMUNITY): Payer: Self-pay

## 2014-06-24 DIAGNOSIS — I2510 Atherosclerotic heart disease of native coronary artery without angina pectoris: Secondary | ICD-10-CM | POA: Insufficient documentation

## 2014-06-24 DIAGNOSIS — Z951 Presence of aortocoronary bypass graft: Secondary | ICD-10-CM | POA: Insufficient documentation

## 2014-06-24 DIAGNOSIS — Z880 Allergy status to penicillin: Secondary | ICD-10-CM | POA: Insufficient documentation

## 2014-06-24 DIAGNOSIS — I10 Essential (primary) hypertension: Secondary | ICD-10-CM | POA: Insufficient documentation

## 2014-06-24 DIAGNOSIS — E785 Hyperlipidemia, unspecified: Secondary | ICD-10-CM | POA: Insufficient documentation

## 2014-06-24 DIAGNOSIS — Z79899 Other long term (current) drug therapy: Secondary | ICD-10-CM | POA: Insufficient documentation

## 2014-06-25 ENCOUNTER — Encounter: Payer: Self-pay | Admitting: Cardiovascular Disease

## 2014-06-25 NOTE — Progress Notes (Signed)
Patient ID: Derek Wells, male   DOB: June 29, 1933, 80 y.o.   MRN: [id number]      HPI: Derek Wells is a 80 y.o. male who resents to the office today for 4 month followup cardiology evaluation   Derek Wells has a history of hypertension, hyperlipidemia, and is s/p abdominal aortic aneurysm repair in April 2010. I had seen him on 01/14/2013 at which time he had experienced symptoms worrisome for unstable angina. In September 2013 he previously had undergone a nuclear perfusion study  which was essentially normal although did show mild diaphragmatic attenuation. He also been found to have a atherogenic dyslipidemia pattern with reference to his lipid panel. Catheterization the following day on 01/15/2013 revealed life-threatening coronary anatomy with 60-70% ostial left main stenosis, 90-95% distal left main stenosis, 95-99% ostial LAD stenosis, and mild/moderate stenoses in the RCA. That same day he was successfully operated by Dr. Rexanne Mano and underwent CABG x3 with a LIMA to the LAD, SVG to the OM, and SVG to the PDA. He had a unremarkable postoperative course and was discharged 4 days later.  He saw Derek Wells on 01/27/13 and was doing well. I saw him in followup on 03/02/2013 and he  participating  in cardiac rehabilitation. When I saw him in September 2014, he was in sinus rhythm and has and had resumed a good level of activity.   In January 2015, he was found to be in atrial flutter at cardiac rehabilitation. He was unaware of his heart rhythm being abnormal. He had developed a URI over the holidays prior to that and had just completed a steroid dose pack. Xarelto 20 mg was added to his medical regimen. He did undergo subsequent blood work on January 20 which showed normal renal function and normal electrolytes. Liver function studies were normal. Hemoglobin was 13.7 hematocrit 41.7. Platelets were normal. TSH was normal at 2.63. Magnesium was normal at 2.0.   He underwent successful cardioversion for  his atrial flutter on 11/12/2013 and he cardioverted to sinus rhythm at 100 J.  Since his cardioversion, he is unaware of any recurrent rhythm disturbance.  He did go to [place] in March after his cardioversion.  He has been playing golf approximately one to 2 times per week.  He is in the maintenance phase of cardiac rehabilitation.  When I last saw him in June, he told me that he had noticed several episodes of a vague chest sensation particularly when he rolls his garbage can back up a hill at home.    He underwent a nuclear perfusion study, which showed reduced exercise capacity with a workload of only 5.9.  He did experience mild shortness of breath.  There was no significant ST depression, but he had developed transient left bundle branch block.  The scan was interpreted at low as low risk.  Ejection fraction was 65% without wall motion abnormalities. An echo Doppler study showed an EF of 60-65%.  There were no regional wall motion abnormalities.  He had mild biatrial enlargement.  Derek Wells currently is playing golf at least 2 times per week and occasionally 3.  He is in the maintenance phase of cardiac rehabilitation.  He does admit to some weight gain.  He denies any chest tightness.  Past Medical History  Diagnosis Date  . Hypertension   . Coronary artery disease   . CAD (coronary artery disease), severe needing CABG 01/19/2013  . S/P CABG x 3 01/19/2013  . HTN (hypertension) 01/19/2013  .  Hyperlipidemia LDL goal < 70 01/19/2013  . Chest pain     2D ECHO, 06/16/2012 - EF 50-55%, borderline low left ventricular systolic function  . Dyspnea on exertion     LEXISCAN, 06/17/2012 - fixed inferior bowel artifact and basal septal defect, probably related to IVCD/incomplete LBBB, no reversible ischemia, post-stress EF 66%  . History of AAA (abdominal aortic aneurysm) repair     ABDOMINAL AORTIC DOPPLER, 06/16/2013 - normal    Past Surgical History  Procedure Laterality Date  . Vascular surgery     . Coronary artery bypass graft N/A 01/15/2013    Procedure: Coronary Artery Bypass Graft times three using Right Greater Saphenous Vein Graft harvested endoscopically and Left Internal Mammary Artery and;  Surgeon: Alleen BorneBryan K Bartle, MD;  Location: [facility] OR;  Service: Open Heart Surgery;  Laterality: N/A;  . Intraoperative transesophageal echocardiogram  01/15/2013    Procedure: INTRAOPERATIVE TRANSESOPHAGEAL ECHOCARDIOGRAM;  Surgeon: Alleen BorneBryan K Bartle, MD;  Location: [facility] OR;  Service: Open Heart Surgery;;  . Cardiac catheterization  01/15/2013    Severe coronary obstructive disease with 60-70% ostial left main and 90-85% distal LAD stenosis and mild-moderate stenoses in RCA, recommended for CABG  . Cardioversion N/A 11/12/2013    Procedure: CARDIOVERSION;  Surgeon: Lennette Biharihomas A Toben Acuna, MD;  [facility];  Service: Cardiovascular;  Laterality: N/A;  11:16 Lido 60mg , Propofol 120mg ,IV 11:19 synch 100 joules conversion conversion to NSR....    Allergies  Allergen Reactions  . Penicillins Rash    Current Outpatient Prescriptions  Medication Sig Dispense Refill  . aspirin 81 MG tablet Take 81 mg by mouth daily.      Marland Kitchen. atorvastatin (LIPITOR) 20 MG tablet Take 1 tablet (20 mg total) by mouth daily.  90 tablet  3  . Coenzyme Q10 Liposomal 100 MG/ML LIQD Take 100 mg by mouth daily.      Marland Kitchen. ezetimibe (ZETIA) 10 MG tablet Take 1 tablet (10 mg total) by mouth daily.  30 tablet  6  . fish oil-omega-3 fatty acids 1000 MG capsule Take 1 g by mouth daily.      . metFORMIN (GLUCOPHAGE) 500 MG tablet Take 500 mg by mouth daily.      . metoprolol succinate (TOPROL-XL) 25 MG 24 hr tablet Take 1 tablet (25 mg total) by mouth daily.  90 tablet  3  . Multiple Vitamin (MULTIVITAMIN WITH MINERALS) TABS Take 1 tablet by mouth daily.      . rivaroxaban (XARELTO) 20 MG TABS tablet Take 1 tablet (20 mg total) by mouth daily with supper.  30 tablet  11  . valsartan (DIOVAN) 160 MG tablet Take 1 tablet (160 mg total) by mouth  daily.  90 tablet  3   No current facility-administered medications for this visit.    Socially he is re-married. His first wife of 42 years passed away. He previously had smoked cigars prior to his bypass surgery but has not had any tobacco use since.  ROS General: Negative; No fevers, chills, or night sweats;  HEENT: Negative; No changes in vision or hearing, sinus congestion, difficulty swallowing Pulmonary: URI symptoms during the winter; No cough, wheezing, shortness of breath, hemoptysis Cardiovascular: Mild shortness of breath with uphill walking; see history of present illness GI: Negative; No nausea, vomiting, diarrhea, or abdominal pain GU: Negative; No dysuria, hematuria, or difficulty voiding Musculoskeletal: Negative; no myalgias, joint pain, or weakness Hematologic/Oncology: Negative; no easy bruising, bleeding Endocrine: Negative; no heat/cold intolerance; no diabetes Neuro: Negative; no changes in balance,  headaches Skin: Negative; No rashes or skin lesions Psychiatric: Negative; No behavioral problems, depression Sleep: Negative; No snoring, daytime sleepiness, hypersomnolence, bruxism, restless legs, hypnogognic hallucinations, no cataplexy Other comprehensive 14 point system review is negative.   PE BP 138/72  Pulse 67  Ht 6' 1.5" (1.867 m)  Wt 251 lb 4.8 oz (113.989 kg)  BMI 32.70 kg/m2  Repeat blood pressure by me was 130/70 supine and in the standing position his blood pressure is 112/68. General: Alert, oriented, no distress.  Skin: normal turgor, no rashes HEENT: Normocephalic, atraumatic. Pupils round and reactive; sclera anicteric;no lid lag.  Nose without nasal septal hypertrophy Mouth/Parynx benign; Mallinpatti scale 2 Neck: No JVD, no carotid bruits with normal carotid upstrokes Lungs: clear to ausculatation and percussion; no wheezing or rales Chest wall: No tenderness to palpation Heart: PMI not displaced; regular rate and rhythm. s1 s2 normal   1-2/6 systolic murmur ; no S3. No rub. Abdomen: soft, nontender; no hepatosplenomehaly, BS+; abdominal aorta nontender and not dilated by palpation. Back: No CVA tenderness Pulses 2+ Extremities: no clubbing cyanosis or edema, Homan's sign negative  Neurologic: grossly nonfocal; cranial nerves grossly intact Psychological: Normal affect and mood  ECG (independently read by me): Normal sinus rhythm at 67 beats per minute.  First degree A-V block with PR interval at 290 ms.  QTc interval 439 ms.  No ST segment changes.  Prior June 2015 ECG (independently read by me): Normal sinus rhythm at 68 beats per minute with first degree AV block with a PR interval at 284 ms.  QTc interval 435 ms.  No significant ST-T changes.  T-wave inversion in aVL  ECG today (independently read by me):  Normal sinus rhythm at 71 beats per minute. First create a block with PR interval at 240 ms. QTc interval normal at 4-5 ms.  Prior 10/27/13 ECG (independently read by me): Atrial flutter with variable rate but predominantly 4-1 with occasional 3-1 block; average heart rate 70 beats per minute  Prior ECG from 06/04/2013: Sinus rhythm with first-degree AV block. Nonspecific interventricular block  LABS:  BMET    Component Value Date/Time   NA 137 03/07/2014 0904   K 4.4 03/07/2014 0904   CL 103 03/07/2014 0904   CO2 25 03/07/2014 0904   GLUCOSE 105* 03/07/2014 0904   BUN 17 03/07/2014 0904   CREATININE 1.08 03/07/2014 0904   CREATININE 0.99 01/17/2013 0420   CALCIUM 9.5 03/07/2014 0904   GFRNONAA 76* 01/17/2013 0420   GFRAA 89* 01/17/2013 0420     Hepatic Function Panel     Component Value Date/Time   PROT 6.8 03/07/2014 0904   ALBUMIN 4.2 03/07/2014 0904   AST 18 03/07/2014 0904   ALT 16 03/07/2014 0904   ALKPHOS 59 03/07/2014 0904   BILITOT 0.7 03/07/2014 0904     CBC    Component Value Date/Time   WBC 7.7 03/07/2014 0901   RBC 4.66 03/07/2014 0901   HGB 12.6* 03/07/2014 0901   HCT 38.2* 03/07/2014 0901   PLT  279 03/07/2014 0901   MCV 82.0 03/07/2014 0901   MCH 27.0 03/07/2014 0901   MCHC 33.0 03/07/2014 0901   RDW 16.1* 03/07/2014 0901   LYMPHSABS 1.9 03/07/2014 0901   MONOABS 0.7 03/07/2014 0901   EOSABS 0.2 03/07/2014 0901   BASOSABS 0.1 03/07/2014 0901     BNP No results found for this basename: probnp    Lipid Panel     Component Value Date/Time   TRIG 138 03/07/2014  0858   LDLCALC 59 03/07/2014 0858     RADIOLOGY: No results found.    ASSESSMENT AND PLAN:  Mr. Tolson  is 18 months months status post emergency CABG revascularization surgery after cardiac catheterization demonstrated severe life threatening coronary anatomy.  He has noticed a vague left chest sensation associated with mild shortness of breath.  This typically does not occur while he is at cardiac rehabilitation.  He  is in the maintenance phase of cardiac rehabilitation. He  developed atrial flutter in January and was started on anticoagulation with Xarelto. He is now status post successful DC cardioversion and he is maintaining normal sinus rhythm with first degree AV block.  I have recommended continuation of anticoagulation, particularly with the age greater than 73 years old, hypertension, diabetes, and peripheral vascular disease.  I did review his most recent stress test, as well as echo Doppler study.  He did develop transient left bundle branch block during exercise testing.  There was no area of ischemia.  He did not develop any wall motion abnormality.  I also reviewed.  Recent blood work done in June 2015.  An MR profile at that time showed improvement with his LDL particle number 104 4, calculated LDL cholesterol 59, triglycerides 138, and a small LDL particle number is now almost ideal at 530.  Insulin resistance score is improved, but still elevated at 63.  His hemoglobin A1c was 6.4.  He had normal thyroid function studies.  He is continuing with cardiac rehabilitation.  We discussed his recent weight gain, up 6  pounds over the past 4 months.  We discussed diet.  His blood pressure today is controlled on Diovan 160 mg in addition to Toprol-XL 25 mg.  I have recommended he continue presently with the Zetia and atorvastatin, but he was concerned about the cost of study a period in the past.  He did develop myalgias on higher dose statin.  I suggested he increase his Cardizem every 10-300 mg.  In the future may be worthwhile to rechallenge him with a higher dose of statin if he no longer wishes to take Zetia.  I will see him in 6 months for followup evaluation.  If he does develop recurrent symptomatology followup evaluation, sooner was recommended.  Lennette Bihari, MD, [facility]  06/25/2014 10:10 AM

## 2014-06-27 ENCOUNTER — Encounter (HOSPITAL_COMMUNITY)
Admission: RE | Admit: 2014-06-27 | Discharge: 2014-06-27 | Disposition: A | Discharge status: Home / Self Care | Payer: Self-pay | Source: Ambulatory Visit | Attending: Cardiovascular Disease | Admitting: Cardiovascular Disease

## 2014-06-29 ENCOUNTER — Encounter (HOSPITAL_COMMUNITY)
Admission: RE | Admit: 2014-06-29 | Discharge: 2014-06-29 | Disposition: A | Discharge status: Home / Self Care | Payer: Self-pay | Source: Ambulatory Visit | Attending: Cardiovascular Disease | Admitting: Cardiovascular Disease

## 2014-07-01 ENCOUNTER — Encounter (HOSPITAL_COMMUNITY)
Admission: RE | Admit: 2014-07-01 | Discharge: 2014-07-01 | Disposition: A | Discharge status: Home / Self Care | Payer: Self-pay | Source: Ambulatory Visit | Attending: Cardiovascular Disease | Admitting: Cardiovascular Disease

## 2014-07-04 ENCOUNTER — Encounter (HOSPITAL_COMMUNITY): Payer: Self-pay

## 2014-07-06 ENCOUNTER — Encounter (HOSPITAL_COMMUNITY): Payer: Self-pay

## 2014-07-08 ENCOUNTER — Encounter (HOSPITAL_COMMUNITY)
Admission: RE | Admit: 2014-07-08 | Discharge: 2014-07-08 | Disposition: A | Discharge status: Home / Self Care | Payer: Self-pay | Source: Ambulatory Visit | Attending: Cardiovascular Disease | Admitting: Cardiovascular Disease

## 2014-07-11 ENCOUNTER — Encounter (HOSPITAL_COMMUNITY): Payer: Self-pay

## 2014-07-13 ENCOUNTER — Encounter (HOSPITAL_COMMUNITY): Payer: Self-pay

## 2014-07-15 ENCOUNTER — Encounter (HOSPITAL_COMMUNITY)
Admission: RE | Admit: 2014-07-15 | Discharge: 2014-07-15 | Disposition: A | Discharge status: Home / Self Care | Payer: Self-pay | Source: Ambulatory Visit | Attending: Cardiovascular Disease | Admitting: Cardiovascular Disease

## 2014-07-18 ENCOUNTER — Encounter (HOSPITAL_COMMUNITY)
Admission: RE | Admit: 2014-07-18 | Discharge: 2014-07-18 | Disposition: A | Discharge status: Home / Self Care | Payer: Self-pay | Source: Ambulatory Visit | Attending: Cardiovascular Disease | Admitting: Cardiovascular Disease

## 2014-07-20 ENCOUNTER — Encounter (HOSPITAL_COMMUNITY)
Admission: RE | Admit: 2014-07-20 | Discharge: 2014-07-20 | Disposition: A | Discharge status: Home / Self Care | Payer: Self-pay | Source: Ambulatory Visit | Attending: Cardiovascular Disease | Admitting: Cardiovascular Disease

## 2014-07-22 ENCOUNTER — Encounter (HOSPITAL_COMMUNITY)
Admission: RE | Admit: 2014-07-22 | Discharge: 2014-07-22 | Disposition: A | Discharge status: Home / Self Care | Payer: Self-pay | Source: Ambulatory Visit | Attending: Cardiovascular Disease | Admitting: Cardiovascular Disease

## 2014-07-25 ENCOUNTER — Encounter (HOSPITAL_COMMUNITY)
Admission: RE | Admit: 2014-07-25 | Discharge: 2014-07-25 | Disposition: A | Discharge status: Home / Self Care | Payer: Self-pay | Source: Ambulatory Visit | Attending: Cardiovascular Disease | Admitting: Cardiovascular Disease

## 2014-07-25 DIAGNOSIS — Z79899 Other long term (current) drug therapy: Secondary | ICD-10-CM | POA: Insufficient documentation

## 2014-07-25 DIAGNOSIS — I10 Essential (primary) hypertension: Secondary | ICD-10-CM | POA: Insufficient documentation

## 2014-07-25 DIAGNOSIS — Z951 Presence of aortocoronary bypass graft: Secondary | ICD-10-CM | POA: Insufficient documentation

## 2014-07-25 DIAGNOSIS — E785 Hyperlipidemia, unspecified: Secondary | ICD-10-CM | POA: Insufficient documentation

## 2014-07-25 DIAGNOSIS — I2510 Atherosclerotic heart disease of native coronary artery without angina pectoris: Secondary | ICD-10-CM | POA: Insufficient documentation

## 2014-07-25 DIAGNOSIS — Z880 Allergy status to penicillin: Secondary | ICD-10-CM | POA: Insufficient documentation

## 2014-07-27 ENCOUNTER — Encounter (HOSPITAL_COMMUNITY)
Admission: RE | Admit: 2014-07-27 | Discharge: 2014-07-27 | Disposition: A | Discharge status: Home / Self Care | Payer: Self-pay | Source: Ambulatory Visit | Attending: Cardiovascular Disease | Admitting: Cardiovascular Disease

## 2014-07-29 ENCOUNTER — Encounter (HOSPITAL_COMMUNITY): Payer: Self-pay

## 2014-08-01 ENCOUNTER — Encounter (HOSPITAL_COMMUNITY)
Admission: RE | Admit: 2014-08-01 | Discharge: 2014-08-01 | Disposition: A | Discharge status: Home / Self Care | Payer: Self-pay | Source: Ambulatory Visit | Attending: Cardiovascular Disease | Admitting: Cardiovascular Disease

## 2014-08-03 ENCOUNTER — Encounter (HOSPITAL_COMMUNITY)
Admission: RE | Admit: 2014-08-03 | Discharge: 2014-08-03 | Disposition: A | Discharge status: Home / Self Care | Payer: Self-pay | Source: Ambulatory Visit | Attending: Cardiovascular Disease | Admitting: Cardiovascular Disease

## 2014-08-03 ENCOUNTER — Other Ambulatory Visit: Payer: Self-pay | Admitting: *Deleted

## 2014-08-03 MED ORDER — EZETIMIBE 10 MG PO TABS: 10.0000 mg | ORAL_TABLET | Freq: Every day | ORAL | Status: DC

## 2014-08-05 ENCOUNTER — Encounter (HOSPITAL_COMMUNITY)
Admission: RE | Admit: 2014-08-05 | Discharge: 2014-08-05 | Disposition: A | Discharge status: Home / Self Care | Payer: Self-pay | Source: Ambulatory Visit | Attending: Cardiovascular Disease | Admitting: Cardiovascular Disease

## 2014-08-08 ENCOUNTER — Encounter (HOSPITAL_COMMUNITY): Payer: Self-pay

## 2014-08-10 ENCOUNTER — Encounter (HOSPITAL_COMMUNITY): Payer: Self-pay

## 2014-08-12 ENCOUNTER — Encounter (HOSPITAL_COMMUNITY): Payer: Self-pay

## 2014-08-15 ENCOUNTER — Encounter (HOSPITAL_COMMUNITY): Payer: Self-pay

## 2014-08-17 ENCOUNTER — Encounter (HOSPITAL_COMMUNITY): Payer: Self-pay

## 2014-08-22 ENCOUNTER — Encounter (HOSPITAL_COMMUNITY): Payer: Self-pay

## 2014-08-24 ENCOUNTER — Encounter (HOSPITAL_COMMUNITY)
Admission: RE | Admit: 2014-08-24 | Discharge: 2014-08-24 | Disposition: A | Discharge status: Home / Self Care | Payer: Self-pay | Source: Ambulatory Visit | Attending: Cardiovascular Disease | Admitting: Cardiovascular Disease

## 2014-08-24 DIAGNOSIS — Z880 Allergy status to penicillin: Secondary | ICD-10-CM | POA: Insufficient documentation

## 2014-08-24 DIAGNOSIS — Z951 Presence of aortocoronary bypass graft: Secondary | ICD-10-CM | POA: Insufficient documentation

## 2014-08-24 DIAGNOSIS — Z79899 Other long term (current) drug therapy: Secondary | ICD-10-CM | POA: Insufficient documentation

## 2014-08-24 DIAGNOSIS — I10 Essential (primary) hypertension: Secondary | ICD-10-CM | POA: Insufficient documentation

## 2014-08-24 DIAGNOSIS — E785 Hyperlipidemia, unspecified: Secondary | ICD-10-CM | POA: Insufficient documentation

## 2014-08-24 DIAGNOSIS — I2510 Atherosclerotic heart disease of native coronary artery without angina pectoris: Secondary | ICD-10-CM | POA: Insufficient documentation

## 2014-08-26 ENCOUNTER — Encounter (HOSPITAL_COMMUNITY): Payer: Self-pay

## 2014-08-29 ENCOUNTER — Encounter (HOSPITAL_COMMUNITY)
Admission: RE | Admit: 2014-08-29 | Discharge: 2014-08-29 | Disposition: A | Discharge status: Home / Self Care | Payer: Self-pay | Source: Ambulatory Visit | Attending: Cardiovascular Disease | Admitting: Cardiovascular Disease

## 2014-08-31 ENCOUNTER — Encounter (HOSPITAL_COMMUNITY)
Admission: RE | Admit: 2014-08-31 | Discharge: 2014-08-31 | Disposition: A | Discharge status: Home / Self Care | Payer: Self-pay | Source: Ambulatory Visit | Attending: Cardiovascular Disease | Admitting: Cardiovascular Disease

## 2014-09-01 ENCOUNTER — Encounter (HOSPITAL_COMMUNITY): Payer: Self-pay | Admitting: Cardiovascular Disease

## 2014-09-02 ENCOUNTER — Encounter (HOSPITAL_COMMUNITY)
Admission: RE | Admit: 2014-09-02 | Discharge: 2014-09-02 | Disposition: A | Discharge status: Home / Self Care | Payer: Self-pay | Source: Ambulatory Visit | Attending: Cardiovascular Disease | Admitting: Cardiovascular Disease

## 2014-09-05 ENCOUNTER — Encounter (HOSPITAL_COMMUNITY)
Admission: RE | Admit: 2014-09-05 | Discharge: 2014-09-05 | Disposition: A | Discharge status: Home / Self Care | Payer: Self-pay | Source: Ambulatory Visit | Attending: Cardiovascular Disease | Admitting: Cardiovascular Disease

## 2014-09-07 ENCOUNTER — Encounter (HOSPITAL_COMMUNITY): Payer: Self-pay

## 2014-09-09 ENCOUNTER — Encounter (HOSPITAL_COMMUNITY)
Admission: RE | Admit: 2014-09-09 | Discharge: 2014-09-09 | Disposition: A | Discharge status: Home / Self Care | Payer: Self-pay | Source: Ambulatory Visit | Attending: Cardiovascular Disease | Admitting: Cardiovascular Disease

## 2014-09-12 ENCOUNTER — Encounter (HOSPITAL_COMMUNITY): Payer: Self-pay

## 2014-09-14 ENCOUNTER — Encounter (HOSPITAL_COMMUNITY): Payer: Self-pay

## 2014-09-16 IMAGING — CR DG CHEST 2V
2 series · 2 of 2 positions shown · non-contrast
Comparison: Chest x-ray of 01/18/2013

CLINICAL DATA: Post CABG 3-4 weeks ago, some shortness of breath,
former smoking history

CHEST - 2 VIEW

[w chest pa]
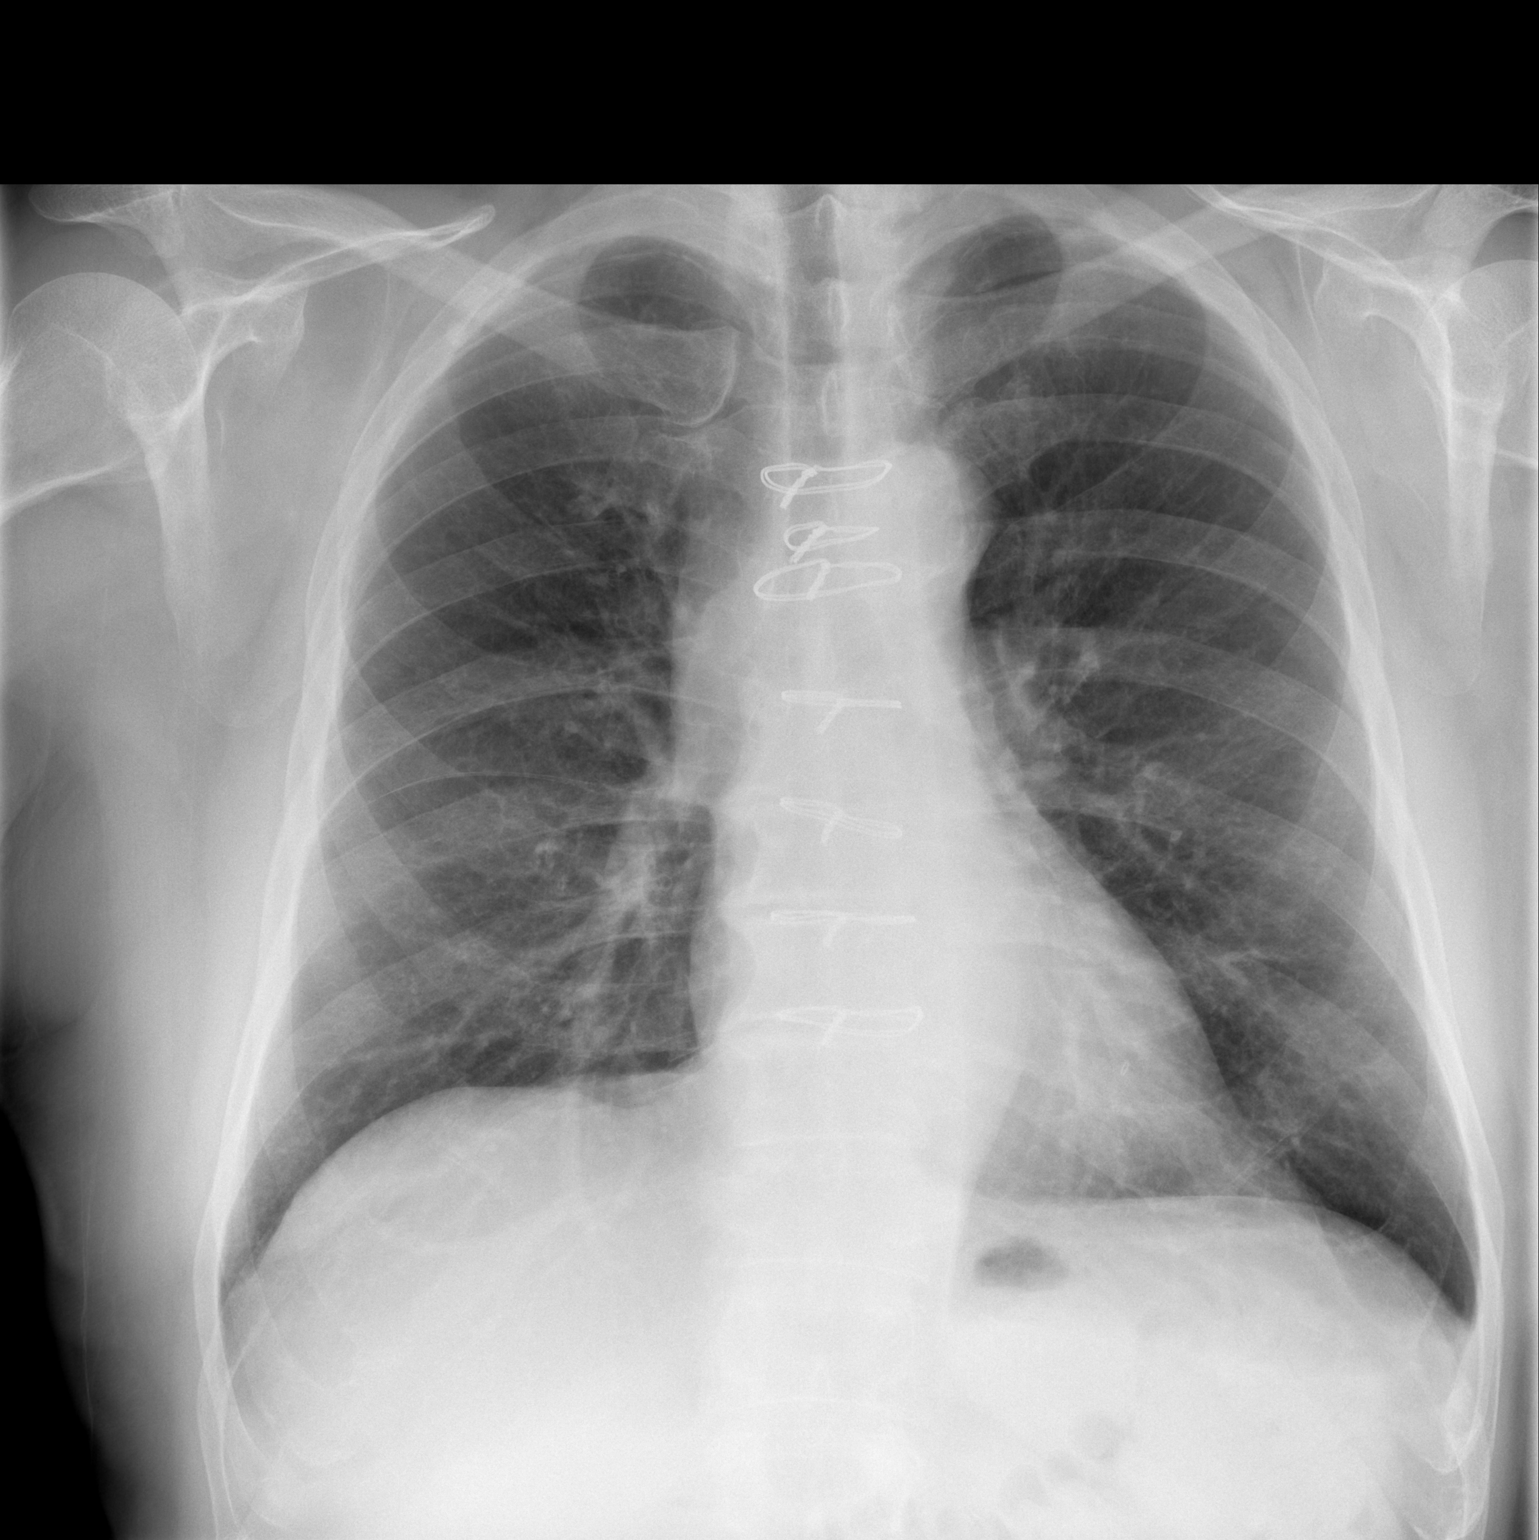

[w chest lat]
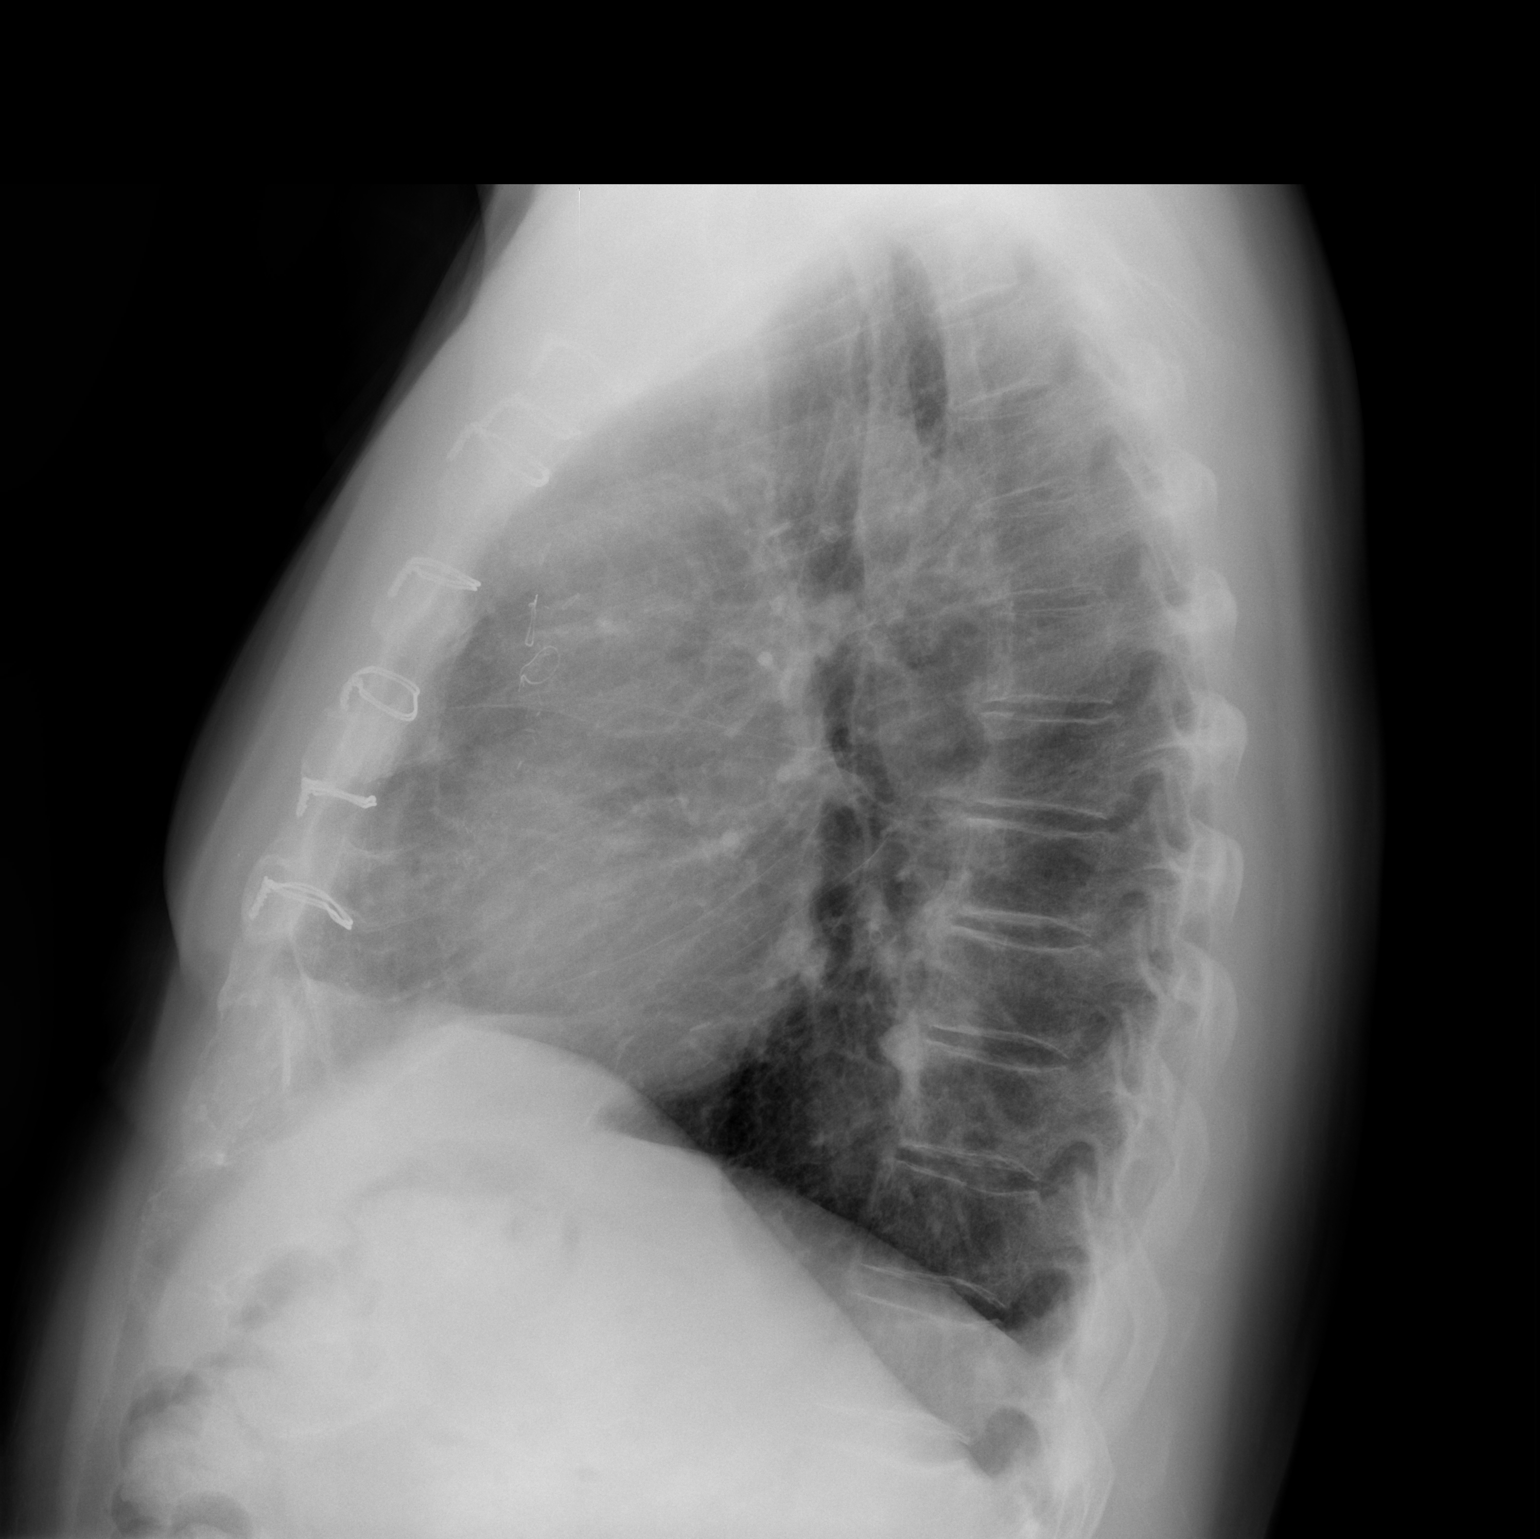

[2 of 2 positions shown; findings below may reference images not displayed]

FINDINGS: The small pleural effusions have resolved.  Aeration has
improved with resolution of basilar atelectasis.  Mild cardiomegaly
is stable.  Median sternotomy sutures are noted from CABG.
IMPRESSION: Resolution of effusions and basilar atelectasis.  No active lung
disease.

## 2014-09-19 ENCOUNTER — Encounter (HOSPITAL_COMMUNITY)
Admission: RE | Admit: 2014-09-19 | Discharge: 2014-09-19 | Disposition: A | Discharge status: Home / Self Care | Payer: Self-pay | Source: Ambulatory Visit | Attending: Cardiovascular Disease | Admitting: Cardiovascular Disease

## 2014-09-21 ENCOUNTER — Encounter (HOSPITAL_COMMUNITY): Payer: Self-pay

## 2014-09-26 ENCOUNTER — Encounter (HOSPITAL_COMMUNITY)
Admission: RE | Admit: 2014-09-26 | Discharge: 2014-09-26 | Disposition: A | Discharge status: Home / Self Care | Payer: Self-pay | Source: Ambulatory Visit | Attending: Cardiovascular Disease | Admitting: Cardiovascular Disease

## 2014-09-26 DIAGNOSIS — Z951 Presence of aortocoronary bypass graft: Secondary | ICD-10-CM | POA: Insufficient documentation

## 2014-09-26 DIAGNOSIS — Z79899 Other long term (current) drug therapy: Secondary | ICD-10-CM | POA: Insufficient documentation

## 2014-09-26 DIAGNOSIS — E785 Hyperlipidemia, unspecified: Secondary | ICD-10-CM | POA: Insufficient documentation

## 2014-09-26 DIAGNOSIS — I10 Essential (primary) hypertension: Secondary | ICD-10-CM | POA: Insufficient documentation

## 2014-09-26 DIAGNOSIS — I2510 Atherosclerotic heart disease of native coronary artery without angina pectoris: Secondary | ICD-10-CM | POA: Insufficient documentation

## 2014-09-26 DIAGNOSIS — Z880 Allergy status to penicillin: Secondary | ICD-10-CM | POA: Insufficient documentation

## 2014-09-28 ENCOUNTER — Encounter (HOSPITAL_COMMUNITY)
Admission: RE | Admit: 2014-09-28 | Discharge: 2014-09-28 | Disposition: A | Discharge status: Home / Self Care | Payer: Self-pay | Source: Ambulatory Visit | Attending: Cardiovascular Disease | Admitting: Cardiovascular Disease

## 2014-09-30 ENCOUNTER — Encounter (HOSPITAL_COMMUNITY): Payer: Self-pay

## 2014-10-03 ENCOUNTER — Encounter (HOSPITAL_COMMUNITY)
Admission: RE | Admit: 2014-10-03 | Discharge: 2014-10-03 | Disposition: A | Discharge status: Home / Self Care | Payer: Self-pay | Source: Ambulatory Visit | Attending: Cardiovascular Disease | Admitting: Cardiovascular Disease

## 2014-10-05 ENCOUNTER — Encounter (HOSPITAL_COMMUNITY)
Admission: RE | Admit: 2014-10-05 | Discharge: 2014-10-05 | Disposition: A | Discharge status: Home / Self Care | Payer: Self-pay | Source: Ambulatory Visit | Attending: Cardiovascular Disease | Admitting: Cardiovascular Disease

## 2014-10-07 ENCOUNTER — Encounter (HOSPITAL_COMMUNITY)
Admission: RE | Admit: 2014-10-07 | Discharge: 2014-10-07 | Disposition: A | Discharge status: Home / Self Care | Payer: Self-pay | Source: Ambulatory Visit | Attending: Cardiovascular Disease | Admitting: Cardiovascular Disease

## 2014-10-10 ENCOUNTER — Encounter (HOSPITAL_COMMUNITY): Payer: Self-pay

## 2014-10-12 ENCOUNTER — Encounter (HOSPITAL_COMMUNITY)
Admission: RE | Admit: 2014-10-12 | Discharge: 2014-10-12 | Disposition: A | Discharge status: Home / Self Care | Payer: Self-pay | Source: Ambulatory Visit | Attending: Cardiovascular Disease | Admitting: Cardiovascular Disease

## 2014-10-12 NOTE — Progress Notes (Signed)
Pt experienced nosebleed at cardiac rehab today while doing light warm up stretches. Pt did not exercise aerobically. Pt reports nosebleed off and on all morning today.  Pt denies history of frequent nosebleed however pt is taking xarelto.  Pressure applied, nose packed with dry gauze and ice pack applied.  Bleeding stopped after 30 minutes.  Pt declined offer to be seen today at 3:15 with PCP Dr. Kevan NyGates.  Pt instructed to seek medical care at urgent care or ENT office if bleeding returns.  Also instructed to notify cardiologist of nosebleed.  Understanding verbalized

## 2014-10-14 ENCOUNTER — Encounter (HOSPITAL_COMMUNITY): Payer: Self-pay

## 2014-10-17 ENCOUNTER — Encounter (HOSPITAL_COMMUNITY): Payer: Self-pay

## 2014-10-19 ENCOUNTER — Encounter (HOSPITAL_COMMUNITY): Payer: Self-pay

## 2014-10-21 ENCOUNTER — Encounter (HOSPITAL_COMMUNITY)
Admission: RE | Admit: 2014-10-21 | Discharge: 2014-10-21 | Disposition: A | Discharge status: Home / Self Care | Payer: Self-pay | Source: Ambulatory Visit | Attending: Cardiovascular Disease | Admitting: Cardiovascular Disease

## 2014-10-24 ENCOUNTER — Encounter (HOSPITAL_COMMUNITY): Payer: Self-pay

## 2014-10-24 DIAGNOSIS — Z880 Allergy status to penicillin: Secondary | ICD-10-CM | POA: Insufficient documentation

## 2014-10-24 DIAGNOSIS — E785 Hyperlipidemia, unspecified: Secondary | ICD-10-CM | POA: Insufficient documentation

## 2014-10-24 DIAGNOSIS — Z79899 Other long term (current) drug therapy: Secondary | ICD-10-CM | POA: Insufficient documentation

## 2014-10-24 DIAGNOSIS — Z951 Presence of aortocoronary bypass graft: Secondary | ICD-10-CM | POA: Insufficient documentation

## 2014-10-24 DIAGNOSIS — I2510 Atherosclerotic heart disease of native coronary artery without angina pectoris: Secondary | ICD-10-CM | POA: Insufficient documentation

## 2014-10-24 DIAGNOSIS — I10 Essential (primary) hypertension: Secondary | ICD-10-CM | POA: Insufficient documentation

## 2014-10-26 ENCOUNTER — Encounter (HOSPITAL_COMMUNITY)
Admission: RE | Admit: 2014-10-26 | Discharge: 2014-10-26 | Disposition: A | Discharge status: Home / Self Care | Payer: Self-pay | Source: Ambulatory Visit | Attending: Cardiovascular Disease | Admitting: Cardiovascular Disease

## 2014-10-28 ENCOUNTER — Encounter (HOSPITAL_COMMUNITY)
Admission: RE | Admit: 2014-10-28 | Discharge: 2014-10-28 | Disposition: A | Discharge status: Home / Self Care | Payer: Self-pay | Source: Ambulatory Visit | Attending: Cardiovascular Disease | Admitting: Cardiovascular Disease

## 2014-10-28 NOTE — Progress Notes (Addendum)
Pt at cardiac maintenance rehab reporting nosebleeds off and on past few days.  No bleeding today.  Pt states he is established with Dr. Haroldine Lawsrossley, ENT.  PC to Dr. Haroldine Lawsrossley office to make appointment. Spoke to Dr. Haroldine Lawsrossley, who advised pt should present to ED for persistent unrelieved nose bleed.  To prevent nose bleeds pt should hydrate nasal passages with saline nasal spray and neosporin applied to nose daily.  Pt should also avoid heavy lifting, emotional upset and heavy exercise until resolved.  Again, pt instructed to contact cardiologist about frequency of nose bleeds while taking xarelto.   Pt informed.  Understanding verbalized.

## 2014-10-31 ENCOUNTER — Encounter (HOSPITAL_COMMUNITY)
Admission: RE | Admit: 2014-10-31 | Discharge: 2014-10-31 | Disposition: A | Discharge status: Home / Self Care | Payer: Self-pay | Source: Ambulatory Visit | Attending: Cardiovascular Disease | Admitting: Cardiovascular Disease

## 2014-11-02 ENCOUNTER — Other Ambulatory Visit: Payer: Self-pay | Admitting: Dermatology

## 2014-11-02 ENCOUNTER — Encounter (HOSPITAL_COMMUNITY)
Admission: RE | Admit: 2014-11-02 | Discharge: 2014-11-02 | Disposition: A | Discharge status: Home / Self Care | Payer: Self-pay | Source: Ambulatory Visit | Attending: Cardiovascular Disease | Admitting: Cardiovascular Disease

## 2014-11-04 ENCOUNTER — Other Ambulatory Visit: Payer: Self-pay | Admitting: Cardiovascular Disease

## 2014-11-04 ENCOUNTER — Encounter (HOSPITAL_COMMUNITY): Payer: Self-pay

## 2014-11-04 MED ORDER — RIVAROXABAN 20 MG PO TABS: 20.0000 mg | ORAL_TABLET | Freq: Every day | ORAL | Status: DC

## 2014-11-04 NOTE — Telephone Encounter (Signed)
Spoke to patient   inform patient--- E-sent medication  30 day supply. Verbalized understanding

## 2014-11-04 NOTE — Telephone Encounter (Signed)
Mr. Derek Wells is calling about his Xarelto refill . The Pharmacy sent it in but is saying that we did not respond back to the refill request for the Xarelto . Please call    Thanks

## 2014-11-06 ENCOUNTER — Other Ambulatory Visit: Payer: Self-pay | Admitting: Pharmacist Clinician (PhC)/ Clinical Pharmacy Specialist

## 2014-11-06 MED ORDER — RIVAROXABAN 20 MG PO TABS: 20.0000 mg | ORAL_TABLET | Freq: Every day | ORAL | Status: DC

## 2014-11-07 ENCOUNTER — Encounter (HOSPITAL_COMMUNITY): Payer: Self-pay

## 2014-11-09 ENCOUNTER — Encounter (HOSPITAL_COMMUNITY): Payer: Self-pay

## 2014-11-11 ENCOUNTER — Encounter (HOSPITAL_COMMUNITY)
Admission: RE | Admit: 2014-11-11 | Discharge: 2014-11-11 | Disposition: A | Discharge status: Home / Self Care | Payer: Self-pay | Source: Ambulatory Visit | Attending: Cardiovascular Disease | Admitting: Cardiovascular Disease

## 2014-11-14 ENCOUNTER — Encounter (HOSPITAL_COMMUNITY)
Admission: RE | Admit: 2014-11-14 | Discharge: 2014-11-14 | Disposition: A | Discharge status: Home / Self Care | Payer: Self-pay | Source: Ambulatory Visit | Attending: Cardiovascular Disease | Admitting: Cardiovascular Disease

## 2014-11-16 ENCOUNTER — Encounter (HOSPITAL_COMMUNITY): Payer: Self-pay

## 2014-11-18 ENCOUNTER — Encounter (HOSPITAL_COMMUNITY): Payer: Self-pay

## 2014-11-21 ENCOUNTER — Encounter (HOSPITAL_COMMUNITY): Payer: Self-pay

## 2014-11-23 ENCOUNTER — Encounter (HOSPITAL_COMMUNITY): Payer: Self-pay

## 2014-11-23 DIAGNOSIS — I2510 Atherosclerotic heart disease of native coronary artery without angina pectoris: Secondary | ICD-10-CM | POA: Insufficient documentation

## 2014-11-23 DIAGNOSIS — Z880 Allergy status to penicillin: Secondary | ICD-10-CM | POA: Insufficient documentation

## 2014-11-23 DIAGNOSIS — I10 Essential (primary) hypertension: Secondary | ICD-10-CM | POA: Insufficient documentation

## 2014-11-23 DIAGNOSIS — Z951 Presence of aortocoronary bypass graft: Secondary | ICD-10-CM | POA: Insufficient documentation

## 2014-11-23 DIAGNOSIS — Z79899 Other long term (current) drug therapy: Secondary | ICD-10-CM | POA: Insufficient documentation

## 2014-11-23 DIAGNOSIS — E785 Hyperlipidemia, unspecified: Secondary | ICD-10-CM | POA: Insufficient documentation

## 2014-11-25 ENCOUNTER — Encounter (HOSPITAL_COMMUNITY): Payer: Self-pay

## 2014-11-28 ENCOUNTER — Encounter (HOSPITAL_COMMUNITY)
Admission: RE | Admit: 2014-11-28 | Discharge: 2014-11-28 | Disposition: A | Discharge status: Home / Self Care | Payer: Self-pay | Source: Ambulatory Visit | Attending: Cardiovascular Disease | Admitting: Cardiovascular Disease

## 2014-11-30 ENCOUNTER — Encounter (HOSPITAL_COMMUNITY)
Admission: RE | Admit: 2014-11-30 | Discharge: 2014-11-30 | Disposition: A | Discharge status: Home / Self Care | Payer: Self-pay | Source: Ambulatory Visit | Attending: Cardiovascular Disease | Admitting: Cardiovascular Disease

## 2014-12-02 ENCOUNTER — Encounter (HOSPITAL_COMMUNITY)
Admission: RE | Admit: 2014-12-02 | Discharge: 2014-12-02 | Disposition: A | Discharge status: Home / Self Care | Payer: Self-pay | Source: Ambulatory Visit | Attending: Cardiovascular Disease | Admitting: Cardiovascular Disease

## 2014-12-05 ENCOUNTER — Encounter (HOSPITAL_COMMUNITY)
Admission: RE | Admit: 2014-12-05 | Discharge: 2014-12-05 | Disposition: A | Discharge status: Home / Self Care | Payer: Self-pay | Source: Ambulatory Visit | Attending: Cardiovascular Disease | Admitting: Cardiovascular Disease

## 2014-12-07 ENCOUNTER — Encounter (HOSPITAL_COMMUNITY)
Admission: RE | Admit: 2014-12-07 | Discharge: 2014-12-07 | Disposition: A | Discharge status: Home / Self Care | Payer: Self-pay | Source: Ambulatory Visit | Attending: Cardiovascular Disease | Admitting: Cardiovascular Disease

## 2014-12-09 ENCOUNTER — Encounter (HOSPITAL_COMMUNITY)
Admission: RE | Admit: 2014-12-09 | Discharge: 2014-12-09 | Disposition: A | Discharge status: Home / Self Care | Payer: Self-pay | Source: Ambulatory Visit | Attending: Cardiovascular Disease | Admitting: Cardiovascular Disease

## 2014-12-12 ENCOUNTER — Encounter (HOSPITAL_COMMUNITY)
Admission: RE | Admit: 2014-12-12 | Discharge: 2014-12-12 | Disposition: A | Discharge status: Home / Self Care | Payer: Self-pay | Source: Ambulatory Visit | Attending: Cardiovascular Disease | Admitting: Cardiovascular Disease

## 2014-12-14 ENCOUNTER — Encounter (HOSPITAL_COMMUNITY)
Admission: RE | Admit: 2014-12-14 | Discharge: 2014-12-14 | Disposition: A | Discharge status: Home / Self Care | Payer: Self-pay | Source: Ambulatory Visit | Attending: Cardiovascular Disease | Admitting: Cardiovascular Disease

## 2014-12-16 ENCOUNTER — Encounter (HOSPITAL_COMMUNITY): Payer: Self-pay

## 2014-12-19 ENCOUNTER — Encounter (HOSPITAL_COMMUNITY): Payer: Self-pay

## 2014-12-21 ENCOUNTER — Encounter (HOSPITAL_COMMUNITY): Payer: Self-pay

## 2014-12-23 ENCOUNTER — Encounter (HOSPITAL_COMMUNITY): Payer: Self-pay

## 2014-12-23 DIAGNOSIS — I2510 Atherosclerotic heart disease of native coronary artery without angina pectoris: Secondary | ICD-10-CM | POA: Insufficient documentation

## 2014-12-23 DIAGNOSIS — Z951 Presence of aortocoronary bypass graft: Secondary | ICD-10-CM | POA: Insufficient documentation

## 2014-12-26 ENCOUNTER — Telehealth: Payer: Self-pay | Admitting: *Deleted

## 2014-12-26 ENCOUNTER — Encounter (HOSPITAL_COMMUNITY)
Admission: RE | Admit: 2014-12-26 | Discharge: 2014-12-26 | Disposition: A | Discharge status: Home / Self Care | Payer: Self-pay | Source: Ambulatory Visit | Attending: Cardiovascular Disease | Admitting: Cardiovascular Disease

## 2014-12-26 NOTE — Telephone Encounter (Signed)
Faxed cardiac rehab order to [facility]

## 2014-12-28 ENCOUNTER — Other Ambulatory Visit: Payer: Self-pay | Admitting: Dermatology

## 2014-12-28 ENCOUNTER — Encounter (HOSPITAL_COMMUNITY): Payer: Self-pay

## 2014-12-30 ENCOUNTER — Encounter (HOSPITAL_COMMUNITY): Payer: Self-pay

## 2015-01-02 ENCOUNTER — Encounter (HOSPITAL_COMMUNITY): Payer: Self-pay

## 2015-01-04 ENCOUNTER — Encounter (HOSPITAL_COMMUNITY): Payer: Self-pay

## 2015-01-06 ENCOUNTER — Encounter (HOSPITAL_COMMUNITY): Payer: Self-pay

## 2015-01-09 ENCOUNTER — Encounter (HOSPITAL_COMMUNITY): Payer: Self-pay

## 2015-01-11 ENCOUNTER — Encounter (HOSPITAL_COMMUNITY): Payer: Self-pay

## 2015-01-13 ENCOUNTER — Encounter (HOSPITAL_COMMUNITY): Payer: Self-pay

## 2015-01-16 ENCOUNTER — Encounter (HOSPITAL_COMMUNITY)
Admission: RE | Admit: 2015-01-16 | Discharge: 2015-01-16 | Disposition: A | Discharge status: Home / Self Care | Payer: Self-pay | Source: Ambulatory Visit | Attending: Cardiovascular Disease | Admitting: Cardiovascular Disease

## 2015-01-18 ENCOUNTER — Encounter (HOSPITAL_COMMUNITY)
Admission: RE | Admit: 2015-01-18 | Discharge: 2015-01-18 | Disposition: A | Discharge status: Home / Self Care | Payer: Self-pay | Source: Ambulatory Visit | Attending: Cardiovascular Disease | Admitting: Cardiovascular Disease

## 2015-01-20 ENCOUNTER — Encounter (HOSPITAL_COMMUNITY): Payer: Self-pay

## 2015-01-23 ENCOUNTER — Encounter (HOSPITAL_COMMUNITY)
Admission: RE | Admit: 2015-01-23 | Discharge: 2015-01-23 | Disposition: A | Discharge status: Home / Self Care | Payer: Self-pay | Source: Ambulatory Visit | Attending: Cardiovascular Disease | Admitting: Cardiovascular Disease

## 2015-01-23 DIAGNOSIS — Z9889 Other specified postprocedural states: Secondary | ICD-10-CM | POA: Insufficient documentation

## 2015-01-23 DIAGNOSIS — R0609 Other forms of dyspnea: Secondary | ICD-10-CM | POA: Insufficient documentation

## 2015-01-23 DIAGNOSIS — I4892 Unspecified atrial flutter: Secondary | ICD-10-CM | POA: Insufficient documentation

## 2015-01-23 DIAGNOSIS — Z951 Presence of aortocoronary bypass graft: Secondary | ICD-10-CM | POA: Insufficient documentation

## 2015-01-23 DIAGNOSIS — Z87891 Personal history of nicotine dependence: Secondary | ICD-10-CM | POA: Insufficient documentation

## 2015-01-23 DIAGNOSIS — I2510 Atherosclerotic heart disease of native coronary artery without angina pectoris: Secondary | ICD-10-CM | POA: Insufficient documentation

## 2015-01-23 DIAGNOSIS — I10 Essential (primary) hypertension: Secondary | ICD-10-CM | POA: Insufficient documentation

## 2015-01-23 DIAGNOSIS — E785 Hyperlipidemia, unspecified: Secondary | ICD-10-CM | POA: Insufficient documentation

## 2015-01-23 DIAGNOSIS — I714 Abdominal aortic aneurysm, without rupture: Secondary | ICD-10-CM | POA: Insufficient documentation

## 2015-01-25 ENCOUNTER — Encounter (HOSPITAL_COMMUNITY): Payer: Self-pay

## 2015-01-27 ENCOUNTER — Encounter (HOSPITAL_COMMUNITY)
Admission: RE | Admit: 2015-01-27 | Discharge: 2015-01-27 | Disposition: A | Discharge status: Home / Self Care | Payer: Self-pay | Source: Ambulatory Visit | Attending: Cardiovascular Disease | Admitting: Cardiovascular Disease

## 2015-01-30 ENCOUNTER — Encounter (HOSPITAL_COMMUNITY)
Admission: RE | Admit: 2015-01-30 | Discharge: 2015-01-30 | Disposition: A | Discharge status: Home / Self Care | Payer: Self-pay | Source: Ambulatory Visit | Attending: Cardiovascular Disease | Admitting: Cardiovascular Disease

## 2015-02-01 ENCOUNTER — Encounter (HOSPITAL_COMMUNITY): Payer: Self-pay

## 2015-02-03 ENCOUNTER — Encounter (HOSPITAL_COMMUNITY): Payer: Self-pay

## 2015-02-06 ENCOUNTER — Encounter (HOSPITAL_COMMUNITY): Payer: Self-pay

## 2015-02-08 ENCOUNTER — Encounter (HOSPITAL_COMMUNITY): Payer: Self-pay

## 2015-02-10 ENCOUNTER — Encounter (HOSPITAL_COMMUNITY): Payer: Self-pay

## 2015-02-13 ENCOUNTER — Encounter (HOSPITAL_COMMUNITY)
Admission: RE | Admit: 2015-02-13 | Discharge: 2015-02-13 | Disposition: A | Discharge status: Home / Self Care | Payer: Self-pay | Source: Ambulatory Visit | Attending: Cardiovascular Disease | Admitting: Cardiovascular Disease

## 2015-02-15 ENCOUNTER — Encounter (HOSPITAL_COMMUNITY)
Admission: RE | Admit: 2015-02-15 | Discharge: 2015-02-15 | Disposition: A | Discharge status: Home / Self Care | Payer: Self-pay | Source: Ambulatory Visit | Attending: Cardiovascular Disease | Admitting: Cardiovascular Disease

## 2015-02-15 ENCOUNTER — Telehealth: Payer: Self-pay | Admitting: Cardiovascular Disease

## 2015-02-15 NOTE — Telephone Encounter (Signed)
PATIENT HAD A QUESTION ABOUT NUTRISYSTEM PROGRAM PATIENT STATES IT IS MORE TOWARDS  DIABETIC FOCUS. RN WATCH THE SODIUM AND POTASSIUM INTAKE. CARB CONTROL.  PATIENT STATES HE IS STILL HAVING DISCOMFORT WITH WALKING IN NEIGHBORHOOD ,BUT NOT AT CARDIAC REHAB. APPOINTMENT  WAS SCHEDULE FOR 04/2015- HE WANTED TO KNOW IF SEE DR KELLY SOONER.  APPOINTMENT  MADE FOR March 02 2015 2:30 PM

## 2015-02-15 NOTE — Telephone Encounter (Signed)
Pt wanted to speak with Dr. Landry DykeKelly's nurse about a new diet he is starting and wanted to know how he would be effected with the medications he is currently taking. Please call  Thanks

## 2015-02-17 ENCOUNTER — Encounter (HOSPITAL_COMMUNITY)
Admission: RE | Admit: 2015-02-17 | Discharge: 2015-02-17 | Disposition: A | Discharge status: Home / Self Care | Payer: Self-pay | Source: Ambulatory Visit | Attending: Cardiovascular Disease | Admitting: Cardiovascular Disease

## 2015-02-22 ENCOUNTER — Encounter (HOSPITAL_COMMUNITY)
Admission: RE | Admit: 2015-02-22 | Discharge: 2015-02-22 | Disposition: A | Discharge status: Home / Self Care | Payer: Self-pay | Source: Ambulatory Visit | Attending: Cardiovascular Disease | Admitting: Cardiovascular Disease

## 2015-02-22 DIAGNOSIS — I2510 Atherosclerotic heart disease of native coronary artery without angina pectoris: Secondary | ICD-10-CM | POA: Insufficient documentation

## 2015-02-22 DIAGNOSIS — Z951 Presence of aortocoronary bypass graft: Secondary | ICD-10-CM | POA: Insufficient documentation

## 2015-02-22 DIAGNOSIS — E785 Hyperlipidemia, unspecified: Secondary | ICD-10-CM | POA: Insufficient documentation

## 2015-02-22 DIAGNOSIS — I4892 Unspecified atrial flutter: Secondary | ICD-10-CM | POA: Insufficient documentation

## 2015-02-22 DIAGNOSIS — R0609 Other forms of dyspnea: Secondary | ICD-10-CM | POA: Insufficient documentation

## 2015-02-22 DIAGNOSIS — Z87891 Personal history of nicotine dependence: Secondary | ICD-10-CM | POA: Insufficient documentation

## 2015-02-22 DIAGNOSIS — Z9889 Other specified postprocedural states: Secondary | ICD-10-CM | POA: Insufficient documentation

## 2015-02-22 DIAGNOSIS — I10 Essential (primary) hypertension: Secondary | ICD-10-CM | POA: Insufficient documentation

## 2015-02-22 DIAGNOSIS — I714 Abdominal aortic aneurysm, without rupture: Secondary | ICD-10-CM | POA: Insufficient documentation

## 2015-02-24 ENCOUNTER — Encounter (HOSPITAL_COMMUNITY)
Admission: RE | Admit: 2015-02-24 | Discharge: 2015-02-24 | Disposition: A | Discharge status: Home / Self Care | Payer: Self-pay | Source: Ambulatory Visit | Attending: Cardiovascular Disease | Admitting: Cardiovascular Disease

## 2015-02-27 ENCOUNTER — Encounter (HOSPITAL_COMMUNITY)
Admission: RE | Admit: 2015-02-27 | Discharge: 2015-02-27 | Disposition: A | Discharge status: Home / Self Care | Payer: Self-pay | Source: Ambulatory Visit | Attending: Cardiovascular Disease | Admitting: Cardiovascular Disease

## 2015-03-01 ENCOUNTER — Telehealth: Payer: Self-pay | Admitting: Cardiovascular Disease

## 2015-03-01 ENCOUNTER — Encounter (HOSPITAL_COMMUNITY)
Admission: RE | Admit: 2015-03-01 | Discharge: 2015-03-01 | Disposition: A | Discharge status: Home / Self Care | Payer: Self-pay | Source: Ambulatory Visit | Attending: Cardiovascular Disease | Admitting: Cardiovascular Disease

## 2015-03-01 DIAGNOSIS — Z79899 Other long term (current) drug therapy: Secondary | ICD-10-CM

## 2015-03-01 DIAGNOSIS — E782 Mixed hyperlipidemia: Secondary | ICD-10-CM

## 2015-03-01 DIAGNOSIS — R5381 Other malaise: Secondary | ICD-10-CM

## 2015-03-01 DIAGNOSIS — E119 Type 2 diabetes mellitus without complications: Secondary | ICD-10-CM

## 2015-03-01 NOTE — Telephone Encounter (Signed)
Informed pt annual labwork ordered. Understanding verbalized.

## 2015-03-01 NOTE — Telephone Encounter (Signed)
Pt is scheduled for appt with Dr Tresa EndoKelly tomorrow. Wants to know if pt can come in for lab work or does he need lab work this morning?

## 2015-03-02 ENCOUNTER — Encounter: Payer: Self-pay | Admitting: Cardiovascular Disease

## 2015-03-02 ENCOUNTER — Ambulatory Visit (INDEPENDENT_AMBULATORY_CARE_PROVIDER_SITE_OTHER): Payer: Medicare Other | Admitting: Cardiovascular Disease

## 2015-03-02 VITALS — BP 144/70 | HR 75 | Ht 73.5 in | Wt 244.8 lb

## 2015-03-02 DIAGNOSIS — R0600 Dyspnea, unspecified: Secondary | ICD-10-CM

## 2015-03-02 DIAGNOSIS — I2510 Atherosclerotic heart disease of native coronary artery without angina pectoris: Secondary | ICD-10-CM

## 2015-03-02 DIAGNOSIS — E785 Hyperlipidemia, unspecified: Secondary | ICD-10-CM | POA: Diagnosis not present

## 2015-03-02 DIAGNOSIS — Z8679 Personal history of other diseases of the circulatory system: Secondary | ICD-10-CM

## 2015-03-02 DIAGNOSIS — I714 Abdominal aortic aneurysm, without rupture, unspecified: Secondary | ICD-10-CM

## 2015-03-02 DIAGNOSIS — R0609 Other forms of dyspnea: Secondary | ICD-10-CM

## 2015-03-02 DIAGNOSIS — Z9889 Other specified postprocedural states: Secondary | ICD-10-CM

## 2015-03-02 DIAGNOSIS — I10 Essential (primary) hypertension: Secondary | ICD-10-CM

## 2015-03-02 DIAGNOSIS — I4892 Unspecified atrial flutter: Secondary | ICD-10-CM

## 2015-03-02 DIAGNOSIS — Z951 Presence of aortocoronary bypass graft: Secondary | ICD-10-CM

## 2015-03-02 MED ORDER — METOPROLOL SUCCINATE ER 25 MG PO TB24: 37.5000 mg | ORAL_TABLET | Freq: Every day | ORAL | Status: DC

## 2015-03-02 NOTE — Patient Instructions (Signed)
Your physician has recommended you make the following change in your medication: the toprol XL 25 mg has been increased to 1.5  Tablets daily. =37.5 mg A new prescription has been sent to your pharmacy to reflect this change.  Your physician recommends that you return for lab work tomorrow fasting.  Your physician has requested that you have an abdominal aorta duplex. During this test, an ultrasound is used to evaluate the aorta. Allow 30 minutes for this exam. Do not eat after midnight the day before and avoid carbonated beverages  Your physician recommends that you schedule a follow-up appointment in: 3 months.

## 2015-03-03 ENCOUNTER — Encounter (HOSPITAL_COMMUNITY): Payer: Self-pay

## 2015-03-03 ENCOUNTER — Encounter: Payer: Self-pay | Admitting: Cardiovascular Disease

## 2015-03-03 LAB — CBC
HCT: 31.5 % — ABNORMAL LOW (ref 39.0–52.0)
HEMOGLOBIN: 9.7 g/dL — AB (ref 13.0–17.0)
MCH: 23.5 pg — AB (ref 26.0–34.0)
MCHC: 30.8 g/dL (ref 30.0–36.0)
MCV: 76.3 fL — AB (ref 78.0–100.0)
MPV: 10.3 fL (ref 8.6–12.4)
Platelets: 309 10*3/uL (ref 150–400)
RBC: 4.13 MIL/uL — AB (ref 4.22–5.81)
RDW: 16.9 % — ABNORMAL HIGH (ref 11.5–15.5)
WBC: 8.1 10*3/uL (ref 4.0–10.5)

## 2015-03-03 LAB — COMPREHENSIVE METABOLIC PANEL
ALBUMIN: 3.9 g/dL (ref 3.5–5.2)
ALT: 13 U/L (ref 0–53)
AST: 15 U/L (ref 0–37)
Alkaline Phosphatase: 56 U/L (ref 39–117)
BUN: 18 mg/dL (ref 6–23)
CALCIUM: 9.1 mg/dL (ref 8.4–10.5)
CO2: 24 mEq/L (ref 19–32)
Chloride: 104 mEq/L (ref 96–112)
Creat: 0.93 mg/dL (ref 0.50–1.35)
Glucose, Bld: 107 mg/dL — ABNORMAL HIGH (ref 70–99)
POTASSIUM: 4.8 meq/L (ref 3.5–5.3)
Sodium: 143 mEq/L (ref 135–145)
TOTAL PROTEIN: 6.5 g/dL (ref 6.0–8.3)
Total Bilirubin: 0.7 mg/dL (ref 0.2–1.2)

## 2015-03-03 NOTE — Progress Notes (Signed)
Patient ID: Derek Wells, male   DOB: 1933-06-19, 81 y.o.   MRN: [id number]     HPI: Derek Wells is a 81 y.o. male who presents to the office today for 4 month followup cardiology evaluation   Derek Wells has a history of hypertension, hyperlipidemia, and is s/p abdominal aortic aneurysm repair in April 2010. I had seen him on 01/14/2013 at which time he had experienced symptoms worrisome for unstable angina. Previously, in September 2013 he had undergone a nuclear perfusion study  which was essentially normal although did show mild diaphragmatic attenuation. He also been found to have a atherogenic dyslipidemia pattern with reference to his lipid panel. Catheterization the following day on 01/15/2013 revealed life-threatening coronary anatomy with 60-70% ostial left main stenosis, 90-95% distal left main stenosis, 95-99% ostial LAD stenosis, and mild/moderate stenoses in the RCA. That same day he was successfully operated by Dr. Arvid Right and underwent CABG x3 with a LIMA to the LAD, SVG to the OM, and SVG to the PDA. He had a unremarkable postoperative course and was discharged 4 days later.  He saw [name] on 01/27/13 and was doing well. I saw him in followup on 03/02/2013 and he  participating  in cardiac rehabilitation. When I saw him in September 2014, he was in sinus rhythm and has and had resumed a good level of activity.   In January 2015, he was found to be in atrial flutter at cardiac rehabilitation. He was unaware of his heart rhythm being abnormal. He had developed a URI over the holidays prior to that and had just completed a steroid dose pack. Xarelto 20 mg was added to his medical regimen. He did undergo subsequent blood work on January 20 which showed normal renal function and normal electrolytes. Liver function studies were normal. Hemoglobin was 13.7 hematocrit 41.7. Platelets were normal. TSH was normal at 2.63. Magnesium was normal at 2.0.   He underwent successful cardioversion for  his atrial flutter on 11/12/2013 and he cardioverted to sinus rhythm at 100 J.  Since his cardioversion, he is unaware of any recurrent rhythm disturbance.   He is in the maintenance phase of cardiac rehabilitation.  When I  saw him in June 2015, he told me that he had noticed several episodes of a vague chest sensation particularly when he rolls his garbage can back up a hill at home.    On 03/18/2014, he underwent a nuclear perfusion study, which showed reduced exercise capacity with a workload of only 5.9.  He did experience mild shortness of breath.  There was no significant ST depression, but he had developed transient left bundle branch block.  The scan was interpreted at low as low risk.  Ejection fraction was 65% without wall motion abnormalities.  An echo Doppler study showed an EF of 60-65%.  There were no regional wall motion abnormalities.  He had mild biatrial enlargement.  Derek Wells currently is playing golf at least 2 times per week and occasionally 3.  He is in the maintenance phase of cardiac rehabilitation.  He does admit to some weight gain.  He denies any episodes of chest pain, particularly during the cardiac rehabilitation program.  He has noticed some mild shortness of breath with deep breathing and some vague pressure in his chest which is short-lived walking up hills when he walks fast with his wife.  He has noticed some mild shortness of breath walking up steep hills in the golf [place] without chest pressure.  Since I last saw him, he had a melanoma removed from his left fore arm on April 1.  He presents for evaluation  Past Medical History  Diagnosis Date  . Hypertension   . Coronary artery disease   . CAD (coronary artery disease), severe needing CABG 01/19/2013  . S/P CABG x 3 01/19/2013  . HTN (hypertension) 01/19/2013  . Hyperlipidemia LDL goal < 70 01/19/2013  . Chest pain     2D ECHO, 06/16/2012 - EF 50-55%, borderline low left ventricular systolic function  . Dyspnea  on exertion     LEXISCAN, 06/17/2012 - fixed inferior bowel artifact and basal septal defect, probably related to IVCD/incomplete LBBB, no reversible ischemia, post-stress EF 66%  . History of AAA (abdominal aortic aneurysm) repair     ABDOMINAL AORTIC DOPPLER, 06/16/2013 - normal    Past Surgical History  Procedure Laterality Date  . Vascular surgery    . Coronary artery bypass graft N/A 01/15/2013    Procedure: Coronary Artery Bypass Graft times three using Right Greater Saphenous Vein Graft harvested endoscopically and Left Internal Mammary Artery and;  Surgeon: Gaye Pollack, MD;  Location: [facility];  Service: Open Heart Surgery;  Laterality: N/A;  . Intraoperative transesophageal echocardiogram  01/15/2013    Procedure: INTRAOPERATIVE TRANSESOPHAGEAL ECHOCARDIOGRAM;  Surgeon: Gaye Pollack, MD;  Location: [facility] OR;  Service: Open Heart Surgery;;  . Cardiac catheterization  01/15/2013    Severe coronary obstructive disease with 60-70% ostial left main and 90-85% distal LAD stenosis and mild-moderate stenoses in RCA, recommended for CABG  . Cardioversion N/A 11/12/2013    Procedure: CARDIOVERSION;  Surgeon: Troy Sine, MD;  Location: [facility] ENDOSCOPY;  Service: Cardiovascular;  Laterality: N/A;  11:16 Lido 49m, Propofol 1243mIV 11:19 synch 100 joules conversion conversion to NSR....  . Left heart catheterization with coronary angiogram N/A 01/15/2013    Procedure: LEFT HEART CATHETERIZATION WITH CORONARY ANGIOGRAM;  Surgeon: ThTroy SineMD;  [facility];  Service: Cardiovascular;  Laterality: N/A;    Allergies  Allergen Reactions  . Penicillins Rash    Current Outpatient Prescriptions  Medication Sig Dispense Refill  . aspirin 81 MG tablet Take 81 mg by mouth daily.    . Marland Kitchentorvastatin (LIPITOR) 20 MG tablet Take 1 tablet (20 mg total) by mouth daily. 90 tablet 3  . Coenzyme Q10 Liposomal 100 MG/ML LIQD Take 100 mg by mouth daily.    . Marland Kitchenzetimibe (ZETIA) 10 MG tablet Take 1 tablet  (10 mg total) by mouth daily. 30 tablet 9  . fish oil-omega-3 fatty acids 1000 MG capsule Take 1 g by mouth daily.    . metFORMIN (GLUCOPHAGE) 500 MG tablet Take 500 mg by mouth daily.    . metoprolol succinate (TOPROL-XL) 25 MG 24 hr tablet Take 1.5 tablets (37.5 mg total) by mouth daily. 45 tablet 6  . Multiple Vitamin (MULTIVITAMIN WITH MINERALS) TABS Take 1 tablet by mouth daily.    . rivaroxaban (XARELTO) 20 MG TABS tablet Take 1 tablet (20 mg total) by mouth daily with supper. 30 tablet 6  . valsartan (DIOVAN) 160 MG tablet Take 1 tablet (160 mg total) by mouth daily. 90 tablet 3   No current facility-administered medications for this visit.    Socially he is re-married. His first wife of 42 years passed away. He previously had smoked cigars prior to his bypass surgery but has not had any tobacco use since.  ROS General: Negative; No fevers, chills, or night sweats;  HEENT:  Negative; No changes in vision or hearing, sinus congestion, difficulty swallowing Pulmonary: URI symptoms during the winter; No cough, wheezing, shortness of breath, hemoptysis Cardiovascular: Mild shortness of breath with uphill walking; see history of present illness GI: Negative; No nausea, vomiting, diarrhea, or abdominal pain GU: Negative; No dysuria, hematuria, or difficulty voiding Musculoskeletal: Negative; no myalgias, joint pain, or weakness Hematologic/Oncology: Negative; no easy bruising, bleeding Endocrine: Negative; no heat/cold intolerance; no diabetes Neuro: Negative; no changes in balance, headaches Skin: Removal of small left forearm melanoma. Psychiatric: Negative; No behavioral problems, depression Sleep: Negative; No snoring, daytime sleepiness, hypersomnolence, bruxism, restless legs, hypnogognic hallucinations, no cataplexy Other comprehensive 14 point system review is negative.   PE BP 144/70 mmHg  Pulse 75  Ht 6' 1.5" (1.867 m)  Wt 244 lb 12.8 oz (111.041 kg)  BMI 31.86 kg/m2    Repeat blood pressure by me was 138/70  Wt Readings from Last 3 Encounters:  03/02/15 244 lb 12.8 oz (111.041 kg)  06/23/14 251 lb 4.8 oz (113.989 kg)  03/18/14 242 lb (109.77 kg)    General: Alert, oriented, no distress.  Skin: normal turgor, no rashes HEENT: Normocephalic, atraumatic. Pupils round and reactive; sclera anicteric;no lid lag.  Nose without nasal septal hypertrophy Mouth/Parynx benign; Mallinpatti scale 2 Neck: No JVD, no carotid bruits with normal carotid upstrokes Lungs: clear to ausculatation and percussion; no wheezing or rales Chest wall: No tenderness to palpation Heart: PMI not displaced; regular rate and rhythm. s1 s2 normal  1-7/4 systolic murmur ; no S3. No rub. Abdomen: soft, nontender; no hepatosplenomehaly, BS+; abdominal aorta nontender and not dilated by palpation. Back: No CVA tenderness Pulses 2+ Extremities: no clubbing cyanosis or edema, Homan's sign negative  Neurologic: grossly nonfocal; cranial nerves grossly intact Psychological: Normal affect and mood  ECG (independently read by me): Sinus rhythm at 75 bpm.  Occasional unifocal PVCs.  Nonspecific interventricular block.  ECG (independently read by me): Normal sinus rhythm at 67 beats per minute.  First degree A-V block with PR interval at 290 ms.  QTc interval 439 ms.  No ST segment changes.  Prior June 2015 ECG (independently read by me): Normal sinus rhythm at 68 beats per minute with first degree AV block with a PR interval at 284 ms.  QTc interval 435 ms.  No significant ST-T changes.  T-wave inversion in aVL  ECG today (independently read by me):  Normal sinus rhythm at 71 beats per minute. First create a block with PR interval at 240 ms. QTc interval normal at 4-5 ms.  Prior 10/27/13 ECG (independently read by me): Atrial flutter with variable rate but predominantly 4-1 with occasional 3-1 block; average heart rate 70 beats per minute  Prior ECG from 06/04/2013: Sinus rhythm with  first-degree AV block. Nonspecific interventricular block  LABS: BMP Latest Ref Rng 03/07/2014 11/08/2013 10/12/2013  Glucose 70 - 99 mg/dL 105(H) 108(H) 100(H)  BUN 6 - 23 mg/dL _0 Creatinine 0.50 - 1.35 mg/dL 1.08 0.96 1.02  Sodium 135 - 145 mEq/L 137 140 140  Potassium 3.5 - 5.3 mEq/L 4.4 4.1 4.4  Chloride 96 - 112 mEq/L 103 105 102  CO2 19 - 32 mEq/L _1 Calcium 8.4 - 10.5 mg/dL 9.5 9.2 9.1   Hepatic Function Latest Ref Rng 03/07/2014 10/12/2013 06/14/2013  Total Protein 6.0 - 8.3 g/dL 6.8 6.4 6.9  Albumin 3.5 - 5.2 g/dL 4.2 4.0 4.0  AST 0 - 37 U/L _2 ALT  0 - 53 U/L _0 Alk Phosphatase 39 - 117 U/L 59 60 60  Total Bilirubin 0.2 - 1.2 mg/dL 0.7 0.7 0.7   CBC Latest Ref Rng 03/07/2014 11/08/2013 10/12/2013  WBC 4.0 - 10.5 K/uL 7.7 9.2 8.7  Hemoglobin 13.0 - 17.0 g/dL 12.6(L) 13.3 13.7  Hematocrit 39.0 - 52.0 % 38.2(L) 39.6 41.7  Platelets 150 - 400 K/uL 279 326 314   Lab Results  Component Value Date   MCV 82.0 03/07/2014   MCV 83.7 11/08/2013   MCV 86.9 10/12/2013   Lab Results  Component Value Date   TSH 2.648 03/07/2014   Lipid Panel     Component Value Date/Time   CHOL 130 03/07/2014 0858   TRIG 138 03/07/2014 0858   HDL 43 03/07/2014 0858   LDLCALC 59 03/07/2014 0858       RADIOLOGY: No results found.    ASSESSMENT AND PLAN: Derek Wells  is 2 years status post emergency CABG revascularization surgery after cardiac catheterization demonstrated severe life threatening coronary anatomy.  He has noticed a vague left chest sensation associated with mild shortness of breath.  This typically does not occur while he is at cardiac rehabilitation.  He  is in the maintenance phase of cardiac rehabilitation. He  developed atrial flutter in January and was started on anticoagulation with Xarelto. He is now status post successful DC cardioversion and he is maintaining normal sinus rhythm with first degree AV block.  I have recommended continuation of  anticoagulation, particularly with the age greater than 47 years old, hypertension, diabetes, and peripheral vascular disease.  One year ago.  A nuclear stress test and  echo Doppler study were done when he had similar complaints..  He developed transient left bundle branch block during exercise testing.  There was no area of ischemia.  He did not develop any wall motion abnormality.  Presently, I'm electing to increase his beta blocker therapy and will titrate his Toprol-XL from 25 mg initially to 37.5 mg daily.  He will monitor his resting pulse and symptoms.  It may be possible to further titrate this to 50 mg daily depending upon his response.  His blood pressure today is stable and continues to do well with valsartan 160 mg daily.  He has been on combination therapy with atorvastatin 20 mg and Zetia 10 mg for hyperlipidemia.  He has mentioned the possibility of getting off Zetia and if so, a dose equivalent atorvastatin would be approximately 80 mg.  However, in the past.  He did not tolerate this high dose.  He continues to be on anticoagulation with his history of PAF and elevated chads 2Vasc score.  Fasting laboratory will be obtained.  I have recommended that he also undergo a follow-up duplex study to reassess his abdominal aortic aneurysm repair.  I will see him in the office in 3 months for follow-up evaluation.  Troy Sine, MD, [facility]  03/03/2015 7:53 AM

## 2015-03-04 LAB — HEMOGLOBIN A1C
Hgb A1c MFr Bld: 6.2 % — ABNORMAL HIGH (ref <5.7)
Mean Plasma Glucose: 131 mg/dL — ABNORMAL HIGH (ref <117)

## 2015-03-04 LAB — TSH: TSH: 2.103 u[IU]/mL (ref 0.350–4.500)

## 2015-03-06 ENCOUNTER — Encounter (HOSPITAL_COMMUNITY): Payer: Self-pay

## 2015-03-06 LAB — NMR LIPOPROFILE WITH LIPIDS
Cholesterol, Total: 111 mg/dL (ref 100–199)
HDL Particle Number: 29 umol/L — ABNORMAL LOW (ref >=30.5)
HDL Size: 9.4 nm (ref >=9.2)
HDL-C: 41 mg/dL (ref >39)
LARGE HDL: 4.9 umol/L (ref >=4.8)
LARGE VLDL-P: 3.1 nmol/L — AB (ref <=2.7)
LDL (calc): 52 mg/dL (ref 0–99)
LDL Particle Number: 757 nmol/L (ref <1000)
LDL SIZE: 20.5 nm (ref >=20.8)
LP-IR Score: 46 — ABNORMAL HIGH (ref <=45)
Small LDL Particle Number: 479 nmol/L (ref <=527)
Triglycerides: 90 mg/dL (ref 0–149)
VLDL SIZE: 44.5 nm (ref <=46.6)

## 2015-03-07 ENCOUNTER — Telehealth: Payer: Self-pay | Admitting: *Deleted

## 2015-03-07 DIAGNOSIS — D509 Iron deficiency anemia, unspecified: Secondary | ICD-10-CM

## 2015-03-07 NOTE — Telephone Encounter (Signed)
-----   Message from Lennette Bihari, MD sent at 03/07/2015  2:32 PM EDT ----- Labs much better x microcytic anemia is new.  Check stoll guiacs and ferritin, Fe/TIBC and f/u cbc. Send labs to primary MD

## 2015-03-07 NOTE — Telephone Encounter (Signed)
Notified patient of lab results and recommendations. requested labs ordered.

## 2015-03-08 ENCOUNTER — Encounter (HOSPITAL_COMMUNITY): Payer: Self-pay

## 2015-03-08 LAB — CBC
HCT: 32.7 % — ABNORMAL LOW (ref 39.0–52.0)
Hemoglobin: 10 g/dL — ABNORMAL LOW (ref 13.0–17.0)
MCH: 23.8 pg — AB (ref 26.0–34.0)
MCHC: 30.6 g/dL (ref 30.0–36.0)
MCV: 77.9 fL — ABNORMAL LOW (ref 78.0–100.0)
MPV: 9.9 fL (ref 8.6–12.4)
PLATELETS: 345 10*3/uL (ref 150–400)
RBC: 4.2 MIL/uL — ABNORMAL LOW (ref 4.22–5.81)
RDW: 16.7 % — ABNORMAL HIGH (ref 11.5–15.5)
WBC: 8.5 10*3/uL (ref 4.0–10.5)

## 2015-03-08 LAB — IRON AND TIBC
%SAT: 6 % — ABNORMAL LOW (ref 20–55)
Iron: 24 ug/dL — ABNORMAL LOW (ref 42–165)
TIBC: 377 ug/dL (ref 215–435)
UIBC: 353 ug/dL (ref 125–400)

## 2015-03-08 LAB — FERRITIN: Ferritin: 7 ng/mL — ABNORMAL LOW (ref 22–322)

## 2015-03-09 ENCOUNTER — Other Ambulatory Visit: Payer: Self-pay

## 2015-03-09 DIAGNOSIS — D509 Iron deficiency anemia, unspecified: Secondary | ICD-10-CM

## 2015-03-09 MED ORDER — POLYSACCHARIDE IRON COMPLEX 150 MG PO CAPS: 150.0000 mg | ORAL_CAPSULE | Freq: Two times a day (BID) | ORAL | Status: DC

## 2015-03-10 ENCOUNTER — Encounter: Payer: Self-pay | Admitting: Internal Medicine

## 2015-03-10 ENCOUNTER — Encounter (HOSPITAL_COMMUNITY): Payer: Self-pay

## 2015-03-13 ENCOUNTER — Encounter (HOSPITAL_COMMUNITY): Payer: Self-pay

## 2015-03-15 ENCOUNTER — Encounter (HOSPITAL_COMMUNITY): Payer: Self-pay

## 2015-03-17 ENCOUNTER — Encounter (HOSPITAL_COMMUNITY): Payer: Self-pay

## 2015-03-20 ENCOUNTER — Encounter (HOSPITAL_COMMUNITY)
Admission: RE | Admit: 2015-03-20 | Discharge: 2015-03-20 | Disposition: A | Discharge status: Home / Self Care | Payer: Self-pay | Source: Ambulatory Visit | Attending: Cardiovascular Disease | Admitting: Cardiovascular Disease

## 2015-03-22 ENCOUNTER — Encounter (HOSPITAL_COMMUNITY)
Admission: RE | Admit: 2015-03-22 | Discharge: 2015-03-22 | Disposition: A | Discharge status: Home / Self Care | Payer: Self-pay | Source: Ambulatory Visit | Attending: Cardiovascular Disease | Admitting: Cardiovascular Disease

## 2015-03-24 ENCOUNTER — Encounter (HOSPITAL_COMMUNITY)
Admission: RE | Admit: 2015-03-24 | Discharge: 2015-03-24 | Disposition: A | Discharge status: Home / Self Care | Payer: Self-pay | Source: Ambulatory Visit | Attending: Cardiovascular Disease | Admitting: Cardiovascular Disease

## 2015-03-24 DIAGNOSIS — Z87891 Personal history of nicotine dependence: Secondary | ICD-10-CM | POA: Insufficient documentation

## 2015-03-24 DIAGNOSIS — R0609 Other forms of dyspnea: Secondary | ICD-10-CM | POA: Insufficient documentation

## 2015-03-24 DIAGNOSIS — Z951 Presence of aortocoronary bypass graft: Secondary | ICD-10-CM | POA: Insufficient documentation

## 2015-03-24 DIAGNOSIS — I4892 Unspecified atrial flutter: Secondary | ICD-10-CM | POA: Insufficient documentation

## 2015-03-24 DIAGNOSIS — I714 Abdominal aortic aneurysm, without rupture: Secondary | ICD-10-CM | POA: Insufficient documentation

## 2015-03-24 DIAGNOSIS — I10 Essential (primary) hypertension: Secondary | ICD-10-CM | POA: Insufficient documentation

## 2015-03-24 DIAGNOSIS — I2510 Atherosclerotic heart disease of native coronary artery without angina pectoris: Secondary | ICD-10-CM | POA: Insufficient documentation

## 2015-03-24 DIAGNOSIS — E785 Hyperlipidemia, unspecified: Secondary | ICD-10-CM | POA: Insufficient documentation

## 2015-03-24 DIAGNOSIS — Z9889 Other specified postprocedural states: Secondary | ICD-10-CM | POA: Insufficient documentation

## 2015-03-29 ENCOUNTER — Encounter (HOSPITAL_COMMUNITY)
Admission: RE | Admit: 2015-03-29 | Discharge: 2015-03-29 | Disposition: A | Discharge status: Home / Self Care | Payer: Self-pay | Source: Ambulatory Visit | Attending: Cardiovascular Disease | Admitting: Cardiovascular Disease

## 2015-03-31 ENCOUNTER — Encounter (HOSPITAL_COMMUNITY)
Admission: RE | Admit: 2015-03-31 | Discharge: 2015-03-31 | Disposition: A | Discharge status: Home / Self Care | Payer: Self-pay | Source: Ambulatory Visit | Attending: Cardiovascular Disease | Admitting: Cardiovascular Disease

## 2015-04-03 ENCOUNTER — Encounter (HOSPITAL_COMMUNITY)
Admission: RE | Admit: 2015-04-03 | Discharge: 2015-04-03 | Disposition: A | Discharge status: Home / Self Care | Payer: Self-pay | Source: Ambulatory Visit | Attending: Cardiovascular Disease | Admitting: Cardiovascular Disease

## 2015-04-05 ENCOUNTER — Encounter (HOSPITAL_COMMUNITY): Payer: Self-pay

## 2015-04-07 ENCOUNTER — Encounter (HOSPITAL_COMMUNITY)
Admission: RE | Admit: 2015-04-07 | Discharge: 2015-04-07 | Disposition: A | Discharge status: Home / Self Care | Payer: Self-pay | Source: Ambulatory Visit | Attending: Cardiovascular Disease | Admitting: Cardiovascular Disease

## 2015-04-10 ENCOUNTER — Encounter (HOSPITAL_COMMUNITY): Payer: Self-pay

## 2015-04-12 ENCOUNTER — Encounter (HOSPITAL_COMMUNITY): Payer: Self-pay

## 2015-04-14 ENCOUNTER — Encounter (HOSPITAL_COMMUNITY)
Admission: RE | Admit: 2015-04-14 | Discharge: 2015-04-14 | Disposition: A | Discharge status: Home / Self Care | Payer: Self-pay | Source: Ambulatory Visit | Attending: Cardiovascular Disease | Admitting: Cardiovascular Disease

## 2015-04-17 ENCOUNTER — Encounter (HOSPITAL_COMMUNITY)
Admission: RE | Admit: 2015-04-17 | Discharge: 2015-04-17 | Disposition: A | Discharge status: Home / Self Care | Payer: Self-pay | Source: Ambulatory Visit | Attending: Cardiovascular Disease | Admitting: Cardiovascular Disease

## 2015-04-19 ENCOUNTER — Encounter (HOSPITAL_COMMUNITY)
Admission: RE | Admit: 2015-04-19 | Discharge: 2015-04-19 | Disposition: A | Discharge status: Home / Self Care | Payer: Self-pay | Source: Ambulatory Visit | Attending: Cardiovascular Disease | Admitting: Cardiovascular Disease

## 2015-04-21 ENCOUNTER — Encounter (HOSPITAL_COMMUNITY): Payer: Self-pay

## 2015-04-24 ENCOUNTER — Encounter (HOSPITAL_COMMUNITY)
Admission: RE | Admit: 2015-04-24 | Discharge: 2015-04-24 | Disposition: A | Discharge status: Home / Self Care | Payer: Self-pay | Source: Ambulatory Visit | Attending: Cardiovascular Disease | Admitting: Cardiovascular Disease

## 2015-04-24 DIAGNOSIS — I714 Abdominal aortic aneurysm, without rupture: Secondary | ICD-10-CM | POA: Insufficient documentation

## 2015-04-24 DIAGNOSIS — R0609 Other forms of dyspnea: Secondary | ICD-10-CM | POA: Insufficient documentation

## 2015-04-24 DIAGNOSIS — I10 Essential (primary) hypertension: Secondary | ICD-10-CM | POA: Insufficient documentation

## 2015-04-24 DIAGNOSIS — E785 Hyperlipidemia, unspecified: Secondary | ICD-10-CM | POA: Insufficient documentation

## 2015-04-24 DIAGNOSIS — I4892 Unspecified atrial flutter: Secondary | ICD-10-CM | POA: Insufficient documentation

## 2015-04-24 DIAGNOSIS — Z951 Presence of aortocoronary bypass graft: Secondary | ICD-10-CM | POA: Insufficient documentation

## 2015-04-24 DIAGNOSIS — Z87891 Personal history of nicotine dependence: Secondary | ICD-10-CM | POA: Insufficient documentation

## 2015-04-24 DIAGNOSIS — I2510 Atherosclerotic heart disease of native coronary artery without angina pectoris: Secondary | ICD-10-CM | POA: Insufficient documentation

## 2015-04-24 DIAGNOSIS — Z9889 Other specified postprocedural states: Secondary | ICD-10-CM | POA: Insufficient documentation

## 2015-04-26 ENCOUNTER — Encounter (HOSPITAL_COMMUNITY)
Admission: RE | Admit: 2015-04-26 | Discharge: 2015-04-26 | Disposition: A | Discharge status: Home / Self Care | Payer: Self-pay | Source: Ambulatory Visit | Attending: Cardiovascular Disease | Admitting: Cardiovascular Disease

## 2015-04-27 ENCOUNTER — Ambulatory Visit: Payer: Self-pay | Admitting: Cardiovascular Disease

## 2015-04-28 ENCOUNTER — Encounter (HOSPITAL_COMMUNITY): Payer: Self-pay

## 2015-05-01 ENCOUNTER — Encounter (HOSPITAL_COMMUNITY): Payer: Self-pay

## 2015-05-03 ENCOUNTER — Encounter (HOSPITAL_COMMUNITY): Payer: Self-pay

## 2015-05-05 ENCOUNTER — Encounter (HOSPITAL_COMMUNITY): Payer: Self-pay

## 2015-05-08 ENCOUNTER — Encounter (HOSPITAL_COMMUNITY): Payer: Self-pay

## 2015-05-10 ENCOUNTER — Encounter (HOSPITAL_COMMUNITY): Payer: Self-pay

## 2015-05-12 ENCOUNTER — Encounter (HOSPITAL_COMMUNITY)
Admission: RE | Admit: 2015-05-12 | Discharge: 2015-05-12 | Disposition: A | Discharge status: Home / Self Care | Payer: Self-pay | Source: Ambulatory Visit | Attending: Cardiovascular Disease | Admitting: Cardiovascular Disease

## 2015-05-15 ENCOUNTER — Encounter (HOSPITAL_COMMUNITY)
Admission: RE | Admit: 2015-05-15 | Discharge: 2015-05-15 | Disposition: A | Discharge status: Home / Self Care | Payer: Self-pay | Source: Ambulatory Visit | Attending: Cardiovascular Disease | Admitting: Cardiovascular Disease

## 2015-05-17 ENCOUNTER — Encounter (HOSPITAL_COMMUNITY)
Admission: RE | Admit: 2015-05-17 | Discharge: 2015-05-17 | Disposition: A | Discharge status: Home / Self Care | Payer: Self-pay | Source: Ambulatory Visit | Attending: Cardiovascular Disease | Admitting: Cardiovascular Disease

## 2015-05-19 ENCOUNTER — Ambulatory Visit: Payer: Medicare Other | Admitting: Internal Medicine

## 2015-05-19 ENCOUNTER — Encounter (HOSPITAL_COMMUNITY): Payer: Self-pay

## 2015-05-22 ENCOUNTER — Encounter (HOSPITAL_COMMUNITY)
Admission: RE | Admit: 2015-05-22 | Discharge: 2015-05-22 | Disposition: A | Discharge status: Home / Self Care | Payer: Self-pay | Source: Ambulatory Visit | Attending: Cardiovascular Disease | Admitting: Cardiovascular Disease

## 2015-05-24 ENCOUNTER — Encounter (HOSPITAL_COMMUNITY)
Admission: RE | Admit: 2015-05-24 | Discharge: 2015-05-24 | Disposition: A | Discharge status: Home / Self Care | Payer: Self-pay | Source: Ambulatory Visit | Attending: Cardiovascular Disease | Admitting: Cardiovascular Disease

## 2015-05-26 ENCOUNTER — Encounter (HOSPITAL_COMMUNITY): Payer: Medicare Other

## 2015-05-26 DIAGNOSIS — Z87891 Personal history of nicotine dependence: Secondary | ICD-10-CM | POA: Insufficient documentation

## 2015-05-26 DIAGNOSIS — E785 Hyperlipidemia, unspecified: Secondary | ICD-10-CM | POA: Insufficient documentation

## 2015-05-26 DIAGNOSIS — I4892 Unspecified atrial flutter: Secondary | ICD-10-CM | POA: Insufficient documentation

## 2015-05-26 DIAGNOSIS — Z9889 Other specified postprocedural states: Secondary | ICD-10-CM | POA: Insufficient documentation

## 2015-05-26 DIAGNOSIS — I2510 Atherosclerotic heart disease of native coronary artery without angina pectoris: Secondary | ICD-10-CM | POA: Insufficient documentation

## 2015-05-26 DIAGNOSIS — I714 Abdominal aortic aneurysm, without rupture: Secondary | ICD-10-CM | POA: Insufficient documentation

## 2015-05-26 DIAGNOSIS — R0609 Other forms of dyspnea: Secondary | ICD-10-CM | POA: Insufficient documentation

## 2015-05-26 DIAGNOSIS — I10 Essential (primary) hypertension: Secondary | ICD-10-CM | POA: Insufficient documentation

## 2015-05-26 DIAGNOSIS — Z951 Presence of aortocoronary bypass graft: Secondary | ICD-10-CM | POA: Insufficient documentation

## 2015-05-31 ENCOUNTER — Encounter (HOSPITAL_COMMUNITY)
Admission: RE | Admit: 2015-05-31 | Discharge: 2015-05-31 | Disposition: A | Discharge status: Home / Self Care | Payer: Self-pay | Source: Ambulatory Visit | Attending: Cardiovascular Disease | Admitting: Cardiovascular Disease

## 2015-06-02 ENCOUNTER — Encounter (HOSPITAL_COMMUNITY): Payer: Self-pay

## 2015-06-05 ENCOUNTER — Encounter (HOSPITAL_COMMUNITY)
Admission: RE | Admit: 2015-06-05 | Discharge: 2015-06-05 | Disposition: A | Discharge status: Home / Self Care | Payer: Self-pay | Source: Ambulatory Visit | Attending: Cardiovascular Disease | Admitting: Cardiovascular Disease

## 2015-06-07 ENCOUNTER — Encounter (HOSPITAL_COMMUNITY)
Admission: RE | Admit: 2015-06-07 | Discharge: 2015-06-07 | Disposition: A | Discharge status: Home / Self Care | Payer: Self-pay | Source: Ambulatory Visit | Attending: Cardiovascular Disease | Admitting: Cardiovascular Disease

## 2015-06-09 ENCOUNTER — Ambulatory Visit (HOSPITAL_COMMUNITY)
Admission: RE | Admit: 2015-06-09 | Discharge: 2015-06-09 | Disposition: A | Discharge status: Home / Self Care | Payer: Medicare Other | Source: Ambulatory Visit | Attending: Cardiovascular Disease | Admitting: Cardiovascular Disease

## 2015-06-09 ENCOUNTER — Encounter (HOSPITAL_COMMUNITY): Payer: Self-pay

## 2015-06-09 DIAGNOSIS — I714 Abdominal aortic aneurysm, without rupture, unspecified: Secondary | ICD-10-CM

## 2015-06-09 DIAGNOSIS — F17200 Nicotine dependence, unspecified, uncomplicated: Secondary | ICD-10-CM | POA: Diagnosis not present

## 2015-06-09 DIAGNOSIS — E785 Hyperlipidemia, unspecified: Secondary | ICD-10-CM | POA: Diagnosis not present

## 2015-06-09 DIAGNOSIS — I10 Essential (primary) hypertension: Secondary | ICD-10-CM | POA: Insufficient documentation

## 2015-06-12 ENCOUNTER — Encounter (HOSPITAL_COMMUNITY)
Admission: RE | Admit: 2015-06-12 | Discharge: 2015-06-12 | Disposition: A | Discharge status: Home / Self Care | Payer: Self-pay | Source: Ambulatory Visit | Attending: Cardiovascular Disease | Admitting: Cardiovascular Disease

## 2015-06-13 ENCOUNTER — Other Ambulatory Visit: Payer: Self-pay | Admitting: Cardiovascular Disease

## 2015-06-14 ENCOUNTER — Encounter (HOSPITAL_COMMUNITY)
Admission: RE | Admit: 2015-06-14 | Discharge: 2015-06-14 | Disposition: A | Discharge status: Home / Self Care | Payer: Self-pay | Source: Ambulatory Visit | Attending: Cardiovascular Disease | Admitting: Cardiovascular Disease

## 2015-06-14 IMAGING — CR DG CHEST 2V
4 series · 4 of 4 positions shown · non-contrast
Comparison: 02/10/2013 and 01/18/2013

CLINICAL DATA: Preop hypertension.

EXAM:
CHEST  2 VIEW

[view not recorded (1 of 4)]
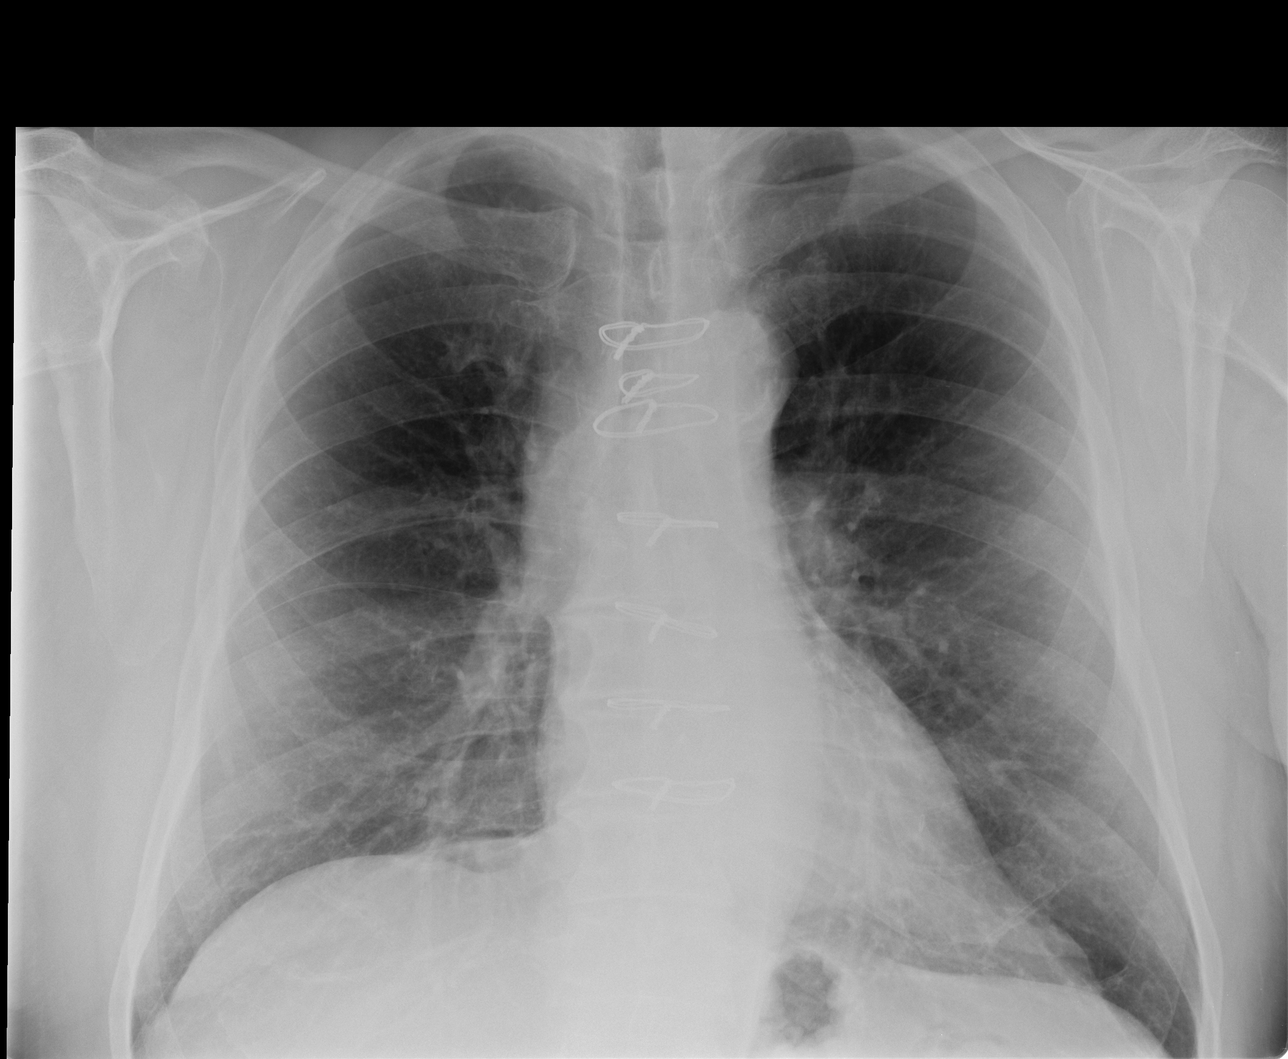

[view not recorded (2 of 4)]
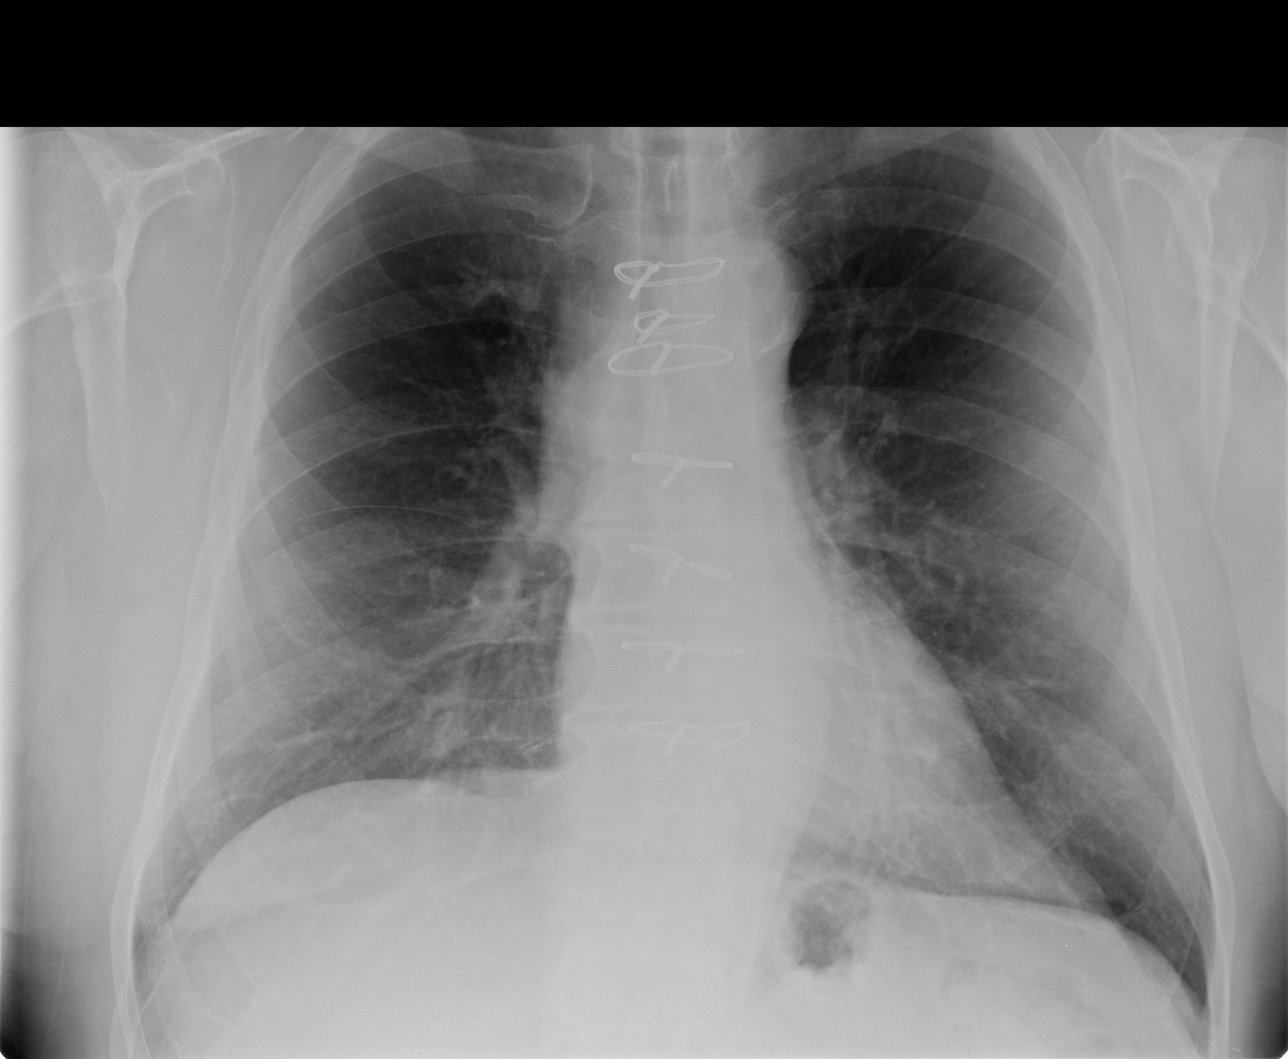

[view not recorded (3 of 4)]
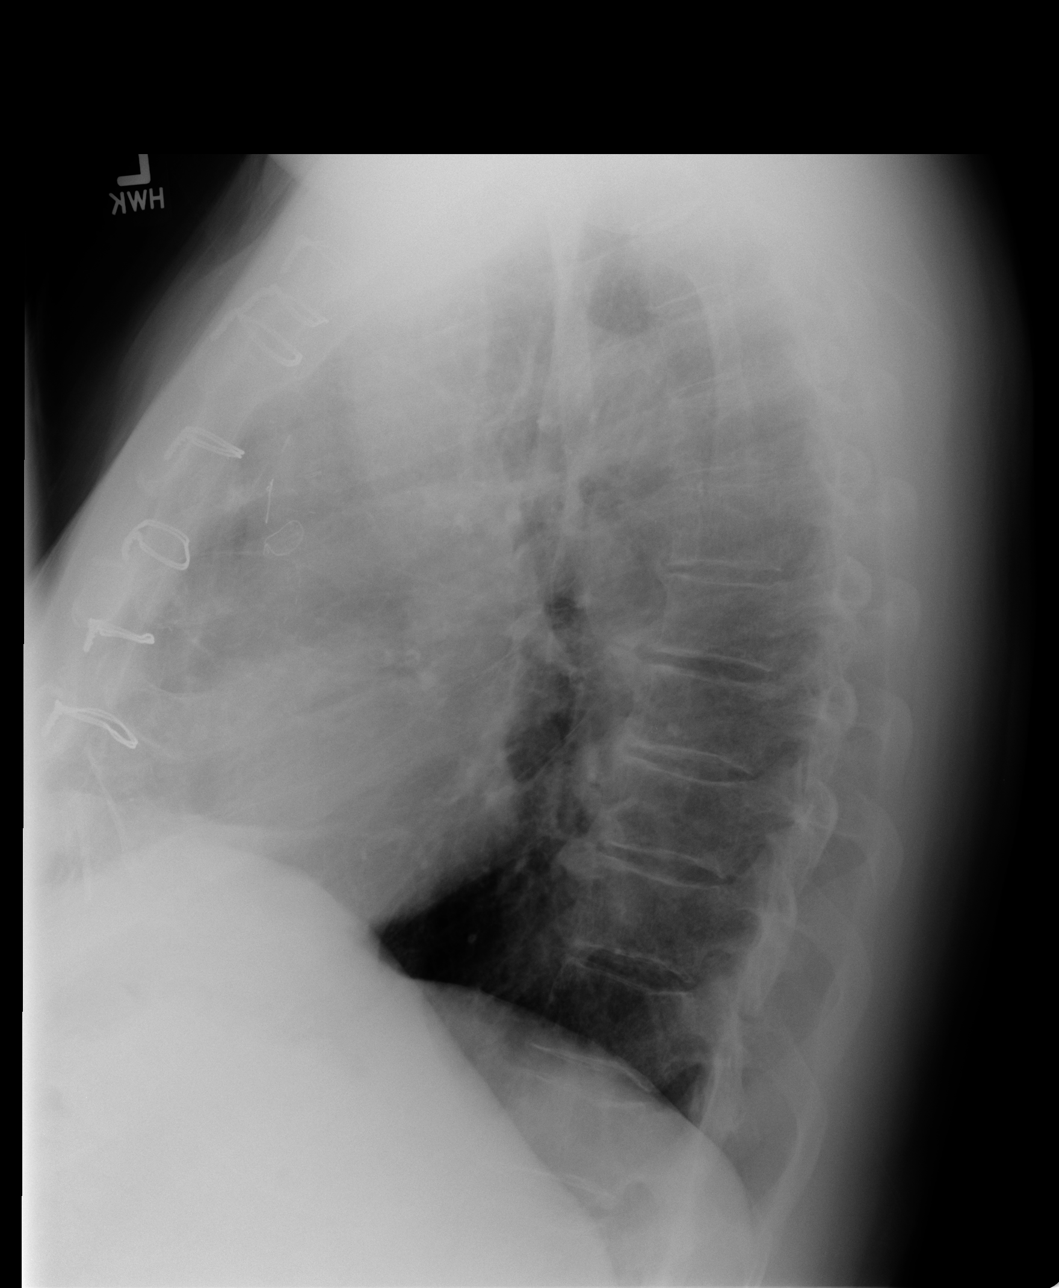

[view not recorded (4 of 4)]
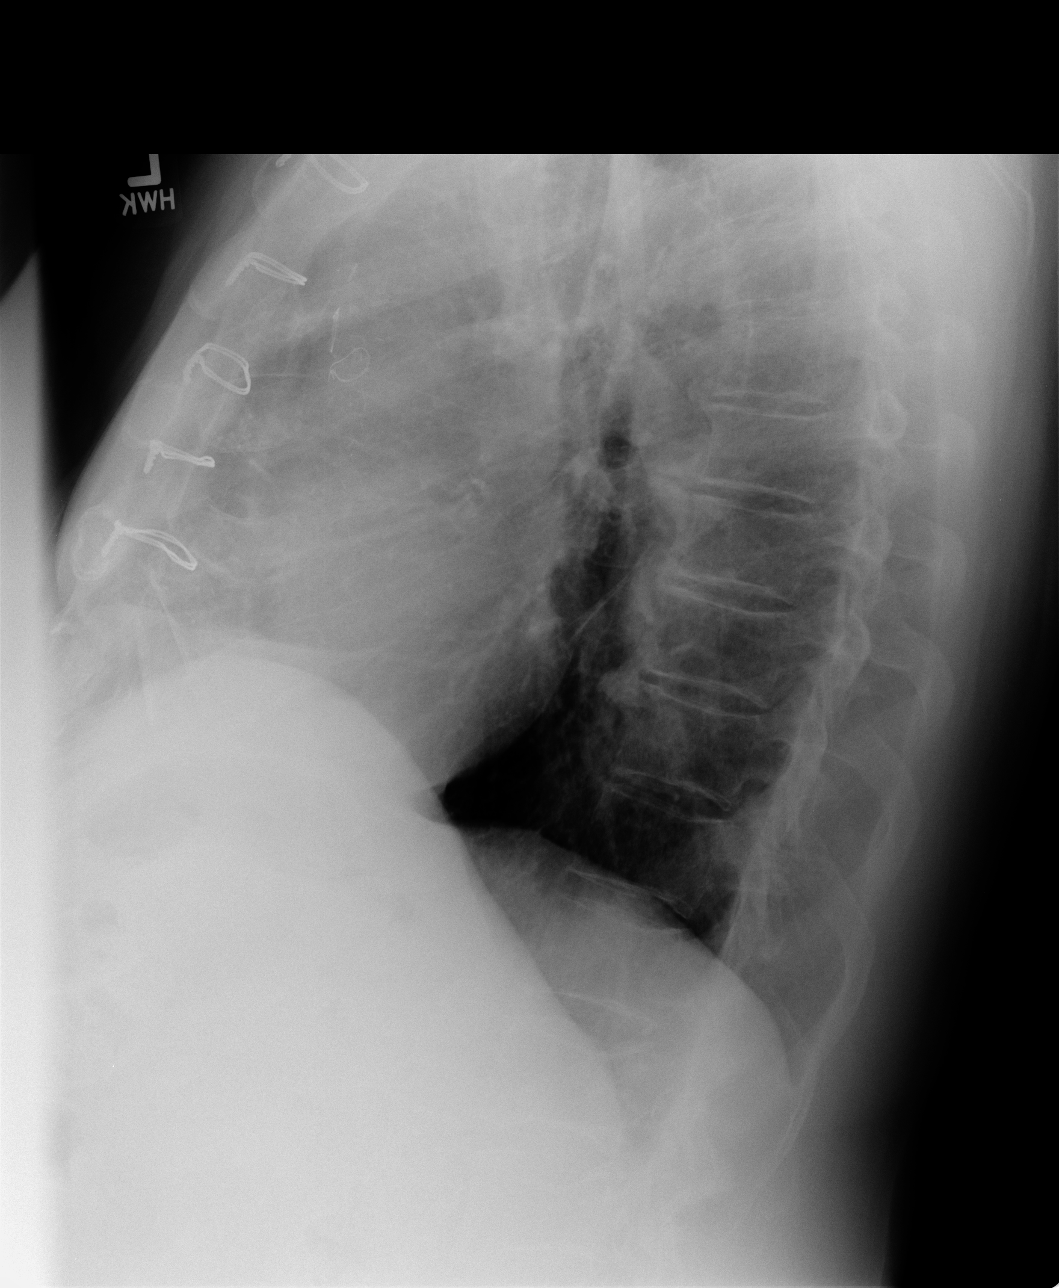

[4 of 4 positions shown; findings below may reference images not displayed]

FINDINGS: Sternotomy wires unchanged. Lungs are adequately inflated without
consolidation or effusion. Cardiomediastinal silhouette is within
normal. There is normal calcified plaque over the thoracic aorta.
There is degenerative change of the spine.
IMPRESSION: No active cardiopulmonary disease.

## 2015-06-14 NOTE — Telephone Encounter (Signed)
REFILL 

## 2015-06-15 ENCOUNTER — Telehealth: Payer: Self-pay | Admitting: Cardiovascular Disease

## 2015-06-15 NOTE — Telephone Encounter (Signed)
Pt wants to know if he should he have lab work before he see Dr Tresa Endo on Monday. Pt says he is anemic,very concerned about this.

## 2015-06-15 NOTE — Telephone Encounter (Signed)
Pt wants to know if he should he have lab work before he see Dr Kelly on Monday. Pt says he is anemic,very concerned about this.  

## 2015-06-16 ENCOUNTER — Encounter (HOSPITAL_COMMUNITY)
Admission: RE | Admit: 2015-06-16 | Discharge: 2015-06-16 | Disposition: A | Discharge status: Home / Self Care | Payer: Self-pay | Source: Ambulatory Visit | Attending: Cardiovascular Disease | Admitting: Cardiovascular Disease

## 2015-06-19 ENCOUNTER — Encounter (HOSPITAL_COMMUNITY)
Admission: RE | Admit: 2015-06-19 | Discharge: 2015-06-19 | Disposition: A | Discharge status: Home / Self Care | Payer: Self-pay | Source: Ambulatory Visit | Attending: Cardiovascular Disease | Admitting: Cardiovascular Disease

## 2015-06-19 ENCOUNTER — Telehealth: Payer: Self-pay | Admitting: Cardiovascular Disease

## 2015-06-19 ENCOUNTER — Encounter: Payer: Self-pay | Admitting: Cardiovascular Disease

## 2015-06-19 ENCOUNTER — Ambulatory Visit (INDEPENDENT_AMBULATORY_CARE_PROVIDER_SITE_OTHER): Payer: Medicare Other | Admitting: Cardiovascular Disease

## 2015-06-19 VITALS — BP 144/64 | HR 66 | Ht 73.5 in | Wt 245.2 lb

## 2015-06-19 DIAGNOSIS — Z9889 Other specified postprocedural states: Secondary | ICD-10-CM

## 2015-06-19 DIAGNOSIS — Z8679 Personal history of other diseases of the circulatory system: Secondary | ICD-10-CM

## 2015-06-19 DIAGNOSIS — D649 Anemia, unspecified: Secondary | ICD-10-CM | POA: Diagnosis not present

## 2015-06-19 DIAGNOSIS — E785 Hyperlipidemia, unspecified: Secondary | ICD-10-CM | POA: Diagnosis not present

## 2015-06-19 DIAGNOSIS — Z23 Encounter for immunization: Secondary | ICD-10-CM

## 2015-06-19 DIAGNOSIS — I25810 Atherosclerosis of coronary artery bypass graft(s) without angina pectoris: Secondary | ICD-10-CM | POA: Diagnosis not present

## 2015-06-19 DIAGNOSIS — Z951 Presence of aortocoronary bypass graft: Secondary | ICD-10-CM

## 2015-06-19 DIAGNOSIS — I4892 Unspecified atrial flutter: Secondary | ICD-10-CM | POA: Diagnosis not present

## 2015-06-19 DIAGNOSIS — D509 Iron deficiency anemia, unspecified: Secondary | ICD-10-CM

## 2015-06-19 DIAGNOSIS — I10 Essential (primary) hypertension: Secondary | ICD-10-CM

## 2015-06-19 NOTE — Telephone Encounter (Signed)
Derek Wells is calling because she is needing a diagnosis for the Cardiac Rehab Order . Please call or fax it over to [phone]  Thanks

## 2015-06-19 NOTE — Telephone Encounter (Signed)
Dialed number listed, it rings through to [facility] - receptionist noted caller is probably currently at lunch.  Will reattempt later today.

## 2015-06-19 NOTE — Telephone Encounter (Signed)
Can assess when seen

## 2015-06-19 NOTE — Telephone Encounter (Signed)
Called Trish back. Gave Dx code z95.1  She requested order sent w/ code attached if possible. Informed her we will figure this out and resent to fax provided.

## 2015-06-19 NOTE — Patient Instructions (Addendum)
Your physician recommends that you return for  Fasting lab work.  Your physician wants you to follow-up in: 6 months or sooner if needed. You will receive a reminder letter in the mail two months in advance. If you don't receive a letter, please call our office to schedule the follow-up appointment.  Your physician has recommended you make the following change in your medication: the atorvastatin has been increased to 40 mg daily.   Influenza Vaccine (Flu Vaccine, Inactivated or Recombinant)  What You Need to Know 1. Why get vaccinated? Influenza ("flu") is a contagious disease that spreads around the [place] every winter, usually between October and May. Flu is caused by influenza viruses, and is spread mainly by coughing, sneezing, and close contact. Anyone can get flu, but the risk of getting flu is highest among children. Symptoms come on suddenly and may last several days. They can include:  fever/chills  sore throat  muscle aches  fatigue  cough  headache  runny or stuffy nose Flu can make some people much sicker than others. These people include young children, people 13 and older, pregnant women, and people with certain health conditions-such as heart, lung or kidney disease, nervous system disorders, or a weakened immune system. Flu vaccination is especially important for these people, and anyone in close contact with them. Flu can also lead to pneumonia, and make existing medical conditions worse. It can cause diarrhea and seizures in children. Each year thousands of people in the [place] die from flu, and many more are hospitalized. Flu vaccine is the best protection against flu and its complications. Flu vaccine also helps prevent spreading flu from person to person. 2. Inactivated and recombinant flu vaccines You are getting an injectable flu vaccine, which is either an "inactivated" or "recombinant" vaccine. These vaccines do not contain any live influenza  virus. They are given by injection with a needle, and often called the "flu shot."  A different live, attenuated (weakened) influenza vaccine is sprayed into the nostrils. This vaccine is described in a separate Vaccine Information Statement. Flu vaccination is recommended every year. Some children 6 months through 35 years of age might need two doses during one year. Flu viruses are always changing. Each year's flu vaccine is made to protect against 3 or 4 viruses that are likely to cause disease that year. Flu vaccine cannot prevent all cases of flu, but it is the best defense against the disease.  It takes about 2 weeks for protection to develop after the vaccination, and protection lasts several months to a year. Some illnesses that are not caused by influenza virus are often mistaken for flu. Flu vaccine will not prevent these illnesses. It can only prevent influenza. Some inactivated flu vaccine contains a very small amount of a mercury-based preservative called thimerosal. Studies have shown that thimerosal in vaccines is not harmful, but flu vaccines that do not contain a preservative are available. 3. Some people should not get this vaccine Tell the person who gives you the vaccine:  If you have any severe, life-threatening allergies. If you ever had a life-threatening allergic reaction after a dose of flu vaccine, or have a severe allergy to any part of this vaccine, including (for example) an allergy to gelatin, antibiotics, or eggs, you may be advised not to get vaccinated. Most, but not all, types of flu vaccine contain a small amount of egg protein.  If you ever had Guillain-Barr Syndrome (a severe paralyzing illness, also called GBS).  Some people with a history of GBS should not get this vaccine. This should be discussed with your doctor.  If you are not feeling well. It is usually okay to get flu vaccine when you have a mild illness, but you might be advised to wait until you feel  better. You should come back when you are better. 4. Risks of a vaccine reaction With a vaccine, like any medicine, there is a chance of side effects. These are usually mild and go away on their own. Problems that could happen after any vaccine:  Brief fainting spells can happen after any medical procedure, including vaccination. Sitting or lying down for about 15 minutes can help prevent fainting, and injuries caused by a fall. Tell your doctor if you feel dizzy, or have vision changes or ringing in the ears.  Severe shoulder pain and reduced range of motion in the arm where a shot was given can happen, very rarely, after a vaccination.  Severe allergic reactions from a vaccine are very rare, estimated at less than 1 in a million doses. If one were to occur, it would usually be within a few minutes to a few hours after the vaccination. Mild problems following inactivated flu vaccine:  soreness, redness, or swelling where the shot was given  hoarseness  sore, red or itchy eyes  cough  fever  aches  headache  itching  fatigue If these problems occur, they usually begin soon after the shot and last 1 or 2 days. Moderate problems following inactivated flu vaccine:  Young children who get inactivated flu vaccine and pneumococcal vaccine (PCV13) at the same time may be at increased risk for seizures caused by fever. Ask your doctor for more information. Tell your doctor if a child who is getting flu vaccine has ever had a seizure. Inactivated flu vaccine does not contain live flu virus, so you cannot get the flu from this vaccine. As with any medicine, there is a very remote chance of a vaccine causing a serious injury or death. The safety of vaccines is always being monitored. For more information, visit: http://floyd.org/ 5. What if there is a serious reaction? What should I look for?  Look for anything that concerns you, such as signs of a severe allergic reaction, very  high fever, or behavior changes. Signs of a severe allergic reaction can include hives, swelling of the face and throat, difficulty breathing, a fast heartbeat, dizziness, and weakness. These would start a few minutes to a few hours after the vaccination. What should I do?  If you think it is a severe allergic reaction or other emergency that can't wait, call 9-1-1 and get the person to the nearest hospital. Otherwise, call your doctor.  Afterward, the reaction should be reported to the Vaccine Adverse Event Reporting System (VAERS). Your doctor should file this report, or you can do it yourself through the VAERS web site at www.vaers.LAgents.no, or by calling 1-614-716-7473. VAERS does not give medical advice. 6. The National Vaccine Injury Compensation Program The [employer] Vaccine Injury Compensation Program (VICP) is a federal program that was created to compensate people who may have been injured by certain vaccines. Persons who believe they may have been injured by a vaccine can learn about the program and about filing a claim by calling 1-(571) 807-9958 or visiting the VICP website at SpiritualWord.at. There is a time limit to file a claim for compensation. 7. How can I learn more?  Ask your health care Kaiel Weide.  Call your  local or state health department.  Contact the Centers for Disease Control and Prevention (CDC):  Call (684) 254-3094 (1-800-CDC-INFO) or  Visit CDC's website at BiotechRoom.com.cy CDC Vaccine Information Statement (Interim) Inactivated Influenza Vaccine (05/11/2013) Document Released: 07/04/2006 Document Revised: 01/24/2014 Document Reviewed: 08/27/2013 [facility] Patient Information 2015 [place], [place]. This information is not intended to replace advice given to you by your health care Geniva Lohnes. Make sure you discuss any questions you have with your health care Frutoso Dimare.

## 2015-06-19 NOTE — Progress Notes (Signed)
Patient ID: Derek Wells, male   DOB: [birth year], 81 y.o.   MRN: [id number]     HPI: Derek Wells is a 81 y.o. male who presents to the office today for a 3 month followup cardiology evaluation   Derek Wells has a history of hypertension, hyperlipidemia, and is s/p abdominal aortic aneurysm repair in April 2010. I had seen him on 01/14/2013 at which time he had experienced symptoms worrisome for unstable angina. Previously, in September 2013 he had undergone a nuclear perfusion study  which was essentially normal although did show mild diaphragmatic attenuation. He also been found to have a atherogenic dyslipidemia pattern with reference to his lipid panel. Catheterization the following day on 01/15/2013 revealed life-threatening coronary anatomy with 60-70% ostial left main stenosis, 90-95% distal left main stenosis, 95-99% ostial LAD stenosis, and mild/moderate stenoses in the RCA. That same day he was successfully operated by Dr. Arvid Right and underwent CABG x3 with a LIMA to the LAD, SVG to the OM, and SVG to the PDA. He had a unremarkable postoperative course and was discharged 4 days later.  He saw Derek Wells on 01/27/13 and was doing well. I saw him in followup on 03/02/2013 and he  participating  in cardiac rehabilitation. When I saw him in September 2014, he was in sinus rhythm and has and had resumed a good level of activity.   In January 2015, he was found to be in atrial flutter at cardiac rehabilitation. He was unaware of his heart rhythm being abnormal. He had developed a URI over the holidays prior to that and had just completed a steroid dose pack. Xarelto 20 mg was added to his medical regimen. He did undergo subsequent blood work on January 20 which showed normal renal function and normal electrolytes. Liver function studies were normal. Hemoglobin was 13.7 hematocrit 41.7. Platelets were normal. TSH was normal at 2.63. Magnesium was normal at 2.0.  He underwent successful cardioversion for  his atrial flutter on 11/12/2013 and he cardioverted to sinus rhythm at 100 J.  Since his cardioversion, he is unaware of any recurrent rhythm disturbance.   He is in the maintenance phase of cardiac rehabilitation.  When I  saw him in June 2015, he told me that he had noticed several episodes of a vague chest sensation particularly when he rolls his garbage can back up a hill at home.    On 03/18/2014, he underwent a nuclear perfusion study, which showed reduced exercise capacity with a workload of only 5.9.  He did experience mild shortness of breath.  There was no significant ST depression, but he had developed transient left bundle branch block.  The scan was interpreted at low as low risk.  Ejection fraction was 65% without wall motion abnormalities.  An echo Doppler study showed an EF of 60-65%.  There were no regional wall motion abnormalities.  He had mild biatrial enlargement.  Derek Wells currently is playing golf at least 2 times per week and occasionally 3.  He is in the maintenance phase of cardiac rehabilitation.  He does admit to some weight gain.  He denies any episodes of chest pain, particularly during the cardiac rehabilitation program.  He has noticed some mild shortness of breath with deep breathing and some vague pressure in his chest which is short-lived walking up hills when he walks fast with his wife.  He has noticed some mild shortness of breath walking up steep hills in the golf [place] without chest pressure.  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   [name], I titrated his Toprol to 37.5 mg daily.  He states he has felt improved on this increased regimen.  He is able to walk on the [place] and also at the beach without any symptomatology that he had experienced previously.  When I had checked lab work from his last office visit.  He was anemic and had significantly reduced iron saturation at 6%, and his serum ferritin was 7.  I  referred him for GI evaluation and he tells me he underwent both colonoscopy and endoscopy by Dr. Laurence Wells: 4 polyps were removed from his colon.  He was not having any active bleeding.  He does note improved energy.  He had taken Niferex initially and now is taking over-the-counter iron. He has more energy.  He is continuing with cardiac rehabilitation.  He underwent a follow-up abdominal ultrasound on 06/09/2015 which showed a patent aortobifemoral bypass graft without focal dilatation or stenosis.  He is concerned about his anemia.  He presents for follow-up evaluation.  Past Medical History  Diagnosis Date  . Hypertension   . Coronary artery disease   . CAD (coronary artery disease), severe needing CABG 01/19/2013  . S/P CABG x 3 01/19/2013  . HTN (hypertension) 01/19/2013  . Hyperlipidemia LDL goal < 70 01/19/2013  . Chest pain     2D ECHO, 06/16/2012 - EF 50-55%, borderline low left ventricular systolic function  . Dyspnea on exertion     LEXISCAN, 06/17/2012 - fixed inferior bowel artifact and basal septal defect, probably related to IVCD/incomplete LBBB, no reversible ischemia, post-stress EF 66%  . History of AAA (abdominal aortic aneurysm) repair     ABDOMINAL AORTIC DOPPLER, 06/16/2013 - normal    Past Surgical History  Procedure Laterality Date  . Vascular surgery    . Coronary artery bypass graft N/A 01/15/2013    Procedure: Coronary Artery Bypass Graft times three using Right Greater Saphenous Vein Graft harvested endoscopically and Left Internal Mammary Artery and;  Surgeon: Gaye Pollack, MD;  Location: [facility];  Service: Open Heart Surgery;  Laterality: N/A;  . Intraoperative transesophageal echocardiogram  01/15/2013    Procedure: INTRAOPERATIVE TRANSESOPHAGEAL ECHOCARDIOGRAM;  Surgeon: Gaye Pollack, MD;  Location: [facility] OR;  Service: Open Heart Surgery;;  . Cardiac catheterization  01/15/2013    Severe coronary obstructive disease with 60-70% ostial left main and 90-85% distal  LAD stenosis and mild-moderate stenoses in RCA, recommended for CABG  . Cardioversion N/A 11/12/2013    Procedure: CARDIOVERSION;  Surgeon: Troy Sine, MD;  Location: [facility] END[facility]COPY;  Service: Cardiovascular;  Laterality: N/A;  11:16 Lido 74m, Propofol 1267mIV 11:19 synch 100 joules conversion conversion to NSR....  . Left heart catheterization with coronary angiogram N/A 01/15/2013    Procedure: LEFT HEART CATHETERIZATION WITH CORONARY ANGIOGRAM;  Surgeon: ThTroy SineMD;  [facility];  Service: Cardiovascular;  Laterality: N/A;    Allergies  Allergen Reactions  . Penicillins Rash    Current Outpatient Prescriptions  Medication Sig Dispense Refill  . aspirin 81 MG tablet Take 81 mg by mouth daily.    . Marland Kitchentorvastatin (LIPITOR) 20 MG tablet take 1 tablet by mouth daily 90 tablet 0  . Coenzyme Q10 Liposomal 100 MG/ML LIQD Take 100 mg by mouth daily.    . Marland Kitchenzetimibe (ZETIA) 10 MG tablet Take 1 tablet (10 mg total) by mouth daily. 30 tablet  9  . fish oil-omega-3 fatty acids 1000 MG capsule Take 1 g by mouth daily.    . iron polysaccharides (NIFEREX) 150 MG capsule Take 1 capsule (150 mg total) by mouth 2 (two) times daily. 60 capsule 3  . metFORMIN (GLUCOPHAGE) 500 MG tablet Take 500 mg by mouth daily.    . metoprolol succinate (TOPROL-XL) 25 MG 24 hr tablet Take 1.5 tablets (37.5 mg total) by mouth daily. 45 tablet 6  . Multiple Vitamin (MULTIVITAMIN WITH MINERALS) TABS Take 1 tablet by mouth daily.    . valsartan (DIOVAN) 160 MG tablet take 1 tablet by mouth daily 90 tablet 0   No current facility-administered medications for this visit.    Socially he is re-married. His first wife of 42 years passed away. He previously had smoked cigars prior to his bypass surgery but has not had any tobacco use since.  He continues to play golf at least 2 times per week.  He does a lot of leg exercise with cardiac rehabilitation.  ROS General: Negative; No fevers, chills, or night sweats;   HEENT: Negative; No changes in vision or hearing, sinus congestion, difficulty swallowing Pulmonary: URI symptoms during the winter; No cough, wheezing, shortness of breath, hemoptysis Cardiovascular: Mild shortness of breath with uphill walking; see history of present illness GI: Negative; No nausea, vomiting, diarrhea, or abdominal pain GU: Negative; No dysuria, hematuria, or difficulty voiding Musculoskeletal: Negative; no myalgias, joint pain, or weakness Hematologic/Oncology: Negative; no easy bruising, bleeding Endocrine: Negative; no heat/cold intolerance; no diabetes Neuro: Negative; no changes in balance, headaches Skin: Removal of small left forearm melanoma. Psychiatric: Negative; No behavioral problems, depression Sleep: Negative; No snoring, daytime sleepiness, hypersomnolence, bruxism, restless legs, hypnogognic hallucinations, no cataplexy Other comprehensive 14 point system review is negative.   PE BP 144/64 mmHg  Pulse 66  Ht 6' 1.5" (1.867 m)  Wt 245 lb 3.2 oz (111.222 kg)  BMI 31.91 kg/m2   Wt Readings from Last 3 Encounters:  06/19/15 245 lb 3.2 oz (111.222 kg)  03/02/15 244 lb 12.8 oz (111.041 kg)  06/23/14 251 lb 4.8 oz (113.989 kg)   General: Alert, oriented, no distress.  Skin: normal turgor, no rashes HEENT: Normocephalic, atraumatic. Pupils round and reactive; sclera anicteric;no lid lag.  Nose without nasal septal hypertrophy Mouth/Parynx benign; Mallinpatti scale 2 Neck: No JVD, no carotid bruits with normal carotid upstrokes Lungs: clear to ausculatation and percussion; no wheezing or rales Chest wall: No tenderness to palpation Heart: PMI not displaced; regular rate and rhythm. s1 s2 normal  4-9/7 systolic murmur ; no S3. No rub. Abdomen: soft, nontender; no hepatosplenomehaly, BS+; abdominal aorta nontender and not dilated by palpation. Back: No CVA tenderness Pulses 2+ Extremities: no clubbing cyanosis or edema, Homan's sign negative    Neurologic: grossly nonfocal; cranial nerves grossly intact Psychological: Normal affect and mood  ECG (independently read by me): Normal sinus rhythm at 66 bpm.  First-degree block with a PR interval at 290 ms.  Nonspecific interventricular block.  03/02/2015 ECG (independently read by me): Sinus rhythm at 75 bpm.  Occasional unifocal PVCs.  Nonspecific interventricular block.  Prior October 2015 ECG (independently read by me): Normal sinus rhythm at 67 beats per minute.  First degree A-V block with PR interval at 290 ms.  QTc interval 439 ms.  No ST segment changes.  Prior June 2015 ECG (independently read by me): Normal sinus rhythm at 68 beats per minute with first degree AV block with a PR interval at  284 ms.  QTc interval 435 ms.  No significant ST-T changes.  T-wave inversion in aVL  ECG today (independently read by me):  Normal sinus rhythm at 71 beats per minute. First create a block with PR interval at 240 ms. QTc interval normal at 4-5 ms.  Prior 10/27/13 ECG (independently read by me): Atrial flutter with variable rate but predominantly 4-1 with occasional 3-1 block; average heart rate 70 beats per minute  Prior ECG from 06/04/2013: Sinus rhythm with first-degree AV block. Nonspecific interventricular block  LABS: BMP Latest Ref Rng 03/01/2015 03/07/2014 11/08/2013  Glucose 70 - 99 mg/dL 107(H) 105(H) 108(H)  BUN 6 - 23 mg/dL '18 17 15  ' Creatinine 0.50 - 1.35 mg/dL 0.93 1.08 0.96  Sodium 135 - 145 mEq/L 143 137 140  Potassium 3.5 - 5.3 mEq/L 4.8 4.4 4.1  Chloride 96 - 112 mEq/L 104 103 105  CO2 19 - 32 mEq/L '24 25 25  ' Calcium 8.4 - 10.5 mg/dL 9.1 9.5 9.2   Hepatic Function Latest Ref Rng 03/01/2015 03/07/2014 10/12/2013  Total Protein 6.0 - 8.3 g/dL 6.5 6.8 6.4  Albumin 3.5 - 5.2 g/dL 3.9 4.2 4.0  AST 0 - 37 U/L '15 18 14  ' ALT 0 - 53 U/L '13 16 16  ' Alk Phosphatase 39 - 117 U/L 56 59 60  Total Bilirubin 0.2 - 1.2 mg/dL 0.7 0.7 0.7   CBC Latest Ref Rng 03/07/2015 03/01/2015 03/07/2014   WBC 4.0 - 10.5 K/uL 8.5 8.1 7.7  Hemoglobin 13.0 - 17.0 g/dL 10.0(L) 9.7(L) 12.6(L)  Hematocrit 39.0 - 52.0 % 32.7(L) 31.5(L) 38.2(L)  Platelets 150 - 400 K/uL 345 309 279   Lab Results  Component Value Date   MCV 77.9* 03/07/2015   MCV 76.3* 03/01/2015   MCV 82.0 03/07/2014   Lab Results  Component Value Date   TSH 2.103 03/01/2015   Lipid Panel     Component Value Date/Time   CHOL 111 03/01/2015 1033   TRIG 90 03/01/2015 1033   HDL 41 03/01/2015 1033   LDLCALC 52 03/01/2015 1033     RADIOLOGY: No results found.    ASSESSMENT AND PLAN: Mr. Bhardwaj is an 81 year old white male who is status post CABG revascularization surgery done emergently  after cardiac catheterization on 01/15/2013 demonstrated severe life threatening coronary anatomy.   He  is in the maintenance phase of cardiac rehabilitation. He  developed atrial flutter in January and was started on anticoagulation with Xarelto. He is now status post successful DC cardioversion and he is maintaining normal sinus rhythm with first degree AV block.  I have recommended continuation of anticoagulation, particularly with the age greater than 63 years old, hypertension, diabetes, and peripheral vascular disease.  His previous episodes of vague chest pain have completely resolved with the increased beta blocker regimen of Toprol 37.5 mg daily.  He has first-degree AV block, which is stable.  He's not having any current anginal symptoms.  He was recently found to be anemic and had significant iron deficiency at 6% with a very low ferritin at 7.  His Xarelto was discontinued.  He is now on aspirin 81 mg.  He underwent complete GI evaluation by Dr. Oletta Lamas and has been on iron replacement.  I am recommending a complete set of blood work be drawn and will include a CBC, iron studies, and ferritin level.  I will also check a B12 and folate to make certain there is no additional deficiency with these parameters if he continues to  be anemic  and no longer microcytic.  Apparently no active bleeding was noted on his GI evaluation.  If he continues to be anemic I will refer him for hematologic evaluation.  He tells me he has just stopped taking Zetia.  I will recheck a complete set of laboratory including lipid studies and chemistry profile as well as TSH.  [facility] recommended he increase his atorvastatin 40 mg.  He was also given a flu shot today in the office.  I will contact him regarding the blood work.  I will see him in 6 months for reevaluation or sooner if problem arise.     Troy Sine, MD, [facility]  06/19/2015 6:59 PM

## 2015-06-20 ENCOUNTER — Telehealth: Payer: Self-pay | Admitting: *Deleted

## 2015-06-20 NOTE — Telephone Encounter (Signed)
Fax returned to Trish at [facility] w/ diagnosis code for rehab order.

## 2015-06-21 ENCOUNTER — Encounter (HOSPITAL_COMMUNITY): Payer: Self-pay

## 2015-06-21 LAB — IRON AND TIBC
%SAT: 32 % (ref 15–60)
Iron: 107 ug/dL (ref 50–180)
TIBC: 337 ug/dL (ref 250–425)
UIBC: 230 ug/dL (ref 125–400)

## 2015-06-21 LAB — FERRITIN: FERRITIN: 35 ng/mL (ref 22–322)

## 2015-06-21 LAB — LIPID PANEL
CHOL/HDL RATIO: 4.6 ratio (ref <=5.0)
Cholesterol: 160 mg/dL (ref 125–200)
HDL: 35 mg/dL — ABNORMAL LOW (ref >=40)
LDL CALC: 94 mg/dL (ref <130)
Triglycerides: 157 mg/dL — ABNORMAL HIGH (ref <150)
VLDL: 31 mg/dL — AB (ref <30)

## 2015-06-21 LAB — VITAMIN B12: Vitamin B-12: 689 pg/mL (ref 211–911)

## 2015-06-21 LAB — TSH: TSH: 2.4 u[IU]/mL (ref 0.350–4.500)

## 2015-06-21 LAB — FOLATE: Folate: >20 ng/mL

## 2015-06-22 LAB — CBC

## 2015-06-23 ENCOUNTER — Encounter (HOSPITAL_COMMUNITY)
Admission: RE | Admit: 2015-06-23 | Discharge: 2015-06-23 | Disposition: A | Discharge status: Home / Self Care | Payer: Self-pay | Source: Ambulatory Visit | Attending: Cardiovascular Disease | Admitting: Cardiovascular Disease

## 2015-06-26 ENCOUNTER — Encounter (HOSPITAL_COMMUNITY): Payer: Self-pay

## 2015-06-26 DIAGNOSIS — E785 Hyperlipidemia, unspecified: Secondary | ICD-10-CM | POA: Insufficient documentation

## 2015-06-26 DIAGNOSIS — I10 Essential (primary) hypertension: Secondary | ICD-10-CM | POA: Insufficient documentation

## 2015-06-26 DIAGNOSIS — Z951 Presence of aortocoronary bypass graft: Secondary | ICD-10-CM | POA: Insufficient documentation

## 2015-06-26 DIAGNOSIS — I4892 Unspecified atrial flutter: Secondary | ICD-10-CM | POA: Insufficient documentation

## 2015-06-26 DIAGNOSIS — R0609 Other forms of dyspnea: Secondary | ICD-10-CM | POA: Insufficient documentation

## 2015-06-26 DIAGNOSIS — Z9889 Other specified postprocedural states: Secondary | ICD-10-CM | POA: Insufficient documentation

## 2015-06-26 DIAGNOSIS — I2510 Atherosclerotic heart disease of native coronary artery without angina pectoris: Secondary | ICD-10-CM | POA: Insufficient documentation

## 2015-06-26 DIAGNOSIS — I714 Abdominal aortic aneurysm, without rupture: Secondary | ICD-10-CM | POA: Insufficient documentation

## 2015-06-26 DIAGNOSIS — Z87891 Personal history of nicotine dependence: Secondary | ICD-10-CM | POA: Insufficient documentation

## 2015-06-28 ENCOUNTER — Encounter (HOSPITAL_COMMUNITY): Payer: Self-pay

## 2015-06-30 ENCOUNTER — Encounter (HOSPITAL_COMMUNITY): Payer: Self-pay

## 2015-07-03 ENCOUNTER — Encounter (HOSPITAL_COMMUNITY)
Admission: RE | Admit: 2015-07-03 | Discharge: 2015-07-03 | Disposition: A | Discharge status: Home / Self Care | Payer: Self-pay | Source: Ambulatory Visit | Attending: Cardiovascular Disease | Admitting: Cardiovascular Disease

## 2015-07-05 ENCOUNTER — Encounter (HOSPITAL_COMMUNITY)
Admission: RE | Admit: 2015-07-05 | Discharge: 2015-07-05 | Disposition: A | Discharge status: Home / Self Care | Payer: Self-pay | Source: Ambulatory Visit | Attending: Cardiovascular Disease | Admitting: Cardiovascular Disease

## 2015-07-07 ENCOUNTER — Telehealth: Payer: Self-pay | Admitting: Cardiovascular Disease

## 2015-07-07 ENCOUNTER — Encounter (HOSPITAL_COMMUNITY): Admission: RE | Admit: 2015-07-07 | Payer: Self-pay | Source: Ambulatory Visit

## 2015-07-07 NOTE — Telephone Encounter (Signed)
Doubt cardiac Derek Wells

## 2015-07-07 NOTE — Telephone Encounter (Signed)
Spoke with patient and communicated lab results. He had checked the labs in MyChart but didn't quite understand them.   He reports last night around 830pm after eating a good meal and dessert and [place] [employer] [facility] [employer] the glands under his jaw began hurting and the he developed a splitting headache. He did not have chest pain, he did not fee his heart racing. There was nothing he had done out of his normal routine. He was concerned this may be cardiac related. Informed him I would run his symptoms by the MD to get their opinion but that it did not sound cardiac to me. He thanked Charity fundraiserN for call back and will await follow up call with MD advice  Routing to DOD

## 2015-07-07 NOTE — Telephone Encounter (Signed)
Mr. Derek Wells is wanting to know his lab results from last week. Also his glands under his jaw starting aching and he has a splitting headache for two hours . Please call

## 2015-07-07 NOTE — Telephone Encounter (Signed)
Patient notified. Voiced understanding.

## 2015-07-10 ENCOUNTER — Encounter (HOSPITAL_COMMUNITY)
Admission: RE | Admit: 2015-07-10 | Discharge: 2015-07-10 | Disposition: A | Discharge status: Home / Self Care | Payer: Self-pay | Source: Ambulatory Visit | Attending: Cardiovascular Disease | Admitting: Cardiovascular Disease

## 2015-07-12 ENCOUNTER — Encounter (HOSPITAL_COMMUNITY)
Admission: RE | Admit: 2015-07-12 | Discharge: 2015-07-12 | Disposition: A | Discharge status: Home / Self Care | Payer: Self-pay | Source: Ambulatory Visit | Attending: Cardiovascular Disease | Admitting: Cardiovascular Disease

## 2015-07-14 ENCOUNTER — Encounter (HOSPITAL_COMMUNITY): Payer: Self-pay

## 2015-07-17 ENCOUNTER — Encounter (HOSPITAL_COMMUNITY): Payer: Self-pay

## 2015-07-19 ENCOUNTER — Encounter (HOSPITAL_COMMUNITY)
Admission: RE | Admit: 2015-07-19 | Discharge: 2015-07-19 | Disposition: A | Discharge status: Home / Self Care | Payer: Self-pay | Source: Ambulatory Visit | Attending: Cardiovascular Disease | Admitting: Cardiovascular Disease

## 2015-07-21 ENCOUNTER — Encounter (HOSPITAL_COMMUNITY)
Admission: RE | Admit: 2015-07-21 | Discharge: 2015-07-21 | Disposition: A | Discharge status: Home / Self Care | Payer: Self-pay | Source: Ambulatory Visit | Attending: Cardiovascular Disease | Admitting: Cardiovascular Disease

## 2015-07-24 ENCOUNTER — Encounter (HOSPITAL_COMMUNITY)
Admission: RE | Admit: 2015-07-24 | Discharge: 2015-07-24 | Disposition: A | Discharge status: Home / Self Care | Payer: Self-pay | Source: Ambulatory Visit | Attending: Cardiovascular Disease | Admitting: Cardiovascular Disease

## 2015-07-26 ENCOUNTER — Encounter (HOSPITAL_COMMUNITY)
Admission: RE | Admit: 2015-07-26 | Discharge: 2015-07-26 | Disposition: A | Discharge status: Home / Self Care | Payer: Self-pay | Source: Ambulatory Visit | Attending: Cardiovascular Disease | Admitting: Cardiovascular Disease

## 2015-07-26 DIAGNOSIS — Z48812 Encounter for surgical aftercare following surgery on the circulatory system: Secondary | ICD-10-CM | POA: Insufficient documentation

## 2015-07-26 DIAGNOSIS — Z951 Presence of aortocoronary bypass graft: Secondary | ICD-10-CM | POA: Insufficient documentation

## 2015-07-28 ENCOUNTER — Encounter (HOSPITAL_COMMUNITY): Payer: Self-pay

## 2015-07-31 ENCOUNTER — Encounter (HOSPITAL_COMMUNITY)
Admission: RE | Admit: 2015-07-31 | Discharge: 2015-07-31 | Disposition: A | Discharge status: Home / Self Care | Payer: Self-pay | Source: Ambulatory Visit | Attending: Cardiovascular Disease | Admitting: Cardiovascular Disease

## 2015-08-02 ENCOUNTER — Encounter (HOSPITAL_COMMUNITY): Payer: Self-pay

## 2015-08-04 ENCOUNTER — Encounter (HOSPITAL_COMMUNITY): Payer: Self-pay

## 2015-08-07 ENCOUNTER — Encounter (HOSPITAL_COMMUNITY): Payer: Self-pay

## 2015-08-09 ENCOUNTER — Encounter (HOSPITAL_COMMUNITY)
Admission: RE | Admit: 2015-08-09 | Discharge: 2015-08-09 | Disposition: A | Discharge status: Home / Self Care | Payer: Self-pay | Source: Ambulatory Visit | Attending: Cardiovascular Disease | Admitting: Cardiovascular Disease

## 2015-08-11 ENCOUNTER — Encounter (HOSPITAL_COMMUNITY)
Admission: RE | Admit: 2015-08-11 | Discharge: 2015-08-11 | Disposition: A | Discharge status: Home / Self Care | Payer: Self-pay | Source: Ambulatory Visit | Attending: Cardiovascular Disease | Admitting: Cardiovascular Disease

## 2015-08-14 ENCOUNTER — Encounter (HOSPITAL_COMMUNITY): Payer: Self-pay

## 2015-08-16 ENCOUNTER — Encounter (HOSPITAL_COMMUNITY): Payer: Self-pay

## 2015-08-21 ENCOUNTER — Encounter (HOSPITAL_COMMUNITY): Payer: Self-pay

## 2015-08-23 ENCOUNTER — Encounter (HOSPITAL_COMMUNITY): Payer: Self-pay

## 2015-08-25 ENCOUNTER — Encounter (HOSPITAL_COMMUNITY): Payer: Medicare Other

## 2015-08-25 DIAGNOSIS — Z48812 Encounter for surgical aftercare following surgery on the circulatory system: Secondary | ICD-10-CM | POA: Insufficient documentation

## 2015-08-25 DIAGNOSIS — Z951 Presence of aortocoronary bypass graft: Secondary | ICD-10-CM | POA: Insufficient documentation

## 2015-08-28 ENCOUNTER — Encounter (HOSPITAL_COMMUNITY)
Admission: RE | Admit: 2015-08-28 | Discharge: 2015-08-28 | Disposition: A | Discharge status: Home / Self Care | Payer: Self-pay | Source: Ambulatory Visit | Attending: Cardiovascular Disease | Admitting: Cardiovascular Disease

## 2015-08-30 ENCOUNTER — Encounter (HOSPITAL_COMMUNITY)
Admission: RE | Admit: 2015-08-30 | Discharge: 2015-08-30 | Disposition: A | Discharge status: Home / Self Care | Payer: Self-pay | Source: Ambulatory Visit | Attending: Cardiovascular Disease | Admitting: Cardiovascular Disease

## 2015-09-01 ENCOUNTER — Encounter (HOSPITAL_COMMUNITY)
Admission: RE | Admit: 2015-09-01 | Discharge: 2015-09-01 | Disposition: A | Discharge status: Home / Self Care | Payer: Self-pay | Source: Ambulatory Visit | Attending: Cardiovascular Disease | Admitting: Cardiovascular Disease

## 2015-09-04 ENCOUNTER — Encounter (HOSPITAL_COMMUNITY): Payer: Self-pay

## 2015-09-06 ENCOUNTER — Encounter (HOSPITAL_COMMUNITY): Payer: Self-pay

## 2015-09-08 ENCOUNTER — Encounter (HOSPITAL_COMMUNITY): Payer: Self-pay

## 2015-09-11 ENCOUNTER — Encounter (HOSPITAL_COMMUNITY): Payer: Self-pay

## 2015-09-13 ENCOUNTER — Encounter (HOSPITAL_COMMUNITY): Payer: Self-pay

## 2015-09-15 ENCOUNTER — Encounter (HOSPITAL_COMMUNITY): Payer: Self-pay

## 2015-09-20 ENCOUNTER — Encounter (HOSPITAL_COMMUNITY): Payer: Self-pay

## 2015-09-22 ENCOUNTER — Encounter (HOSPITAL_COMMUNITY): Payer: Self-pay

## 2015-09-27 ENCOUNTER — Encounter (HOSPITAL_COMMUNITY)
Admission: RE | Admit: 2015-09-27 | Discharge: 2015-09-27 | Disposition: A | Discharge status: Home / Self Care | Payer: Self-pay | Source: Ambulatory Visit | Attending: Cardiovascular Disease | Admitting: Cardiovascular Disease

## 2015-09-27 DIAGNOSIS — Z951 Presence of aortocoronary bypass graft: Secondary | ICD-10-CM | POA: Insufficient documentation

## 2015-09-27 DIAGNOSIS — Z48812 Encounter for surgical aftercare following surgery on the circulatory system: Secondary | ICD-10-CM | POA: Insufficient documentation

## 2015-09-29 ENCOUNTER — Encounter (HOSPITAL_COMMUNITY): Payer: Self-pay

## 2015-10-02 ENCOUNTER — Encounter (HOSPITAL_COMMUNITY): Payer: Self-pay

## 2015-10-02 ENCOUNTER — Other Ambulatory Visit: Payer: Self-pay | Admitting: Cardiovascular Disease

## 2015-10-03 ENCOUNTER — Other Ambulatory Visit: Payer: Self-pay

## 2015-10-03 MED ORDER — VALSARTAN 160 MG PO TABS
160.0000 mg | ORAL_TABLET | Freq: Every day | ORAL | Status: DC
Start: 2015-10-03 — End: 2016-06-24

## 2015-10-04 ENCOUNTER — Encounter (HOSPITAL_COMMUNITY): Payer: Self-pay

## 2015-10-06 ENCOUNTER — Encounter (HOSPITAL_COMMUNITY): Payer: Self-pay

## 2015-10-09 ENCOUNTER — Encounter (HOSPITAL_COMMUNITY): Payer: Self-pay

## 2015-10-11 ENCOUNTER — Encounter (HOSPITAL_COMMUNITY)
Admission: RE | Admit: 2015-10-11 | Discharge: 2015-10-11 | Disposition: A | Discharge status: Home / Self Care | Payer: Self-pay | Source: Ambulatory Visit | Attending: Cardiovascular Disease | Admitting: Cardiovascular Disease

## 2015-10-13 ENCOUNTER — Encounter (HOSPITAL_COMMUNITY): Payer: Self-pay

## 2015-10-16 ENCOUNTER — Encounter (HOSPITAL_COMMUNITY)
Admission: RE | Admit: 2015-10-16 | Discharge: 2015-10-16 | Disposition: A | Discharge status: Home / Self Care | Payer: Self-pay | Source: Ambulatory Visit | Attending: Cardiovascular Disease | Admitting: Cardiovascular Disease

## 2015-10-18 ENCOUNTER — Encounter (HOSPITAL_COMMUNITY)
Admission: RE | Admit: 2015-10-18 | Discharge: 2015-10-18 | Disposition: A | Discharge status: Home / Self Care | Payer: Self-pay | Source: Ambulatory Visit | Attending: Cardiovascular Disease | Admitting: Cardiovascular Disease

## 2015-10-20 ENCOUNTER — Other Ambulatory Visit: Payer: Self-pay | Admitting: Cardiovascular Disease

## 2015-10-20 ENCOUNTER — Encounter (HOSPITAL_COMMUNITY)
Admission: RE | Admit: 2015-10-20 | Discharge: 2015-10-20 | Disposition: A | Discharge status: Home / Self Care | Payer: Self-pay | Source: Ambulatory Visit | Attending: Cardiovascular Disease | Admitting: Cardiovascular Disease

## 2015-10-20 NOTE — Telephone Encounter (Signed)
REFILL 

## 2015-10-23 ENCOUNTER — Encounter (HOSPITAL_COMMUNITY)
Admission: RE | Admit: 2015-10-23 | Discharge: 2015-10-23 | Disposition: A | Discharge status: Home / Self Care | Payer: Self-pay | Source: Ambulatory Visit | Attending: Cardiovascular Disease | Admitting: Cardiovascular Disease

## 2015-10-25 ENCOUNTER — Encounter (HOSPITAL_COMMUNITY): Payer: Medicare Other

## 2015-10-25 DIAGNOSIS — Z48812 Encounter for surgical aftercare following surgery on the circulatory system: Secondary | ICD-10-CM | POA: Insufficient documentation

## 2015-10-25 DIAGNOSIS — Z951 Presence of aortocoronary bypass graft: Secondary | ICD-10-CM | POA: Insufficient documentation

## 2015-10-27 ENCOUNTER — Encounter (HOSPITAL_COMMUNITY)
Admission: RE | Admit: 2015-10-27 | Discharge: 2015-10-27 | Disposition: A | Discharge status: Home / Self Care | Payer: Self-pay | Source: Ambulatory Visit | Attending: Cardiovascular Disease | Admitting: Cardiovascular Disease

## 2015-10-30 ENCOUNTER — Encounter (HOSPITAL_COMMUNITY)
Admission: RE | Admit: 2015-10-30 | Discharge: 2015-10-30 | Disposition: A | Discharge status: Home / Self Care | Payer: Self-pay | Source: Ambulatory Visit | Attending: Cardiovascular Disease | Admitting: Cardiovascular Disease

## 2015-11-01 ENCOUNTER — Encounter (HOSPITAL_COMMUNITY): Payer: Self-pay

## 2015-11-03 ENCOUNTER — Encounter (HOSPITAL_COMMUNITY)
Admission: RE | Admit: 2015-11-03 | Discharge: 2015-11-03 | Disposition: A | Discharge status: Home / Self Care | Payer: Self-pay | Source: Ambulatory Visit | Attending: Cardiovascular Disease | Admitting: Cardiovascular Disease

## 2015-11-06 ENCOUNTER — Encounter (HOSPITAL_COMMUNITY): Payer: Self-pay

## 2015-11-08 ENCOUNTER — Encounter (HOSPITAL_COMMUNITY): Payer: Self-pay

## 2015-11-10 ENCOUNTER — Encounter (HOSPITAL_COMMUNITY): Payer: Self-pay

## 2015-11-13 ENCOUNTER — Encounter (HOSPITAL_COMMUNITY)
Admission: RE | Admit: 2015-11-13 | Discharge: 2015-11-13 | Disposition: A | Discharge status: Home / Self Care | Payer: Self-pay | Source: Ambulatory Visit | Attending: Cardiovascular Disease | Admitting: Cardiovascular Disease

## 2015-11-15 ENCOUNTER — Encounter (HOSPITAL_COMMUNITY): Payer: Self-pay

## 2015-11-17 ENCOUNTER — Encounter (HOSPITAL_COMMUNITY)
Admission: RE | Admit: 2015-11-17 | Discharge: 2015-11-17 | Disposition: A | Discharge status: Home / Self Care | Payer: Self-pay | Source: Ambulatory Visit | Attending: Cardiovascular Disease | Admitting: Cardiovascular Disease

## 2015-11-20 ENCOUNTER — Encounter (HOSPITAL_COMMUNITY)
Admission: RE | Admit: 2015-11-20 | Discharge: 2015-11-20 | Disposition: A | Discharge status: Home / Self Care | Payer: Self-pay | Source: Ambulatory Visit | Attending: Cardiovascular Disease | Admitting: Cardiovascular Disease

## 2015-11-22 ENCOUNTER — Encounter (HOSPITAL_COMMUNITY): Payer: Self-pay

## 2015-11-22 DIAGNOSIS — Z48812 Encounter for surgical aftercare following surgery on the circulatory system: Secondary | ICD-10-CM | POA: Insufficient documentation

## 2015-11-22 DIAGNOSIS — L821 Other seborrheic keratosis: Secondary | ICD-10-CM | POA: Diagnosis not present

## 2015-11-22 DIAGNOSIS — Z85820 Personal history of malignant melanoma of skin: Secondary | ICD-10-CM | POA: Diagnosis not present

## 2015-11-22 DIAGNOSIS — Z951 Presence of aortocoronary bypass graft: Secondary | ICD-10-CM | POA: Insufficient documentation

## 2015-11-22 DIAGNOSIS — D1801 Hemangioma of skin and subcutaneous tissue: Secondary | ICD-10-CM | POA: Diagnosis not present

## 2015-11-24 ENCOUNTER — Encounter (HOSPITAL_COMMUNITY)
Admission: RE | Admit: 2015-11-24 | Discharge: 2015-11-24 | Disposition: A | Discharge status: Home / Self Care | Payer: Self-pay | Source: Ambulatory Visit | Attending: Cardiovascular Disease | Admitting: Cardiovascular Disease

## 2015-11-27 ENCOUNTER — Encounter (HOSPITAL_COMMUNITY)
Admission: RE | Admit: 2015-11-27 | Discharge: 2015-11-27 | Disposition: A | Discharge status: Home / Self Care | Payer: Self-pay | Source: Ambulatory Visit | Attending: Cardiovascular Disease | Admitting: Cardiovascular Disease

## 2015-11-29 ENCOUNTER — Encounter (HOSPITAL_COMMUNITY): Payer: Self-pay

## 2015-12-01 ENCOUNTER — Encounter (HOSPITAL_COMMUNITY)
Admission: RE | Admit: 2015-12-01 | Discharge: 2015-12-01 | Disposition: A | Discharge status: Home / Self Care | Payer: Self-pay | Source: Ambulatory Visit | Attending: Cardiovascular Disease | Admitting: Cardiovascular Disease

## 2015-12-04 ENCOUNTER — Encounter (HOSPITAL_COMMUNITY): Payer: Self-pay

## 2015-12-06 ENCOUNTER — Encounter (HOSPITAL_COMMUNITY): Payer: Self-pay

## 2015-12-08 ENCOUNTER — Encounter (HOSPITAL_COMMUNITY): Admission: RE | Admit: 2015-12-08 | Payer: Self-pay | Source: Ambulatory Visit

## 2015-12-11 ENCOUNTER — Encounter (HOSPITAL_COMMUNITY): Payer: Self-pay

## 2015-12-13 ENCOUNTER — Encounter (HOSPITAL_COMMUNITY): Payer: Self-pay

## 2015-12-15 ENCOUNTER — Encounter (HOSPITAL_COMMUNITY): Payer: Self-pay

## 2015-12-15 DIAGNOSIS — T6291XA Toxic effect of unspecified noxious substance eaten as food, accidental (unintentional), initial encounter: Secondary | ICD-10-CM | POA: Diagnosis not present

## 2015-12-18 ENCOUNTER — Encounter (HOSPITAL_COMMUNITY): Payer: Self-pay

## 2015-12-20 ENCOUNTER — Encounter (HOSPITAL_COMMUNITY): Payer: Self-pay

## 2015-12-22 ENCOUNTER — Encounter (HOSPITAL_COMMUNITY): Admission: RE | Admit: 2015-12-22 | Payer: Self-pay | Source: Ambulatory Visit

## 2015-12-22 ENCOUNTER — Telehealth (HOSPITAL_COMMUNITY): Payer: Self-pay | Admitting: *Deleted

## 2015-12-25 ENCOUNTER — Encounter (HOSPITAL_COMMUNITY)
Admission: RE | Admit: 2015-12-25 | Discharge: 2015-12-25 | Disposition: A | Discharge status: Home / Self Care | Payer: Self-pay | Source: Ambulatory Visit | Attending: Cardiovascular Disease | Admitting: Cardiovascular Disease

## 2015-12-25 DIAGNOSIS — Z48812 Encounter for surgical aftercare following surgery on the circulatory system: Secondary | ICD-10-CM | POA: Insufficient documentation

## 2015-12-25 DIAGNOSIS — Z951 Presence of aortocoronary bypass graft: Secondary | ICD-10-CM | POA: Insufficient documentation

## 2015-12-27 ENCOUNTER — Encounter (HOSPITAL_COMMUNITY): Admission: RE | Admit: 2015-12-27 | Payer: Self-pay | Source: Ambulatory Visit

## 2015-12-27 ENCOUNTER — Telehealth (HOSPITAL_COMMUNITY): Payer: Self-pay | Admitting: *Deleted

## 2015-12-29 ENCOUNTER — Encounter (HOSPITAL_COMMUNITY): Payer: Self-pay

## 2016-01-01 ENCOUNTER — Encounter (HOSPITAL_COMMUNITY): Payer: Self-pay

## 2016-01-03 ENCOUNTER — Encounter (HOSPITAL_COMMUNITY): Payer: Self-pay

## 2016-01-05 ENCOUNTER — Encounter (HOSPITAL_COMMUNITY): Payer: Self-pay

## 2016-01-08 ENCOUNTER — Encounter (HOSPITAL_COMMUNITY)
Admission: RE | Admit: 2016-01-08 | Discharge: 2016-01-08 | Disposition: A | Discharge status: Home / Self Care | Payer: Self-pay | Source: Ambulatory Visit | Attending: Cardiovascular Disease | Admitting: Cardiovascular Disease

## 2016-01-10 ENCOUNTER — Encounter (HOSPITAL_COMMUNITY)
Admission: RE | Admit: 2016-01-10 | Discharge: 2016-01-10 | Disposition: A | Discharge status: Home / Self Care | Payer: Self-pay | Source: Ambulatory Visit | Attending: Cardiovascular Disease | Admitting: Cardiovascular Disease

## 2016-01-12 ENCOUNTER — Encounter (HOSPITAL_COMMUNITY)
Admission: RE | Admit: 2016-01-12 | Discharge: 2016-01-12 | Disposition: A | Discharge status: Home / Self Care | Payer: Self-pay | Source: Ambulatory Visit | Attending: Cardiovascular Disease | Admitting: Cardiovascular Disease

## 2016-01-15 ENCOUNTER — Encounter (HOSPITAL_COMMUNITY)
Admission: RE | Admit: 2016-01-15 | Discharge: 2016-01-15 | Disposition: A | Discharge status: Home / Self Care | Payer: Self-pay | Source: Ambulatory Visit | Attending: Cardiovascular Disease | Admitting: Cardiovascular Disease

## 2016-01-17 ENCOUNTER — Encounter (HOSPITAL_COMMUNITY)
Admission: RE | Admit: 2016-01-17 | Discharge: 2016-01-17 | Disposition: A | Discharge status: Home / Self Care | Payer: Self-pay | Source: Ambulatory Visit | Attending: Cardiovascular Disease | Admitting: Cardiovascular Disease

## 2016-01-19 ENCOUNTER — Encounter (HOSPITAL_COMMUNITY): Payer: Self-pay

## 2016-01-22 ENCOUNTER — Encounter (HOSPITAL_COMMUNITY)
Admission: RE | Admit: 2016-01-22 | Discharge: 2016-01-22 | Disposition: A | Discharge status: Home / Self Care | Payer: Self-pay | Source: Ambulatory Visit | Attending: Cardiovascular Disease | Admitting: Cardiovascular Disease

## 2016-01-22 DIAGNOSIS — Z951 Presence of aortocoronary bypass graft: Secondary | ICD-10-CM | POA: Insufficient documentation

## 2016-01-24 ENCOUNTER — Encounter (HOSPITAL_COMMUNITY)
Admission: RE | Admit: 2016-01-24 | Discharge: 2016-01-24 | Disposition: A | Discharge status: Home / Self Care | Payer: Self-pay | Source: Ambulatory Visit | Attending: Cardiovascular Disease | Admitting: Cardiovascular Disease

## 2016-01-26 ENCOUNTER — Encounter (HOSPITAL_COMMUNITY)
Admission: RE | Admit: 2016-01-26 | Discharge: 2016-01-26 | Disposition: A | Discharge status: Home / Self Care | Payer: Self-pay | Source: Ambulatory Visit | Attending: Cardiovascular Disease | Admitting: Cardiovascular Disease

## 2016-01-29 ENCOUNTER — Encounter (HOSPITAL_COMMUNITY)
Admission: RE | Admit: 2016-01-29 | Discharge: 2016-01-29 | Disposition: A | Discharge status: Home / Self Care | Payer: Self-pay | Source: Ambulatory Visit | Attending: Cardiovascular Disease | Admitting: Cardiovascular Disease

## 2016-01-31 ENCOUNTER — Encounter (HOSPITAL_COMMUNITY): Payer: Self-pay

## 2016-02-02 ENCOUNTER — Encounter (HOSPITAL_COMMUNITY): Payer: Self-pay

## 2016-02-05 ENCOUNTER — Encounter (HOSPITAL_COMMUNITY): Payer: Self-pay

## 2016-02-07 ENCOUNTER — Encounter (HOSPITAL_COMMUNITY)
Admission: RE | Admit: 2016-02-07 | Discharge: 2016-02-07 | Disposition: A | Discharge status: Home / Self Care | Payer: Self-pay | Source: Ambulatory Visit | Attending: Cardiovascular Disease | Admitting: Cardiovascular Disease

## 2016-02-09 ENCOUNTER — Encounter (HOSPITAL_COMMUNITY): Payer: Self-pay

## 2016-02-12 ENCOUNTER — Encounter (HOSPITAL_COMMUNITY)
Admission: RE | Admit: 2016-02-12 | Discharge: 2016-02-12 | Disposition: A | Discharge status: Home / Self Care | Payer: Self-pay | Source: Ambulatory Visit | Attending: Cardiovascular Disease | Admitting: Cardiovascular Disease

## 2016-02-14 ENCOUNTER — Encounter (HOSPITAL_COMMUNITY): Payer: Self-pay

## 2016-02-16 ENCOUNTER — Encounter (HOSPITAL_COMMUNITY)
Admission: RE | Admit: 2016-02-16 | Discharge: 2016-02-16 | Disposition: A | Discharge status: Home / Self Care | Payer: Self-pay | Source: Ambulatory Visit | Attending: Cardiovascular Disease | Admitting: Cardiovascular Disease

## 2016-02-21 ENCOUNTER — Encounter (HOSPITAL_COMMUNITY): Payer: Self-pay

## 2016-02-23 ENCOUNTER — Encounter (HOSPITAL_COMMUNITY): Payer: Self-pay

## 2016-02-23 DIAGNOSIS — Z951 Presence of aortocoronary bypass graft: Secondary | ICD-10-CM | POA: Insufficient documentation

## 2016-02-26 ENCOUNTER — Encounter (HOSPITAL_COMMUNITY): Payer: Self-pay

## 2016-02-28 ENCOUNTER — Encounter (HOSPITAL_COMMUNITY): Payer: Self-pay

## 2016-03-01 ENCOUNTER — Encounter (HOSPITAL_COMMUNITY): Payer: Self-pay

## 2016-03-04 ENCOUNTER — Encounter (HOSPITAL_COMMUNITY): Payer: Self-pay

## 2016-03-06 ENCOUNTER — Encounter (HOSPITAL_COMMUNITY)
Admission: RE | Admit: 2016-03-06 | Discharge: 2016-03-06 | Disposition: A | Discharge status: Home / Self Care | Payer: Self-pay | Source: Ambulatory Visit | Attending: Cardiovascular Disease | Admitting: Cardiovascular Disease

## 2016-03-08 ENCOUNTER — Encounter (HOSPITAL_COMMUNITY)
Admission: RE | Admit: 2016-03-08 | Discharge: 2016-03-08 | Disposition: A | Discharge status: Home / Self Care | Payer: Self-pay | Source: Ambulatory Visit | Attending: Cardiovascular Disease | Admitting: Cardiovascular Disease

## 2016-03-08 DIAGNOSIS — D509 Iron deficiency anemia, unspecified: Secondary | ICD-10-CM | POA: Diagnosis not present

## 2016-03-11 ENCOUNTER — Encounter (HOSPITAL_COMMUNITY)
Admission: RE | Admit: 2016-03-11 | Discharge: 2016-03-11 | Disposition: A | Discharge status: Home / Self Care | Payer: Self-pay | Source: Ambulatory Visit | Attending: Cardiovascular Disease | Admitting: Cardiovascular Disease

## 2016-03-11 DIAGNOSIS — Z951 Presence of aortocoronary bypass graft: Secondary | ICD-10-CM

## 2016-03-12 ENCOUNTER — Other Ambulatory Visit: Payer: Self-pay

## 2016-03-12 MED ORDER — ATORVASTATIN CALCIUM 20 MG PO TABS: 20.0000 mg | ORAL_TABLET | Freq: Every day | ORAL | Status: DC

## 2016-03-13 ENCOUNTER — Encounter (HOSPITAL_COMMUNITY)
Admission: RE | Admit: 2016-03-13 | Discharge: 2016-03-13 | Disposition: A | Discharge status: Home / Self Care | Payer: Self-pay | Source: Ambulatory Visit | Attending: Cardiovascular Disease | Admitting: Cardiovascular Disease

## 2016-03-15 ENCOUNTER — Encounter (HOSPITAL_COMMUNITY)
Admission: RE | Admit: 2016-03-15 | Discharge: 2016-03-15 | Disposition: A | Discharge status: Home / Self Care | Payer: Self-pay | Source: Ambulatory Visit | Attending: Cardiovascular Disease | Admitting: Cardiovascular Disease

## 2016-03-18 ENCOUNTER — Encounter (HOSPITAL_COMMUNITY): Payer: Self-pay

## 2016-03-20 ENCOUNTER — Encounter (HOSPITAL_COMMUNITY): Payer: Self-pay

## 2016-03-22 ENCOUNTER — Encounter (HOSPITAL_COMMUNITY)
Admission: RE | Admit: 2016-03-22 | Discharge: 2016-03-22 | Disposition: A | Discharge status: Home / Self Care | Payer: Self-pay | Source: Ambulatory Visit | Attending: Cardiovascular Disease | Admitting: Cardiovascular Disease

## 2016-03-25 ENCOUNTER — Encounter (HOSPITAL_COMMUNITY)
Admission: RE | Admit: 2016-03-25 | Discharge: 2016-03-25 | Disposition: A | Discharge status: Home / Self Care | Payer: Self-pay | Source: Ambulatory Visit | Attending: Cardiovascular Disease | Admitting: Cardiovascular Disease

## 2016-03-25 DIAGNOSIS — Z951 Presence of aortocoronary bypass graft: Secondary | ICD-10-CM | POA: Insufficient documentation

## 2016-03-27 ENCOUNTER — Encounter (HOSPITAL_COMMUNITY): Payer: Self-pay

## 2016-03-27 DIAGNOSIS — D509 Iron deficiency anemia, unspecified: Secondary | ICD-10-CM | POA: Diagnosis not present

## 2016-03-29 ENCOUNTER — Encounter (HOSPITAL_COMMUNITY)
Admission: RE | Admit: 2016-03-29 | Discharge: 2016-03-29 | Disposition: A | Discharge status: Home / Self Care | Payer: Self-pay | Source: Ambulatory Visit | Attending: Cardiovascular Disease | Admitting: Cardiovascular Disease

## 2016-04-01 ENCOUNTER — Encounter (HOSPITAL_COMMUNITY)
Admission: RE | Admit: 2016-04-01 | Discharge: 2016-04-01 | Disposition: A | Discharge status: Home / Self Care | Payer: Self-pay | Source: Ambulatory Visit | Attending: Cardiovascular Disease | Admitting: Cardiovascular Disease

## 2016-04-03 ENCOUNTER — Encounter (HOSPITAL_COMMUNITY)
Admission: RE | Admit: 2016-04-03 | Discharge: 2016-04-03 | Disposition: A | Discharge status: Home / Self Care | Payer: Self-pay | Source: Ambulatory Visit | Attending: Cardiovascular Disease | Admitting: Cardiovascular Disease

## 2016-04-05 ENCOUNTER — Encounter (HOSPITAL_COMMUNITY)
Admission: RE | Admit: 2016-04-05 | Discharge: 2016-04-05 | Disposition: A | Discharge status: Home / Self Care | Payer: Self-pay | Source: Ambulatory Visit | Attending: Cardiovascular Disease | Admitting: Cardiovascular Disease

## 2016-04-08 ENCOUNTER — Encounter (HOSPITAL_COMMUNITY)
Admission: RE | Admit: 2016-04-08 | Discharge: 2016-04-08 | Disposition: A | Discharge status: Home / Self Care | Payer: Self-pay | Source: Ambulatory Visit | Attending: Cardiovascular Disease | Admitting: Cardiovascular Disease

## 2016-04-10 ENCOUNTER — Encounter (HOSPITAL_COMMUNITY): Payer: Self-pay

## 2016-04-12 ENCOUNTER — Encounter (HOSPITAL_COMMUNITY)
Admission: RE | Admit: 2016-04-12 | Discharge: 2016-04-12 | Disposition: A | Discharge status: Home / Self Care | Payer: Self-pay | Source: Ambulatory Visit | Attending: Cardiovascular Disease | Admitting: Cardiovascular Disease

## 2016-04-15 ENCOUNTER — Encounter (HOSPITAL_COMMUNITY)
Admission: RE | Admit: 2016-04-15 | Discharge: 2016-04-15 | Disposition: A | Discharge status: Home / Self Care | Payer: Self-pay | Source: Ambulatory Visit | Attending: Cardiovascular Disease | Admitting: Cardiovascular Disease

## 2016-04-17 ENCOUNTER — Encounter (HOSPITAL_COMMUNITY)
Admission: RE | Admit: 2016-04-17 | Discharge: 2016-04-17 | Disposition: A | Discharge status: Home / Self Care | Payer: Self-pay | Source: Ambulatory Visit | Attending: Cardiovascular Disease | Admitting: Cardiovascular Disease

## 2016-04-19 ENCOUNTER — Encounter (HOSPITAL_COMMUNITY): Payer: Self-pay

## 2016-04-22 ENCOUNTER — Encounter (HOSPITAL_COMMUNITY): Payer: Self-pay

## 2016-04-24 ENCOUNTER — Encounter (HOSPITAL_COMMUNITY): Payer: Self-pay

## 2016-04-24 DIAGNOSIS — Z951 Presence of aortocoronary bypass graft: Secondary | ICD-10-CM | POA: Insufficient documentation

## 2016-04-26 ENCOUNTER — Encounter (HOSPITAL_COMMUNITY)
Admission: RE | Admit: 2016-04-26 | Discharge: 2016-04-26 | Disposition: A | Discharge status: Home / Self Care | Payer: Self-pay | Source: Ambulatory Visit | Attending: Cardiovascular Disease | Admitting: Cardiovascular Disease

## 2016-04-29 ENCOUNTER — Encounter (HOSPITAL_COMMUNITY)
Admission: RE | Admit: 2016-04-29 | Discharge: 2016-04-29 | Disposition: A | Discharge status: Home / Self Care | Payer: Self-pay | Source: Ambulatory Visit | Attending: Cardiovascular Disease | Admitting: Cardiovascular Disease

## 2016-05-01 ENCOUNTER — Encounter (HOSPITAL_COMMUNITY): Payer: Self-pay

## 2016-05-03 ENCOUNTER — Encounter (HOSPITAL_COMMUNITY)
Admission: RE | Admit: 2016-05-03 | Discharge: 2016-05-03 | Disposition: A | Discharge status: Home / Self Care | Payer: Self-pay | Source: Ambulatory Visit | Attending: Cardiovascular Disease | Admitting: Cardiovascular Disease

## 2016-05-06 ENCOUNTER — Encounter (HOSPITAL_COMMUNITY)
Admission: RE | Admit: 2016-05-06 | Discharge: 2016-05-06 | Disposition: A | Discharge status: Home / Self Care | Payer: Self-pay | Source: Ambulatory Visit | Attending: Cardiovascular Disease | Admitting: Cardiovascular Disease

## 2016-05-08 ENCOUNTER — Encounter (HOSPITAL_COMMUNITY): Payer: Self-pay

## 2016-05-10 ENCOUNTER — Encounter (HOSPITAL_COMMUNITY): Payer: Self-pay

## 2016-05-13 ENCOUNTER — Encounter (HOSPITAL_COMMUNITY)
Admission: RE | Admit: 2016-05-13 | Discharge: 2016-05-13 | Disposition: A | Discharge status: Home / Self Care | Payer: Self-pay | Source: Ambulatory Visit | Attending: Cardiovascular Disease | Admitting: Cardiovascular Disease

## 2016-05-15 ENCOUNTER — Encounter (HOSPITAL_COMMUNITY)
Admission: RE | Admit: 2016-05-15 | Discharge: 2016-05-15 | Disposition: A | Discharge status: Home / Self Care | Payer: Self-pay | Source: Ambulatory Visit | Attending: Cardiovascular Disease | Admitting: Cardiovascular Disease

## 2016-05-17 ENCOUNTER — Encounter (HOSPITAL_COMMUNITY): Payer: Self-pay

## 2016-05-17 ENCOUNTER — Other Ambulatory Visit: Payer: Self-pay | Admitting: Cardiovascular Disease

## 2016-05-17 NOTE — Telephone Encounter (Signed)
Rx(s) sent to pharmacy electronically.  

## 2016-05-20 ENCOUNTER — Encounter (HOSPITAL_COMMUNITY): Admission: RE | Admit: 2016-05-20 | Payer: Self-pay | Source: Ambulatory Visit

## 2016-05-21 DIAGNOSIS — J309 Allergic rhinitis, unspecified: Secondary | ICD-10-CM | POA: Diagnosis not present

## 2016-05-21 DIAGNOSIS — R05 Cough: Secondary | ICD-10-CM | POA: Diagnosis not present

## 2016-05-22 ENCOUNTER — Encounter (HOSPITAL_COMMUNITY): Payer: Self-pay

## 2016-05-24 ENCOUNTER — Encounter (HOSPITAL_COMMUNITY): Payer: Self-pay

## 2016-05-24 DIAGNOSIS — Z951 Presence of aortocoronary bypass graft: Secondary | ICD-10-CM | POA: Insufficient documentation

## 2016-05-29 ENCOUNTER — Encounter (HOSPITAL_COMMUNITY)
Admission: RE | Admit: 2016-05-29 | Discharge: 2016-05-29 | Disposition: A | Discharge status: Home / Self Care | Payer: Self-pay | Source: Ambulatory Visit | Attending: Cardiovascular Disease | Admitting: Cardiovascular Disease

## 2016-05-31 ENCOUNTER — Encounter (HOSPITAL_COMMUNITY): Payer: Self-pay

## 2016-06-03 ENCOUNTER — Encounter (HOSPITAL_COMMUNITY): Payer: Self-pay

## 2016-06-05 ENCOUNTER — Encounter (HOSPITAL_COMMUNITY): Payer: Self-pay

## 2016-06-07 ENCOUNTER — Encounter (HOSPITAL_COMMUNITY): Payer: Self-pay

## 2016-06-10 ENCOUNTER — Encounter (HOSPITAL_COMMUNITY): Payer: Self-pay

## 2016-06-10 ENCOUNTER — Other Ambulatory Visit: Payer: Self-pay | Admitting: Cardiovascular Disease

## 2016-06-11 NOTE — Telephone Encounter (Signed)
Rx has been sent to the pharmacy electronically. ° °

## 2016-06-12 ENCOUNTER — Encounter (HOSPITAL_COMMUNITY)
Admission: RE | Admit: 2016-06-12 | Discharge: 2016-06-12 | Disposition: A | Discharge status: Home / Self Care | Payer: Self-pay | Source: Ambulatory Visit | Attending: Cardiovascular Disease | Admitting: Cardiovascular Disease

## 2016-06-14 ENCOUNTER — Encounter (HOSPITAL_COMMUNITY)
Admission: RE | Admit: 2016-06-14 | Discharge: 2016-06-14 | Disposition: A | Discharge status: Home / Self Care | Payer: Self-pay | Source: Ambulatory Visit | Attending: Cardiovascular Disease | Admitting: Cardiovascular Disease

## 2016-06-15 ENCOUNTER — Other Ambulatory Visit: Payer: Self-pay | Admitting: Cardiovascular Disease

## 2016-06-17 ENCOUNTER — Encounter (HOSPITAL_COMMUNITY): Payer: Self-pay

## 2016-06-18 NOTE — Telephone Encounter (Signed)
Rx(s) sent to pharmacy electronically.  

## 2016-06-19 ENCOUNTER — Encounter (HOSPITAL_COMMUNITY)
Admission: RE | Admit: 2016-06-19 | Discharge: 2016-06-19 | Disposition: A | Discharge status: Home / Self Care | Payer: Self-pay | Source: Ambulatory Visit | Attending: Cardiovascular Disease | Admitting: Cardiovascular Disease

## 2016-06-21 ENCOUNTER — Encounter (HOSPITAL_COMMUNITY): Payer: Self-pay

## 2016-06-24 ENCOUNTER — Encounter (HOSPITAL_COMMUNITY): Payer: Self-pay

## 2016-06-24 ENCOUNTER — Other Ambulatory Visit: Payer: Self-pay | Admitting: Cardiovascular Disease

## 2016-06-24 DIAGNOSIS — Z951 Presence of aortocoronary bypass graft: Secondary | ICD-10-CM | POA: Insufficient documentation

## 2016-06-26 ENCOUNTER — Encounter (HOSPITAL_COMMUNITY): Payer: Self-pay

## 2016-06-28 ENCOUNTER — Encounter (HOSPITAL_COMMUNITY): Payer: Self-pay

## 2016-07-01 ENCOUNTER — Encounter (HOSPITAL_COMMUNITY): Payer: Self-pay

## 2016-07-03 ENCOUNTER — Encounter (HOSPITAL_COMMUNITY): Payer: Self-pay

## 2016-07-05 ENCOUNTER — Encounter (HOSPITAL_COMMUNITY): Payer: Self-pay

## 2016-07-08 ENCOUNTER — Encounter (HOSPITAL_COMMUNITY): Payer: Self-pay

## 2016-07-10 ENCOUNTER — Encounter (HOSPITAL_COMMUNITY): Payer: Self-pay

## 2016-07-11 ENCOUNTER — Ambulatory Visit (INDEPENDENT_AMBULATORY_CARE_PROVIDER_SITE_OTHER): Payer: Medicare Other | Admitting: Cardiovascular Disease

## 2016-07-11 ENCOUNTER — Encounter: Payer: Self-pay | Admitting: Cardiovascular Disease

## 2016-07-11 VITALS — BP 142/68 | HR 66 | Ht 73.0 in | Wt 249.8 lb

## 2016-07-11 DIAGNOSIS — I2510 Atherosclerotic heart disease of native coronary artery without angina pectoris: Secondary | ICD-10-CM

## 2016-07-11 DIAGNOSIS — Z9889 Other specified postprocedural states: Secondary | ICD-10-CM

## 2016-07-11 DIAGNOSIS — I4892 Unspecified atrial flutter: Secondary | ICD-10-CM

## 2016-07-11 DIAGNOSIS — I10 Essential (primary) hypertension: Secondary | ICD-10-CM | POA: Diagnosis not present

## 2016-07-11 DIAGNOSIS — K432 Incisional hernia without obstruction or gangrene: Secondary | ICD-10-CM

## 2016-07-11 DIAGNOSIS — Z8679 Personal history of other diseases of the circulatory system: Secondary | ICD-10-CM

## 2016-07-11 DIAGNOSIS — Z951 Presence of aortocoronary bypass graft: Secondary | ICD-10-CM

## 2016-07-11 DIAGNOSIS — Z79899 Other long term (current) drug therapy: Secondary | ICD-10-CM

## 2016-07-11 DIAGNOSIS — E785 Hyperlipidemia, unspecified: Secondary | ICD-10-CM

## 2016-07-11 DIAGNOSIS — D509 Iron deficiency anemia, unspecified: Secondary | ICD-10-CM

## 2016-07-11 MED ORDER — AMLODIPINE BESYLATE 5 MG PO TABS: 5.0000 mg | ORAL_TABLET | Freq: Every day | ORAL | 11 refills | Status: DC

## 2016-07-11 MED ORDER — METOPROLOL SUCCINATE ER 25 MG PO TB24: 25.0000 mg | ORAL_TABLET | Freq: Every day | ORAL | 0 refills | Status: DC

## 2016-07-11 NOTE — Patient Instructions (Addendum)
Your physician recommends that you return for lab work fasting.  Your physician has requested that you have a lexiscan myoview. For further information please visit [email or web address]. Please follow instruction sheet, as given.  Your physician has recommended you make the following change in your medication:   1.) the metoprolol has been decreased to 25 mg daily.  2.) start amlodipine 5 mg As directed on the bottle.  Your physician recommends that you schedule a follow-up appointment in: 3 months.

## 2016-07-12 ENCOUNTER — Encounter (HOSPITAL_COMMUNITY)
Admission: RE | Admit: 2016-07-12 | Discharge: 2016-07-12 | Disposition: A | Discharge status: Home / Self Care | Payer: Self-pay | Source: Ambulatory Visit | Attending: Cardiovascular Disease | Admitting: Cardiovascular Disease

## 2016-07-12 NOTE — Progress Notes (Signed)
Patient ID: Derek Wells, male   DOB: 03-26-1933, 82 y.o.   MRN: [id number]     HPI: Derek Wells is a 82 y.o. male who presents to the office today for a one year followup cardiology evaluation   Derek Wells has a history of hypertension, hyperlipidemia, and is s/p abdominal aortic aneurysm repair in April 2010. I had seen him on 01/14/2013 at which time he had experienced symptoms worrisome for unstable angina. Previously, in September 2013 he had undergone a nuclear perfusion study  which was essentially normal although did show mild diaphragmatic attenuation. He also been found to have a atherogenic dyslipidemia pattern with reference to his lipid panel. Catheterization the following day on 01/15/2013 revealed life-threatening coronary anatomy with 60-70% ostial left main stenosis, 90-95% distal left main stenosis, 95-99% ostial LAD stenosis, and mild/moderate stenoses in the RCA. That same day he was successfully operated by Dr. Arvid Right and underwent CABG x3 with a LIMA to the LAD, SVG to the OM, and SVG to the PDA. He had a unremarkable postoperative course and was discharged 4 days later.  He saw [name] on 01/27/13 and was doing well. I saw him in followup on 03/02/2013 and he  participating  in cardiac rehabilitation. When I saw him in September 2014, he was in sinus rhythm and has and had resumed a good level of activity.   In January 2015, he was found to be in atrial flutter at cardiac rehabilitation. He was unaware of his heart rhythm being abnormal. He had developed a URI over the holidays prior to that and had just completed a steroid dose pack. Xarelto 20 mg was added to his medical regimen. He did undergo subsequent blood work on January 20 which showed normal renal function and normal electrolytes. Liver function studies were normal. Hemoglobin was 13.7 hematocrit 41.7. Platelets were normal. TSH was normal at 2.63. Magnesium was normal at 2.0.  He underwent successful cardioversion for  his atrial flutter on 11/12/2013 and he cardioverted to sinus rhythm at 100 J.  Since his cardioversion, he is unaware of any recurrent rhythm disturbance.   He is in the maintenance phase of cardiac rehabilitation.  When I  saw him in June 2015, he told me that he had noticed several episodes of a vague chest sensation particularly when he rolls his garbage can back up a hill at home.    On 03/18/2014, he underwent a nuclear perfusion study, which showed reduced exercise capacity with a workload of only 5.9.  He did experience mild shortness of breath.  There was no significant ST depression, but he had developed transient left bundle branch block.  The scan was interpreted at low as low risk.  Ejection fraction was 65% without wall motion abnormalities.  An echo Doppler study showed an EF of 60-65%.  There were no regional wall motion abnormalities.  He had mild biatrial enlargement.  Derek Wells currently is playing golf at least 2 times per week and occasionally 3.  He is in the maintenance phase of cardiac rehabilitation.  He does admit to some weight gain.  He denies any episodes of chest pain, particularly during the cardiac rehabilitation program.  He has noticed some mild shortness of breath with deep breathing and some vague pressure in his chest which is short-lived walking up hills when he walks fast with his wife.  He has noticed some mild shortness of breath walking [place] steep hills in the golf [place] without chest pressure.  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 2016.  When I saw him last year he was anemic and had significantly reduced iron saturation at 6%, and his serum ferritin was 7.  I referred him for GI evaluation and underwent both colonoscopy and endoscopy by Dr. [name]: and 4 polyps were removed from his colon.  He was not having any active bleeding.  He does note improved energy.  He had taken Niferex initially and now is taking over-the-counter iron. He  has more energy.  He is continuing with cardiac rehabilitation.  He underwent a follow-up abdominal ultrasound on 06/09/2015 which showed a patent aortobifemoral bypass graft without focal dilatation or stenosis.   He was recently at the beach for several weeks.  He played golf in the heat.  He is able to play golf without chest pain.  At times if he walks fast.  He still notes chest pressure.  For the past month he has had a cough.  He has a ventral hernia at his incision site and with increasing coughing.  He has noticed some slight progression of this hernia.  He denies any significant pain.  He denies dyspnea but he feels that he is sighing more often. He presents for evaluation.  Past Medical History:  Diagnosis Date  . CAD (coronary artery disease), severe needing CABG 01/19/2013  . Chest pain    2D ECHO, 06/16/2012 - EF 50-55%, borderline low left ventricular systolic function  . Coronary artery disease   . Dyspnea on exertion    LEXISCAN, 06/17/2012 - fixed inferior bowel artifact and basal septal defect, probably related to IVCD/incomplete LBBB, no reversible ischemia, post-stress EF 66%  . History of AAA (abdominal aortic aneurysm) repair    ABDOMINAL AORTIC DOPPLER, 06/16/2013 - normal  . HTN (hypertension) 01/19/2013  . Hyperlipidemia LDL goal < 70 01/19/2013  . Hypertension   . S/P CABG x 3 01/19/2013    Past Surgical History:  Procedure Laterality Date  . CARDIAC CATHETERIZATION  01/15/2013   Severe coronary obstructive disease with 60-70% ostial left main and 90-85% distal LAD stenosis and mild-moderate stenoses in RCA, recommended for CABG  . CARDIOVERSION N/A 11/12/2013   Procedure: CARDIOVERSION;  Surgeon: Troy Sine, MD;  Location: [facility] ENDOSCOPY;  Service: Cardiovascular;  Laterality: N/A;  11:16 Lido 60m, Propofol 1240mIV 11:19 synch 100 joules conversion conversion to NSR....  . CORONARY ARTERY BYPASS GRAFT N/A 01/15/2013   Procedure: Coronary Artery Bypass Graft times  three using Right Greater Saphenous Vein Graft harvested endoscopically and Left Internal Mammary Artery and;  Surgeon: BrGaye PollackMD;  [facility];  Service: Open Heart Surgery;  Laterality: N/A;  . INTRAOPERATIVE TRANSESOPHAGEAL ECHOCARDIOGRAM  01/15/2013   Procedure: INTRAOPERATIVE TRANSESOPHAGEAL ECHOCARDIOGRAM;  Surgeon: BrGaye PollackMD;  [facility];  Service: Open Heart Surgery;;  . LEFT HEART CATHETERIZATION WITH CORONARY ANGIOGRAM N/A 01/15/2013   Procedure: LEFT HEART CATHETERIZATION WITH CORONARY ANGIOGRAM;  Surgeon: ThTroy SineMD;  [facility];  Service: Cardiovascular;  Laterality: N/A;  . VASCULAR SURGERY      Allergies  Allergen Reactions  . Penicillins Rash    Current Outpatient Prescriptions  Medication Sig Dispense Refill  . aspirin 81 MG tablet Take 81 mg by mouth daily.    . Marland Kitchentorvastatin (LIPITOR) 20 MG tablet Take 1 tablet (20 mg total) by mouth daily at 6 PM. Keep appt on October 19th 30 tablet 0  . fish oil-omega-3  fatty acids 1000 MG capsule Take 1 g by mouth daily.    . iron polysaccharides (NIFEREX) 150 MG capsule Take 1 capsule (150 mg total) by mouth 2 (two) times daily. 60 capsule 3  . metFORMIN (GLUCOPHAGE) 500 MG tablet Take 500 mg by mouth daily.    . metoprolol succinate (TOPROL-XL) 25 MG 24 hr tablet Take 1 tablet (25 mg total) by mouth daily. 45 tablet 0  . Multiple Vitamin (MULTIVITAMIN WITH MINERALS) TABS Take 1 tablet by mouth daily.    . valsartan (DIOVAN) 160 MG tablet take 1 tablet by mouth once daily 90 tablet 2  . amLODipine (NORVASC) 5 MG tablet Take 1 tablet (5 mg total) by mouth daily. 30 tablet 11   No current facility-administered medications for this visit.     Socially he is re-married. His first wife of 42 years passed away. He previously had smoked cigars prior to his bypass surgery but has not had any tobacco use since.  He continues to play golf at least 2 times per week.  He does a lot of leg exercise with  cardiac rehabilitation.  ROS General: Negative; No fevers, chills, or night sweats;  HEENT: Negative; No changes in vision or hearing, sinus congestion, difficulty swallowing Pulmonary: URI symptoms during the winter; No cough, wheezing, shortness of breath, hemoptysis Cardiovascular: Mild shortness of breath with uphill walking; see history of present illness GI: Negative; No nausea, vomiting, diarrhea, or abdominal pain GU: Negative; No dysuria, hematuria, or difficulty voiding Musculoskeletal: Negative; no myalgias, joint pain, or weakness Hematologic/Oncology: Negative; no easy bruising, bleeding Endocrine: Negative; no heat/cold intolerance; no diabetes Neuro: Negative; no changes in balance, headaches Skin: Removal of small left forearm melanoma. Psychiatric: Negative; No behavioral problems, depression Sleep: Negative; No snoring, daytime sleepiness, hypersomnolence, bruxism, restless legs, hypnogognic hallucinations, no cataplexy Other comprehensive 14 point system review is negative.   PE BP (!) 142/68   Pulse 66   Ht '6\' 1"'  (1.854 m)   Wt 249 lb 12.8 oz (113.3 kg)   BMI 32.96 kg/m    Repeat blood pressure 140/70  Wt Readings from Last 3 Encounters:  07/11/16 249 lb 12.8 oz (113.3 kg)  06/19/15 245 lb 3.2 oz (111.2 kg)  03/02/15 244 lb 12.8 oz (111 kg)   General: Alert, oriented, no distress.  Skin: normal turgor, no rashes HEENT: Normocephalic, atraumatic. Pupils round and reactive; sclera anicteric;no lid lag.  Nose without nasal septal hypertrophy Mouth/Parynx benign; Mallinpatti scale 2 Neck: No JVD, no carotid bruits with normal carotid upstrokes Lungs: clear to ausculatation and percussion; no wheezing or rales Chest wall: No tenderness to palpation Heart: PMI not displaced; regular rate and rhythm. s1 s2 normal  4-1/3 systolic murmur ; no S3. No rub. Abdomen: soft, nontender; no hepatosplenomehaly, BS+; abdominal aorta nontender and not dilated by  palpation. Back: No CVA tenderness Pulses 2+ Extremities: no clubbing cyanosis or edema, Homan's sign negative  Neurologic: grossly nonfocal; cranial nerves grossly intact Psychological: Normal affect and mood  ECG (independently read by me): Normal sinus rhythm at 66 bpm.  First-degree block with a PR interval at 290 ms.  Nonspecific interventricular block.  03/02/2015 ECG (independently read by me): Sinus rhythm at 75 bpm.  Occasional unifocal PVCs.  Nonspecific interventricular block.  Prior October 2015 ECG (independently read by me): Normal sinus rhythm at 67 beats per minute.  First degree A-V block with PR interval at 290 ms.  QTc interval 439 ms.  No ST segment changes.  Prior  June 2015 ECG (independently read by me): Normal sinus rhythm at 68 beats per minute with first degree AV block with a PR interval at 284 ms.  QTc interval 435 ms.  No significant ST-T changes.  T-wave inversion in aVL  ECG today (independently read by me):  Normal sinus rhythm at 71 beats per minute. First create a block with PR interval at 240 ms. QTc interval normal at 4-5 ms.  Prior 10/27/13 ECG (independently read by me): Atrial flutter with variable rate but predominantly 4-1 with occasional 3-1 block; average heart rate 70 beats per minute  Prior ECG from 06/04/2013: Sinus rhythm with first-degree AV block. Nonspecific interventricular block  LABS: BMP Latest Ref Rng & Units 03/01/2015 03/07/2014 11/08/2013  Glucose 70 - 99 mg/dL 107(H) 105(H) 108(H)  BUN 6 - 23 mg/dL '18 17 15  ' Creatinine 0.50 - 1.35 mg/dL 0.93 1.08 0.96  Sodium 135 - 145 mEq/L 143 137 140  Potassium 3.5 - 5.3 mEq/L 4.8 4.4 4.1  Chloride 96 - 112 mEq/L 104 103 105  CO2 19 - 32 mEq/L '24 25 25  ' Calcium 8.4 - 10.5 mg/dL 9.1 9.5 9.2   Hepatic Function Latest Ref Rng & Units 03/01/2015 03/07/2014 10/12/2013  Total Protein 6.0 - 8.3 g/dL 6.5 6.8 6.4  Albumin 3.5 - 5.2 g/dL 3.9 4.2 4.0  AST 0 - 37 U/L '15 18 14  ' ALT 0 - 53 U/L '13 16 16  ' Alk  Phosphatase 39 - 117 U/L 56 59 60  Total Bilirubin 0.2 - 1.2 mg/dL 0.7 0.7 0.7   CBC Latest Ref Rng & Units 06/19/2015 03/07/2015 03/01/2015  WBC 4.0 - 10.5 K/uL CANCELED 8.5 8.1  Hemoglobin 13.0 - 17.0 g/dL CANCELED 10.0(L) 9.7(L)  Hematocrit 39.0 - 52.0 % CANCELED 32.7(L) 31.5(L)  Platelets 150 - 400 K/uL CANCELED 345 309   Lab Results  Component Value Date   MCV CANCELED 06/19/2015   MCV 77.9 (L) 03/07/2015   MCV 76.3 (L) 03/01/2015   Lab Results  Component Value Date   TSH 2.400 06/19/2015   Lipid Panel     Component Value Date/Time   CHOL 160 06/19/2015 1023   CHOL 111 03/01/2015 1033   TRIG 157 (H) 06/19/2015 1023   TRIG 90 03/01/2015 1033   HDL 35 (L) 06/19/2015 1023   HDL 41 03/01/2015 1033   CHOLHDL 4.6 06/19/2015 1023   VLDL 31 (H) 06/19/2015 1023   LDLCALC 94 06/19/2015 1023   LDLCALC 52 03/01/2015 1033     RADIOLOGY: No results found.    ASSESSMENT AND PLAN: Derek Wells is an 82 year old white male who is status post CABG revascularization surgery done emergently after cardiac catheterization on 01/15/2013 demonstrated severe life threatening coronary anatomy.   He  is in the maintenance phase of cardiac rehabilitation. He  developed atrial flutter in January 2015 and was started on anticoagulation with Xarelto. He is now status post successful DC cardioversion and he is maintaining normal sinus rhythm with first degree AV block.  His blood pressure today is upper normal.  On valsartan 160 mg, and Toprol.  Last year he was found  to be anemic and had significant iron deficiency at 6% with a very low ferritin at 7.  His Xarelto was discontinued and is now on aspirin 81 mg.  He underwent complete GI evaluation by Dr. Oletta Lamas and has been on iron replacement.  His ECG today shows sinus rhythm with PAC.  His first-degree heart block has progressed and PR interval  now measures 306 ms.  I have suggested he reduce Lopressor back to 25 mg daily, but am adding amlodipine 5 mg  daily to his medical regimen since he has experienced episodes of chest tightness.  I am scheduling him for a follow-up Lexiscan Myoview study to make certain there is no change from his previous evaluation in light of his symptoms.  He continues to take metformin for type 2 diabetes mellitus.  He is on atorvastatin 20 mg daily for hyperlipidemia with target LDL less than 70.  With his incisional ventral hernia, have suggested he may consider an evaluation with Dr. Dalbert Batman who had operated on him previously.  I will see him back in follow-up of his stress study and further conditions were made at that time.  Troy Sine, MD, [facility]  07/12/2016 1:03 PM

## 2016-07-14 ENCOUNTER — Other Ambulatory Visit: Payer: Self-pay | Admitting: Cardiovascular Disease

## 2016-07-15 ENCOUNTER — Encounter (HOSPITAL_COMMUNITY)
Admission: RE | Admit: 2016-07-15 | Discharge: 2016-07-15 | Disposition: A | Discharge status: Home / Self Care | Payer: Self-pay | Source: Ambulatory Visit | Attending: Cardiovascular Disease | Admitting: Cardiovascular Disease

## 2016-07-17 ENCOUNTER — Encounter (HOSPITAL_COMMUNITY)
Admission: RE | Admit: 2016-07-17 | Discharge: 2016-07-17 | Disposition: A | Discharge status: Home / Self Care | Payer: Self-pay | Source: Ambulatory Visit | Attending: Cardiovascular Disease | Admitting: Cardiovascular Disease

## 2016-07-17 DIAGNOSIS — Z23 Encounter for immunization: Secondary | ICD-10-CM | POA: Diagnosis not present

## 2016-07-18 ENCOUNTER — Telehealth (HOSPITAL_COMMUNITY): Payer: Self-pay

## 2016-07-18 ENCOUNTER — Other Ambulatory Visit: Payer: Self-pay | Admitting: Cardiovascular Disease

## 2016-07-18 NOTE — Telephone Encounter (Signed)
Encounter complete. 

## 2016-07-19 ENCOUNTER — Encounter (HOSPITAL_COMMUNITY)
Admission: RE | Admit: 2016-07-19 | Discharge: 2016-07-19 | Disposition: A | Discharge status: Home / Self Care | Payer: Self-pay | Source: Ambulatory Visit | Attending: Cardiovascular Disease | Admitting: Cardiovascular Disease

## 2016-07-19 DIAGNOSIS — E785 Hyperlipidemia, unspecified: Secondary | ICD-10-CM | POA: Diagnosis not present

## 2016-07-19 DIAGNOSIS — Z79899 Other long term (current) drug therapy: Secondary | ICD-10-CM | POA: Diagnosis not present

## 2016-07-19 DIAGNOSIS — I2510 Atherosclerotic heart disease of native coronary artery without angina pectoris: Secondary | ICD-10-CM | POA: Diagnosis not present

## 2016-07-19 DIAGNOSIS — D509 Iron deficiency anemia, unspecified: Secondary | ICD-10-CM | POA: Diagnosis not present

## 2016-07-19 DIAGNOSIS — I10 Essential (primary) hypertension: Secondary | ICD-10-CM | POA: Diagnosis not present

## 2016-07-19 DIAGNOSIS — I4892 Unspecified atrial flutter: Secondary | ICD-10-CM | POA: Diagnosis not present

## 2016-07-19 LAB — COMPREHENSIVE METABOLIC PANEL
ALK PHOS: 52 U/L (ref 40–115)
ALT: 17 U/L (ref 9–46)
AST: 18 U/L (ref 10–35)
Albumin: 4.2 g/dL (ref 3.6–5.1)
BUN: 19 mg/dL (ref 7–25)
CALCIUM: 9.3 mg/dL (ref 8.6–10.3)
CHLORIDE: 103 mmol/L (ref 98–110)
CO2: 27 mmol/L (ref 20–31)
Creat: 0.9 mg/dL (ref 0.70–1.11)
Glucose, Bld: 115 mg/dL — ABNORMAL HIGH (ref 65–99)
POTASSIUM: 4.8 mmol/L (ref 3.5–5.3)
Sodium: 140 mmol/L (ref 135–146)
TOTAL PROTEIN: 6.6 g/dL (ref 6.1–8.1)
Total Bilirubin: 0.9 mg/dL (ref 0.2–1.2)

## 2016-07-19 LAB — CBC
HEMATOCRIT: 43.7 % (ref 38.5–50.0)
Hemoglobin: 14.7 g/dL (ref 13.2–17.1)
MCH: 30.8 pg (ref 27.0–33.0)
MCHC: 33.6 g/dL (ref 32.0–36.0)
MCV: 91.4 fL (ref 80.0–100.0)
MPV: 10.8 fL (ref 7.5–12.5)
Platelets: 266 10*3/uL (ref 140–400)
RBC: 4.78 MIL/uL (ref 4.20–5.80)
RDW: 14.8 % (ref 11.0–15.0)
WBC: 6.5 10*3/uL (ref 3.8–10.8)

## 2016-07-19 LAB — LIPID PANEL
CHOL/HDL RATIO: 4.1 ratio (ref <=5.0)
CHOLESTEROL: 164 mg/dL (ref 125–200)
HDL: 40 mg/dL (ref >=40)
LDL Cholesterol: 100 mg/dL (ref <130)
Triglycerides: 122 mg/dL (ref <150)
VLDL: 24 mg/dL (ref <30)

## 2016-07-19 LAB — TSH: TSH: 2.35 mIU/L (ref 0.40–4.50)

## 2016-07-19 NOTE — Telephone Encounter (Signed)
Rx(s) sent to pharmacy electronically.  

## 2016-07-20 LAB — IRON AND TIBC
%SAT: 27 % (ref 15–60)
IRON: 81 ug/dL (ref 50–180)
TIBC: 302 ug/dL (ref 250–425)
UIBC: 221 ug/dL (ref 125–400)

## 2016-07-22 ENCOUNTER — Encounter (HOSPITAL_COMMUNITY): Payer: Self-pay

## 2016-07-23 ENCOUNTER — Telehealth: Payer: Self-pay | Admitting: *Deleted

## 2016-07-23 ENCOUNTER — Ambulatory Visit (HOSPITAL_COMMUNITY)
Admission: RE | Admit: 2016-07-23 | Discharge: 2016-07-23 | Disposition: A | Discharge status: Home / Self Care | Payer: Medicare Other | Source: Ambulatory Visit | Attending: Cardiovascular Disease | Admitting: Cardiovascular Disease

## 2016-07-23 DIAGNOSIS — I252 Old myocardial infarction: Secondary | ICD-10-CM | POA: Diagnosis not present

## 2016-07-23 DIAGNOSIS — Z87891 Personal history of nicotine dependence: Secondary | ICD-10-CM | POA: Diagnosis not present

## 2016-07-23 DIAGNOSIS — I4891 Unspecified atrial fibrillation: Secondary | ICD-10-CM | POA: Insufficient documentation

## 2016-07-23 DIAGNOSIS — I447 Left bundle-branch block, unspecified: Secondary | ICD-10-CM | POA: Diagnosis not present

## 2016-07-23 DIAGNOSIS — Z8249 Family history of ischemic heart disease and other diseases of the circulatory system: Secondary | ICD-10-CM | POA: Diagnosis not present

## 2016-07-23 DIAGNOSIS — Z951 Presence of aortocoronary bypass graft: Secondary | ICD-10-CM | POA: Diagnosis not present

## 2016-07-23 DIAGNOSIS — I2510 Atherosclerotic heart disease of native coronary artery without angina pectoris: Secondary | ICD-10-CM

## 2016-07-23 DIAGNOSIS — I10 Essential (primary) hypertension: Secondary | ICD-10-CM | POA: Diagnosis not present

## 2016-07-23 DIAGNOSIS — I4892 Unspecified atrial flutter: Secondary | ICD-10-CM | POA: Diagnosis not present

## 2016-07-23 DIAGNOSIS — I714 Abdominal aortic aneurysm, without rupture: Secondary | ICD-10-CM | POA: Diagnosis not present

## 2016-07-23 LAB — MYOCARDIAL PERFUSION IMAGING
CHL CUP RESTING HR STRESS: 63 {beats}/min
LVDIAVOL: 128 mL (ref 62–150)
LVSYSVOL: 49 mL
NUC STRESS TID: 1.19
Peak HR: 78 {beats}/min
SDS: 1
SRS: 3
SSS: 4

## 2016-07-23 MED ORDER — TECHNETIUM TC 99M TETROFOSMIN IV KIT
30.5000 | PACK | Freq: Once | INTRAVENOUS | Status: AC | PRN
  Administered 2016-07-23: 30.5 via INTRAVENOUS
  Filled 2016-07-23: qty 31

## 2016-07-23 MED ORDER — TECHNETIUM TC 99M TETROFOSMIN IV KIT
10.8000 | PACK | Freq: Once | INTRAVENOUS | Status: AC | PRN
  Administered 2016-07-23: 10.8 via INTRAVENOUS
  Filled 2016-07-23: qty 11

## 2016-07-23 MED ORDER — REGADENOSON 0.4 MG/5ML IV SOLN
0.4000 mg | Freq: Once | INTRAVENOUS | Status: AC
  Administered 2016-07-23: 0.4 mg via INTRAVENOUS

## 2016-07-23 NOTE — Telephone Encounter (Signed)
-----   Message from Lennette Biharihomas A Kelly, MD sent at 07/22/2016  9:47 AM EDT ----- Labs good but LDL increased to 100; increase atorvastatin to 40 mg; if cannot tolerate increased dose then start zetia 10 mg to atorva 20 mg.

## 2016-07-23 NOTE — Telephone Encounter (Signed)
Left detailed message on patient's cell voice mail. He is to call me back to let me know his decision on what he wants to do about the cholesterol meds. I also released the results into my chart.

## 2016-07-24 ENCOUNTER — Encounter (HOSPITAL_COMMUNITY): Payer: Self-pay

## 2016-07-24 DIAGNOSIS — I2510 Atherosclerotic heart disease of native coronary artery without angina pectoris: Secondary | ICD-10-CM | POA: Insufficient documentation

## 2016-07-26 ENCOUNTER — Encounter (HOSPITAL_COMMUNITY): Payer: Self-pay

## 2016-07-29 ENCOUNTER — Encounter (HOSPITAL_COMMUNITY)
Admission: RE | Admit: 2016-07-29 | Discharge: 2016-07-29 | Disposition: A | Discharge status: Home / Self Care | Payer: Self-pay | Source: Ambulatory Visit | Attending: Cardiovascular Disease | Admitting: Cardiovascular Disease

## 2016-07-31 ENCOUNTER — Encounter (HOSPITAL_COMMUNITY): Payer: Self-pay

## 2016-08-02 ENCOUNTER — Encounter (HOSPITAL_COMMUNITY)
Admission: RE | Admit: 2016-08-02 | Discharge: 2016-08-02 | Disposition: A | Discharge status: Home / Self Care | Payer: Self-pay | Source: Ambulatory Visit | Attending: Cardiovascular Disease | Admitting: Cardiovascular Disease

## 2016-08-05 ENCOUNTER — Encounter (HOSPITAL_COMMUNITY)
Admission: RE | Admit: 2016-08-05 | Discharge: 2016-08-05 | Disposition: A | Discharge status: Home / Self Care | Payer: Self-pay | Source: Ambulatory Visit | Attending: Cardiovascular Disease | Admitting: Cardiovascular Disease

## 2016-08-07 ENCOUNTER — Encounter (HOSPITAL_COMMUNITY): Payer: Self-pay

## 2016-08-09 ENCOUNTER — Encounter (HOSPITAL_COMMUNITY)
Admission: RE | Admit: 2016-08-09 | Discharge: 2016-08-09 | Disposition: A | Discharge status: Home / Self Care | Payer: Self-pay | Source: Ambulatory Visit | Attending: Cardiovascular Disease | Admitting: Cardiovascular Disease

## 2016-08-12 ENCOUNTER — Encounter (HOSPITAL_COMMUNITY): Payer: Self-pay

## 2016-08-14 ENCOUNTER — Encounter (HOSPITAL_COMMUNITY): Payer: Self-pay

## 2016-08-19 ENCOUNTER — Encounter (HOSPITAL_COMMUNITY)
Admission: RE | Admit: 2016-08-19 | Discharge: 2016-08-19 | Disposition: A | Discharge status: Home / Self Care | Payer: Self-pay | Source: Ambulatory Visit | Attending: Cardiovascular Disease | Admitting: Cardiovascular Disease

## 2016-08-21 ENCOUNTER — Encounter (HOSPITAL_COMMUNITY)
Admission: RE | Admit: 2016-08-21 | Discharge: 2016-08-21 | Disposition: A | Discharge status: Home / Self Care | Payer: Self-pay | Source: Ambulatory Visit | Attending: Cardiovascular Disease | Admitting: Cardiovascular Disease

## 2016-08-23 ENCOUNTER — Encounter (HOSPITAL_COMMUNITY): Payer: Self-pay

## 2016-08-23 DIAGNOSIS — I2510 Atherosclerotic heart disease of native coronary artery without angina pectoris: Secondary | ICD-10-CM | POA: Insufficient documentation

## 2016-08-26 ENCOUNTER — Encounter (HOSPITAL_COMMUNITY)
Admission: RE | Admit: 2016-08-26 | Discharge: 2016-08-26 | Disposition: A | Discharge status: Home / Self Care | Payer: Self-pay | Source: Ambulatory Visit | Attending: Cardiovascular Disease | Admitting: Cardiovascular Disease

## 2016-08-28 ENCOUNTER — Encounter (HOSPITAL_COMMUNITY): Payer: Self-pay

## 2016-08-30 ENCOUNTER — Encounter (HOSPITAL_COMMUNITY): Payer: Self-pay

## 2016-09-02 ENCOUNTER — Encounter (HOSPITAL_COMMUNITY): Payer: Self-pay

## 2016-09-04 ENCOUNTER — Encounter (HOSPITAL_COMMUNITY): Payer: Self-pay

## 2016-09-06 ENCOUNTER — Encounter (HOSPITAL_COMMUNITY): Payer: Self-pay

## 2016-09-09 ENCOUNTER — Encounter (HOSPITAL_COMMUNITY)
Admission: RE | Admit: 2016-09-09 | Discharge: 2016-09-09 | Disposition: A | Discharge status: Home / Self Care | Payer: Self-pay | Source: Ambulatory Visit | Attending: Cardiovascular Disease | Admitting: Cardiovascular Disease

## 2016-09-11 ENCOUNTER — Encounter (HOSPITAL_COMMUNITY)
Admission: RE | Admit: 2016-09-11 | Discharge: 2016-09-11 | Disposition: A | Discharge status: Home / Self Care | Payer: Self-pay | Source: Ambulatory Visit | Attending: Cardiovascular Disease | Admitting: Cardiovascular Disease

## 2016-09-13 ENCOUNTER — Encounter (HOSPITAL_COMMUNITY): Payer: Self-pay

## 2016-09-18 ENCOUNTER — Encounter (HOSPITAL_COMMUNITY): Payer: Self-pay

## 2016-09-20 ENCOUNTER — Encounter (HOSPITAL_COMMUNITY): Payer: Self-pay

## 2016-09-25 ENCOUNTER — Encounter (HOSPITAL_COMMUNITY): Payer: Self-pay

## 2016-09-25 DIAGNOSIS — I2510 Atherosclerotic heart disease of native coronary artery without angina pectoris: Secondary | ICD-10-CM | POA: Insufficient documentation

## 2016-09-27 ENCOUNTER — Encounter (HOSPITAL_COMMUNITY): Payer: Self-pay

## 2016-09-30 ENCOUNTER — Encounter (HOSPITAL_COMMUNITY): Payer: Self-pay

## 2016-10-02 ENCOUNTER — Encounter (HOSPITAL_COMMUNITY): Payer: Self-pay

## 2016-10-02 ENCOUNTER — Ambulatory Visit (INDEPENDENT_AMBULATORY_CARE_PROVIDER_SITE_OTHER): Payer: Medicare Other | Admitting: Cardiovascular Disease

## 2016-10-02 VITALS — BP 130/60 | HR 68 | Ht 73.0 in | Wt 252.4 lb

## 2016-10-02 DIAGNOSIS — K432 Incisional hernia without obstruction or gangrene: Secondary | ICD-10-CM

## 2016-10-02 DIAGNOSIS — E785 Hyperlipidemia, unspecified: Secondary | ICD-10-CM

## 2016-10-02 DIAGNOSIS — I2510 Atherosclerotic heart disease of native coronary artery without angina pectoris: Secondary | ICD-10-CM

## 2016-10-02 DIAGNOSIS — Z951 Presence of aortocoronary bypass graft: Secondary | ICD-10-CM | POA: Diagnosis not present

## 2016-10-02 DIAGNOSIS — E1159 Type 2 diabetes mellitus with other circulatory complications: Secondary | ICD-10-CM

## 2016-10-02 DIAGNOSIS — Z9889 Other specified postprocedural states: Secondary | ICD-10-CM

## 2016-10-02 DIAGNOSIS — I10 Essential (primary) hypertension: Secondary | ICD-10-CM | POA: Diagnosis not present

## 2016-10-02 DIAGNOSIS — Z8679 Personal history of other diseases of the circulatory system: Secondary | ICD-10-CM

## 2016-10-02 DIAGNOSIS — D509 Iron deficiency anemia, unspecified: Secondary | ICD-10-CM

## 2016-10-02 MED ORDER — EZETIMIBE 10 MG PO TABS: 10.0000 mg | ORAL_TABLET | Freq: Every day | ORAL | 3 refills | Status: DC

## 2016-10-02 NOTE — Patient Instructions (Signed)
Medication Instructions:   START new prescription for generic zetia. Labwork:  3 months  Testing/Procedures:  N/A  Follow-Up:  4 months  Any Other Special Instructions Will Be Listed Below (If Applicable).

## 2016-10-03 ENCOUNTER — Encounter: Payer: Self-pay | Admitting: Cardiovascular Disease

## 2016-10-03 NOTE — Progress Notes (Signed)
Patient ID: Derek Wells, male   DOB: [birth year], 83 y.o.   MRN: [id number]     HPI: Derek Wells is a 83 y.o. male who presents to the office today for a 3 MONTH followup cardiology evaluation   Derek Wells has a history of hypertension, hyperlipidemia, and is s/p abdominal aortic aneurysm repair in April 2010. I had seen him on 01/14/2013 at which time he had experienced symptoms worrisome for unstable angina. Previously, in September 2013 he had undergone a nuclear perfusion study  which was essentially normal although did show mild diaphragmatic attenuation. He also been found to have a atherogenic dyslipidemia pattern with reference to his lipid panel. Catheterization the following day on 01/15/2013 revealed life-threatening coronary anatomy with 60-70% ostial left main stenosis, 90-95% distal left main stenosis, 95-99% ostial LAD stenosis, and mild/moderate stenoses in the RCA. That same day he was successfully operated by Dr. Arvid Right and underwent CABG x3 with a LIMA to the LAD, SVG to the OM, and SVG to the PDA. He had a unremarkable postoperative course and was discharged 4 days later.  He saw [name] on 01/27/13 and was doing well. I saw him in followup on 03/02/2013 and he  participating  in cardiac rehabilitation. When I saw him in September 2014, he was in sinus rhythm and has and had resumed a good level of activity.   In January 2015, he was found to be in atrial flutter at cardiac rehabilitation. He was unaware of his heart rhythm being abnormal. He had developed a URI over the holidays prior to that and had just completed a steroid dose pack. Xarelto 20 mg was added to his medical regimen. He did undergo subsequent blood work on January 20 which showed normal renal function and normal electrolytes. Liver function studies were normal. Hemoglobin was 13.7 hematocrit 41.7. Platelets were normal. TSH was normal at 2.63. Magnesium was normal at 2.0.  He underwent successful cardioversion for  his atrial flutter on 11/12/2013 and he cardioverted to sinus rhythm at 100 J.  Since his cardioversion, he is unaware of any recurrent rhythm disturbance.   He is in the maintenance phase of cardiac rehabilitation.  When I  saw him in June 2015, he told me that he had noticed several episodes of a vague chest sensation particularly when he rolls his garbage can back up a hill at home.    On 03/18/2014, he underwent a nuclear perfusion study, which showed reduced exercise capacity with a workload of only 5.9.  He did experience mild shortness of breath.  There was no significant ST depression, but he had developed transient left bundle branch block.  The scan was interpreted at low as low risk.  Ejection fraction was 65% without wall motion abnormalities.  An echo Doppler study showed an EF of 60-65%.  There were no regional wall motion abnormalities.  He had mild biatrial enlargement.  Derek Wells currently is playing golf at least 2 times per week and occasionally 3.  He is in the maintenance phase of cardiac rehabilitation.  He does admit to some weight gain.  He denies any episodes of chest pain, particularly during the cardiac rehabilitation program.  He has noticed some mild shortness of breath with deep breathing and some vague pressure in his chest which is short-lived walking up hills when he walks fast with his wife.  He has noticed some mild shortness of breath walking [place] steep hills in the golf [place] without chest pressure.  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 2016.  When I saw him last year he was anemic and had significantly reduced iron saturation at 6%, and his serum ferritin was 7.  I referred him for GI evaluation and underwent both colonoscopy and endoscopy by Dr. [name]: and 4 polyps were removed from his colon.  He was not having any active bleeding.  He does note improved energy.  He had taken Niferex initially and now is taking over-the-counter iron. He  has more energy.  He is continuing with cardiac rehabilitation.  He underwent a follow-up abdominal ultrasound on 06/09/2015 which showed a patent aortobifemoral bypass graft without focal dilatation or stenosis.   Over the past summer he was at the beach for several weeks.  He played golf in the heat.  He is able to play golf without chest pain.  However with fast walking he noted  chest pressure.  For the past month he has had a cough.  He has a ventral hernia.  When I last saw him, his ECG showed progressive PR prolongation.  I reduced his Lopressor back to 25 mg daily and added amlodipine 5 mg blood pressure and his chest tightness.  I scheduled him for a nuclear perfusion study which was done on 07/23/2016.  Ejection fraction was 62%.  There were no ECG changes ischemia, but there was a rare PVC.  He had normal perfusion without scar or ischemia.  His follow-up laboratory showed significant improvement with his iron saturation, now at 27%.  Lipid studies revealed a total cholesterol 164, triglycerides 122, HDL 40, and LDL 100.  He presents for evaluation.   Past Medical History:  Diagnosis Date  . CAD (coronary artery disease), severe needing CABG 01/19/2013  . Chest pain    2D ECHO, 06/16/2012 - EF 50-55%, borderline low left ventricular systolic function  . Coronary artery disease   . Dyspnea on exertion    LEXISCAN, 06/17/2012 - fixed inferior bowel artifact and basal septal defect, probably related to IVCD/incomplete LBBB, no reversible ischemia, post-stress EF 66%  . History of AAA (abdominal aortic aneurysm) repair    ABDOMINAL AORTIC DOPPLER, 06/16/2013 - normal  . HTN (hypertension) 01/19/2013  . Hyperlipidemia LDL goal < 70 01/19/2013  . Hypertension   . S/P CABG x 3 01/19/2013    Past Surgical History:  Procedure Laterality Date  . CARDIAC CATHETERIZATION  01/15/2013   Severe coronary obstructive disease with 60-70% ostial left main and 90-85% distal LAD stenosis and mild-moderate  stenoses in RCA, recommended for CABG  . CARDIOVERSION N/A 11/12/2013   Procedure: CARDIOVERSION;  Surgeon: Troy Sine, MD;  Location: [facility] ENDOSCOPY;  Service: Cardiovascular;  Laterality: N/A;  11:16 Lido 105m, Propofol 1215mIV 11:19 synch 100 joules conversion conversion to NSR....  . CORONARY ARTERY BYPASS GRAFT N/A 01/15/2013   Procedure: Coronary Artery Bypass Graft times three using Right Greater Saphenous Vein Graft harvested endoscopically and Left Internal Mammary Artery and;  Surgeon: BrGaye PollackMD;  [facility];  Service: Open Heart Surgery;  Laterality: N/A;  . INTRAOPERATIVE TRANSESOPHAGEAL ECHOCARDIOGRAM  01/15/2013   Procedure: INTRAOPERATIVE TRANSESOPHAGEAL ECHOCARDIOGRAM;  Surgeon: BrGaye PollackMD;  [facility];  Service: Open Heart Surgery;;  . LEFT HEART CATHETERIZATION WITH CORONARY ANGIOGRAM N/A 01/15/2013   Procedure: LEFT HEART CATHETERIZATION WITH CORONARY ANGIOGRAM;  Surgeon: ThTroy SineMD;  [facility];  Service: Cardiovascular;  Laterality: N/A;  . VASCULAR SURGERY  Allergies  Allergen Reactions  . Penicillins Rash    Current Outpatient Prescriptions  Medication Sig Dispense Refill  . amLODipine (NORVASC) 5 MG tablet Take 1 tablet (5 mg total) by mouth daily. 30 tablet 11  . aspirin 81 MG tablet Take 81 mg by mouth daily.    Marland Kitchen atorvastatin (LIPITOR) 20 MG tablet Take 1 tablet (20 mg total) by mouth daily. 30 tablet 11  . fish oil-omega-3 fatty acids 1000 MG capsule Take 1 g by mouth daily.    . iron polysaccharides (NIFEREX) 150 MG capsule Take 1 capsule (150 mg total) by mouth 2 (two) times daily. 60 capsule 3  . metFORMIN (GLUCOPHAGE) 500 MG tablet Take 500 mg by mouth daily.    . metoprolol succinate (TOPROL-XL) 25 MG 24 hr tablet Take 1 tablet (25 mg total) by mouth daily. 30 tablet 10  . Multiple Vitamin (MULTIVITAMIN WITH MINERALS) TABS Take 1 tablet by mouth daily.    . valsartan (DIOVAN) 160 MG tablet take 1 tablet by  mouth once daily 90 tablet 2  . ezetimibe (ZETIA) 10 MG tablet Take 1 tablet (10 mg total) by mouth daily. 90 tablet 3   No current facility-administered medications for this visit.     Socially he is re-married. His first wife of 42 years passed away. He previously had smoked cigars prior to his bypass surgery but has not had any tobacco use since.  He continues to play golf at least 2 times per week.  He does a lot of leg exercise with cardiac rehabilitation.  ROS General: Negative; No fevers, chills, or night sweats;  HEENT: Negative; No changes in vision or hearing, sinus congestion, difficulty swallowing Pulmonary: URI symptoms during the winter; No cough, wheezing, shortness of breath, hemoptysis Cardiovascular: Mild shortness of breath with uphill walking; see history of present illness GI: Negative; No nausea, vomiting, diarrhea, or abdominal pain GU: Negative; No dysuria, hematuria, or difficulty voiding Musculoskeletal: Negative; no myalgias, joint pain, or weakness Hematologic/Oncology: Negative; no easy bruising, bleeding Endocrine: Negative; no heat/cold intolerance; no diabetes Neuro: Negative; no changes in balance, headaches Skin: Removal of small left forearm melanoma. Psychiatric: Negative; No behavioral problems, depression Sleep: Negative; No snoring, daytime sleepiness, hypersomnolence, bruxism, restless legs, hypnogognic hallucinations, no cataplexy Other comprehensive 14 point system review is negative.   PE BP 130/60   Pulse 68   Ht '6\' 1"'  (1.854 m)   Wt 252 lb 6.4 oz (114.5 kg)   BMI 33.30 kg/m    Repeat blood pressure 140/70  Wt Readings from Last 3 Encounters:  10/02/16 252 lb 6.4 oz (114.5 kg)  07/23/16 249 lb (112.9 kg)  07/11/16 249 lb 12.8 oz (113.3 kg)   General: Alert, oriented, no distress.  Skin: normal turgor, no rashes HEENT: Normocephalic, atraumatic. Pupils round and reactive; sclera anicteric;no lid lag.  Nose without nasal septal  hypertrophy Mouth/Parynx benign; Mallinpatti scale 2 Neck: No JVD, no carotid bruits with normal carotid upstrokes Lungs: clear to ausculatation and percussion; no wheezing or rales Chest wall: No tenderness to palpation Heart: PMI not displaced; regular rate and rhythm. s1 s2 normal  1-1/1 systolic murmur ; no S3. No rub. Abdomen: soft, nontender; no hepatosplenomehaly, BS+; abdominal aorta nontender and not dilated by palpation. Back: No CVA tenderness Pulses 2+ Extremities: no clubbing cyanosis or edema, Homan's sign negative  Neurologic: grossly nonfocal; cranial nerves grossly intact Psychological: Normal affect and mood  ECG (independently read by me): Normal sinus rhythm with first-degree AV block.  PR interval 300 ms.  Nonspecific interventricular conduction delay.  October 2017 ECG (independently read by me): Normal sinus rhythm at 66 bpm.  First-degree block with a PR interval at 290 ms.  Nonspecific interventricular block.  03/02/2015 ECG (independently read by me): Sinus rhythm at 75 bpm.  Occasional unifocal PVCs.  Nonspecific interventricular block.  Prior October 2015 ECG (independently read by me): Normal sinus rhythm at 67 beats per minute.  First degree A-V block with PR interval at 290 ms.  QTc interval 439 ms.  No ST segment changes.  Prior June 2015 ECG (independently read by me): Normal sinus rhythm at 68 beats per minute with first degree AV block with a PR interval at 284 ms.  QTc interval 435 ms.  No significant ST-T changes.  T-wave inversion in aVL  ECG today (independently read by me):  Normal sinus rhythm at 71 beats per minute. First create a block with PR interval at 240 ms. QTc interval normal at 4-5 ms.  Prior 10/27/13 ECG (independently read by me): Atrial flutter with variable rate but predominantly 4-1 with occasional 3-1 block; average heart rate 70 beats per minute  Prior ECG from 06/04/2013: Sinus rhythm with first-degree AV block. Nonspecific  interventricular block  LABS: BMP Latest Ref Rng & Units 07/19/2016 03/01/2015 03/07/2014  Glucose 65 - 99 mg/dL 115(H) 107(H) 105(H)  BUN 7 - 25 mg/dL '19 18 17  ' Creatinine 0.70 - 1.11 mg/dL 0.90 0.93 1.08  Sodium 135 - 146 mmol/L 140 143 137  Potassium 3.5 - 5.3 mmol/L 4.8 4.8 4.4  Chloride 98 - 110 mmol/L 103 104 103  CO2 20 - 31 mmol/L '27 24 25  ' Calcium 8.6 - 10.3 mg/dL 9.3 9.1 9.5   Hepatic Function Latest Ref Rng & Units 07/19/2016 03/01/2015 03/07/2014  Total Protein 6.1 - 8.1 g/dL 6.6 6.5 6.8  Albumin 3.6 - 5.1 g/dL 4.2 3.9 4.2  AST 10 - 35 U/L '18 15 18  ' ALT 9 - 46 U/L '17 13 16  ' Alk Phosphatase 40 - 115 U/L 52 56 59  Total Bilirubin 0.2 - 1.2 mg/dL 0.9 0.7 0.7   CBC Latest Ref Rng & Units 07/19/2016 06/19/2015 03/07/2015  WBC 3.8 - 10.8 K/uL 6.5 CANCELED 8.5  Hemoglobin 13.2 - 17.1 g/dL 14.7 CANCELED 10.0(L)  Hematocrit 38.5 - 50.0 % 43.7 CANCELED 32.7(L)  Platelets 140 - 400 K/uL 266 CANCELED 345   Lab Results  Component Value Date   MCV 91.4 07/19/2016   MCV CANCELED 06/19/2015   MCV 77.9 (L) 03/07/2015   Lab Results  Component Value Date   TSH 2.35 07/19/2016   Lipid Panel     Component Value Date/Time   CHOL 164 07/19/2016 0949   CHOL 111 03/01/2015 1033   TRIG 122 07/19/2016 0949   TRIG 90 03/01/2015 1033   HDL 40 07/19/2016 0949   HDL 41 03/01/2015 1033   CHOLHDL 4.1 07/19/2016 0949   VLDL 24 07/19/2016 0949   LDLCALC 100 07/19/2016 0949   LDLCALC 52 03/01/2015 1033     RADIOLOGY: No results found.  IMPRESSION:  1. Essential hypertension   2. Coronary artery disease involving native coronary artery of native heart without angina pectoris   3. Hyperlipidemia with target LDL less than 70   4. S/P CABG x 3, LIMA-LAD, VG-OM, VG-PDA 01/15/13    5. Iron deficiency anemia, unspecified iron deficiency anemia type   6. Ventral incisional hernia   7. S/P AAA repair     ASSESSMENT  AND PLAN: Derek Wells is an active 83 year old white male who is status post  CABG revascularization surgery done emergently after cardiac catheterization on 01/15/2013 demonstrated severe life threatening coronary anatomy.   He  is in the maintenance phase of cardiac rehabilitation. He  developed atrial flutter in January 2015 and was started on anticoagulation with Xarelto. He is now status post successful DC cardioversion and he is maintaining normal sinus rhythm with first degree AV block.  His blood pressure today is improved on amlodipine 5 mg vision to his valsartan 160 mg and Toprol-XL 25 mg daily.  Last year he was found  to be anemic and had significant iron deficiency at 6% with a very low ferritin at 7.  His Xarelto was discontinued and is now on aspirin 81 mg.  He underwent complete GI evaluation by Dr. Oletta Lamas and has been on iron replacement.  I reviewed his most recent nuclear perfusion study, which remained stable and did not demonstrate scar or ischemia.  Reviewed his most recent laboratory.  He continues to be on atorvastatin 20 mg.  In the past, he did not tolerate 80 mg and also still had mild myalgias on 40 mg dosing.  I suggested the addition of Zetia 10 mg.  I also discussed with him recent outcome data with [facility] as well as plaque regression studies with PCSK9 inhibition. He continues to take metformin for type 2 diabetes mellitus.  He continues to be in cardiac rehabilitation maintenance phase.  Repeat blood work will be obtained in 3 months and I will see him in 4 months for office visit reevaluation.  Time spent: 25 minutes Troy Sine, MD, [facility]  10/03/2016 7:06 PM

## 2016-10-04 ENCOUNTER — Encounter (HOSPITAL_COMMUNITY)
Admission: RE | Admit: 2016-10-04 | Discharge: 2016-10-04 | Disposition: A | Discharge status: Home / Self Care | Payer: Self-pay | Source: Ambulatory Visit | Attending: Cardiovascular Disease | Admitting: Cardiovascular Disease

## 2016-10-07 ENCOUNTER — Telehealth: Payer: Self-pay | Admitting: Cardiovascular Disease

## 2016-10-07 ENCOUNTER — Encounter (HOSPITAL_COMMUNITY)
Admission: RE | Admit: 2016-10-07 | Discharge: 2016-10-07 | Disposition: A | Discharge status: Home / Self Care | Payer: Self-pay | Source: Ambulatory Visit | Attending: Cardiovascular Disease | Admitting: Cardiovascular Disease

## 2016-10-07 NOTE — Telephone Encounter (Signed)
New message   Pt verbalized that he do not want to switch to Zetia

## 2016-10-07 NOTE — Telephone Encounter (Signed)
Spoke with pt states that at his last visit with dr Tresa EndoKelly he asked him if he wanted to increase his atorvastatin 40mg  or if he wanted to start Zetia 10mg . Pt states that since he had success in the past with Zetia he chose the Zetia. He went to fill today and it costs $454.00, this is too expensive, he states that he would like to do the atorvastatin 40mg  instead. I asked pt if he had myalgias on the 40 mg he stated that he did not, just on the 80mg .  Ok to increase atorvastatin 40mg  instead of Zetia 10mg ? Pt informed Dr Tresa Endokelly is not in the office, he states that Burna MortimerWanda knows all about it Please advise

## 2016-10-08 ENCOUNTER — Telehealth: Payer: Self-pay | Admitting: *Deleted

## 2016-10-08 ENCOUNTER — Other Ambulatory Visit: Payer: Self-pay | Admitting: *Deleted

## 2016-10-08 MED ORDER — ATORVASTATIN CALCIUM 40 MG PO TABS: 40.0000 mg | ORAL_TABLET | Freq: Every day | ORAL | 3 refills | Status: DC

## 2016-10-08 NOTE — Telephone Encounter (Signed)
Returned a call to patient. Per Dr Landry DykeKelly's last lab note. Atorvastatin 40 mg sent to his pharmacy per patient's request.

## 2016-10-08 NOTE — Telephone Encounter (Signed)
Patient called to say the zetia is too expensive. Will increase the atorvastatin to 40 mg per Dr Landry DykeKelly's recommendations.

## 2016-10-08 NOTE — Telephone Encounter (Signed)
Patient called to say that the zetia costs too much $$ so he requested to go back to Dr Landry DykeKelly's other recommendation to increase the atorvastatin to 40 mg. New prescription for atorvastatin 40 mg #90 sent to patient's pharmacy.

## 2016-10-09 ENCOUNTER — Encounter (HOSPITAL_COMMUNITY): Payer: Self-pay

## 2016-10-11 ENCOUNTER — Encounter (HOSPITAL_COMMUNITY): Payer: Self-pay

## 2016-10-14 ENCOUNTER — Encounter (HOSPITAL_COMMUNITY): Payer: Self-pay

## 2016-10-14 NOTE — Telephone Encounter (Signed)
Acknowledged.

## 2016-10-16 ENCOUNTER — Encounter (HOSPITAL_COMMUNITY): Payer: Self-pay

## 2016-10-18 ENCOUNTER — Encounter (HOSPITAL_COMMUNITY): Payer: Self-pay

## 2016-10-21 ENCOUNTER — Encounter (HOSPITAL_COMMUNITY): Payer: Self-pay

## 2016-10-22 DIAGNOSIS — J069 Acute upper respiratory infection, unspecified: Secondary | ICD-10-CM | POA: Diagnosis not present

## 2016-10-23 ENCOUNTER — Encounter (HOSPITAL_COMMUNITY): Payer: Self-pay

## 2016-10-25 ENCOUNTER — Encounter (HOSPITAL_COMMUNITY)
Admission: RE | Admit: 2016-10-25 | Discharge: 2016-10-25 | Disposition: A | Discharge status: Home / Self Care | Payer: Self-pay | Source: Ambulatory Visit | Attending: Cardiovascular Disease | Admitting: Cardiovascular Disease

## 2016-10-25 DIAGNOSIS — I2510 Atherosclerotic heart disease of native coronary artery without angina pectoris: Secondary | ICD-10-CM | POA: Insufficient documentation

## 2016-10-28 ENCOUNTER — Encounter (HOSPITAL_COMMUNITY)
Admission: RE | Admit: 2016-10-28 | Discharge: 2016-10-28 | Disposition: A | Discharge status: Home / Self Care | Payer: Self-pay | Source: Ambulatory Visit | Attending: Cardiovascular Disease | Admitting: Cardiovascular Disease

## 2016-10-30 ENCOUNTER — Encounter (HOSPITAL_COMMUNITY): Payer: Self-pay

## 2016-11-01 ENCOUNTER — Encounter (HOSPITAL_COMMUNITY)
Admission: RE | Admit: 2016-11-01 | Discharge: 2016-11-01 | Disposition: A | Discharge status: Home / Self Care | Payer: Self-pay | Source: Ambulatory Visit | Attending: Cardiovascular Disease | Admitting: Cardiovascular Disease

## 2016-11-04 ENCOUNTER — Encounter (HOSPITAL_COMMUNITY)
Admission: RE | Admit: 2016-11-04 | Discharge: 2016-11-04 | Disposition: A | Discharge status: Home / Self Care | Payer: Self-pay | Source: Ambulatory Visit | Attending: Cardiovascular Disease | Admitting: Cardiovascular Disease

## 2016-11-06 ENCOUNTER — Encounter (HOSPITAL_COMMUNITY)
Admission: RE | Admit: 2016-11-06 | Discharge: 2016-11-06 | Disposition: A | Discharge status: Home / Self Care | Payer: Self-pay | Source: Ambulatory Visit | Attending: Cardiovascular Disease | Admitting: Cardiovascular Disease

## 2016-11-08 ENCOUNTER — Encounter (HOSPITAL_COMMUNITY): Payer: Self-pay

## 2016-11-11 ENCOUNTER — Encounter (HOSPITAL_COMMUNITY)
Admission: RE | Admit: 2016-11-11 | Discharge: 2016-11-11 | Disposition: A | Discharge status: Home / Self Care | Payer: Self-pay | Source: Ambulatory Visit | Attending: Cardiovascular Disease | Admitting: Cardiovascular Disease

## 2016-11-13 ENCOUNTER — Encounter (HOSPITAL_COMMUNITY)
Admission: RE | Admit: 2016-11-13 | Discharge: 2016-11-13 | Disposition: A | Discharge status: Home / Self Care | Payer: Self-pay | Source: Ambulatory Visit | Attending: Cardiovascular Disease | Admitting: Cardiovascular Disease

## 2016-11-15 ENCOUNTER — Encounter (HOSPITAL_COMMUNITY): Payer: Self-pay

## 2016-11-18 ENCOUNTER — Encounter (HOSPITAL_COMMUNITY): Payer: Self-pay

## 2016-11-20 ENCOUNTER — Encounter (HOSPITAL_COMMUNITY): Payer: Self-pay

## 2016-11-22 ENCOUNTER — Encounter (HOSPITAL_COMMUNITY): Payer: Self-pay

## 2016-11-22 DIAGNOSIS — I2510 Atherosclerotic heart disease of native coronary artery without angina pectoris: Secondary | ICD-10-CM | POA: Insufficient documentation

## 2016-11-25 ENCOUNTER — Encounter (HOSPITAL_COMMUNITY): Payer: Self-pay

## 2016-11-27 ENCOUNTER — Encounter (HOSPITAL_COMMUNITY): Payer: Self-pay

## 2016-11-29 ENCOUNTER — Encounter (HOSPITAL_COMMUNITY): Payer: Self-pay

## 2016-12-02 ENCOUNTER — Encounter (HOSPITAL_COMMUNITY): Payer: Self-pay

## 2016-12-04 ENCOUNTER — Encounter (HOSPITAL_COMMUNITY): Payer: Self-pay

## 2016-12-04 DIAGNOSIS — Z0001 Encounter for general adult medical examination with abnormal findings: Secondary | ICD-10-CM | POA: Diagnosis not present

## 2016-12-04 DIAGNOSIS — Z1389 Encounter for screening for other disorder: Secondary | ICD-10-CM | POA: Diagnosis not present

## 2016-12-04 DIAGNOSIS — I10 Essential (primary) hypertension: Secondary | ICD-10-CM | POA: Diagnosis not present

## 2016-12-04 DIAGNOSIS — E119 Type 2 diabetes mellitus without complications: Secondary | ICD-10-CM | POA: Diagnosis not present

## 2016-12-06 ENCOUNTER — Encounter (HOSPITAL_COMMUNITY)
Admission: RE | Admit: 2016-12-06 | Discharge: 2016-12-06 | Disposition: A | Discharge status: Home / Self Care | Payer: Self-pay | Source: Ambulatory Visit | Attending: Cardiovascular Disease | Admitting: Cardiovascular Disease

## 2016-12-09 ENCOUNTER — Encounter (HOSPITAL_COMMUNITY)
Admission: RE | Admit: 2016-12-09 | Discharge: 2016-12-09 | Disposition: A | Discharge status: Home / Self Care | Payer: Self-pay | Source: Ambulatory Visit | Attending: Cardiovascular Disease | Admitting: Cardiovascular Disease

## 2016-12-11 ENCOUNTER — Encounter (HOSPITAL_COMMUNITY): Payer: Self-pay

## 2016-12-13 ENCOUNTER — Encounter (HOSPITAL_COMMUNITY): Payer: Self-pay

## 2016-12-16 ENCOUNTER — Encounter (HOSPITAL_COMMUNITY)
Admission: RE | Admit: 2016-12-16 | Discharge: 2016-12-16 | Disposition: A | Discharge status: Home / Self Care | Payer: Self-pay | Source: Ambulatory Visit | Attending: Cardiovascular Disease | Admitting: Cardiovascular Disease

## 2016-12-18 ENCOUNTER — Encounter (HOSPITAL_COMMUNITY): Payer: Self-pay

## 2016-12-20 ENCOUNTER — Encounter (HOSPITAL_COMMUNITY): Payer: Self-pay

## 2016-12-23 ENCOUNTER — Encounter (HOSPITAL_COMMUNITY)
Admission: RE | Admit: 2016-12-23 | Discharge: 2016-12-23 | Disposition: A | Discharge status: Home / Self Care | Payer: Self-pay | Source: Ambulatory Visit | Attending: Cardiovascular Disease | Admitting: Cardiovascular Disease

## 2016-12-23 DIAGNOSIS — I2510 Atherosclerotic heart disease of native coronary artery without angina pectoris: Secondary | ICD-10-CM | POA: Insufficient documentation

## 2016-12-25 ENCOUNTER — Encounter (HOSPITAL_COMMUNITY)
Admission: RE | Admit: 2016-12-25 | Discharge: 2016-12-25 | Disposition: A | Discharge status: Home / Self Care | Payer: Self-pay | Source: Ambulatory Visit | Attending: Cardiovascular Disease | Admitting: Cardiovascular Disease

## 2016-12-27 ENCOUNTER — Encounter (HOSPITAL_COMMUNITY): Payer: Self-pay

## 2016-12-30 ENCOUNTER — Encounter (HOSPITAL_COMMUNITY)
Admission: RE | Admit: 2016-12-30 | Discharge: 2016-12-30 | Disposition: A | Discharge status: Home / Self Care | Payer: Self-pay | Source: Ambulatory Visit | Attending: Cardiovascular Disease | Admitting: Cardiovascular Disease

## 2017-01-01 ENCOUNTER — Encounter (HOSPITAL_COMMUNITY): Payer: Self-pay

## 2017-01-03 ENCOUNTER — Encounter (HOSPITAL_COMMUNITY)
Admission: RE | Admit: 2017-01-03 | Discharge: 2017-01-03 | Disposition: A | Discharge status: Home / Self Care | Payer: Self-pay | Source: Ambulatory Visit | Attending: Cardiovascular Disease | Admitting: Cardiovascular Disease

## 2017-01-06 ENCOUNTER — Encounter (HOSPITAL_COMMUNITY): Payer: Self-pay

## 2017-01-08 ENCOUNTER — Encounter (HOSPITAL_COMMUNITY)
Admission: RE | Admit: 2017-01-08 | Discharge: 2017-01-08 | Disposition: A | Discharge status: Home / Self Care | Payer: Self-pay | Source: Ambulatory Visit | Attending: Cardiovascular Disease | Admitting: Cardiovascular Disease

## 2017-01-10 ENCOUNTER — Encounter (HOSPITAL_COMMUNITY): Payer: Self-pay

## 2017-01-13 ENCOUNTER — Encounter (HOSPITAL_COMMUNITY): Payer: Self-pay

## 2017-01-15 ENCOUNTER — Encounter (HOSPITAL_COMMUNITY)
Admission: RE | Admit: 2017-01-15 | Discharge: 2017-01-15 | Disposition: A | Discharge status: Home / Self Care | Payer: Self-pay | Source: Ambulatory Visit | Attending: Cardiovascular Disease | Admitting: Cardiovascular Disease

## 2017-01-17 ENCOUNTER — Encounter (HOSPITAL_COMMUNITY)
Admission: RE | Admit: 2017-01-17 | Discharge: 2017-01-17 | Disposition: A | Discharge status: Home / Self Care | Payer: Self-pay | Source: Ambulatory Visit | Attending: Cardiovascular Disease | Admitting: Cardiovascular Disease

## 2017-01-20 ENCOUNTER — Encounter (HOSPITAL_COMMUNITY)
Admission: RE | Admit: 2017-01-20 | Discharge: 2017-01-20 | Disposition: A | Discharge status: Home / Self Care | Payer: Self-pay | Source: Ambulatory Visit | Attending: Cardiovascular Disease | Admitting: Cardiovascular Disease

## 2017-01-24 ENCOUNTER — Encounter (HOSPITAL_COMMUNITY)
Admission: RE | Admit: 2017-01-24 | Discharge: 2017-01-24 | Disposition: A | Discharge status: Home / Self Care | Payer: Self-pay | Source: Ambulatory Visit | Attending: Cardiovascular Disease | Admitting: Cardiovascular Disease

## 2017-01-24 DIAGNOSIS — I2510 Atherosclerotic heart disease of native coronary artery without angina pectoris: Secondary | ICD-10-CM | POA: Insufficient documentation

## 2017-01-27 ENCOUNTER — Encounter (HOSPITAL_COMMUNITY)
Admission: RE | Admit: 2017-01-27 | Discharge: 2017-01-27 | Disposition: A | Discharge status: Home / Self Care | Payer: Self-pay | Source: Ambulatory Visit | Attending: Cardiovascular Disease | Admitting: Cardiovascular Disease

## 2017-01-29 ENCOUNTER — Encounter (HOSPITAL_COMMUNITY): Payer: Self-pay

## 2017-01-30 ENCOUNTER — Telehealth: Payer: Self-pay | Admitting: *Deleted

## 2017-01-30 NOTE — Telephone Encounter (Signed)
Returned cardiac rehab maintenance order to [facility]. 

## 2017-01-31 ENCOUNTER — Encounter (HOSPITAL_COMMUNITY)
Admission: RE | Admit: 2017-01-31 | Discharge: 2017-01-31 | Disposition: A | Discharge status: Home / Self Care | Payer: Self-pay | Source: Ambulatory Visit | Attending: Cardiovascular Disease | Admitting: Cardiovascular Disease

## 2017-02-03 ENCOUNTER — Encounter (HOSPITAL_COMMUNITY): Payer: Self-pay

## 2017-02-05 ENCOUNTER — Encounter (HOSPITAL_COMMUNITY)
Admission: RE | Admit: 2017-02-05 | Discharge: 2017-02-05 | Disposition: A | Discharge status: Home / Self Care | Payer: Self-pay | Source: Ambulatory Visit | Attending: Cardiovascular Disease | Admitting: Cardiovascular Disease

## 2017-02-07 ENCOUNTER — Encounter (HOSPITAL_COMMUNITY): Payer: Self-pay

## 2017-02-10 ENCOUNTER — Encounter (HOSPITAL_COMMUNITY): Payer: Self-pay

## 2017-02-12 ENCOUNTER — Encounter (HOSPITAL_COMMUNITY): Payer: Self-pay

## 2017-02-14 ENCOUNTER — Encounter (HOSPITAL_COMMUNITY): Payer: Self-pay

## 2017-02-19 ENCOUNTER — Encounter (HOSPITAL_COMMUNITY)
Admission: RE | Admit: 2017-02-19 | Discharge: 2017-02-19 | Disposition: A | Discharge status: Home / Self Care | Payer: Self-pay | Source: Ambulatory Visit | Attending: Cardiovascular Disease | Admitting: Cardiovascular Disease

## 2017-02-21 ENCOUNTER — Encounter (HOSPITAL_COMMUNITY)
Admission: RE | Admit: 2017-02-21 | Discharge: 2017-02-21 | Disposition: A | Discharge status: Home / Self Care | Payer: Medicare Other | Source: Ambulatory Visit | Attending: Cardiovascular Disease | Admitting: Cardiovascular Disease

## 2017-02-21 DIAGNOSIS — I2510 Atherosclerotic heart disease of native coronary artery without angina pectoris: Secondary | ICD-10-CM | POA: Diagnosis present

## 2017-02-24 ENCOUNTER — Encounter (HOSPITAL_COMMUNITY): Payer: Medicare Other

## 2017-02-26 ENCOUNTER — Encounter (HOSPITAL_COMMUNITY): Payer: Medicare Other

## 2017-02-28 ENCOUNTER — Ambulatory Visit: Payer: Medicare Other | Admitting: Physician Assistant

## 2017-02-28 ENCOUNTER — Encounter (HOSPITAL_COMMUNITY): Payer: Medicare Other

## 2017-03-03 ENCOUNTER — Encounter: Payer: Self-pay | Admitting: Physician Assistant

## 2017-03-03 ENCOUNTER — Ambulatory Visit (INDEPENDENT_AMBULATORY_CARE_PROVIDER_SITE_OTHER): Payer: Medicare Other | Admitting: Physician Assistant

## 2017-03-03 ENCOUNTER — Encounter (HOSPITAL_COMMUNITY): Payer: Medicare Other

## 2017-03-03 VITALS — BP 128/52 | HR 64 | Ht 73.0 in | Wt 247.0 lb

## 2017-03-03 DIAGNOSIS — I10 Essential (primary) hypertension: Secondary | ICD-10-CM

## 2017-03-03 DIAGNOSIS — E785 Hyperlipidemia, unspecified: Secondary | ICD-10-CM | POA: Diagnosis not present

## 2017-03-03 DIAGNOSIS — Z79899 Other long term (current) drug therapy: Secondary | ICD-10-CM

## 2017-03-03 DIAGNOSIS — I4892 Unspecified atrial flutter: Secondary | ICD-10-CM

## 2017-03-03 DIAGNOSIS — Z8679 Personal history of other diseases of the circulatory system: Secondary | ICD-10-CM

## 2017-03-03 DIAGNOSIS — I25810 Atherosclerosis of coronary artery bypass graft(s) without angina pectoris: Secondary | ICD-10-CM

## 2017-03-03 NOTE — Progress Notes (Signed)
Cardiology Office Note    Date:  03/04/2017   ID:  Derek Wells, DOB 1933-02-02, MRN [id number]  PCP:  Marden Noble, MD  Cardiologist:  Dr. Tresa Endo   Chief Complaint  Patient presents with  . Follow-up    seen for Dr. Tresa Endo    History of Present Illness:  Derek Wells is a 83 y.o. male with PMH of HTN, HLD, paroxysmal atrial flutter, AAA s/p repair in 12/2008 and CAD s/p CABG. He had prior cardiac catheterization on 01/15/2013 which showed 60-70% ostial left main lesion, 90-95% distal left main lesion, 95-99% ostial LAD lesion, and mild to moderate stenosis in the RCA. He underwent evaluation by Dr. Laneta Simmers and subsequent CABG 3 with LIMA to LAD, SVG to OM, and SVG to PDA. In January 2015, he was diagnosed with atrial flutter during cardiac rehabilitation. He was started on Xarelto with time and underwent successful cardioversion on 11/12/2013. Due to progressive PR prolongation on the EKG, his Lopressor was decreased to 25 mg daily, amlodipine 5 mg was added to his blood pressure and chest tightness. He had a nuclear stress test obtained on 07/23/2016, this showed EF 62%, no ischemia, rare PVCs, overall considered low risk study. He was last seen by Dr. Tresa Endo on 10/02/2016, Zetia was added at the time. Unfortunately, Zetia was going to cost him $454, therefore his Lipitor was increased to 40 mg daily instead.  He presents today for follow-up. He continued to have chest pressure only with the most strenuous activity. However regular activity does not elicit any chest pain. He continued to use cardiac rehabilitation without any exertional type of symptom. He also plays golf 3 times a week without any issue as well. Only he has to walk up a steep incline when he have some chest tightness. This has not changed compared to the previous office visit. I recommended him to continue observation for this type of stable angina for now. As far as Lipitor, he is on 40 mg daily. He has not had any issue associated  with this I recommended a fasting lipid panel and LFTs. Otherwise he has been doing very well.    Past Medical History:  Diagnosis Date  . CAD (coronary artery disease), severe needing CABG 01/19/2013  . Chest pain    2D ECHO, 06/16/2012 - EF 50-55%, borderline low left ventricular systolic function  . Coronary artery disease   . Dyspnea on exertion    LEXISCAN, 06/17/2012 - fixed inferior bowel artifact and basal septal defect, probably related to IVCD/incomplete LBBB, no reversible ischemia, post-stress EF 66%  . History of AAA (abdominal aortic aneurysm) repair    ABDOMINAL AORTIC DOPPLER, 06/16/2013 - normal  . HTN (hypertension) 01/19/2013  . Hyperlipidemia LDL goal < 70 01/19/2013  . Hypertension   . S/P CABG x 3 01/19/2013    Past Surgical History:  Procedure Laterality Date  . CARDIAC CATHETERIZATION  01/15/2013   Severe coronary obstructive disease with 60-70% ostial left main and 90-85% distal LAD stenosis and mild-moderate stenoses in RCA, recommended for CABG  . CARDIOVERSION N/A 11/12/2013   Procedure: CARDIOVERSION;  Surgeon: Lennette Bihari, MD;  Location[facility] END[facility]COPY;  Service: Cardiovascular;  Laterality: N/A;  11:16 Lido 60mg , Propofol 120mg ,IV 11:19 synch 100 joules conversion conversion to NSR....  . CORONARY ARTERY BYPASS GRAFT N/A 01/15/2013   Procedure: Coronary Artery Bypass Graft times three using Right Greater Saphenous Vein Graft harvested endoscopically and Left Internal Mammary Artery and;  Surgeon: Alleen Borne,  MD;  Location: [facility] OR;  Service: Open Heart Surgery;  Laterality: N/A;  . INTRAOPERATIVE TRANSESOPHAGEAL ECHOCARDIOGRAM  01/15/2013   Procedure: INTRAOPERATIVE TRANSESOPHAGEAL ECHOCARDIOGRAM;  Surgeon: Alleen BorneBryan K Bartle, MD;  Location: [facility] OR;  Service: Open Heart Surgery;;  . LEFT HEART CATHETERIZATION WITH CORONARY ANGIOGRAM N/A 01/15/2013   Procedure: LEFT HEART CATHETERIZATION WITH CORONARY ANGIOGRAM;  Surgeon: Lennette Biharihomas A Kelly, MD;  [facility];   Service: Cardiovascular;  Laterality: N/A;  . VASCULAR SURGERY      Current Medications: Outpatient Medications Prior to Visit  Medication Sig Dispense Refill  . amLODipine (NORVASC) 5 MG tablet Take 1 tablet (5 mg total) by mouth daily. 30 tablet 11  . aspirin 81 MG tablet Take 81 mg by mouth daily.    Marland Kitchen. atorvastatin (LIPITOR) 40 MG tablet Take 1 tablet (40 mg total) by mouth daily. 90 tablet 3  . fish oil-omega-3 fatty acids 1000 MG capsule Take 1 g by mouth daily.    . iron polysaccharides (NIFEREX) 150 MG capsule Take 1 capsule (150 mg total) by mouth 2 (two) times daily. 60 capsule 3  . metFORMIN (GLUCOPHAGE) 500 MG tablet Take 500 mg by mouth daily.    . metoprolol succinate (TOPROL-XL) 25 MG 24 hr tablet Take 1 tablet (25 mg total) by mouth daily. 30 tablet 10  . Multiple Vitamin (MULTIVITAMIN WITH MINERALS) TABS Take 1 tablet by mouth daily.    . valsartan (DIOVAN) 160 MG tablet take 1 tablet by mouth once daily 90 tablet 2   No facility-administered medications prior to visit.      Allergies:   Penicillins   Social History   Social History  . Marital status: Married    Spouse name: N/A  . Number of children: N/A  . Years of education: N/A   Social History Main Topics  . Smoking status: Former Smoker    Types: Cigars    Quit date: 01/16/2013  . Smokeless tobacco: Never Used  . Alcohol use 8.4 oz/week    14 Standard drinks or equivalent per week  . Drug use: Unknown  . Sexual activity: Not Asked   Other Topics Concern  . None   Social History Narrative  . None     Family History:  The patient's family history includes Heart disease in his brother, father, and mother; Hyperlipidemia in his mother; Hypertension in his father.   ROS:   Please see the history of present illness.    ROS All other systems reviewed and are negative.   PHYSICAL EXAM:   VS:  BP (!) 128/52   Pulse 64   Ht 6\' 1"  (1.854 m)   Wt 247 lb (112 kg)   BMI 32.59 kg/m    GEN: Well  nourished, well developed, in no acute distress  HEENT: normal  Neck: no JVD, carotid bruits, or masses Cardiac: RRR; no murmurs, rubs, or gallops,no edema  Respiratory:  clear to auscultation bilaterally, normal work of breathing GI: soft, nontender, nondistended, + BS MS: no deformity or atrophy  Skin: warm and dry, no rash Neuro:  Alert and Oriented x 3, Strength and sensation are intact Psych: euthymic mood, full affect  Wt Readings from Last 3 Encounters:  03/03/17 247 lb (112 kg)  10/02/16 252 lb 6.4 oz (114.5 kg)  07/23/16 249 lb (112.9 kg)      Studies/Labs Reviewed:   EKG:  EKG is not ordered today.    Recent Labs: 07/19/2016: ALT 17; BUN 19; Creat 0.90; Hemoglobin  14.7; Platelets 266; Potassium 4.8; Sodium 140; TSH 2.35   Lipid Panel    Component Value Date/Time   CHOL 164 07/19/2016 0949   CHOL 111 03/01/2015 1033   TRIG 122 07/19/2016 0949   TRIG 90 03/01/2015 1033   HDL 40 07/19/2016 0949   HDL 41 03/01/2015 1033   CHOLHDL 4.1 07/19/2016 0949   VLDL 24 07/19/2016 0949   LDLCALC 100 07/19/2016 0949   LDLCALC 52 03/01/2015 1033    Additional studies/ records that were reviewed today include:   Echo 03/18/2014 LV EF: 60% -  65%  Study Conclusions  - Left ventricle: The cavity size was normal. Wall thickness was normal. Systolic function was normal. The estimated ejection fraction was in the range of 60% to 65%. Wall motion was normal; there were no regional wall motion abnormalities. There was an increased relative contribution of atrial contraction to ventricular filling. - Left atrium: The atrium was mildly dilated. - Right atrium: The atrium was mildly dilated. - Pericardium, extracardiac: A trivial pericardial effusion was identified.   Myoview 07/23/2016  The left ventricular ejection fraction is normal (55-65%).  Nuclear stress EF: 62%. No wall motion abnormalities  There was no ST segment deviation noted during stress. Rare  PVC  This is a low risk study. No perfusion defects, no ischemia   ASSESSMENT:    1. Coronary artery disease involving coronary bypass graft of native heart without angina pectoris   2. Hyperlipidemia with target LDL less than 70   3. Encounter for long-term (current) use of medications   4. Essential hypertension   5. Paroxysmal atrial flutter (HCC)   6. History of abdominal aortic aneurysm (AAA)      PLAN:  In order of problems listed above:  1. CAD s/p CABG: Has very stable angina at this time, symptom unchanged compared to the previous office visit. No obvious angina with regular activity, only has mild chest tightness associated with the most strenuous activity. Continue on current medication  2. Hyperlipidemia: Has myalgia with high-dose statin, we did discuss Repatha, he wished to think about this for now. He has not had any issue after his Lipitor was increased to 40 mg daily. He was unable to afford Zetia, apparently this cost $450. We'll need fasting lipid panel and LFT.  3. Hypertension: Blood pressure stable 128/52 today.  4. Paroxysmal atrial flutter: No obvious recurrence  5. History of AAA s/p repair    Medication Adjustments/Labs and Tests Ordered: Current medicines are reviewed at length with the patient today.  Concerns regarding medicines are outlined above.  Medication changes, Labs and Tests ordered today are listed in the Patient Instructions below. Patient Instructions  Medication Instructions:   No change  Labwork:   Fasting cholesterol and liver function tests this week  Testing/Procedures:  none  Follow-Up:  With Dr. Tresa Endo in 5 months.   If you need a refill on your cardiac medications before your next appointment, please call your pharmacy.      Ramond Dial, [place]  03/04/2017 1:04 PM    [facility] [address] [place], [place], [place]  [address] Phone: [phone]; Fax: [phone]

## 2017-03-03 NOTE — Patient Instructions (Signed)
Medication Instructions:   No change  Labwork:   Fasting cholesterol and liver function tests this week  Testing/Procedures:  none  Follow-Up:  With Dr. Tresa EndoKelly in 5 months.   If you need a refill on your cardiac medications before your next appointment, please call your pharmacy.

## 2017-03-04 ENCOUNTER — Encounter: Payer: Self-pay | Admitting: Physician Assistant

## 2017-03-05 ENCOUNTER — Encounter (HOSPITAL_COMMUNITY)
Admission: RE | Admit: 2017-03-05 | Discharge: 2017-03-05 | Disposition: A | Discharge status: Home / Self Care | Payer: Medicare Other | Source: Ambulatory Visit | Attending: Cardiovascular Disease | Admitting: Cardiovascular Disease

## 2017-03-07 ENCOUNTER — Encounter (HOSPITAL_COMMUNITY): Payer: Medicare Other

## 2017-03-07 DIAGNOSIS — Z79899 Other long term (current) drug therapy: Secondary | ICD-10-CM | POA: Diagnosis not present

## 2017-03-07 DIAGNOSIS — E785 Hyperlipidemia, unspecified: Secondary | ICD-10-CM | POA: Diagnosis not present

## 2017-03-08 LAB — HEPATIC FUNCTION PANEL
ALK PHOS: 64 IU/L (ref 39–117)
ALT: 22 IU/L (ref 0–44)
AST: 20 IU/L (ref 0–40)
Albumin: 4.3 g/dL (ref 3.5–4.7)
Bilirubin Total: 0.9 mg/dL (ref 0.0–1.2)
Bilirubin, Direct: 0.22 mg/dL (ref 0.00–0.40)
TOTAL PROTEIN: 6.7 g/dL (ref 6.0–8.5)

## 2017-03-08 LAB — LIPID PANEL
CHOL/HDL RATIO: 3.5 ratio (ref 0.0–5.0)
Cholesterol, Total: 148 mg/dL (ref 100–199)
HDL: 42 mg/dL (ref >39)
LDL CALC: 86 mg/dL (ref 0–99)
Triglycerides: 101 mg/dL (ref 0–149)
VLDL CHOLESTEROL CAL: 20 mg/dL (ref 5–40)

## 2017-03-08 NOTE — Progress Notes (Signed)
Liver function and cholesterol are good except the LDL (bad cholesterol) is only slightly elevated. If he is taking lipitor, then would be ideal to increase to 80mg  daily.

## 2017-03-10 ENCOUNTER — Encounter (HOSPITAL_COMMUNITY)
Admission: RE | Admit: 2017-03-10 | Discharge: 2017-03-10 | Disposition: A | Discharge status: Home / Self Care | Payer: Medicare Other | Source: Ambulatory Visit | Attending: Cardiovascular Disease | Admitting: Cardiovascular Disease

## 2017-03-12 ENCOUNTER — Encounter (HOSPITAL_COMMUNITY)
Admission: RE | Admit: 2017-03-12 | Discharge: 2017-03-12 | Disposition: A | Discharge status: Home / Self Care | Payer: Medicare Other | Source: Ambulatory Visit | Attending: Cardiovascular Disease | Admitting: Cardiovascular Disease

## 2017-03-13 NOTE — Progress Notes (Signed)
Then I would continue the current dose is fine

## 2017-03-14 ENCOUNTER — Encounter (HOSPITAL_COMMUNITY)
Admission: RE | Admit: 2017-03-14 | Discharge: 2017-03-14 | Disposition: A | Discharge status: Home / Self Care | Payer: Medicare Other | Source: Ambulatory Visit | Attending: Cardiovascular Disease | Admitting: Cardiovascular Disease

## 2017-03-17 ENCOUNTER — Encounter (HOSPITAL_COMMUNITY)
Admission: RE | Admit: 2017-03-17 | Discharge: 2017-03-17 | Disposition: A | Discharge status: Home / Self Care | Payer: Medicare Other | Source: Ambulatory Visit | Attending: Cardiovascular Disease | Admitting: Cardiovascular Disease

## 2017-03-19 ENCOUNTER — Encounter (HOSPITAL_COMMUNITY): Payer: Medicare Other

## 2017-03-21 ENCOUNTER — Encounter (HOSPITAL_COMMUNITY): Payer: Medicare Other

## 2017-03-24 ENCOUNTER — Encounter (HOSPITAL_COMMUNITY): Payer: Medicare Other

## 2017-03-24 DIAGNOSIS — I2510 Atherosclerotic heart disease of native coronary artery without angina pectoris: Secondary | ICD-10-CM | POA: Diagnosis not present

## 2017-03-28 ENCOUNTER — Encounter (HOSPITAL_COMMUNITY)
Admission: RE | Admit: 2017-03-28 | Discharge: 2017-03-28 | Disposition: A | Discharge status: Home / Self Care | Payer: Medicare Other | Source: Ambulatory Visit | Attending: Cardiovascular Disease | Admitting: Cardiovascular Disease

## 2017-03-31 ENCOUNTER — Other Ambulatory Visit: Payer: Self-pay | Admitting: Cardiovascular Disease

## 2017-03-31 ENCOUNTER — Encounter (HOSPITAL_COMMUNITY)
Admission: RE | Admit: 2017-03-31 | Discharge: 2017-03-31 | Disposition: A | Discharge status: Home / Self Care | Payer: Medicare Other | Source: Ambulatory Visit | Attending: Cardiovascular Disease | Admitting: Cardiovascular Disease

## 2017-04-02 ENCOUNTER — Encounter (HOSPITAL_COMMUNITY): Payer: Medicare Other

## 2017-04-04 ENCOUNTER — Encounter (HOSPITAL_COMMUNITY): Payer: Medicare Other

## 2017-04-07 ENCOUNTER — Encounter (HOSPITAL_COMMUNITY)
Admission: RE | Admit: 2017-04-07 | Discharge: 2017-04-07 | Disposition: A | Discharge status: Home / Self Care | Payer: Medicare Other | Source: Ambulatory Visit | Attending: Cardiovascular Disease | Admitting: Cardiovascular Disease

## 2017-04-09 ENCOUNTER — Encounter (HOSPITAL_COMMUNITY)
Admission: RE | Admit: 2017-04-09 | Discharge: 2017-04-09 | Disposition: A | Discharge status: Home / Self Care | Payer: Medicare Other | Source: Ambulatory Visit | Attending: Cardiovascular Disease | Admitting: Cardiovascular Disease

## 2017-04-11 ENCOUNTER — Encounter (HOSPITAL_COMMUNITY): Payer: Medicare Other

## 2017-04-14 ENCOUNTER — Encounter (HOSPITAL_COMMUNITY)
Admission: RE | Admit: 2017-04-14 | Discharge: 2017-04-14 | Disposition: A | Discharge status: Home / Self Care | Payer: Medicare Other | Source: Ambulatory Visit | Attending: Cardiovascular Disease | Admitting: Cardiovascular Disease

## 2017-04-14 ENCOUNTER — Telehealth: Payer: Self-pay | Admitting: Cardiovascular Disease

## 2017-04-14 NOTE — Telephone Encounter (Signed)
NEw Message  Derek Wells called from [facility]. Pt came in with irregular heartbeat, he was also in Afib. She states pt c/o chest pain that lead to his jaw. She would like to speak with RN. Please call back to discuss

## 2017-04-14 NOTE — Telephone Encounter (Signed)
Returned call - there is no Alicia at cardiac rehab, did speak to Allied Waste IndustriesCarlette RN. Patient has a fib hx and was in rate controlled A Fib today. He was set up to see Lupita LeashDonna in A Fib clinic tomorrow. No further needs identified, Carlette expressed thanks for follow up on this.

## 2017-04-14 NOTE — Progress Notes (Signed)
Pt in today for cardiac rehab maintenance non telemetry.  Pt noted that when he checked his heart rate it fluctuated between the 80- 90's.  Pt placed on monitor.  Showed afib with controlled rate 80's.  Pt with history of afib and cardioversion in 2015. bp 127/68, o2 sat 97%.  Pr with no complaints.  Called afiib clinic and spoke to Thayeramanda.  Pt scheduled to be seen on tomorrow at 1:30. Pt given directions and parking code.  Pt verbalized understanding and will alert his physician should any symptoms of cp or sob. Alanson Alyarlette Carlton RN, BSN Cardiac and Emergency planning/management officerulmonary Rehab Nurse Navigator

## 2017-04-15 ENCOUNTER — Encounter (HOSPITAL_COMMUNITY): Payer: Self-pay | Admitting: Nurse Practitioner

## 2017-04-15 ENCOUNTER — Ambulatory Visit (HOSPITAL_COMMUNITY)
Admission: RE | Admit: 2017-04-15 | Discharge: 2017-04-15 | Disposition: A | Discharge status: Home / Self Care | Payer: Medicare Other | Source: Ambulatory Visit | Attending: Nurse Practitioner | Admitting: Nurse Practitioner

## 2017-04-15 VITALS — BP 122/58 | HR 69 | Ht 73.0 in | Wt 244.0 lb

## 2017-04-15 DIAGNOSIS — I2510 Atherosclerotic heart disease of native coronary artery without angina pectoris: Secondary | ICD-10-CM | POA: Insufficient documentation

## 2017-04-15 DIAGNOSIS — I4891 Unspecified atrial fibrillation: Secondary | ICD-10-CM | POA: Insufficient documentation

## 2017-04-15 DIAGNOSIS — I10 Essential (primary) hypertension: Secondary | ICD-10-CM | POA: Diagnosis not present

## 2017-04-15 DIAGNOSIS — Z951 Presence of aortocoronary bypass graft: Secondary | ICD-10-CM | POA: Diagnosis not present

## 2017-04-15 DIAGNOSIS — Z87891 Personal history of nicotine dependence: Secondary | ICD-10-CM | POA: Insufficient documentation

## 2017-04-15 DIAGNOSIS — Z7984 Long term (current) use of oral hypoglycemic drugs: Secondary | ICD-10-CM | POA: Diagnosis not present

## 2017-04-15 DIAGNOSIS — E785 Hyperlipidemia, unspecified: Secondary | ICD-10-CM | POA: Insufficient documentation

## 2017-04-15 DIAGNOSIS — Z8249 Family history of ischemic heart disease and other diseases of the circulatory system: Secondary | ICD-10-CM | POA: Diagnosis not present

## 2017-04-15 DIAGNOSIS — I4892 Unspecified atrial flutter: Secondary | ICD-10-CM | POA: Diagnosis not present

## 2017-04-15 DIAGNOSIS — Z79899 Other long term (current) drug therapy: Secondary | ICD-10-CM | POA: Insufficient documentation

## 2017-04-15 DIAGNOSIS — Z880 Allergy status to penicillin: Secondary | ICD-10-CM | POA: Diagnosis not present

## 2017-04-15 LAB — CBC
HCT: 45.6 % (ref 39.0–52.0)
Hemoglobin: 15.8 g/dL (ref 13.0–17.0)
MCH: 31.3 pg (ref 26.0–34.0)
MCHC: 34.6 g/dL (ref 30.0–36.0)
MCV: 90.3 fL (ref 78.0–100.0)
PLATELETS: 241 10*3/uL (ref 150–400)
RBC: 5.05 MIL/uL (ref 4.22–5.81)
RDW: 13.7 % (ref 11.5–15.5)
WBC: 9.6 10*3/uL (ref 4.0–10.5)

## 2017-04-15 LAB — BASIC METABOLIC PANEL
Anion gap: 8 (ref 5–15)
BUN: 21 mg/dL — AB (ref 6–20)
CHLORIDE: 104 mmol/L (ref 101–111)
CO2: 26 mmol/L (ref 22–32)
CREATININE: 1.1 mg/dL (ref 0.61–1.24)
Calcium: 9.8 mg/dL (ref 8.9–10.3)
GFR calc Af Amer: >60 mL/min (ref >60)
GLUCOSE: 137 mg/dL — AB (ref 65–99)
POTASSIUM: 5.2 mmol/L — AB (ref 3.5–5.1)
SODIUM: 138 mmol/L (ref 135–145)

## 2017-04-15 LAB — TSH: TSH: 2.81 u[IU]/mL (ref 0.350–4.500)

## 2017-04-15 MED ORDER — APIXABAN 5 MG PO TABS: 5.0000 mg | ORAL_TABLET | Freq: Two times a day (BID) | ORAL | 0 refills | Status: DC

## 2017-04-15 MED ORDER — APIXABAN 5 MG PO TABS: 5.0000 mg | ORAL_TABLET | Freq: Two times a day (BID) | ORAL | 3 refills | Status: DC

## 2017-04-15 NOTE — Patient Instructions (Signed)
Your physician has recommended you make the following change in your medication:  1)STOP Aspirin  2)START Eliquis 5mg  twice a day   Scheduler will be in touch with you for follow up appointment with Dr. Tresa EndoKelly

## 2017-04-16 ENCOUNTER — Encounter (HOSPITAL_COMMUNITY)
Admission: RE | Admit: 2017-04-16 | Discharge: 2017-04-16 | Disposition: A | Discharge status: Home / Self Care | Payer: Medicare Other | Source: Ambulatory Visit | Attending: Cardiovascular Disease | Admitting: Cardiovascular Disease

## 2017-04-16 NOTE — Progress Notes (Signed)
Primary Care Physician: Derek NobleGates, Robert, MD [facility]   Derek Wells is a 83 y.o. male with a h/o CAD, s/p bypass in 2014,HTN, remote afib that is in the afib clinic for f/u of afib found in cardiac rehab yesterday. He remains in rate controlled afib today but is asymptomatic. He gives a h/o of a drop in h/h while on anticoagulation for afib in 2016 and anticoagulation was stopped. He had an upper endoscopy and colonoscopy without any source of bleeding. He reports that H/H stabilized with addition of iron. He  has a chadsvasc score of at least 4.  Pt believes that onset of afib may have been due to stress from a family situation and having family members in last week and drinking heavier amounts of alcohol.  Today, he denies symptoms of palpitations, chest pain, shortness of breath, orthopnea, PND, lower extremity edema, dizziness, presyncope, syncope, or neurologic sequela. The patient is tolerating medications without difficulties and is otherwise without complaint today.   Past Medical History:  Diagnosis Date  . CAD (coronary artery disease), severe needing CABG 01/19/2013  . Chest pain    2D ECHO, 06/16/2012 - EF 50-55%, borderline low left ventricular systolic function  . Coronary artery disease   . Dyspnea on exertion    LEXISCAN, 06/17/2012 - fixed inferior bowel artifact and basal septal defect, probably related to IVCD/incomplete LBBB, no reversible ischemia, post-stress EF 66%  . History of AAA (abdominal aortic aneurysm) repair    ABDOMINAL AORTIC DOPPLER, 06/16/2013 - normal  . HTN (hypertension) 01/19/2013  . Hyperlipidemia LDL goal < 70 01/19/2013  . Hypertension   . S/P CABG x 3 01/19/2013   Past Surgical History:  Procedure Laterality Date  . CARDIAC CATHETERIZATION  01/15/2013   Severe coronary obstructive disease with 60-70% ostial left main and 90-85% distal LAD stenosis and mild-moderate stenoses in RCA, recommended for CABG  . CARDIOVERSION  N/A 11/12/2013   Procedure: CARDIOVERSION;  Surgeon: Derek Biharihomas A Kelly, MD;  [facility];  Service: Cardiovascular;  Laterality: N/A;  11:16 Lido 60mg , Propofol 120mg ,IV 11:19 synch 100 joules conversion conversion to NSR....  . CORONARY ARTERY BYPASS GRAFT N/A 01/15/2013   Procedure: Coronary Artery Bypass Graft times three using Right Greater Saphenous Vein Graft harvested endoscopically and Left Internal Mammary Artery and;  Surgeon: Derek BorneBryan K Bartle, MD;  Location: [facility] OR;  Service: Open Heart Surgery;  Laterality: N/A;  . INTRAOPERATIVE TRANSESOPHAGEAL ECHOCARDIOGRAM  01/15/2013   Procedure: INTRAOPERATIVE TRANSESOPHAGEAL ECHOCARDIOGRAM;  Surgeon: Derek BorneBryan K Bartle, MD;  Location: [facility] OR;  Service: Open Heart Surgery;;  . LEFT HEART CATHETERIZATION WITH CORONARY ANGIOGRAM N/A 01/15/2013   Procedure: LEFT HEART CATHETERIZATION WITH CORONARY ANGIOGRAM;  Surgeon: Derek Biharihomas A Kelly, MD;  [facility];  Service: Cardiovascular;  Laterality: N/A;  . VASCULAR SURGERY      Current Outpatient Prescriptions  Medication Sig Dispense Refill  . amLODipine (NORVASC) 5 MG tablet Take 1 tablet (5 mg total) by mouth daily. 30 tablet 11  . atorvastatin (LIPITOR) 40 MG tablet Take 1 tablet (40 mg total) by mouth daily. 90 tablet 3  . fish oil-omega-3 fatty acids 1000 MG capsule Take 1 g by mouth daily.    . iron polysaccharides (NIFEREX) 150 MG capsule Take 1 capsule (150 mg total) by mouth 2 (two) times daily. 60 capsule 3  . metFORMIN (GLUCOPHAGE) 500 MG tablet Take 500 mg by mouth daily.    . metoprolol succinate (TOPROL-XL) 25 MG 24 hr  tablet Take 1 tablet (25 mg total) by mouth daily. 30 tablet 10  . Multiple Vitamin (MULTIVITAMIN WITH MINERALS) TABS Take 1 tablet by mouth daily.    . valsartan (DIOVAN) 160 MG tablet take 1 tablet by mouth once daily 90 tablet 1  . apixaban (ELIQUIS) 5 MG TABS tablet Take 1 tablet (5 mg total) by mouth 2 (two) times daily. 60 tablet 3   No current  facility-administered medications for this encounter.     Allergies  Allergen Reactions  . Penicillins Rash    Social History   Social History  . Marital status: Married    Spouse name: N/A  . Number of children: N/A  . Years of education: N/A   Occupational History  . Not on file.   Social History Main Topics  . Smoking status: Former Smoker    Types: Cigars    Quit date: 01/16/2013  . Smokeless tobacco: Never Used  . Alcohol use 8.4 oz/week    14 Standard drinks or equivalent per week  . Drug use: Unknown  . Sexual activity: Not on file   Other Topics Concern  . Not on file   Social History Narrative  . No narrative on file    Family History  Problem Relation Age of Onset  . Hyperlipidemia Mother   . Heart disease Mother   . Heart disease Father   . Hypertension Father   . Heart disease Brother     ROS- All systems are reviewed and negative except as per the HPI above  Physical Exam: Vitals:   04/15/17 1333  BP: (!) 122/58  Pulse: 69  Weight: 244 lb (110.7 kg)  Height: 6\' 1"  (1.854 m)   Wt Readings from Last 3 Encounters:  04/15/17 244 lb (110.7 kg)  03/03/17 247 lb (112 kg)  10/02/16 252 lb 6.4 oz (114.5 kg)    Labs: Lab Results  Component Value Date   NA 138 04/15/2017   K 5.2 (H) 04/15/2017   CL 104 04/15/2017   CO2 26 04/15/2017   GLUCOSE 137 (H) 04/15/2017   BUN 21 (H) 04/15/2017   CREATININE 1.10 04/15/2017   CALCIUM 9.8 04/15/2017   MG 2.0 10/12/2013   Lab Results  Component Value Date   INR 1.89 (H) 11/08/2013   Lab Results  Component Value Date   CHOL 148 03/07/2017   HDL 42 03/07/2017   LDLCALC 86 03/07/2017   TRIG 101 03/07/2017     GEN- The patient is well appearing, alert and oriented x 3 today.   Head- normocephalic, atraumatic Eyes-  Sclera clear, conjunctiva pink Ears- hearing intact Oropharynx- clear Neck- supple, no JVP Lymph- no cervical lymphadenopathy Lungs- Clear to ausculation bilaterally, normal  work of breathing Heart- irregular rate and rhythm, no murmurs, rubs or gallops, PMI not laterally displaced GI- soft, NT, ND, + BS Extremities- no clubbing, cyanosis, or edema MS- no significant deformity or atrophy Skin- no rash or lesion Psych- euthymic mood, full affect Neuro- strength and sensation are intact  EKG-rhythm strips form cardiac rehab reviewed Afib with HR of 69 bpm, RAD, NSIV block, qrs int 126 ms, qtc 420 ms Epic records reviewed     Assessment and Plan: 1. Asymptomatic afib Currently rate controlled so will not so will not change dose of BB He does have a chadsvasc score of 4 Discussed need for anticoagulation and drop in H/H in past without any bleeding source identified CBC obtained today is normal Will start eliquis 5 mg  bid Bleeding precautions discussed  Stop asa, no NSAIDS Advised decrease alcohol to no more than 2 drinks a week  Pt currently is not interested in possible cardioversion, if persists, after fully anticoagulated, as he says that he is not aware of the afib He can return to rehab Will see back in one week for rhythm review and CBC starting back anticoagulation Will request for him to see Dr. Tresa EndoKelly within one month  Lupita LeashDonna C. Matthew Folksarroll, ANP-C Afib Clinic [facility] [address] [place], [place] [address] [phone]

## 2017-04-18 ENCOUNTER — Encounter (HOSPITAL_COMMUNITY)
Admission: RE | Admit: 2017-04-18 | Discharge: 2017-04-18 | Disposition: A | Discharge status: Home / Self Care | Payer: Medicare Other | Source: Ambulatory Visit | Attending: Cardiovascular Disease | Admitting: Cardiovascular Disease

## 2017-04-18 ENCOUNTER — Telehealth: Payer: Self-pay | Admitting: Pharmacist

## 2017-04-18 MED ORDER — IRBESARTAN 150 MG PO TABS: 150.0000 mg | ORAL_TABLET | Freq: Every day | ORAL | 3 refills | Status: DC

## 2017-04-18 NOTE — Telephone Encounter (Signed)
Pt called stating his valsartan rx was affected by recall. Switched valsartan 160mg  daily to irbesartan 150mg  daily. Pt is aware to monitor BP over the next few weeks.

## 2017-04-21 ENCOUNTER — Encounter (HOSPITAL_COMMUNITY): Payer: Medicare Other

## 2017-04-23 ENCOUNTER — Ambulatory Visit (HOSPITAL_COMMUNITY)
Admission: RE | Admit: 2017-04-23 | Discharge: 2017-04-23 | Disposition: A | Discharge status: Home / Self Care | Payer: Medicare Other | Source: Ambulatory Visit | Attending: Nurse Practitioner | Admitting: Nurse Practitioner

## 2017-04-23 ENCOUNTER — Encounter (HOSPITAL_COMMUNITY)
Admission: RE | Admit: 2017-04-23 | Discharge: 2017-04-23 | Disposition: A | Discharge status: Home / Self Care | Payer: Self-pay | Source: Ambulatory Visit | Attending: Cardiovascular Disease | Admitting: Cardiovascular Disease

## 2017-04-23 ENCOUNTER — Encounter (HOSPITAL_COMMUNITY): Payer: Self-pay | Admitting: Nurse Practitioner

## 2017-04-23 VITALS — BP 116/60 | HR 55 | Ht 73.0 in | Wt 248.0 lb

## 2017-04-23 DIAGNOSIS — Z7984 Long term (current) use of oral hypoglycemic drugs: Secondary | ICD-10-CM | POA: Insufficient documentation

## 2017-04-23 DIAGNOSIS — Z951 Presence of aortocoronary bypass graft: Secondary | ICD-10-CM | POA: Diagnosis not present

## 2017-04-23 DIAGNOSIS — I10 Essential (primary) hypertension: Secondary | ICD-10-CM | POA: Diagnosis not present

## 2017-04-23 DIAGNOSIS — Z87891 Personal history of nicotine dependence: Secondary | ICD-10-CM | POA: Insufficient documentation

## 2017-04-23 DIAGNOSIS — I481 Persistent atrial fibrillation: Secondary | ICD-10-CM | POA: Diagnosis not present

## 2017-04-23 DIAGNOSIS — Z8249 Family history of ischemic heart disease and other diseases of the circulatory system: Secondary | ICD-10-CM | POA: Diagnosis not present

## 2017-04-23 DIAGNOSIS — Z880 Allergy status to penicillin: Secondary | ICD-10-CM | POA: Insufficient documentation

## 2017-04-23 DIAGNOSIS — Z79899 Other long term (current) drug therapy: Secondary | ICD-10-CM | POA: Insufficient documentation

## 2017-04-23 DIAGNOSIS — Z7901 Long term (current) use of anticoagulants: Secondary | ICD-10-CM | POA: Insufficient documentation

## 2017-04-23 DIAGNOSIS — E785 Hyperlipidemia, unspecified: Secondary | ICD-10-CM | POA: Insufficient documentation

## 2017-04-23 DIAGNOSIS — I4891 Unspecified atrial fibrillation: Secondary | ICD-10-CM | POA: Insufficient documentation

## 2017-04-23 DIAGNOSIS — I4892 Unspecified atrial flutter: Secondary | ICD-10-CM | POA: Diagnosis not present

## 2017-04-23 DIAGNOSIS — I2510 Atherosclerotic heart disease of native coronary artery without angina pectoris: Secondary | ICD-10-CM | POA: Diagnosis not present

## 2017-04-23 DIAGNOSIS — I4819 Other persistent atrial fibrillation: Secondary | ICD-10-CM

## 2017-04-23 LAB — CBC
HEMATOCRIT: 41.6 % (ref 39.0–52.0)
HEMOGLOBIN: 14 g/dL (ref 13.0–17.0)
MCH: 30.3 pg (ref 26.0–34.0)
MCHC: 33.7 g/dL (ref 30.0–36.0)
MCV: 90 fL (ref 78.0–100.0)
Platelets: 239 10*3/uL (ref 150–400)
RBC: 4.62 MIL/uL (ref 4.22–5.81)
RDW: 13.7 % (ref 11.5–15.5)
WBC: 8.5 10*3/uL (ref 4.0–10.5)

## 2017-04-23 MED ORDER — METOPROLOL SUCCINATE ER 25 MG PO TB24: 12.5000 mg | ORAL_TABLET | Freq: Every day | ORAL | 10 refills | Status: DC

## 2017-04-23 NOTE — Patient Instructions (Signed)
Your physician has recommended you make the following change in your medication:  1)Decrease metoprolol to 1/2 tablet daily

## 2017-04-23 NOTE — Progress Notes (Signed)
Primary Care Physician: Marden NobleGates, Robert, MD Referring Physician: [facility]   Lorelei PontJames A Wells is a 83 y.o. male with a h/o CAD, s/p bypass in 2014,HTN, remote afib that was seen in the afib clinic, 7/24, for f/u of afib found in cardiac rehab yesterday. He remains in rate controlled afib today but is asymptomatic. He gives a h/o of a drop in h/h while on anticoagulation for afib in 2016 and anticoagulation was stopped. He had an upper endoscopy and colonoscopy without any source of bleeding. He reports that H/H stabilized with addition of iron. He  has a chadsvasc score of at least 4.  Pt believes that onset of afib may have been due to stress from a family situation and having family members in last week and drinking heavier amounts of alcohol. He feels well in rate controlled afuib and did not want any further intervention. He was started on anticoagulation.  F/u in afib clinic 8/1. Still is asymptomatic in afb but has noted heart rate to be running in low 50's/upper 40's at times. He has been back to cardiac rehab and did well exercising. Slightly sluggish when heart rate is low. CBC pending today with h/o prior anemia without bleeding source. No obvious bleeding with start of eliquis.  Today, he denies symptoms of palpitations, chest pain, shortness of breath, orthopnea, PND, lower extremity edema, dizziness, presyncope, syncope, or neurologic sequela. The patient is tolerating medications without difficulties and is otherwise without complaint today.   Past Medical History:  Diagnosis Date  . CAD (coronary artery disease), severe needing CABG 01/19/2013  . Chest pain    2D ECHO, 06/16/2012 - EF 50-55%, borderline low left ventricular systolic function  . Coronary artery disease   . Dyspnea on exertion    LEXISCAN, 06/17/2012 - fixed inferior bowel artifact and basal septal defect, probably related to IVCD/incomplete LBBB, no reversible ischemia, post-stress EF 66%  . History of AAA  (abdominal aortic aneurysm) repair    ABDOMINAL AORTIC DOPPLER, 06/16/2013 - normal  . HTN (hypertension) 01/19/2013  . Hyperlipidemia LDL goal < 70 01/19/2013  . Hypertension   . S/P CABG x 3 01/19/2013   Past Surgical History:  Procedure Laterality Date  . CARDIAC CATHETERIZATION  01/15/2013   Severe coronary obstructive disease with 60-70% ostial left main and 90-85% distal LAD stenosis and mild-moderate stenoses in RCA, recommended for CABG  . CARDIOVERSION N/A 11/12/2013   Procedure: CARDIOVERSION;  Surgeon: Lennette Biharihomas A Kelly, MD;  [facility];  Service: Cardiovascular;  Laterality: N/A;  11:16 Lido 60mg , Propofol 120mg ,IV 11:19 synch 100 joules conversion conversion to NSR....  . CORONARY ARTERY BYPASS GRAFT N/A 01/15/2013   Procedure: Coronary Artery Bypass Graft times three using Right Greater Saphenous Vein Graft harvested endoscopically and Left Internal Mammary Artery and;  Surgeon: Alleen BorneBryan K Bartle, MD;  Location: [facility] OR;  Service: Open Heart Surgery;  Laterality: N/A;  . INTRAOPERATIVE TRANSESOPHAGEAL ECHOCARDIOGRAM  01/15/2013   Procedure: INTRAOPERATIVE TRANSESOPHAGEAL ECHOCARDIOGRAM;  Surgeon: Alleen BorneBryan K Bartle, MD;  Location: [facility] OR;  Service: Open Heart Surgery;;  . LEFT HEART CATHETERIZATION WITH CORONARY ANGIOGRAM N/A 01/15/2013   Procedure: LEFT HEART CATHETERIZATION WITH CORONARY ANGIOGRAM;  Surgeon: Lennette Biharihomas A Kelly, MD;  [facility];  Service: Cardiovascular;  Laterality: N/A;  . VASCULAR SURGERY      Current Outpatient Prescriptions  Medication Sig Dispense Refill  . amLODipine (NORVASC) 5 MG tablet Take 1 tablet (5 mg total) by mouth daily. 30 tablet 11  .  apixaban (ELIQUIS) 5 MG TABS tablet Take 1 tablet (5 mg total) by mouth 2 (two) times daily. 60 tablet 3  . atorvastatin (LIPITOR) 40 MG tablet Take 1 tablet (40 mg total) by mouth daily. 90 tablet 3  . irbesartan (AVAPRO) 150 MG tablet Take 1 tablet (150 mg total) by mouth daily. 90 tablet 3  . iron  polysaccharides (NIFEREX) 150 MG capsule Take 1 capsule (150 mg total) by mouth 2 (two) times daily. 60 capsule 3  . metFORMIN (GLUCOPHAGE) 500 MG tablet Take 500 mg by mouth daily.    . metoprolol succinate (TOPROL-XL) 25 MG 24 hr tablet Take 0.5 tablets (12.5 mg total) by mouth daily. 30 tablet 10  . Multiple Vitamin (MULTIVITAMIN WITH MINERALS) TABS Take 1 tablet by mouth daily.     No current facility-administered medications for this encounter.     Allergies  Allergen Reactions  . Penicillins Rash    Social History   Social History  . Marital status: Married    Spouse name: N/A  . Number of children: N/A  . Years of education: N/A   Occupational History  . Not on file.   Social History Main Topics  . Smoking status: Former Smoker    Types: Cigars    Quit date: 01/16/2013  . Smokeless tobacco: Never Used  . Alcohol use 8.4 oz/week    14 Standard drinks or equivalent per week  . Drug use: Unknown  . Sexual activity: Not on file   Other Topics Concern  . Not on file   Social History Narrative  . No narrative on file    Family History  Problem Relation Age of Onset  . Hyperlipidemia Mother   . Heart disease Mother   . Heart disease Father   . Hypertension Father   . Heart disease Brother     ROS- All systems are reviewed and negative except as per the HPI above  Physical Exam: Vitals:   04/23/17 1006  BP: 116/60  Pulse: (!) 55  Weight: 248 lb (112.5 kg)  Height: 6\' 1"  (1.854 m)   Wt Readings from Last 3 Encounters:  04/23/17 248 lb (112.5 kg)  04/15/17 244 lb (110.7 kg)  03/03/17 247 lb (112 kg)    Labs: Lab Results  Component Value Date   NA 138 04/15/2017   K 5.2 (H) 04/15/2017   CL 104 04/15/2017   CO2 26 04/15/2017   GLUCOSE 137 (H) 04/15/2017   BUN 21 (H) 04/15/2017   CREATININE 1.10 04/15/2017   CALCIUM 9.8 04/15/2017   MG 2.0 10/12/2013   Lab Results  Component Value Date   INR 1.89 (H) 11/08/2013   Lab Results  Component  Value Date   CHOL 148 03/07/2017   HDL 42 03/07/2017   LDLCALC 86 03/07/2017   TRIG 101 03/07/2017     GEN- The patient is well appearing, alert and oriented x 3 today.   Head- normocephalic, atraumatic Eyes-  Sclera clear, conjunctiva pink Ears- hearing intact Oropharynx- clear Neck- supple, no JVP Lymph- no cervical lymphadenopathy Lungs- Clear to ausculation bilaterally, normal work of breathing Heart- irregular rate and rhythm, no murmurs, rubs or gallops, PMI not laterally displaced GI- soft, NT, ND, + BS Extremities- no clubbing, cyanosis, or edema MS- no significant deformity or atrophy Skin- no rash or lesion Psych- euthymic mood, full affect Neuro- strength and sensation are intact  EKG- afib with slow v response at 55 bpm  Epic records reviewed  Assessment and Plan: 1. Asymptomatic afib Rate controlled but  with slow v rate Discussed monitor but he declinied Reduce metoprolol to 12.5 mg a day He does have a chadsvasc score of 4 Discussed  drop in H/H in past without any bleeding source identified CBC obtained today is normal Continue eliquis 5 mg bid Repeat cbc in one month Bleeding precautions discussed  Off asa, no NSAIDS Advised decrease alcohol to no more than 2 drinks a week  Pt currently is not interested in possible cardioversion, if persists, after fully anticoagulated, as he says that he is not aware of the afib He has returned to rehab F/u with  Dr. Tresa EndoKelly in October  Lupita Leashonna C. Matthew Folksarroll, ANP-C Afib Clinic [facility] [address] [place], [place] [address] [phone]

## 2017-04-25 ENCOUNTER — Encounter (HOSPITAL_COMMUNITY)
Admission: RE | Admit: 2017-04-25 | Discharge: 2017-04-25 | Disposition: A | Discharge status: Home / Self Care | Payer: Self-pay | Source: Ambulatory Visit | Attending: Cardiovascular Disease | Admitting: Cardiovascular Disease

## 2017-04-28 ENCOUNTER — Encounter (HOSPITAL_COMMUNITY)
Admission: RE | Admit: 2017-04-28 | Discharge: 2017-04-28 | Disposition: A | Discharge status: Home / Self Care | Payer: Medicare Other | Source: Ambulatory Visit | Attending: Cardiovascular Disease | Admitting: Cardiovascular Disease

## 2017-04-30 ENCOUNTER — Encounter (HOSPITAL_COMMUNITY): Payer: Self-pay

## 2017-05-02 ENCOUNTER — Encounter (HOSPITAL_COMMUNITY)
Admission: RE | Admit: 2017-05-02 | Discharge: 2017-05-02 | Disposition: A | Discharge status: Home / Self Care | Payer: Self-pay | Source: Ambulatory Visit | Attending: Cardiovascular Disease | Admitting: Cardiovascular Disease

## 2017-05-05 ENCOUNTER — Encounter (HOSPITAL_COMMUNITY)
Admission: RE | Admit: 2017-05-05 | Discharge: 2017-05-05 | Disposition: A | Discharge status: Home / Self Care | Payer: Self-pay | Source: Ambulatory Visit | Attending: Cardiovascular Disease | Admitting: Cardiovascular Disease

## 2017-05-07 ENCOUNTER — Encounter (HOSPITAL_COMMUNITY)
Admission: RE | Admit: 2017-05-07 | Discharge: 2017-05-07 | Disposition: A | Discharge status: Home / Self Care | Payer: Self-pay | Source: Ambulatory Visit | Attending: Cardiovascular Disease | Admitting: Cardiovascular Disease

## 2017-05-09 ENCOUNTER — Ambulatory Visit (HOSPITAL_COMMUNITY)
Admission: RE | Admit: 2017-05-09 | Discharge: 2017-05-09 | Disposition: A | Discharge status: Home / Self Care | Payer: Medicare Other | Source: Ambulatory Visit | Attending: Nurse Practitioner | Admitting: Nurse Practitioner

## 2017-05-09 ENCOUNTER — Encounter (HOSPITAL_COMMUNITY): Payer: Self-pay

## 2017-05-09 ENCOUNTER — Encounter (HOSPITAL_COMMUNITY): Payer: Self-pay | Admitting: Nurse Practitioner

## 2017-05-09 ENCOUNTER — Telehealth: Payer: Self-pay | Admitting: Cardiovascular Disease

## 2017-05-09 ENCOUNTER — Telehealth (HOSPITAL_COMMUNITY): Payer: Self-pay | Admitting: *Deleted

## 2017-05-09 VITALS — BP 126/58 | HR 62 | Ht 73.0 in | Wt 246.4 lb

## 2017-05-09 DIAGNOSIS — I10 Essential (primary) hypertension: Secondary | ICD-10-CM | POA: Insufficient documentation

## 2017-05-09 DIAGNOSIS — Z01812 Encounter for preprocedural laboratory examination: Secondary | ICD-10-CM | POA: Insufficient documentation

## 2017-05-09 DIAGNOSIS — Z79899 Other long term (current) drug therapy: Secondary | ICD-10-CM | POA: Diagnosis not present

## 2017-05-09 DIAGNOSIS — I2510 Atherosclerotic heart disease of native coronary artery without angina pectoris: Secondary | ICD-10-CM | POA: Diagnosis not present

## 2017-05-09 DIAGNOSIS — I454 Nonspecific intraventricular block: Secondary | ICD-10-CM | POA: Diagnosis not present

## 2017-05-09 DIAGNOSIS — Z7984 Long term (current) use of oral hypoglycemic drugs: Secondary | ICD-10-CM | POA: Insufficient documentation

## 2017-05-09 DIAGNOSIS — I4819 Other persistent atrial fibrillation: Secondary | ICD-10-CM

## 2017-05-09 DIAGNOSIS — Z9889 Other specified postprocedural states: Secondary | ICD-10-CM | POA: Diagnosis not present

## 2017-05-09 DIAGNOSIS — Z880 Allergy status to penicillin: Secondary | ICD-10-CM | POA: Insufficient documentation

## 2017-05-09 DIAGNOSIS — Z951 Presence of aortocoronary bypass graft: Secondary | ICD-10-CM | POA: Diagnosis not present

## 2017-05-09 DIAGNOSIS — Z8249 Family history of ischemic heart disease and other diseases of the circulatory system: Secondary | ICD-10-CM | POA: Insufficient documentation

## 2017-05-09 DIAGNOSIS — Z87891 Personal history of nicotine dependence: Secondary | ICD-10-CM | POA: Insufficient documentation

## 2017-05-09 DIAGNOSIS — I481 Persistent atrial fibrillation: Secondary | ICD-10-CM

## 2017-05-09 DIAGNOSIS — Z01810 Encounter for preprocedural cardiovascular examination: Secondary | ICD-10-CM | POA: Diagnosis not present

## 2017-05-09 DIAGNOSIS — Z7901 Long term (current) use of anticoagulants: Secondary | ICD-10-CM | POA: Insufficient documentation

## 2017-05-09 LAB — CBC
HCT: 42 % (ref 39.0–52.0)
HEMOGLOBIN: 13.9 g/dL (ref 13.0–17.0)
MCH: 29.9 pg (ref 26.0–34.0)
MCHC: 33.1 g/dL (ref 30.0–36.0)
MCV: 90.3 fL (ref 78.0–100.0)
PLATELETS: 261 10*3/uL (ref 150–400)
RBC: 4.65 MIL/uL (ref 4.22–5.81)
RDW: 14.2 % (ref 11.5–15.5)
WBC: 8.6 10*3/uL (ref 4.0–10.5)

## 2017-05-09 LAB — BASIC METABOLIC PANEL
Anion gap: 8 (ref 5–15)
BUN: 15 mg/dL (ref 6–20)
CHLORIDE: 108 mmol/L (ref 101–111)
CO2: 23 mmol/L (ref 22–32)
CREATININE: 0.95 mg/dL (ref 0.61–1.24)
Calcium: 9.3 mg/dL (ref 8.9–10.3)
Glucose, Bld: 134 mg/dL — ABNORMAL HIGH (ref 65–99)
Potassium: 3.9 mmol/L (ref 3.5–5.1)
SODIUM: 139 mmol/L (ref 135–145)

## 2017-05-09 NOTE — Telephone Encounter (Signed)
Left a message to call back, per DPR. 

## 2017-05-09 NOTE — Telephone Encounter (Signed)
Pt cld clinic reporting SOB and difficulty sleeping. He also left a msg with RN at [facility].  Pt stated that he missed the return call and was wanting to know what he should do regarding symptoms.   Pt is on half dose of Metoprolol.  He will come in to clinic this morning to have blood checked and vitals  70 HR 156/69 BP

## 2017-05-09 NOTE — Patient Instructions (Addendum)
Cardioversion scheduled for Tuesday, August 21st  - Arrive at the [facility] and go to admitting at 9:30AM  -Do not eat or drink anything after midnight the night prior to your procedure.  - Take all your medication with a sip of water prior to arrival EXCEPT metoprolol.  - You will not be able to drive home after your procedure.

## 2017-05-09 NOTE — Telephone Encounter (Signed)
New Message  Pt call requesting to speak with RN. Pt states he feels like he has been in Afib for about a week. Pt states he has been struggling with sleeping at night. Pt states he has been experiencing some SOB as well. Please call back to discuss

## 2017-05-09 NOTE — Progress Notes (Signed)
Primary Care Physician: Marden Noble, MD Referring Physician: [facility] cardiac rehab   Derek Wells is a 83 y.o. male with a h/o CAD, s/p bypass in 2014,HTN, remote afib that was seen in the afib clinic, 7/24, for f/u of afib found in cardiac rehab yesterday. He remains in rate controlled afib today but is asymptomatic. He gives a h/o of a drop in h/h while on anticoagulation for afib in 2016 and anticoagulation was stopped. He had an upper endoscopy and colonoscopy without any source of bleeding. He reports that H/H stabilized with addition of iron. He  has a chadsvasc score of at least 4.  Pt believes that onset of afib may have been due to stress from a family situation and having family members in last week and drinking heavier amounts of alcohol. He feels well in rate controlled afuib and did not want any further intervention. He was started on anticoagulation.  F/u in afib clinic 8/1. Still is asymptomatic in afb but has noted heart rate to be running in low 50's/upper 40's at times. He has been back to cardiac rehab and did well exercising. Slightly sluggish when heart rate is low. CBC pending today with h/o prior anemia without bleeding source. No obvious bleeding with start of eliquis.  F/u in afib clinic 8/17 at pt request. He reports that he can not take a good deep breath and he is waking up at night and can not get back to sleep. Fluid status is normal, actually down 2 lbs. He has not noted any bleeding. He remains in afib at 62 bpm. Stat CBC with normal blood count. He feels that the afib may be getting to him and is now willing to have a cardioversion. He had one in the remote past which did keep him in rhythm for some time.  Today, he denies symptoms of palpitations, chest pain, shortness of breath, orthopnea, PND, lower extremity edema, dizziness, presyncope, syncope, or neurologic sequela. The patient is tolerating medications without difficulties and is otherwise without complaint  today.   Past Medical History:  Diagnosis Date  . CAD (coronary artery disease), severe needing CABG 01/19/2013  . Chest pain    2D ECHO, 06/16/2012 - EF 50-55%, borderline low left ventricular systolic function  . Coronary artery disease   . Dyspnea on exertion    LEXISCAN, 06/17/2012 - fixed inferior bowel artifact and basal septal defect, probably related to IVCD/incomplete LBBB, no reversible ischemia, post-stress EF 66%  . History of AAA (abdominal aortic aneurysm) repair    ABDOMINAL AORTIC DOPPLER, 06/16/2013 - normal  . HTN (hypertension) 01/19/2013  . Hyperlipidemia LDL goal < 70 01/19/2013  . Hypertension   . S/P CABG x 3 01/19/2013   Past Surgical History:  Procedure Laterality Date  . CARDIAC CATHETERIZATION  01/15/2013   Severe coronary obstructive disease with 60-70% ostial left main and 90-85% distal LAD stenosis and mild-moderate stenoses in RCA, recommended for CABG  . CARDIOVERSION N/A 11/12/2013   Procedure: CARDIOVERSION;  Surgeon: Lennette Bihari, MD;  Location: [facility] ENDOSCOPY;  Service: Cardiovascular;  Laterality: N/A;  11:16 Lido 60mg , Propofol 120mg ,IV 11:19 synch 100 joules conversion conversion to NSR....  . CORONARY ARTERY BYPASS GRAFT N/A 01/15/2013   Procedure: Coronary Artery Bypass Graft times three using Right Greater Saphenous Vein Graft harvested endoscopically and Left Internal Mammary Artery and;  Surgeon: Alleen Borne, MD;  Location: [facility] OR;  Service: Open Heart Surgery;  Laterality: N/A;  . INTRAOPERATIVE TRANSESOPHAGEAL ECHOCARDIOGRAM  01/15/2013  Procedure: INTRAOPERATIVE TRANSESOPHAGEAL ECHOCARDIOGRAM;  Surgeon: Alleen Borne, MD;  Location: [facility] OR;  Service: Open Heart Surgery;;  . LEFT HEART CATHETERIZATION WITH CORONARY ANGIOGRAM N/A 01/15/2013   Procedure: LEFT HEART CATHETERIZATION WITH CORONARY ANGIOGRAM;  Surgeon: Lennette Bihari, MD;  Location: [facility] CATH LAB;  Service: Cardiovascular;  Laterality: N/A;  . VASCULAR SURGERY      Current Outpatient  Prescriptions  Medication Sig Dispense Refill  . amLODipine (NORVASC) 5 MG tablet Take 1 tablet (5 mg total) by mouth daily. 30 tablet 11  . apixaban (ELIQUIS) 5 MG TABS tablet Take 1 tablet (5 mg total) by mouth 2 (two) times daily. 60 tablet 3  . atorvastatin (LIPITOR) 40 MG tablet Take 1 tablet (40 mg total) by mouth daily. 90 tablet 3  . irbesartan (AVAPRO) 150 MG tablet Take 1 tablet (150 mg total) by mouth daily. 90 tablet 3  . metFORMIN (GLUCOPHAGE) 500 MG tablet Take 500 mg by mouth daily.    . metoprolol succinate (TOPROL-XL) 25 MG 24 hr tablet Take 0.5 tablets (12.5 mg total) by mouth daily. 30 tablet 10  . Multiple Vitamin (MULTIVITAMIN WITH MINERALS) TABS Take 1 tablet by mouth daily.     No current facility-administered medications for this encounter.     Allergies  Allergen Reactions  . Penicillins Rash    Social History   Social History  . Marital status: Married    Spouse name: N/A  . Number of children: N/A  . Years of education: N/A   Occupational History  . Not on file.   Social History Main Topics  . Smoking status: Former Smoker    Types: Cigars    Quit date: 01/16/2013  . Smokeless tobacco: Never Used  . Alcohol use 8.4 oz/week    14 Standard drinks or equivalent per week  . Drug use: Unknown  . Sexual activity: Not on file   Other Topics Concern  . Not on file   Social History Narrative  . No narrative on file    Family History  Problem Relation Age of Onset  . Hyperlipidemia Mother   . Heart disease Mother   . Heart disease Father   . Hypertension Father   . Heart disease Brother     ROS- All systems are reviewed and negative except as per the HPI above  Physical Exam: Vitals:   05/09/17 1116  BP: (!) 126/58  Pulse: 62  SpO2: 94%  Weight: 246 lb 6.4 oz (111.8 kg)  Height: 6\' 1"  (1.854 m)   Wt Readings from Last 3 Encounters:  05/09/17 246 lb 6.4 oz (111.8 kg)  04/23/17 248 lb (112.5 kg)  04/15/17 244 lb (110.7 kg)     Labs: Lab Results  Component Value Date   NA 139 05/09/2017   K 3.9 05/09/2017   CL 108 05/09/2017   CO2 23 05/09/2017   GLUCOSE 134 (H) 05/09/2017   BUN 15 05/09/2017   CREATININE 0.95 05/09/2017   CALCIUM 9.3 05/09/2017   MG 2.0 10/12/2013   Lab Results  Component Value Date   INR 1.89 (H) 11/08/2013   Lab Results  Component Value Date   CHOL 148 03/07/2017   HDL 42 03/07/2017   LDLCALC 86 03/07/2017   TRIG 101 03/07/2017   CBC WNL  GEN- The patient is well appearing, alert and oriented x 3 today.   Head- normocephalic, atraumatic Eyes-  Sclera clear, conjunctiva pink Ears- hearing intact Oropharynx- clear Neck- supple, no JVP Lymph- no cervical  lymphadenopathy Lungs- Clear to ausculation bilaterally, normal work of breathing Heart- irregular rate and rhythm, no murmurs, rubs or gallops, PMI not laterally displaced GI- soft, NT, ND, + BS Extremities- no clubbing, cyanosis, or edema MS- no significant deformity or atrophy Skin- no rash or lesion Psych- euthymic mood, full affect Neuro- strength and sensation are intact  EKG- afib  at 62 bpm, LAD, NSIVB(chronic) Epic records reviewed      Assessment and Plan: 1. Persistent  afib Rate controlled  Initially asymptomatic but now feels that it is making him symptomatic with fragmented sleep, not being to take a full breath CBC WNL, no bleeding issues Has been on DOAC at least  3 weeks, no interruption He is now ready to have a cardioversion as he wanted to avoid a few weeks ago Hold metoprolol  12.5 mg am of cardioversion He does have a chadsvasc score of 4 Continue eliquis 5 mg bid Advised decrease alcohol to no more than 2 drinks a week  Cardioversion scheduled for Tuesday, 8/21 a 9:30a F/u in afib clinic x one week F/u with  Dr. Tresa Endo in October  Lupita Leash C. Matthew Folks Afib Clinic [facility] [address] [place], [place] [address] [phone]

## 2017-05-11 ENCOUNTER — Other Ambulatory Visit: Payer: Self-pay | Admitting: Cardiovascular Disease

## 2017-05-12 ENCOUNTER — Encounter (HOSPITAL_COMMUNITY): Payer: Self-pay

## 2017-05-12 NOTE — Telephone Encounter (Signed)
Rx(s) sent to pharmacy electronically.  

## 2017-05-12 NOTE — Telephone Encounter (Signed)
Patient has been seen by atrial fib clinic.

## 2017-05-13 ENCOUNTER — Ambulatory Visit (HOSPITAL_COMMUNITY): Payer: Medicare Other | Admitting: Certified Registered"

## 2017-05-13 ENCOUNTER — Encounter (HOSPITAL_COMMUNITY)
Admission: RE | Disposition: A | Discharge status: Home / Self Care | Payer: Self-pay | Source: Ambulatory Visit | Attending: Cardiovascular Disease

## 2017-05-13 ENCOUNTER — Ambulatory Visit (HOSPITAL_COMMUNITY)
Admission: RE | Admit: 2017-05-13 | Discharge: 2017-05-13 | Disposition: A | Discharge status: Home / Self Care | Payer: Medicare Other | Source: Ambulatory Visit | Attending: Cardiovascular Disease | Admitting: Cardiovascular Disease

## 2017-05-13 ENCOUNTER — Encounter (HOSPITAL_COMMUNITY): Payer: Self-pay | Admitting: *Deleted

## 2017-05-13 ENCOUNTER — Other Ambulatory Visit (HOSPITAL_COMMUNITY): Payer: Self-pay | Admitting: *Deleted

## 2017-05-13 DIAGNOSIS — Z7901 Long term (current) use of anticoagulants: Secondary | ICD-10-CM | POA: Diagnosis not present

## 2017-05-13 DIAGNOSIS — E785 Hyperlipidemia, unspecified: Secondary | ICD-10-CM | POA: Diagnosis not present

## 2017-05-13 DIAGNOSIS — I4892 Unspecified atrial flutter: Secondary | ICD-10-CM | POA: Diagnosis not present

## 2017-05-13 DIAGNOSIS — Z880 Allergy status to penicillin: Secondary | ICD-10-CM | POA: Diagnosis not present

## 2017-05-13 DIAGNOSIS — I481 Persistent atrial fibrillation: Secondary | ICD-10-CM | POA: Diagnosis not present

## 2017-05-13 DIAGNOSIS — D649 Anemia, unspecified: Secondary | ICD-10-CM | POA: Insufficient documentation

## 2017-05-13 DIAGNOSIS — I2510 Atherosclerotic heart disease of native coronary artery without angina pectoris: Secondary | ICD-10-CM | POA: Insufficient documentation

## 2017-05-13 DIAGNOSIS — Z87891 Personal history of nicotine dependence: Secondary | ICD-10-CM | POA: Diagnosis not present

## 2017-05-13 DIAGNOSIS — I10 Essential (primary) hypertension: Secondary | ICD-10-CM | POA: Diagnosis not present

## 2017-05-13 DIAGNOSIS — I4819 Other persistent atrial fibrillation: Secondary | ICD-10-CM

## 2017-05-13 DIAGNOSIS — Z951 Presence of aortocoronary bypass graft: Secondary | ICD-10-CM | POA: Diagnosis not present

## 2017-05-13 HISTORY — PX: CARDIOVERSION: SHX1299

## 2017-05-13 LAB — GLUCOSE, CAPILLARY: GLUCOSE-CAPILLARY: 101 mg/dL — AB (ref 65–99)

## 2017-05-13 SURGERY — CARDIOVERSION
Anesthesia: Monitor Anesthesia Care

## 2017-05-13 MED ORDER — LIDOCAINE HCL (CARDIAC) 20 MG/ML IV SOLN
INTRAVENOUS | Status: DC | PRN
  Administered 2017-05-13: 40 mg via INTRAVENOUS

## 2017-05-13 MED ORDER — PROPOFOL 10 MG/ML IV BOLUS
INTRAVENOUS | Status: DC | PRN
  Administered 2017-05-13: 85 mg via INTRAVENOUS

## 2017-05-13 MED ORDER — LACTATED RINGERS IV SOLN
INTRAVENOUS | Status: DC | PRN
  Administered 2017-05-13: 11:00:00 via INTRAVENOUS

## 2017-05-13 MED ORDER — APIXABAN 5 MG PO TABS: 5.0000 mg | ORAL_TABLET | Freq: Two times a day (BID) | ORAL | 3 refills | Status: DC

## 2017-05-13 NOTE — Discharge Instructions (Signed)
Electrical Cardioversion, Care After °This sheet gives you information about how to care for yourself after your procedure. Your health care provider may also give you more specific instructions. If you have problems or questions, contact your health care provider. °What can I expect after the procedure? °After the procedure, it is common to have: °· Some redness on the skin where the shocks were given. ° °Follow these instructions at home: °· Do not drive for 24 hours if you were given a medicine to help you relax (sedative). °· Take over-the-counter and prescription medicines only as told by your health care provider. °· Ask your health care provider how to check your pulse. Check it often. °· Rest for 48 hours after the procedure or as told by your health care provider. °· Avoid or limit your caffeine use as told by your health care provider. °Contact a health care provider if: °· You feel like your heart is beating too quickly or your pulse is not regular. °· You have a serious muscle cramp that does not go away. °Get help right away if: °· You have discomfort in your chest. °· You are dizzy or you feel faint. °· You have trouble breathing or you are short of breath. °· Your speech is slurred. °· You have trouble moving an arm or leg on one side of your body. °· Your fingers or toes turn cold or blue. °This information is not intended to replace advice given to you by your health care provider. Make sure you discuss any questions you have with your health care provider. °Document Released: 06/30/2013 Document Revised: 04/12/2016 Document Reviewed: 03/15/2016 °Elsevier Interactive Patient Education © 2018 [employer] ° °

## 2017-05-13 NOTE — Interval H&P Note (Signed)
History and Physical Interval Note:  05/13/2017 10:04 AM  Derek Wells  has presented today for surgery, with the diagnosis of afib  The various methods of treatment have been discussed with the patient and family. After consideration of risks, benefits and other options for treatment, the patient has consented to  Procedure(s): CARDIOVERSION (N/A) as a surgical intervention .  The patient's history has been reviewed, patient examined, no change in status, stable for surgery.  I have reviewed the patient's chart and labs.  Questions were answered to the patient's satisfaction.     Kristeen Miss

## 2017-05-13 NOTE — Transfer of Care (Signed)
Immediate Anesthesia Transfer of Care Note  Patient: Derek Wells  Procedure(s) Performed: Procedure(s): CARDIOVERSION (N/A)  Patient Location: Endoscopy Unit  Anesthesia Type:General  Level of Consciousness: awake, alert  and oriented  Airway & Oxygen Therapy: Patient connected to nasal cannula oxygen  Post-op Assessment: Post -op Vital signs reviewed and stable  Post vital signs: stable  Last Vitals:  Vitals:   05/13/17 0949  BP: (!) 141/56  Pulse: (!) 57  Resp: (!) 21  Temp: 36.7 C  SpO2: 96%    Last Pain:  Vitals:   05/13/17 0949  TempSrc: Oral         Complications: No apparent anesthesia complications

## 2017-05-13 NOTE — Anesthesia Procedure Notes (Addendum)
Procedure Name: General with mask airway Date/Time: 05/13/2017 11:28 AM Performed by: Dorris Singh Pre-anesthesia Checklist: Patient identified, Emergency Drugs available, Suction available, Patient being monitored and Timeout performed Patient Re-evaluated:Patient Re-evaluated prior to induction Oxygen Delivery Method: Ambu bag Preoxygenation: Pre-oxygenation with 100% oxygen Induction Type: IV induction Ventilation: Mask ventilation without difficulty Dental Injury: Teeth and Oropharynx as per pre-operative assessment

## 2017-05-13 NOTE — Anesthesia Preprocedure Evaluation (Signed)
Anesthesia Evaluation  Patient identified by MRN, date of birth, ID band Patient awake    Reviewed: Allergy & Precautions, NPO status , Patient's Chart, lab work & pertinent test results  Airway Mallampati: II  TM Distance: >3 FB     Dental   Pulmonary former smoker,    breath sounds clear to auscultation       Cardiovascular hypertension, + angina + CAD   Rhythm:Regular Rate:Normal     Neuro/Psych    GI/Hepatic negative GI ROS,   Endo/Other    Renal/GU      Musculoskeletal   Abdominal   Peds  Hematology  (+) anemia ,   Anesthesia Other Findings   Reproductive/Obstetrics                             Anesthesia Physical Anesthesia Plan  ASA: III  Anesthesia Plan: MAC   Post-op Pain Management:    Induction: Intravenous  PONV Risk Score and Plan: 1 and Ondansetron and Propofol infusion  Airway Management Planned: Simple Face Mask  Additional Equipment:   Intra-op Plan:   Post-operative Plan:   Informed Consent: I have reviewed the patients History and Physical, chart, labs and discussed the procedure including the risks, benefits and alternatives for the proposed anesthesia with the patient or authorized representative who has indicated his/her understanding and acceptance.   Dental advisory given  Plan Discussed with: CRNA and Anesthesiologist  Anesthesia Plan Comments:         Anesthesia Quick Evaluation

## 2017-05-13 NOTE — CV Procedure (Signed)
    Cardioversion Note  JESSUS TWOHIG [id number] [birth year]  Procedure: DC Cardioversion Indications: atrial fib   Procedure Details Consent: Obtained Time Out: Verified patient identification, verified procedure, site/side was marked, verified correct patient position, special equipment/implants available, Radiology Safety Procedures followed,  medications/allergies/relevent history reviewed, required imaging and test results available.  Performed  The patient has been on adequate anticoagulation.  The patient received IV Lidocaine 40 mg followed by Propofol 85 mg IV  for sedation.  Synchronous cardioversion was performed at 120  joules.  The cardioversion was successful     Complications: No apparent complications Patient did tolerate procedure well.   Vesta Mixer, Montez Hageman., MD, [facility] 05/13/2017, 11:34 AM

## 2017-05-13 NOTE — Anesthesia Postprocedure Evaluation (Signed)
Anesthesia Post Note  Patient: Derek Wells  Procedure(s) Performed: Procedure(s) (LRB): CARDIOVERSION (N/A)     Patient location during evaluation: PACU Anesthesia Type: General Level of consciousness: awake Pain management: pain level controlled Vital Signs Assessment: post-procedure vital signs reviewed and stable Respiratory status: spontaneous breathing Cardiovascular status: stable Anesthetic complications: no    Last Vitals:  Vitals:   05/13/17 1150 05/13/17 1200  BP: (!) 164/52 (!) 148/52  Pulse: 68 69  Resp: (!) 21 15  Temp:    SpO2: 94% 95%    Last Pain:  Vitals:   05/13/17 1140  TempSrc: Oral                 Caesar Mannella

## 2017-05-13 NOTE — H&P (View-Only) (Signed)
Primary Care Physician: Marden Noble, MD Referring Physician: [facility] cardiac rehab   Derek Wells is a 83 y.o. male with a h/o CAD, s/p bypass in 2014,HTN, remote afib that was seen in the afib clinic, 7/24, for f/u of afib found in cardiac rehab yesterday. He remains in rate controlled afib today but is asymptomatic. He gives a h/o of a drop in h/h while on anticoagulation for afib in 2016 and anticoagulation was stopped. He had an upper endoscopy and colonoscopy without any source of bleeding. He reports that H/H stabilized with addition of iron. He  has a chadsvasc score of at least 4.  Pt believes that onset of afib may have been due to stress from a family situation and having family members in last week and drinking heavier amounts of alcohol. He feels well in rate controlled afuib and did not want any further intervention. He was started on anticoagulation.  F/u in afib clinic 8/1. Still is asymptomatic in afb but has noted heart rate to be running in low 50's/upper 40's at times. He has been back to cardiac rehab and did well exercising. Slightly sluggish when heart rate is low. CBC pending today with h/o prior anemia without bleeding source. No obvious bleeding with start of eliquis.  F/u in afib clinic 8/17 at pt request. He reports that he can not take a good deep breath and he is waking up at night and can not get back to sleep. Fluid status is normal, actually down 2 lbs. He has not noted any bleeding. He remains in afib at 62 bpm. Stat CBC with normal blood count. He feels that the afib may be getting to him and is now willing to have a cardioversion. He had one in the remote past which did keep him in rhythm for some time.  Today, he denies symptoms of palpitations, chest pain, shortness of breath, orthopnea, PND, lower extremity edema, dizziness, presyncope, syncope, or neurologic sequela. The patient is tolerating medications without difficulties and is otherwise without complaint  today.   Past Medical History:  Diagnosis Date  . CAD (coronary artery disease), severe needing CABG 01/19/2013  . Chest pain    2D ECHO, 06/16/2012 - EF 50-55%, borderline low left ventricular systolic function  . Coronary artery disease   . Dyspnea on exertion    LEXISCAN, 06/17/2012 - fixed inferior bowel artifact and basal septal defect, probably related to IVCD/incomplete LBBB, no reversible ischemia, post-stress EF 66%  . History of AAA (abdominal aortic aneurysm) repair    ABDOMINAL AORTIC DOPPLER, 06/16/2013 - normal  . HTN (hypertension) 01/19/2013  . Hyperlipidemia LDL goal < 70 01/19/2013  . Hypertension   . S/P CABG x 3 01/19/2013   Past Surgical History:  Procedure Laterality Date  . CARDIAC CATHETERIZATION  01/15/2013   Severe coronary obstructive disease with 60-70% ostial left main and 90-85% distal LAD stenosis and mild-moderate stenoses in RCA, recommended for CABG  . CARDIOVERSION N/A 11/12/2013   Procedure: CARDIOVERSION;  Surgeon: Lennette Bihari, MD;  Location: [facility] ENDOSCOPY;  Service: Cardiovascular;  Laterality: N/A;  11:16 Lido 60mg , Propofol 120mg ,IV 11:19 synch 100 joules conversion conversion to NSR....  . CORONARY ARTERY BYPASS GRAFT N/A 01/15/2013   Procedure: Coronary Artery Bypass Graft times three using Right Greater Saphenous Vein Graft harvested endoscopically and Left Internal Mammary Artery and;  Surgeon: Alleen Borne, MD;  Location: [facility] OR;  Service: Open Heart Surgery;  Laterality: N/A;  . INTRAOPERATIVE TRANSESOPHAGEAL ECHOCARDIOGRAM  01/15/2013  Procedure: INTRAOPERATIVE TRANSESOPHAGEAL ECHOCARDIOGRAM;  Surgeon: Alleen Borne, MD;  Location: [facility] OR;  Service: Open Heart Surgery;;  . LEFT HEART CATHETERIZATION WITH CORONARY ANGIOGRAM N/A 01/15/2013   Procedure: LEFT HEART CATHETERIZATION WITH CORONARY ANGIOGRAM;  Surgeon: Lennette Bihari, MD;  Location: [facility] CATH LAB;  Service: Cardiovascular;  Laterality: N/A;  . VASCULAR SURGERY      Current Outpatient  Prescriptions  Medication Sig Dispense Refill  . amLODipine (NORVASC) 5 MG tablet Take 1 tablet (5 mg total) by mouth daily. 30 tablet 11  . apixaban (ELIQUIS) 5 MG TABS tablet Take 1 tablet (5 mg total) by mouth 2 (two) times daily. 60 tablet 3  . atorvastatin (LIPITOR) 40 MG tablet Take 1 tablet (40 mg total) by mouth daily. 90 tablet 3  . irbesartan (AVAPRO) 150 MG tablet Take 1 tablet (150 mg total) by mouth daily. 90 tablet 3  . metFORMIN (GLUCOPHAGE) 500 MG tablet Take 500 mg by mouth daily.    . metoprolol succinate (TOPROL-XL) 25 MG 24 hr tablet Take 0.5 tablets (12.5 mg total) by mouth daily. 30 tablet 10  . Multiple Vitamin (MULTIVITAMIN WITH MINERALS) TABS Take 1 tablet by mouth daily.     No current facility-administered medications for this encounter.     Allergies  Allergen Reactions  . Penicillins Rash    Social History   Social History  . Marital status: Married    Spouse name: N/A  . Number of children: N/A  . Years of education: N/A   Occupational History  . Not on file.   Social History Main Topics  . Smoking status: Former Smoker    Types: Cigars    Quit date: 01/16/2013  . Smokeless tobacco: Never Used  . Alcohol use 8.4 oz/week    14 Standard drinks or equivalent per week  . Drug use: Unknown  . Sexual activity: Not on file   Other Topics Concern  . Not on file   Social History Narrative  . No narrative on file    Family History  Problem Relation Age of Onset  . Hyperlipidemia Mother   . Heart disease Mother   . Heart disease Father   . Hypertension Father   . Heart disease Brother     ROS- All systems are reviewed and negative except as per the HPI above  Physical Exam: Vitals:   05/09/17 1116  BP: (!) 126/58  Pulse: 62  SpO2: 94%  Weight: 246 lb 6.4 oz (111.8 kg)  Height: 6\' 1"  (1.854 m)   Wt Readings from Last 3 Encounters:  05/09/17 246 lb 6.4 oz (111.8 kg)  04/23/17 248 lb (112.5 kg)  04/15/17 244 lb (110.7 kg)     Labs: Lab Results  Component Value Date   NA 139 05/09/2017   K 3.9 05/09/2017   CL 108 05/09/2017   CO2 23 05/09/2017   GLUCOSE 134 (H) 05/09/2017   BUN 15 05/09/2017   CREATININE 0.95 05/09/2017   CALCIUM 9.3 05/09/2017   MG 2.0 10/12/2013   Lab Results  Component Value Date   INR 1.89 (H) 11/08/2013   Lab Results  Component Value Date   CHOL 148 03/07/2017   HDL 42 03/07/2017   LDLCALC 86 03/07/2017   TRIG 101 03/07/2017   CBC WNL  GEN- The patient is well appearing, alert and oriented x 3 today.   Head- normocephalic, atraumatic Eyes-  Sclera clear, conjunctiva pink Ears- hearing intact Oropharynx- clear Neck- supple, no JVP Lymph- no cervical  lymphadenopathy Lungs- Clear to ausculation bilaterally, normal work of breathing Heart- irregular rate and rhythm, no murmurs, rubs or gallops, PMI not laterally displaced GI- soft, NT, ND, + BS Extremities- no clubbing, cyanosis, or edema MS- no significant deformity or atrophy Skin- no rash or lesion Psych- euthymic mood, full affect Neuro- strength and sensation are intact  EKG- afib  at 62 bpm, LAD, NSIVB(chronic) Epic records reviewed      Assessment and Plan: 1. Persistent  afib Rate controlled  Initially asymptomatic but now feels that it is making him symptomatic with fragmented sleep, not being to take a full breath CBC WNL, no bleeding issues Has been on DOAC at least  3 weeks, no interruption He is now ready to have a cardioversion as he wanted to avoid a few weeks ago Hold metoprolol  12.5 mg am of cardioversion He does have a chadsvasc score of 4 Continue eliquis 5 mg bid Advised decrease alcohol to no more than 2 drinks a week  Cardioversion scheduled for Tuesday, 8/21 a 9:30a F/u in afib clinic x one week F/u with  Dr. Tresa Endo in October  Lupita Leash C. [facility] [address] [place], [place] [address] [phone]

## 2017-05-14 ENCOUNTER — Encounter (HOSPITAL_COMMUNITY): Payer: Self-pay

## 2017-05-14 ENCOUNTER — Encounter (HOSPITAL_COMMUNITY): Payer: Self-pay | Admitting: Cardiovascular Disease

## 2017-05-16 ENCOUNTER — Encounter (HOSPITAL_COMMUNITY)
Admission: RE | Admit: 2017-05-16 | Discharge: 2017-05-16 | Disposition: A | Discharge status: Home / Self Care | Payer: Self-pay | Source: Ambulatory Visit | Attending: Cardiovascular Disease | Admitting: Cardiovascular Disease

## 2017-05-19 ENCOUNTER — Encounter (HOSPITAL_COMMUNITY): Payer: Self-pay | Admitting: Nurse Practitioner

## 2017-05-19 ENCOUNTER — Ambulatory Visit (HOSPITAL_COMMUNITY)
Admission: RE | Admit: 2017-05-19 | Discharge: 2017-05-19 | Disposition: A | Discharge status: Home / Self Care | Payer: Medicare Other | Source: Ambulatory Visit | Attending: Nurse Practitioner | Admitting: Nurse Practitioner

## 2017-05-19 ENCOUNTER — Encounter (HOSPITAL_COMMUNITY): Payer: Self-pay

## 2017-05-19 ENCOUNTER — Telehealth: Payer: Self-pay | Admitting: Cardiovascular Disease

## 2017-05-19 VITALS — BP 138/60 | HR 69 | Ht 73.0 in | Wt 241.6 lb

## 2017-05-19 DIAGNOSIS — I481 Persistent atrial fibrillation: Secondary | ICD-10-CM

## 2017-05-19 DIAGNOSIS — Z8249 Family history of ischemic heart disease and other diseases of the circulatory system: Secondary | ICD-10-CM | POA: Insufficient documentation

## 2017-05-19 DIAGNOSIS — Z87891 Personal history of nicotine dependence: Secondary | ICD-10-CM | POA: Insufficient documentation

## 2017-05-19 DIAGNOSIS — Z7984 Long term (current) use of oral hypoglycemic drugs: Secondary | ICD-10-CM | POA: Diagnosis not present

## 2017-05-19 DIAGNOSIS — I10 Essential (primary) hypertension: Secondary | ICD-10-CM | POA: Insufficient documentation

## 2017-05-19 DIAGNOSIS — I2510 Atherosclerotic heart disease of native coronary artery without angina pectoris: Secondary | ICD-10-CM | POA: Insufficient documentation

## 2017-05-19 DIAGNOSIS — Z951 Presence of aortocoronary bypass graft: Secondary | ICD-10-CM | POA: Diagnosis not present

## 2017-05-19 DIAGNOSIS — Z9889 Other specified postprocedural states: Secondary | ICD-10-CM | POA: Insufficient documentation

## 2017-05-19 DIAGNOSIS — Z7902 Long term (current) use of antithrombotics/antiplatelets: Secondary | ICD-10-CM | POA: Diagnosis not present

## 2017-05-19 DIAGNOSIS — E785 Hyperlipidemia, unspecified: Secondary | ICD-10-CM | POA: Insufficient documentation

## 2017-05-19 DIAGNOSIS — I4819 Other persistent atrial fibrillation: Secondary | ICD-10-CM

## 2017-05-19 DIAGNOSIS — Z880 Allergy status to penicillin: Secondary | ICD-10-CM | POA: Diagnosis not present

## 2017-05-19 DIAGNOSIS — Z955 Presence of coronary angioplasty implant and graft: Secondary | ICD-10-CM | POA: Insufficient documentation

## 2017-05-19 MED ORDER — METOPROLOL SUCCINATE ER 25 MG PO TB24: 25.0000 mg | ORAL_TABLET | Freq: Every day | ORAL | 10 refills | Status: DC

## 2017-05-19 NOTE — Progress Notes (Signed)
Primary Care Physician: Marden Noble, MD Referring Physician: [facility] cardiac rehab   Derek Wells is a 83 y.o. male with a h/o CAD, s/p bypass in 2014,HTN, remote afib that was seen in the afib clinic, 7/24, for f/u of afib found in cardiac rehab yesterday. He remains in rate controlled afib today but is asymptomatic. He gives a h/o of a drop in h/h while on anticoagulation for afib in 2016 and anticoagulation was stopped. He had an upper endoscopy and colonoscopy without any source of bleeding. He reports that H/H stabilized with addition of iron. He  has a chadsvasc score of at least 4.  Pt believes that onset of afib may have been due to stress from a family situation and having family members in last week and drinking heavier amounts of alcohol. He feels well in rate controlled afuib and did not want any further intervention. He was started on anticoagulation.  F/u in afib clinic 8/1. Still is asymptomatic in afb but has noted heart rate to be running in low 50's/upper 40's at times. He has been back to cardiac rehab and did well exercising. Slightly sluggish when heart rate is low. CBC pending today with h/o prior anemia without bleeding source. No obvious bleeding with start of eliquis.  F/u in afib clinic 8/17 at pt request. He reports that he can not take a good deep breath and he is waking up at night and can not get back to sleep. Fluid status is normal, actually down 2 lbs. He has not noted any bleeding. He remains in afib at 62 bpm. Stat CBC with normal blood count. He feels that the afib may be getting to him and is now willing to have a cardioversion. He had one in the remote past which did keep him in rhythm for some time.  F/u after cardioversion, he had successful DCCV and is holding in SR. He feels better with breathing easier and sleeping better at night. Wife states that he still drinks more alcohol than he should. He can return to cardiac rehab. He has no chest tightness with  exercising at Cardiac rehab but does have some occasional chest tightness with walking with his wife. He says that this is a chronic condition  and that he has discussed with  Dr. Tresa Endo of these symptoms in the past. Last stress test in 10/17 and was low risk.  Today, he denies symptoms of palpitations, chest pain, shortness of breath, orthopnea, PND, lower extremity edema, dizziness, presyncope, syncope, or neurologic sequela. The patient is tolerating medications without difficulties and is otherwise without complaint today.   Past Medical History:  Diagnosis Date  . CAD (coronary artery disease), severe needing CABG 01/19/2013  . Chest pain    2D ECHO, 06/16/2012 - EF 50-55%, borderline low left ventricular systolic function  . Coronary artery disease   . Dyspnea on exertion    LEXISCAN, 06/17/2012 - fixed inferior bowel artifact and basal septal defect, probably related to IVCD/incomplete LBBB, no reversible ischemia, post-stress EF 66%  . History of AAA (abdominal aortic aneurysm) repair    ABDOMINAL AORTIC DOPPLER, 06/16/2013 - normal  . HTN (hypertension) 01/19/2013  . Hyperlipidemia LDL goal < 70 01/19/2013  . Hypertension   . S/P CABG x 3 01/19/2013   Past Surgical History:  Procedure Laterality Date  . CARDIAC CATHETERIZATION  01/15/2013   Severe coronary obstructive disease with 60-70% ostial left main and 90-85% distal LAD stenosis and mild-moderate stenoses in RCA, recommended for  CABG  . CARDIOVERSION N/A 11/12/2013   Procedure: CARDIOVERSION;  Surgeon: Lennette Bihari, MD;  Location: [facility] ENDOSCOPY;  Service: Cardiovascular;  Laterality: N/A;  11:16 Lido 60mg , Propofol 120mg ,IV 11:19 synch 100 joules conversion conversion to NSR....  . CARDIOVERSION N/A 05/13/2017   Procedure: CARDIOVERSION;  Surgeon: Elease Hashimoto Deloris Ping, MD;  Location: [facility] ENDOSCOPY;  Service: Cardiovascular;  Laterality: N/A;  . CORONARY ARTERY BYPASS GRAFT N/A 01/15/2013   Procedure: Coronary Artery Bypass Graft times  three using Right Greater Saphenous Vein Graft harvested endoscopically and Left Internal Mammary Artery and;  Surgeon: Alleen Borne, MD;  Location: [facility] OR;  Service: Open Heart Surgery;  Laterality: N/A;  . INTRAOPERATIVE TRANSESOPHAGEAL ECHOCARDIOGRAM  01/15/2013   Procedure: INTRAOPERATIVE TRANSESOPHAGEAL ECHOCARDIOGRAM;  Surgeon: Alleen Borne, MD;  Location: [facility] OR;  Service: Open Heart Surgery;;  . LEFT HEART CATHETERIZATION WITH CORONARY ANGIOGRAM N/A 01/15/2013   Procedure: LEFT HEART CATHETERIZATION WITH CORONARY ANGIOGRAM;  Surgeon: Lennette Bihari, MD;  Location: [facility] CATH LAB;  Service: Cardiovascular;  Laterality: N/A;  . VASCULAR SURGERY      Current Outpatient Prescriptions  Medication Sig Dispense Refill  . amLODipine (NORVASC) 5 MG tablet TAKE 1 TABLET BY MOUTH ONCE DAILY 90 tablet 2  . apixaban (ELIQUIS) 5 MG TABS tablet Take 1 tablet (5 mg total) by mouth 2 (two) times daily. 60 tablet 3  . irbesartan (AVAPRO) 150 MG tablet Take 1 tablet (150 mg total) by mouth daily. 90 tablet 3  . metFORMIN (GLUCOPHAGE) 500 MG tablet Take 500 mg by mouth daily.    . metoprolol succinate (TOPROL-XL) 25 MG 24 hr tablet Take 1 tablet (25 mg total) by mouth daily. 30 tablet 10  . Multiple Vitamin (MULTIVITAMIN WITH MINERALS) TABS Take 1 tablet by mouth daily.    Marland Kitchen atorvastatin (LIPITOR) 40 MG tablet Take 1 tablet (40 mg total) by mouth daily. 90 tablet 3   No current facility-administered medications for this encounter.     Allergies  Allergen Reactions  . Penicillins Rash    Social History   Social History  . Marital status: Married    Spouse name: N/A  . Number of children: N/A  . Years of education: N/A   Occupational History  . Not on file.   Social History Main Topics  . Smoking status: Former Smoker    Types: Cigars    Quit date: 01/16/2013  . Smokeless tobacco: Never Used  . Alcohol use 8.4 oz/week    14 Standard drinks or equivalent per week  . Drug use: No  . Sexual  activity: Not on file   Other Topics Concern  . Not on file   Social History Narrative  . No narrative on file    Family History  Problem Relation Age of Onset  . Hyperlipidemia Mother   . Heart disease Mother   . Heart disease Father   . Hypertension Father   . Heart disease Brother     ROS- All systems are reviewed and negative except as per the HPI above  Physical Exam: Vitals:   05/19/17 1341  BP: 138/60  Pulse: 69  Weight: 241 lb 9.6 oz (109.6 kg)  Height: 6\' 1"  (1.854 m)   Wt Readings from Last 3 Encounters:  05/19/17 241 lb 9.6 oz (109.6 kg)  05/13/17 245 lb (111.1 kg)  05/09/17 246 lb 6.4 oz (111.8 kg)    Labs: Lab Results  Component Value Date   NA 139 05/09/2017   K 3.9  05/09/2017   CL 108 05/09/2017   CO2 23 05/09/2017   GLUCOSE 134 (H) 05/09/2017   BUN 15 05/09/2017   CREATININE 0.95 05/09/2017   CALCIUM 9.3 05/09/2017   MG 2.0 10/12/2013   Lab Results  Component Value Date   INR 1.89 (H) 11/08/2013   Lab Results  Component Value Date   CHOL 148 03/07/2017   HDL 42 03/07/2017   LDLCALC 86 03/07/2017   TRIG 101 03/07/2017   CBC WNL  GEN- The patient is well appearing, alert and oriented x 3 today.   Head- normocephalic, atraumatic Eyes-  Sclera clear, conjunctiva pink Ears- hearing intact Oropharynx- clear Neck- supple, no JVP Lymph- no cervical lymphadenopathy Lungs- Clear to ausculation bilaterally, normal work of breathing Heart-regular rate and rhythm, no murmurs, rubs or gallops, PMI not laterally displaced GI- soft, NT, ND, + BS Extremities- no clubbing, cyanosis, or edema MS- no significant deformity or atrophy Skin- no rash or lesion Psych- euthymic mood, full affect Neuro- strength and sensation are intact  EKG- SR with first degree AV block Pr int 308 ms, qrs int 130 ms, qtc 428 ms Epic records reviewed    Assessment and Plan: 1. Persistent  afib Initially asymptomatic but then later  symptomatic with fragmented  sleep, not being to take a full breath Had successful cardioversion 8/21 Feels improved in SR Continue metoprolol at 25 mg qd He does have a chadsvasc score of 4 Continue eliquis 5 mg bid Advised decrease alcohol to no more than 2 drinks a week  Can return to cardiac rehab F/u in afib clinic as needed F/u with  Dr. Tresa Endo in October as scheduled  Elvina Sidle. [facility] [address] [place], [place] [address] [phone]

## 2017-05-19 NOTE — Telephone Encounter (Signed)
Afib clinic appt today-will await EKG results.

## 2017-05-19 NOTE — Telephone Encounter (Signed)
Derek Wells is calling because  Cardiac Rehab is saying that until they get a release from the doctor he cannot exercise ,he is wanting to know can he get a release sent to them .  He had a cardioversion done on Tuesday  05/13/17. Please call   Thanks

## 2017-05-19 NOTE — Patient Instructions (Signed)
Your physician has recommended you make the following change in your medication:  1)Resume metoprolol at 25mg  a day  May return to cardiac rehab

## 2017-05-19 NOTE — Telephone Encounter (Signed)
Per Rudi Coco NP patient may return to cardiac rehab. Pt is aware of this and I have also called cardiac rehab to notify.

## 2017-05-19 NOTE — Telephone Encounter (Signed)
Pt apparently had dc cardioversion on 8/21 for recurrent AF and is to be seen in AF clinic this week; should get appointment early this week and if maintaining NSR then ok to resume exercise and rehab.

## 2017-05-21 ENCOUNTER — Encounter (HOSPITAL_COMMUNITY)
Admission: RE | Admit: 2017-05-21 | Discharge: 2017-05-21 | Disposition: A | Discharge status: Home / Self Care | Payer: Self-pay | Source: Ambulatory Visit | Attending: Cardiovascular Disease | Admitting: Cardiovascular Disease

## 2017-05-23 ENCOUNTER — Encounter (HOSPITAL_COMMUNITY): Payer: Self-pay

## 2017-05-28 ENCOUNTER — Encounter (HOSPITAL_COMMUNITY): Payer: Self-pay

## 2017-05-28 DIAGNOSIS — I2510 Atherosclerotic heart disease of native coronary artery without angina pectoris: Secondary | ICD-10-CM | POA: Insufficient documentation

## 2017-05-30 ENCOUNTER — Encounter (HOSPITAL_COMMUNITY)
Admission: RE | Admit: 2017-05-30 | Discharge: 2017-05-30 | Disposition: A | Discharge status: Home / Self Care | Payer: Self-pay | Source: Ambulatory Visit | Attending: Cardiovascular Disease | Admitting: Cardiovascular Disease

## 2017-06-02 ENCOUNTER — Encounter (HOSPITAL_COMMUNITY)
Admission: RE | Admit: 2017-06-02 | Discharge: 2017-06-02 | Disposition: A | Discharge status: Home / Self Care | Payer: Self-pay | Source: Ambulatory Visit | Attending: Cardiovascular Disease | Admitting: Cardiovascular Disease

## 2017-06-04 ENCOUNTER — Encounter (HOSPITAL_COMMUNITY): Payer: Self-pay

## 2017-06-06 ENCOUNTER — Encounter (HOSPITAL_COMMUNITY): Payer: Self-pay

## 2017-06-09 ENCOUNTER — Encounter (HOSPITAL_COMMUNITY): Payer: Self-pay

## 2017-06-11 ENCOUNTER — Encounter (HOSPITAL_COMMUNITY)
Admission: RE | Admit: 2017-06-11 | Discharge: 2017-06-11 | Disposition: A | Discharge status: Home / Self Care | Payer: Self-pay | Source: Ambulatory Visit | Attending: Cardiovascular Disease | Admitting: Cardiovascular Disease

## 2017-06-13 ENCOUNTER — Encounter (HOSPITAL_COMMUNITY)
Admission: RE | Admit: 2017-06-13 | Discharge: 2017-06-13 | Disposition: A | Discharge status: Home / Self Care | Payer: Self-pay | Source: Ambulatory Visit | Attending: Cardiovascular Disease | Admitting: Cardiovascular Disease

## 2017-06-16 ENCOUNTER — Encounter (HOSPITAL_COMMUNITY)
Admission: RE | Admit: 2017-06-16 | Discharge: 2017-06-16 | Disposition: A | Discharge status: Home / Self Care | Payer: Self-pay | Source: Ambulatory Visit | Attending: Cardiovascular Disease | Admitting: Cardiovascular Disease

## 2017-06-18 ENCOUNTER — Encounter (HOSPITAL_COMMUNITY)
Admission: RE | Admit: 2017-06-18 | Discharge: 2017-06-18 | Disposition: A | Discharge status: Home / Self Care | Payer: Self-pay | Source: Ambulatory Visit | Attending: Cardiovascular Disease | Admitting: Cardiovascular Disease

## 2017-06-20 ENCOUNTER — Encounter (HOSPITAL_COMMUNITY): Payer: Self-pay

## 2017-06-23 ENCOUNTER — Encounter (HOSPITAL_COMMUNITY)
Admission: RE | Admit: 2017-06-23 | Discharge: 2017-06-23 | Disposition: A | Discharge status: Home / Self Care | Payer: Self-pay | Source: Ambulatory Visit | Attending: Cardiovascular Disease | Admitting: Cardiovascular Disease

## 2017-06-23 DIAGNOSIS — I2510 Atherosclerotic heart disease of native coronary artery without angina pectoris: Secondary | ICD-10-CM | POA: Insufficient documentation

## 2017-06-25 ENCOUNTER — Encounter (HOSPITAL_COMMUNITY): Payer: Self-pay

## 2017-06-27 ENCOUNTER — Encounter (HOSPITAL_COMMUNITY)
Admission: RE | Admit: 2017-06-27 | Discharge: 2017-06-27 | Disposition: A | Discharge status: Home / Self Care | Payer: Self-pay | Source: Ambulatory Visit | Attending: Cardiovascular Disease | Admitting: Cardiovascular Disease

## 2017-06-30 ENCOUNTER — Encounter (HOSPITAL_COMMUNITY)
Admission: RE | Admit: 2017-06-30 | Discharge: 2017-06-30 | Disposition: A | Discharge status: Home / Self Care | Payer: Self-pay | Source: Ambulatory Visit | Attending: Cardiovascular Disease | Admitting: Cardiovascular Disease

## 2017-07-02 ENCOUNTER — Encounter (HOSPITAL_COMMUNITY)
Admission: RE | Admit: 2017-07-02 | Discharge: 2017-07-02 | Disposition: A | Discharge status: Home / Self Care | Payer: Self-pay | Source: Ambulatory Visit | Attending: Cardiovascular Disease | Admitting: Cardiovascular Disease

## 2017-07-03 ENCOUNTER — Encounter: Payer: Self-pay | Admitting: Cardiovascular Disease

## 2017-07-03 ENCOUNTER — Ambulatory Visit (INDEPENDENT_AMBULATORY_CARE_PROVIDER_SITE_OTHER): Payer: Medicare Other | Admitting: Cardiovascular Disease

## 2017-07-03 VITALS — BP 130/62 | HR 68 | Ht 73.5 in | Wt 241.6 lb

## 2017-07-03 DIAGNOSIS — Z7901 Long term (current) use of anticoagulants: Secondary | ICD-10-CM

## 2017-07-03 DIAGNOSIS — I2510 Atherosclerotic heart disease of native coronary artery without angina pectoris: Secondary | ICD-10-CM

## 2017-07-03 DIAGNOSIS — D509 Iron deficiency anemia, unspecified: Secondary | ICD-10-CM | POA: Diagnosis not present

## 2017-07-03 DIAGNOSIS — E785 Hyperlipidemia, unspecified: Secondary | ICD-10-CM

## 2017-07-03 DIAGNOSIS — Z8679 Personal history of other diseases of the circulatory system: Secondary | ICD-10-CM

## 2017-07-03 DIAGNOSIS — R011 Cardiac murmur, unspecified: Secondary | ICD-10-CM

## 2017-07-03 DIAGNOSIS — Z79899 Other long term (current) drug therapy: Secondary | ICD-10-CM | POA: Diagnosis not present

## 2017-07-03 DIAGNOSIS — I4892 Unspecified atrial flutter: Secondary | ICD-10-CM | POA: Diagnosis not present

## 2017-07-03 DIAGNOSIS — Z9889 Other specified postprocedural states: Secondary | ICD-10-CM | POA: Diagnosis not present

## 2017-07-03 DIAGNOSIS — I10 Essential (primary) hypertension: Secondary | ICD-10-CM | POA: Diagnosis not present

## 2017-07-03 DIAGNOSIS — Z951 Presence of aortocoronary bypass graft: Secondary | ICD-10-CM

## 2017-07-03 NOTE — Progress Notes (Signed)
Patient ID: Derek Wells, male   DOB: 02-02-1933, 83 y.o.   MRN: [id number]     HPI: Derek Wells is a 83 y.o. male who presents to the office today for a 9 month followup cardiology evaluation   Derek Wells has a history of hypertension, hyperlipidemia, and is s/p abdominal aortic aneurysm repair in April 2010. I had seen him on 01/14/2013 at which time he had experienced symptoms worrisome for unstable angina. Previously, in September 2013 he had undergone a nuclear perfusion study  which was essentially normal although did show mild diaphragmatic attenuation. He also been found to have a atherogenic dyslipidemia pattern with reference to his lipid panel. Catheterization the following day on 01/15/2013 revealed life-threatening coronary anatomy with 60-70% ostial left main stenosis, 90-95% distal left main stenosis, 95-99% ostial LAD stenosis, and mild/moderate stenoses in the RCA. That same day he was successfully operated by Dr. Arvid Right and underwent CABG x3 with a LIMA to the LAD, SVG to the OM, and SVG to the PDA. He had a unremarkable postoperative course and was discharged 4 days later.  He saw [name] on 01/27/13 and was doing well. I saw him in followup on 03/02/2013 and he  participating  in cardiac rehabilitation. When I saw him in September 2014, he was in sinus rhythm and has and had resumed a good level of activity.   In January 2015, he was found to be in atrial flutter at cardiac rehabilitation. He was unaware of his heart rhythm being abnormal. He had developed a URI over the holidays prior to that and had just completed a steroid dose pack. Xarelto 20 mg was added to his medical regimen. He did undergo subsequent blood work on January 20 which showed normal renal function and normal electrolytes. Liver function studies were normal. Hemoglobin was 13.7 hematocrit 41.7. Platelets were normal. TSH was normal at 2.63. Magnesium was normal at 2.0.  He underwent successful cardioversion for  his atrial flutter on 11/12/2013 and he cardioverted to sinus rhythm at 100 J.  Since his cardioversion, he is unaware of any recurrent rhythm disturbance.   He is in the maintenance phase of cardiac rehabilitation.  When I  saw him in June 2015, he told me that he had noticed several episodes of a vague chest sensation particularly when he rolls his garbage can back up a hill at home.    On 03/18/2014, he underwent a nuclear perfusion study, which showed reduced exercise capacity with a workload of only 5.9.  He did experience mild shortness of breath.  There was no significant ST depression, but he had developed transient left bundle branch block.  The scan was interpreted at low as low risk.  Ejection fraction was 65% without wall motion abnormalities.  An echo Doppler study showed an EF of 60-65%.  There were no regional wall motion abnormalities.  He had mild biatrial enlargement.  Derek Wells currently is playing golf at least 2 times per week and occasionally 3.  He is in the maintenance phase of cardiac rehabilitation.  He does admit to some weight gain.  He denies any episodes of chest pain, particularly during the cardiac rehabilitation program.  He has noticed some mild shortness of breath with deep breathing and some vague pressure in his chest which is short-lived walking up hills when he walks fast with his wife.  He has noticed some mild shortness of breath walking up steep hills in the golf course without chest pressure.  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 2016.  When I saw him in 2017 he was anemic and had significantly reduced iron saturation at 6%, and his serum ferritin was 7.  I referred him for GI evaluation and underwent both colonoscopy and endoscopy by Dr. [name] and 4 polyps were removed from his colon.  He was not having any active bleeding.  He does note improved energy.  He had taken Niferex initially and transitioned to over-the-counter iron. He  has more energy.  He is continuing with cardiac rehabilitation.  He underwent a follow-up abdominal ultrasound on 06/09/2015 which showed a patent aortobifemoral bypass graft without focal dilatation or stenosis.   In the late summer of 2017 he was at the beach for several weeks and played golf heat without chest pain.  However with fast walking he noted chest pressure.  For the past month he has had a cough.  He has a ventral hernia.  When I saw him, his ECG showed progressive PR prolongation.  I reduced his Lopressor back to 25 mg daily and added amlodipine 5 mg blood pressure and his chest tightness.  I scheduled him for a nuclear perfusion study which was done on 07/23/2016.  Ejection fraction was 62%.  There were no ECG changes ischemia, but there was a rare PVC.  He had normal perfusion without scar or ischemia.  His follow-up laboratory showed significant improvement with his iron saturation, now at 27%.  Lipid studies revealed a total cholesterol 164, triglycerides 122, HDL 40, and LDL 100.    When I last saw him in January 2018, I suggested the addition of Zetia to his atorvastatin.  In the past.  He did not tolerate 80 mg, atorvastatin, and also had myalgias on 40 mg.  I also discussed plaque regression studies with Repatha.  Since I saw him, he had developed recurrent atrial fibrillation and had multiple evaluations in the A. fib clinic commencing in July 2018.  Ultimately, on 05/13/2017, he underwent successful cardioversion with restoration of sinus rhythm.  He saw [name] in follow-up of his cardioversion and was maintaining sinus rhythm.  There was discussion concerning his EtOH use and reduction in consumption.  He typically has at least 1 or more vodka every day.  He has resumed playing golf.  He denies chest pain or palpitations.  He is continuing to participate in the maintenance phase of cardiac rehabilitation.  He presents for evaluation.   Past Medical History:  Diagnosis Date    . CAD (coronary artery disease), severe needing CABG 01/19/2013  . Chest pain    2D ECHO, 06/16/2012 - EF 50-55%, borderline low left ventricular systolic function  . Coronary artery disease   . Dyspnea on exertion    LEXISCAN, 06/17/2012 - fixed inferior bowel artifact and basal septal defect, probably related to IVCD/incomplete LBBB, no reversible ischemia, post-stress EF 66%  . History of AAA (abdominal aortic aneurysm) repair    ABDOMINAL AORTIC DOPPLER, 06/16/2013 - normal  . HTN (hypertension) 01/19/2013  . Hyperlipidemia LDL goal < 70 01/19/2013  . Hypertension   . S/P CABG x 3 01/19/2013    Past Surgical History:  Procedure Laterality Date  . CARDIAC CATHETERIZATION  01/15/2013   Severe coronary obstructive disease with 60-70% ostial left main and 90-85% distal LAD stenosis and mild-moderate stenoses in RCA, recommended for CABG  . CARDIOVERSION N/A 11/12/2013   Procedure: CARDIOVERSION;  Surgeon: Troy Sine, MD;  Location:  [facility];  Service: Cardiovascular;  Laterality: N/A;  11:16 Lido 87m, Propofol 1280mIV 11:19 synch 100 joules conversion conversion to NSR....  . CARDIOVERSION N/A 05/13/2017   Procedure: CARDIOVERSION;  Surgeon: NaAcie FredricksonhWonda ChengMD;  [facility];  Service: Cardiovascular;  Laterality: N/A;  . CORONARY ARTERY BYPASS GRAFT N/A 01/15/2013   Procedure: Coronary Artery Bypass Graft times three using Right Greater Saphenous Vein Graft harvested endoscopically and Left Internal Mammary Artery and;  Surgeon: BrGaye PollackMD;  [facility];  Service: Open Heart Surgery;  Laterality: N/A;  . INTRAOPERATIVE TRANSESOPHAGEAL ECHOCARDIOGRAM  01/15/2013   Procedure: INTRAOPERATIVE TRANSESOPHAGEAL ECHOCARDIOGRAM;  Surgeon: BrGaye PollackMD;  [facility];  Service: Open Heart Surgery;;  . LEFT HEART CATHETERIZATION WITH CORONARY ANGIOGRAM N/A 01/15/2013   Procedure: LEFT HEART CATHETERIZATION WITH CORONARY ANGIOGRAM;  Surgeon: ThTroy SineMD;   [facility];  Service: Cardiovascular;  Laterality: N/A;  . VASCULAR SURGERY      Allergies  Allergen Reactions  . Penicillins Rash    Current Outpatient Prescriptions  Medication Sig Dispense Refill  . amLODipine (NORVASC) 5 MG tablet TAKE 1 TABLET BY MOUTH ONCE DAILY 90 tablet 2  . apixaban (ELIQUIS) 5 MG TABS tablet Take 1 tablet (5 mg total) by mouth 2 (two) times daily. 60 tablet 3  . atorvastatin (LIPITOR) 40 MG tablet Take 40 mg by mouth daily.    . irbesartan (AVAPRO) 150 MG tablet Take 1 tablet (150 mg total) by mouth daily. 90 tablet 3  . metFORMIN (GLUCOPHAGE) 500 MG tablet Take 500 mg by mouth daily.    . metoprolol succinate (TOPROL-XL) 25 MG 24 hr tablet Take by mouth daily. PT TAKES 12.5MG DAILY    . Multiple Vitamin (MULTIVITAMIN WITH MINERALS) TABS Take 1 tablet by mouth daily.     No current facility-administered medications for this visit.     Socially he is re-married. His first wife of 42 years passed away. He previously had smoked cigars prior to his bypass surgery but has not had any tobacco use since.  He continues to play golf at least 2 times per week.  He does a lot of leg exercise with cardiac rehabilitation.  ROS General: Negative; No fevers, chills, or night sweats;  HEENT: Negative; No changes in vision or hearing, sinus congestion, difficulty swallowing Pulmonary: URI symptoms during the winter; No cough, wheezing, shortness of breath, hemoptysis Cardiovascular: Mild shortness of breath with uphill walking; see history of present illness GI: Negative; No nausea, vomiting, diarrhea, or abdominal pain GU: Negative; No dysuria, hematuria, or difficulty voiding Musculoskeletal: Negative; no myalgias, joint pain, or weakness Hematologic/Oncology: Negative; no easy bruising, bleeding Endocrine: Negative; no heat/cold intolerance; no diabetes Neuro: Negative; no changes in balance, headaches Skin: Removal of small left forearm  melanoma. Psychiatric: Negative; No behavioral problems, depression Sleep: Negative; No snoring, daytime sleepiness, hypersomnolence, bruxism, restless legs, hypnogognic hallucinations, no cataplexy Other comprehensive 14 point system review is negative.   PE BP 130/62   Pulse 68   Ht 6' 1.5" (1.867 m)   Wt 241 lb 9.6 oz (109.6 kg)   BMI 31.44 kg/m    Repeat blood pressure by me was 130/68.  Wt Readings from Last 3 Encounters:  07/03/17 241 lb 9.6 oz (109.6 kg)  05/19/17 241 lb 9.6 oz (109.6 kg)  05/13/17 245 lb (111.1 kg)   General: Alert, oriented, no distress.  Skin: normal turgor, no rashes, warm and dry HEENT: Normocephalic, atraumatic. Pupils equal  round and reactive to light; sclera anicteric; extraocular muscles intact; Fundi ** Nose without nasal septal hypertrophy Mouth/Parynx benign; Mallinpatti scale Neck: No JVD, no carotid bruits; normal carotid upstroke Lungs: clear to ausculatation and percussion; no wheezing or rales Chest wall: without tenderness to palpitation Heart: PMI not displaced, RRR, s1 s2 normal, 2/6 systolic murmur, no diastolic murmur, no rubs, gallops, thrills, or heaves Abdomen: soft, nontender; no hepatosplenomehaly, BS+; abdominal aorta nontender and not dilated by palpation. Back: no CVA tenderness Pulses 2+ Musculoskeletal: full range of motion, normal strength, no joint deformities Extremities: no clubbing cyanosis or edema, Homan's sign negative  Neurologic: grossly nonfocal; Cranial nerves grossly wnl Psychologic: Normal mood and affect   ECG (independently read by me): Sinus rhythm with first-degree AV block with a PR interval of 314 ms.  Nonspecific interventricular block.  QTc interval 431  January 2018 ECG (independently read by me): Normal sinus rhythm with first-degree AV block.  PR interval 300 ms.  Nonspecific interventricular conduction delay.  October 2017 ECG (independently read by me): Normal sinus rhythm at 66 bpm.   First-degree block with a PR interval at 290 ms.  Nonspecific interventricular block.  03/02/2015 ECG (independently read by me): Sinus rhythm at 75 bpm.  Occasional unifocal PVCs.  Nonspecific interventricular block.  Prior October 2015 ECG (independently read by me): Normal sinus rhythm at 67 beats per minute.  First degree A-V block with PR interval at 290 ms.  QTc interval 439 ms.  No ST segment changes.  Prior June 2015 ECG (independently read by me): Normal sinus rhythm at 68 beats per minute with first degree AV block with a PR interval at 284 ms.  QTc interval 435 ms.  No significant ST-T changes.  T-wave inversion in aVL  ECG today (independently read by me):  Normal sinus rhythm at 71 beats per minute. First create a block with PR interval at 240 ms. QTc interval normal at 4-5 ms.  Prior 10/27/13 ECG (independently read by me): Atrial flutter with variable rate but predominantly 4-1 with occasional 3-1 block; average heart rate 70 beats per minute  Prior ECG from 06/04/2013: Sinus rhythm with first-degree AV block. Nonspecific interventricular block  LABS: BMP Latest Ref Rng & Units 05/09/2017 04/15/2017 07/19/2016  Glucose 65 - 99 mg/dL 134(H) 137(H) 115(H)  BUN 6 - 20 mg/dL 15 21(H) 19  Creatinine 0.61 - 1.24 mg/dL 0.95 1.10 0.90  Sodium 135 - 145 mmol/L 139 138 140  Potassium 3.5 - 5.1 mmol/L 3.9 5.2(H) 4.8  Chloride 101 - 111 mmol/L 108 104 103  CO2 22 - 32 mmol/L _0 Calcium 8.9 - 10.3 mg/dL 9.3 9.8 9.3   Hepatic Function Latest Ref Rng & Units 03/07/2017 07/19/2016 03/01/2015  Total Protein 6.0 - 8.5 g/dL 6.7 6.6 6.5  Albumin 3.5 - 4.7 g/dL 4.3 4.2 3.9  AST 0 - 40 IU/L _1 ALT 0 - 44 IU/L _2 Alk Phosphatase 39 - 117 IU/L 64 52 56  Total Bilirubin 0.0 - 1.2 mg/dL 0.9 0.9 0.7  Bilirubin, Direct 0.00 - 0.40 mg/dL 0.22 - -   CBC Latest Ref Rng & Units 05/09/2017 04/23/2017 04/15/2017  WBC 4.0 - 10.5 K/uL 8.6 8.5 9.6  Hemoglobin 13.0 - 17.0 g/dL 13.9 14.0 15.8   Hematocrit 39.0 - 52.0 % 42.0 41.6 45.6  Platelets 150 - 400 K/uL 261 239 241   Lab Results  Component Value Date   MCV 90.3 05/09/2017  MCV 90.0 04/23/2017   MCV 90.3 04/15/2017   Lab Results  Component Value Date   TSH 2.810 04/15/2017   Lipid Panel     Component Value Date/Time   CHOL 148 03/07/2017 1010   CHOL 111 03/01/2015 1033   TRIG 101 03/07/2017 1010   TRIG 90 03/01/2015 1033   HDL 42 03/07/2017 1010   HDL 41 03/01/2015 1033   CHOLHDL 3.5 03/07/2017 1010   CHOLHDL 4.1 07/19/2016 0949   VLDL 24 07/19/2016 0949   LDLCALC 86 03/07/2017 1010   LDLCALC 52 03/01/2015 1033     RADIOLOGY: No results found.  IMPRESSION:  1. S/P AAA repair   2. Coronary artery disease involving native coronary artery of native heart without angina pectoris   3. S/P CABG x 3, LIMA-LAD, VG-OM, VG-PDA 01/15/13    4. Systolic murmur   5. Paroxysmal atrial flutter (HCC)   6. Anticoagulation adequate   7. Essential hypertension   8. Hyperlipidemia with target LDL less than 70   9. Medication management   10. Iron deficiency anemia, unspecified iron deficiency anemia type     ASSESSMENT AND PLAN: Mr. Tritschler is an active 83 year old white male who is status post emergent CABG revascularization surgery after cardiac catheterization on 01/15/2013 demonstrated severe life threatening coronary anatomy.   He  is in the maintenance phase of cardiac rehabilitation. He  developed atrial flutter in January 2015 and was started on anticoagulation with Xarelto and underwent cardioversion.  When I last saw him, he was maintaining sinus  with first degree AV block.  Last year he was found  to be anemic and had significant iron deficiency at 6% with a very low ferritin at 7.  His Xarelto was discontinued and he was started on aspirin aspirin 81 mg.  He underwent complete GI evaluation by Dr. Oletta Lamas and has been on iron replacement.  He is not having any recent angina symptoms and his last nuclear perfusion  study from 07/23/2016 was low risk with normal perfusion without scar or ischemia.  This past summer he developed recurrent atrial fibrillation and was seen at least 3 times in the A. fib clinic and ultimately underwent repeat cardioversion on 05/13/2017.  He is maintaining sinus rhythm today and is now on eliquis 5 mg twice a day for anticoagulation which he is tolerating without bleeding.  He continues to be on Toprol-XL 12.5 mg since he had developed bradycardia on higher dosing.  He also is on irbesartan 150 mg and amlodipine 5 mg.  Blood pressure today is stable.  In the past.  He had difficulty tolerating higher doses of atorvastatin.  He has now been on 40 mg.  I again a long discussion with him concerning outcome data, and potential for plaque regression with more aggressive lipid therapy.  His last laboratory had shown an LDL of 86 taken 3 months ago.  I will repeat fasting lipid panel, chemistry and CBC.  I have strongly recommended the addition of either Zetia or considering Repatha for very aggressive lipid-lowering and plaque regression.  His cardiac murmur is slightly more pronounced and I'm rechecking an echo Doppler study to assess aortic stenosis.  I will also recheck an abdominal aortic ultrasound as this has not been done in several years in follow-up of his AAA surgery.  I will see him in 4 months for reevaluation. Time spent: 25 minutes Troy Sine, MD, [facility]  07/05/2017 3:34 PM

## 2017-07-03 NOTE — Patient Instructions (Signed)
Medication Instructions:  Your physician recommends that you continue on your current medications as directed. Please refer to the Current Medication list given to you today.  Labwork: Please return for FASTING labs  (CMET, CBC, Lipid)  Our in office lab hours are Monday-Friday 8:00-4:30, closed for lunch 1-2 pm.  No appointment needed.  Testing/Procedures: Your physician has requested that you have an echocardiogram. Echocardiography is a painless test that uses sound waves to create images of your heart. It provides your doctor with information about the size and shape of your heart and how well your heart's chambers and valves are working. This procedure takes approximately one hour. There are no restrictions for this procedure. This will be done at our [facility] location:  [address]  Your physician has requested that you have an abdominal aorta duplex. During this test, an ultrasound is used to evaluate the aorta. Allow 30 minutes for this exam. Do not eat after midnight the day before and avoid carbonated beverages  Follow-Up: Your physician recommends that you schedule a follow-up appointment in: 4 MONTHS with Dr. Tresa Endo.   Any Other Special Instructions Will Be Listed Below (If Applicable).     If you need a refill on your cardiac medications before your next appointment, please call your pharmacy.

## 2017-07-04 ENCOUNTER — Encounter (HOSPITAL_COMMUNITY): Payer: Self-pay

## 2017-07-07 ENCOUNTER — Encounter (HOSPITAL_COMMUNITY): Payer: Self-pay

## 2017-07-09 ENCOUNTER — Encounter (HOSPITAL_COMMUNITY): Payer: Self-pay

## 2017-07-11 ENCOUNTER — Encounter (HOSPITAL_COMMUNITY): Payer: Self-pay

## 2017-07-14 ENCOUNTER — Encounter (HOSPITAL_COMMUNITY): Payer: Self-pay

## 2017-07-16 ENCOUNTER — Encounter (HOSPITAL_COMMUNITY)
Admission: RE | Admit: 2017-07-16 | Discharge: 2017-07-16 | Disposition: A | Discharge status: Home / Self Care | Payer: Medicare Other | Source: Ambulatory Visit | Attending: Cardiovascular Disease | Admitting: Cardiovascular Disease

## 2017-07-17 ENCOUNTER — Other Ambulatory Visit: Payer: Self-pay

## 2017-07-17 ENCOUNTER — Ambulatory Visit (HOSPITAL_COMMUNITY): Payer: Medicare Other | Attending: Cardiovascular Disease

## 2017-07-17 DIAGNOSIS — I10 Essential (primary) hypertension: Secondary | ICD-10-CM | POA: Diagnosis not present

## 2017-07-17 DIAGNOSIS — E785 Hyperlipidemia, unspecified: Secondary | ICD-10-CM | POA: Diagnosis not present

## 2017-07-17 DIAGNOSIS — R011 Cardiac murmur, unspecified: Secondary | ICD-10-CM | POA: Diagnosis not present

## 2017-07-17 DIAGNOSIS — I350 Nonrheumatic aortic (valve) stenosis: Secondary | ICD-10-CM | POA: Diagnosis not present

## 2017-07-17 DIAGNOSIS — R0600 Dyspnea, unspecified: Secondary | ICD-10-CM | POA: Diagnosis not present

## 2017-07-17 LAB — ECHOCARDIOGRAM COMPLETE
AOASC: 33 cm
AV Mean grad: 14 mmHg
AV Peak grad: 25 mmHg
AV peak Index: 0.79
AVAREAMEANV: 1.84 cm2
AVAREAMEANVIN: 0.79 cm2/m2
AVAREAVTI: 1.86 cm2
AVAREAVTIIND: 0.77 cm2/m2
AVCELMEANRAT: 0.53
AVLVOTPG: 7 mmHg
AVPKVEL: 248 cm/s
Ao pk vel: 0.54 m/s
CHL CUP AV VEL: 1.81
DOP CAL AO MEAN VELOCITY: 173 cm/s
EERAT: 10.4
EWDT: 229 ms
FS: 42 % (ref 28–44)
IVS/LV PW RATIO, ED: 1
LA diam end sys: 45 mm
LA diam index: 1.92 cm/m2
LA vol A4C: 57.2 ml
LASIZE: 45 mm
LAVOL: 60.5 mL
LAVOLIN: 25.9 mL/m2
LDCA: 3.46 cm2
LV E/e' medial: 10.4
LV E/e'average: 10.4
LV PW d: 9.21 mm — AB (ref 0.6–1.1)
LV TDI E'LATERAL: 10
LV TDI E'MEDIAL: 7.4
LVELAT: 10 cm/s
LVOT SV: 111 mL
LVOT VTI: 32.1 cm
LVOTD: 21 mm
LVOTPV: 133 cm/s
LVOTVTI: 0.52 cm
MV Dec: 229
MVPG: 4 mmHg
MVPKAVEL: 88.3 m/s
MVPKEVEL: 104 m/s
RV LATERAL S' VELOCITY: 13.4 cm/s
TAPSE: 17.3 mm
VTI: 61.4 cm
Valve area index: 0.77
Valve area: 1.81 cm2

## 2017-07-18 ENCOUNTER — Encounter (HOSPITAL_COMMUNITY): Payer: Self-pay

## 2017-07-21 ENCOUNTER — Encounter (HOSPITAL_COMMUNITY)
Admission: RE | Admit: 2017-07-21 | Discharge: 2017-07-21 | Disposition: A | Discharge status: Home / Self Care | Payer: Self-pay | Source: Ambulatory Visit | Attending: Cardiovascular Disease | Admitting: Cardiovascular Disease

## 2017-07-21 DIAGNOSIS — Z9889 Other specified postprocedural states: Secondary | ICD-10-CM | POA: Diagnosis not present

## 2017-07-21 DIAGNOSIS — Z8679 Personal history of other diseases of the circulatory system: Secondary | ICD-10-CM | POA: Diagnosis not present

## 2017-07-21 DIAGNOSIS — I10 Essential (primary) hypertension: Secondary | ICD-10-CM | POA: Diagnosis not present

## 2017-07-21 DIAGNOSIS — R011 Cardiac murmur, unspecified: Secondary | ICD-10-CM | POA: Diagnosis not present

## 2017-07-21 DIAGNOSIS — Z23 Encounter for immunization: Secondary | ICD-10-CM | POA: Diagnosis not present

## 2017-07-21 LAB — CBC
Hematocrit: 41.7 % (ref 37.5–51.0)
Hemoglobin: 14.5 g/dL (ref 13.0–17.7)
MCH: 30.7 pg (ref 26.6–33.0)
MCHC: 34.8 g/dL (ref 31.5–35.7)
MCV: 88 fL (ref 79–97)
Platelets: 241 10*3/uL (ref 150–379)
RBC: 4.72 x10E6/uL (ref 4.14–5.80)
RDW: 14 % (ref 12.3–15.4)
WBC: 8.4 10*3/uL (ref 3.4–10.8)

## 2017-07-21 LAB — COMPREHENSIVE METABOLIC PANEL
A/G RATIO: 2 (ref 1.2–2.2)
ALBUMIN: 4.4 g/dL (ref 3.5–4.7)
ALT: 20 IU/L (ref 0–44)
AST: 19 IU/L (ref 0–40)
Alkaline Phosphatase: 66 IU/L (ref 39–117)
BILIRUBIN TOTAL: 1 mg/dL (ref 0.0–1.2)
BUN / CREAT RATIO: 19 (ref 10–24)
BUN: 16 mg/dL (ref 8–27)
CHLORIDE: 102 mmol/L (ref 96–106)
CO2: 21 mmol/L (ref 20–29)
Calcium: 9.3 mg/dL (ref 8.6–10.2)
Creatinine, Ser: 0.86 mg/dL (ref 0.76–1.27)
GFR calc non Af Amer: 81 mL/min/{1.73_m2} (ref >59)
GFR, EST AFRICAN AMERICAN: 93 mL/min/{1.73_m2} (ref >59)
GLOBULIN, TOTAL: 2.2 g/dL (ref 1.5–4.5)
Glucose: 106 mg/dL — ABNORMAL HIGH (ref 65–99)
POTASSIUM: 4.3 mmol/L (ref 3.5–5.2)
SODIUM: 140 mmol/L (ref 134–144)
TOTAL PROTEIN: 6.6 g/dL (ref 6.0–8.5)

## 2017-07-21 LAB — LIPID PANEL
Chol/HDL Ratio: 4 ratio (ref 0.0–5.0)
Cholesterol, Total: 159 mg/dL (ref 100–199)
HDL: 40 mg/dL (ref >39)
LDL Calculated: 91 mg/dL (ref 0–99)
Triglycerides: 142 mg/dL (ref 0–149)
VLDL CHOLESTEROL CAL: 28 mg/dL (ref 5–40)

## 2017-07-23 ENCOUNTER — Encounter (HOSPITAL_COMMUNITY)
Admission: RE | Admit: 2017-07-23 | Discharge: 2017-07-23 | Disposition: A | Discharge status: Home / Self Care | Payer: Self-pay | Source: Ambulatory Visit | Attending: Cardiovascular Disease | Admitting: Cardiovascular Disease

## 2017-07-25 ENCOUNTER — Encounter (HOSPITAL_COMMUNITY)
Admission: RE | Admit: 2017-07-25 | Discharge: 2017-07-25 | Disposition: A | Discharge status: Home / Self Care | Payer: Self-pay | Source: Ambulatory Visit | Attending: Cardiovascular Disease | Admitting: Cardiovascular Disease

## 2017-07-25 DIAGNOSIS — I2510 Atherosclerotic heart disease of native coronary artery without angina pectoris: Secondary | ICD-10-CM | POA: Insufficient documentation

## 2017-07-28 ENCOUNTER — Encounter (HOSPITAL_COMMUNITY): Payer: Self-pay

## 2017-07-28 ENCOUNTER — Ambulatory Visit (HOSPITAL_COMMUNITY)
Admission: RE | Admit: 2017-07-28 | Discharge: 2017-07-28 | Disposition: A | Discharge status: Home / Self Care | Payer: Medicare Other | Source: Ambulatory Visit | Attending: Cardiovascular Disease | Admitting: Cardiovascular Disease

## 2017-07-28 DIAGNOSIS — Z95828 Presence of other vascular implants and grafts: Secondary | ICD-10-CM | POA: Insufficient documentation

## 2017-07-28 DIAGNOSIS — Z6831 Body mass index (BMI) 31.0-31.9, adult: Secondary | ICD-10-CM | POA: Diagnosis not present

## 2017-07-28 DIAGNOSIS — E669 Obesity, unspecified: Secondary | ICD-10-CM | POA: Insufficient documentation

## 2017-07-28 DIAGNOSIS — Z48812 Encounter for surgical aftercare following surgery on the circulatory system: Secondary | ICD-10-CM | POA: Insufficient documentation

## 2017-07-28 DIAGNOSIS — Z8679 Personal history of other diseases of the circulatory system: Secondary | ICD-10-CM | POA: Insufficient documentation

## 2017-07-28 DIAGNOSIS — Z9889 Other specified postprocedural states: Secondary | ICD-10-CM | POA: Diagnosis not present

## 2017-07-30 ENCOUNTER — Encounter (HOSPITAL_COMMUNITY)
Admission: RE | Admit: 2017-07-30 | Discharge: 2017-07-30 | Disposition: A | Discharge status: Home / Self Care | Payer: Self-pay | Source: Ambulatory Visit | Attending: Cardiovascular Disease | Admitting: Cardiovascular Disease

## 2017-08-01 ENCOUNTER — Encounter (HOSPITAL_COMMUNITY)
Admission: RE | Admit: 2017-08-01 | Discharge: 2017-08-01 | Disposition: A | Discharge status: Home / Self Care | Payer: Self-pay | Source: Ambulatory Visit | Attending: Cardiovascular Disease | Admitting: Cardiovascular Disease

## 2017-08-01 ENCOUNTER — Other Ambulatory Visit: Payer: Self-pay | Admitting: *Deleted

## 2017-08-01 MED ORDER — METOPROLOL SUCCINATE ER 25 MG PO TB24: 12.5000 mg | ORAL_TABLET | Freq: Every day | ORAL | 3 refills | Status: DC

## 2017-08-04 ENCOUNTER — Encounter (HOSPITAL_COMMUNITY)
Admission: RE | Admit: 2017-08-04 | Discharge: 2017-08-04 | Disposition: A | Discharge status: Home / Self Care | Payer: Self-pay | Source: Ambulatory Visit | Attending: Cardiovascular Disease | Admitting: Cardiovascular Disease

## 2017-08-06 ENCOUNTER — Encounter (HOSPITAL_COMMUNITY): Payer: Self-pay

## 2017-08-08 ENCOUNTER — Encounter (HOSPITAL_COMMUNITY): Payer: Self-pay

## 2017-08-11 ENCOUNTER — Encounter (HOSPITAL_COMMUNITY): Payer: Self-pay

## 2017-08-13 ENCOUNTER — Encounter (HOSPITAL_COMMUNITY): Payer: Self-pay

## 2017-08-18 ENCOUNTER — Encounter (HOSPITAL_COMMUNITY): Payer: Self-pay

## 2017-08-20 ENCOUNTER — Encounter (HOSPITAL_COMMUNITY): Payer: Self-pay

## 2017-08-22 ENCOUNTER — Encounter (HOSPITAL_COMMUNITY): Payer: Self-pay

## 2017-08-27 DIAGNOSIS — J00 Acute nasopharyngitis [common cold]: Secondary | ICD-10-CM | POA: Diagnosis not present

## 2017-08-27 DIAGNOSIS — J209 Acute bronchitis, unspecified: Secondary | ICD-10-CM | POA: Diagnosis not present

## 2017-09-03 ENCOUNTER — Encounter (HOSPITAL_COMMUNITY): Admission: RE | Admit: 2017-09-03 | Payer: Self-pay | Source: Ambulatory Visit

## 2017-09-03 DIAGNOSIS — I2510 Atherosclerotic heart disease of native coronary artery without angina pectoris: Secondary | ICD-10-CM | POA: Insufficient documentation

## 2017-09-05 ENCOUNTER — Encounter (HOSPITAL_COMMUNITY): Payer: Self-pay

## 2017-09-08 ENCOUNTER — Encounter (HOSPITAL_COMMUNITY)
Admission: RE | Admit: 2017-09-08 | Discharge: 2017-09-08 | Disposition: A | Discharge status: Home / Self Care | Payer: Self-pay | Source: Ambulatory Visit | Attending: Cardiovascular Disease | Admitting: Cardiovascular Disease

## 2017-09-10 ENCOUNTER — Encounter (HOSPITAL_COMMUNITY): Payer: Self-pay

## 2017-09-11 ENCOUNTER — Other Ambulatory Visit (HOSPITAL_COMMUNITY): Payer: Self-pay | Admitting: Nurse Practitioner

## 2017-09-12 ENCOUNTER — Encounter (HOSPITAL_COMMUNITY)
Admission: RE | Admit: 2017-09-12 | Discharge: 2017-09-12 | Disposition: A | Discharge status: Home / Self Care | Payer: Self-pay | Source: Ambulatory Visit | Attending: Cardiovascular Disease | Admitting: Cardiovascular Disease

## 2017-09-17 ENCOUNTER — Encounter (HOSPITAL_COMMUNITY): Payer: Self-pay

## 2017-09-19 ENCOUNTER — Encounter (HOSPITAL_COMMUNITY): Payer: Self-pay

## 2017-09-22 ENCOUNTER — Encounter (HOSPITAL_COMMUNITY): Payer: Self-pay

## 2017-09-24 ENCOUNTER — Encounter (HOSPITAL_COMMUNITY): Payer: Self-pay

## 2017-09-24 DIAGNOSIS — I2510 Atherosclerotic heart disease of native coronary artery without angina pectoris: Secondary | ICD-10-CM | POA: Insufficient documentation

## 2017-09-26 ENCOUNTER — Encounter (HOSPITAL_COMMUNITY)
Admission: RE | Admit: 2017-09-26 | Discharge: 2017-09-26 | Disposition: A | Discharge status: Home / Self Care | Payer: Self-pay | Source: Ambulatory Visit | Attending: Cardiovascular Disease | Admitting: Cardiovascular Disease

## 2017-09-29 ENCOUNTER — Other Ambulatory Visit: Payer: Self-pay | Admitting: Cardiovascular Disease

## 2017-09-29 ENCOUNTER — Encounter (HOSPITAL_COMMUNITY)
Admission: RE | Admit: 2017-09-29 | Discharge: 2017-09-29 | Disposition: A | Discharge status: Home / Self Care | Payer: Self-pay | Source: Ambulatory Visit | Attending: Cardiovascular Disease | Admitting: Cardiovascular Disease

## 2017-09-30 ENCOUNTER — Telehealth: Payer: Self-pay | Admitting: *Deleted

## 2017-09-30 NOTE — Telephone Encounter (Signed)
Cardiac rehab form signed by Dr. Kelly and returned via fax at [phone] 

## 2017-10-01 ENCOUNTER — Encounter (HOSPITAL_COMMUNITY)
Admission: RE | Admit: 2017-10-01 | Discharge: 2017-10-01 | Disposition: A | Discharge status: Home / Self Care | Payer: Self-pay | Source: Ambulatory Visit | Attending: Cardiovascular Disease | Admitting: Cardiovascular Disease

## 2017-10-03 ENCOUNTER — Encounter (HOSPITAL_COMMUNITY): Payer: Self-pay

## 2017-10-06 ENCOUNTER — Encounter (HOSPITAL_COMMUNITY)
Admission: RE | Admit: 2017-10-06 | Discharge: 2017-10-06 | Disposition: A | Discharge status: Home / Self Care | Payer: Self-pay | Source: Ambulatory Visit | Attending: Cardiovascular Disease | Admitting: Cardiovascular Disease

## 2017-10-08 ENCOUNTER — Encounter (HOSPITAL_COMMUNITY)
Admission: RE | Admit: 2017-10-08 | Discharge: 2017-10-08 | Disposition: A | Discharge status: Home / Self Care | Payer: Self-pay | Source: Ambulatory Visit | Attending: Cardiovascular Disease | Admitting: Cardiovascular Disease

## 2017-10-10 ENCOUNTER — Encounter (HOSPITAL_COMMUNITY): Payer: Self-pay

## 2017-10-13 ENCOUNTER — Encounter (HOSPITAL_COMMUNITY): Payer: Self-pay

## 2017-10-15 ENCOUNTER — Encounter (HOSPITAL_COMMUNITY): Payer: Self-pay

## 2017-10-17 ENCOUNTER — Encounter (HOSPITAL_COMMUNITY): Payer: Self-pay

## 2017-10-20 ENCOUNTER — Encounter (HOSPITAL_COMMUNITY): Payer: Self-pay

## 2017-10-22 ENCOUNTER — Encounter (HOSPITAL_COMMUNITY)
Admission: RE | Admit: 2017-10-22 | Discharge: 2017-10-22 | Disposition: A | Discharge status: Home / Self Care | Payer: Self-pay | Source: Ambulatory Visit | Attending: Cardiovascular Disease | Admitting: Cardiovascular Disease

## 2017-10-24 ENCOUNTER — Encounter (HOSPITAL_COMMUNITY)
Admission: RE | Admit: 2017-10-24 | Discharge: 2017-10-24 | Disposition: A | Discharge status: Home / Self Care | Payer: Self-pay | Source: Ambulatory Visit | Attending: Cardiovascular Disease | Admitting: Cardiovascular Disease

## 2017-10-24 DIAGNOSIS — I2510 Atherosclerotic heart disease of native coronary artery without angina pectoris: Secondary | ICD-10-CM | POA: Insufficient documentation

## 2017-10-27 ENCOUNTER — Encounter (HOSPITAL_COMMUNITY): Payer: Self-pay

## 2017-10-29 ENCOUNTER — Encounter (HOSPITAL_COMMUNITY)
Admission: RE | Admit: 2017-10-29 | Discharge: 2017-10-29 | Disposition: A | Discharge status: Home / Self Care | Payer: Self-pay | Source: Ambulatory Visit | Attending: Cardiovascular Disease | Admitting: Cardiovascular Disease

## 2017-10-31 ENCOUNTER — Encounter (HOSPITAL_COMMUNITY): Payer: Self-pay

## 2017-11-03 ENCOUNTER — Encounter (HOSPITAL_COMMUNITY)
Admission: RE | Admit: 2017-11-03 | Discharge: 2017-11-03 | Disposition: A | Discharge status: Home / Self Care | Payer: Medicare Other | Source: Ambulatory Visit | Attending: Cardiovascular Disease | Admitting: Cardiovascular Disease

## 2017-11-05 ENCOUNTER — Encounter: Payer: Self-pay | Admitting: Cardiovascular Disease

## 2017-11-05 ENCOUNTER — Ambulatory Visit: Payer: Medicare Other | Admitting: Cardiovascular Disease

## 2017-11-05 VITALS — BP 142/66 | HR 63 | Ht 73.5 in | Wt 244.0 lb

## 2017-11-05 DIAGNOSIS — Z79899 Other long term (current) drug therapy: Secondary | ICD-10-CM | POA: Diagnosis not present

## 2017-11-05 DIAGNOSIS — Z9889 Other specified postprocedural states: Secondary | ICD-10-CM | POA: Diagnosis not present

## 2017-11-05 DIAGNOSIS — I10 Essential (primary) hypertension: Secondary | ICD-10-CM

## 2017-11-05 DIAGNOSIS — Z951 Presence of aortocoronary bypass graft: Secondary | ICD-10-CM | POA: Diagnosis not present

## 2017-11-05 DIAGNOSIS — I440 Atrioventricular block, first degree: Secondary | ICD-10-CM | POA: Diagnosis not present

## 2017-11-05 DIAGNOSIS — I2510 Atherosclerotic heart disease of native coronary artery without angina pectoris: Secondary | ICD-10-CM

## 2017-11-05 DIAGNOSIS — E785 Hyperlipidemia, unspecified: Secondary | ICD-10-CM

## 2017-11-05 DIAGNOSIS — D509 Iron deficiency anemia, unspecified: Secondary | ICD-10-CM | POA: Diagnosis not present

## 2017-11-05 DIAGNOSIS — Z8679 Personal history of other diseases of the circulatory system: Secondary | ICD-10-CM | POA: Diagnosis not present

## 2017-11-05 NOTE — Progress Notes (Signed)
Patient ID: Derek Wells, male   DOB: 14-Oct-1932, 84 y.o.   MRN: [id number]     HPI: Derek Wells is a 84 y.o. male who presents to the office today for a 4 month followup cardiology evaluation   Derek Wells has a history of hypertension, hyperlipidemia, and is s/p abdominal aortic aneurysm repair in April 2010. I had seen him on 01/14/2013 at which time he had experienced symptoms worrisome for unstable angina. Previously, in September 2013 he had undergone a nuclear perfusion study  which was essentially normal although did show mild diaphragmatic attenuation. He also been found to have a atherogenic dyslipidemia pattern with reference to his lipid panel. Catheterization the following day on 01/15/2013 revealed life-threatening coronary anatomy with 60-70% ostial left main stenosis, 90-95% distal left main stenosis, 95-99% ostial LAD stenosis, and mild/moderate stenoses in the RCA. That same day he was successfully operated by Dr. Arvid Wells and underwent CABG x3 with a LIMA to the LAD, SVG to the OM, and SVG to the PDA. He had a unremarkable postoperative course and was discharged 4 days later.  He saw Derek Wells on 01/27/13 and was doing well. I saw him in followup on 03/02/2013 and he  participating  in cardiac rehabilitation. When I saw him in September 2014, he was in sinus rhythm and has and had resumed a good level of activity.   In January 2015, he was found to be in atrial flutter at cardiac rehabilitation. He was unaware of his heart rhythm being abnormal. He had developed a URI over the holidays prior to that and had just completed a steroid dose pack. Xarelto 20 mg was added to his medical regimen. He did undergo subsequent blood work on January 20 which showed normal renal function and normal electrolytes. Liver function studies were normal. Hemoglobin was 13.7 hematocrit 41.7. Platelets were normal. TSH was normal at 2.63. Magnesium was normal at 2.0.  He underwent successful cardioversion for  his atrial flutter on 11/12/2013 and he cardioverted to sinus rhythm at 100 J.  Since his cardioversion, he is unaware of any recurrent rhythm disturbance.   He is in the maintenance phase of cardiac rehabilitation.  When I  saw him in June 2015, he told me that he had noticed several episodes of a vague chest sensation particularly when he rolls his garbage can back up a hill at home.    On 03/18/2014, he underwent a nuclear perfusion study, which showed reduced exercise capacity with a workload of only 5.9.  He did experience mild shortness of breath.  There was no significant ST depression, but he had developed transient left bundle branch block.  The scan was interpreted at low as low risk.  Ejection fraction was 65% without wall motion abnormalities.  An echo Doppler study showed an EF of 60-65%.  There were no regional wall motion abnormalities.  He had mild biatrial enlargement.  Derek Wells currently is playing golf at least 2 times per week and occasionally 3.  He is in the maintenance phase of cardiac rehabilitation.  He does admit to some weight gain.  He denies any episodes of chest pain, particularly during the cardiac rehabilitation program.  He has noticed some mild shortness of breath with deep breathing and some vague pressure in his chest which is short-lived walking up hills when he walks fast with his wife.  He has noticed some mild shortness of breath walking up steep hills in the golf course without chest pressure.  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 2016.  When I saw him in 2017 he was anemic and had significantly reduced iron saturation at 6%, and his serum ferritin was 7.  I referred him for GI evaluation and underwent both colonoscopy and endoscopy by Dr. Laurence Wells and 4 polyps were removed from his colon.  He was not having any active bleeding.  He does note improved energy.  He had taken Niferex initially and transitioned to over-the-counter iron. He  has more energy.  He is continuing with cardiac rehabilitation.  He underwent a follow-up abdominal ultrasound on 06/09/2015 which showed a patent aortobifemoral bypass graft without focal dilatation or stenosis.   In the late summer of 2017 he was at the beach for several weeks and played golf heat without chest pain.  However with fast walking he noted chest pressure.  For the past month he has had a cough.  He has a ventral hernia.  When I saw him, his ECG showed progressive PR prolongation.  I reduced his Lopressor back to 25 mg daily and added amlodipine 5 mg blood pressure and his chest tightness.  I scheduled him for a nuclear perfusion study which was done on 07/23/2016.  Ejection fraction was 62%.  There were no ECG changes ischemia, but there was a rare PVC.  He had normal perfusion without scar or ischemia.  His follow-up laboratory showed significant improvement with his iron saturation, now at 27%.  Lipid studies revealed a total cholesterol 164, triglycerides 122, HDL 40, and LDL 100.    When I last saw him in January 2018, I suggested the addition of Zetia to his atorvastatin.  In the past.  He did not tolerate 80 mg, atorvastatin, and also had myalgias on 40 mg.  I also discussed plaque regression studies with Repatha.  He developed recurrent atrial fibrillation and had multiple evaluations in the A. fib clinic commencing in July 2018.  Ultimately, on 05/13/2017, he underwent successful cardioversion with restoration of sinus rhythm.  He saw Derek Wells in follow-up of his cardioversion and was maintaining sinus rhythm.  There was discussion concerning his EtOH use and reduction in consumption.  He typically has at least 1 or more vodka every day.    Since I last saw him on 07/03/2017.  He has continued to feel well and denies recurrent episodes of atrial fibrillation.  07/17/2017.  An echo Doppler study showed normal LV systolic and diastolic function.  There was biatrial enlargement and  mild aortic valve stenosis with a mean gradient of 14 and peak gradient of 25.  In follow-up abdominal aortic Doppler study.  The largest aortic measurement was 2.7 cm.  He is continued to be active.  He plays golf.  Last week when the weather was somewhat warm for February, he played golf 3 days.  He continues to participate in the maintenance phase of cardiac rehabilitation.  He presents for reevaluation.    Past Medical History:  Diagnosis Date  . CAD (coronary artery disease), severe needing CABG 01/19/2013  . Chest pain    2D ECHO, 06/16/2012 - EF 50-55%, borderline low left ventricular systolic function  . Coronary artery disease   . Dyspnea on exertion    LEXISCAN, 06/17/2012 - fixed inferior bowel artifact and basal septal defect, probably related to IVCD/incomplete LBBB, no reversible ischemia, post-stress EF 66%  . History of AAA (abdominal aortic aneurysm) repair    ABDOMINAL AORTIC DOPPLER, 06/16/2013 -  normal  . HTN (hypertension) 01/19/2013  . Hyperlipidemia LDL goal < 70 01/19/2013  . Hypertension   . S/P CABG x 3 01/19/2013    Past Surgical History:  Procedure Laterality Date  . CARDIAC CATHETERIZATION  01/15/2013   Severe coronary obstructive disease with 60-70% ostial left main and 90-85% distal LAD stenosis and mild-moderate stenoses in RCA, recommended for CABG  . CARDIOVERSION N/A 11/12/2013   Procedure: CARDIOVERSION;  Surgeon: Troy Sine, MD;  Location: [facility] ENDOSCOPY;  Service: Cardiovascular;  Laterality: N/A;  11:16 Lido 71m, Propofol 1253mIV 11:19 synch 100 joules conversion conversion to NSR....  . CARDIOVERSION N/A 05/13/2017   Procedure: CARDIOVERSION;  Surgeon: NaAcie FredricksonhWonda ChengMD;  [facility];  Service: Cardiovascular;  Laterality: N/A;  . CORONARY ARTERY BYPASS GRAFT N/A 01/15/2013   Procedure: Coronary Artery Bypass Graft times three using Wells Greater Saphenous Vein Graft harvested endoscopically and Left Internal Mammary Artery and;  Surgeon:  BrGaye PollackMD;  [facility];  Service: Open Heart Surgery;  Laterality: N/A;  . INTRAOPERATIVE TRANSESOPHAGEAL ECHOCARDIOGRAM  01/15/2013   Procedure: INTRAOPERATIVE TRANSESOPHAGEAL ECHOCARDIOGRAM;  Surgeon: BrGaye PollackMD;  [facility];  Service: Open Heart Surgery;;  . LEFT HEART CATHETERIZATION WITH CORONARY ANGIOGRAM N/A 01/15/2013   Procedure: LEFT HEART CATHETERIZATION WITH CORONARY ANGIOGRAM;  Surgeon: ThTroy SineMD;  [facility];  Service: Cardiovascular;  Laterality: N/A;  . VASCULAR SURGERY      Allergies  Allergen Reactions  . Penicillins Rash    Current Outpatient Medications  Medication Sig Dispense Refill  . amLODipine (NORVASC) 5 MG tablet TAKE 1 TABLET BY MOUTH ONCE DAILY 90 tablet 2  . atorvastatin (LIPITOR) 40 MG tablet TAKE 1 TABLET BY MOUTH ONCE DAILY 90 tablet 0  . ELIQUIS 5 MG TABS tablet TAKE 1 TABLET BY MOUTH TWICE DAILY 60 tablet 6  . irbesartan (AVAPRO) 150 MG tablet Take 1 tablet (150 mg total) by mouth daily. 90 tablet 3  . metFORMIN (GLUCOPHAGE) 500 MG tablet Take 500 mg by mouth daily.    . metoprolol succinate (TOPROL-XL) 25 MG 24 hr tablet Take 0.5 tablets (12.5 mg total) daily by mouth. 30 tablet 3  . Multiple Vitamin (MULTIVITAMIN WITH MINERALS) TABS Take 1 tablet by mouth daily.     No current facility-administered medications for this visit.     Socially he is re-married. His first wife of 42 years passed away. He previously had smoked cigars prior to his bypass surgery but has not had any tobacco use since.  He continues to play golf at least 2 times per week.  He does a lot of leg exercise with cardiac rehabilitation.  ROS General: Negative; No fevers, chills, or night sweats;  HEENT: Negative; No changes in vision or hearing, sinus congestion, difficulty swallowing Pulmonary: URI symptoms during the winter; No cough, wheezing, shortness of breath, hemoptysis Cardiovascular: Mild shortness of breath with uphill  walking; see history of present illness GI: Negative; No nausea, vomiting, diarrhea, or abdominal pain GU: Negative; No dysuria, hematuria, or difficulty voiding Musculoskeletal: Negative; no myalgias, joint pain, or weakness Hematologic/Oncology: Negative; no easy bruising, bleeding Endocrine: Negative; no heat/cold intolerance; no diabetes Neuro: Negative; no changes in balance, headaches Skin: Removal of small left forearm melanoma. Psychiatric: Negative; No behavioral problems, depression Sleep: Negative; No snoring, daytime sleepiness, hypersomnolence, bruxism, restless legs, hypnogognic hallucinations, no cataplexy Other comprehensive 14 point system review is negative.   PE BP (!) 142/66   Pulse 63  Ht 6' 1.5" (1.867 m)   Wt 244 lb (110.7 kg)   BMI 31.76 kg/m    Repeat blood pressure was 130/74  Wt Readings from Last 3 Encounters:  11/05/17 244 lb (110.7 kg)  07/03/17 241 lb 9.6 oz (109.6 kg)  05/19/17 241 lb 9.6 oz (109.6 kg)   General: Alert, oriented, no distress.  Skin: normal turgor, no rashes, warm and dry HEENT: Normocephalic, atraumatic. Pupils equal round and reactive to light; sclera anicteric; extraocular muscles intact;  Nose without nasal septal hypertrophy Mouth/Parynx benign; Mallinpatti scale 3 Neck: No JVD, no carotid bruits; normal carotid upstroke Lungs: clear to ausculatation and percussion; no wheezing or rales Chest wall: without tenderness to palpitation Heart: PMI not displaced, RRR, s1 s2 normal, 2/6 systolic murmur, no diastolic murmur, no rubs, gallops, thrills, or heaves Abdomen: soft, nontender; no hepatosplenomehaly, BS+; abdominal aorta nontender and not dilated by palpation. Back: no CVA tenderness Pulses 2+ Musculoskeletal: full range of motion, normal strength, no joint deformities Extremities: no clubbing cyanosis or edema, Homan's sign negative  Neurologic: grossly nonfocal; Cranial nerves grossly wnl Psychologic: Normal mood and  affect   ECG (independently read by me): Sinus rhythm at 63 bpm.  First-degree AV block with a PR interval at 344 ms.  PAC.  Nonspecific interventricular conduction delay.  October 2018 ECG (independently read by me): Sinus rhythm with first-degree AV block with a PR interval of 314 ms.  Nonspecific interventricular block.  QTc interval 431  January 2018 ECG (independently read by me): Normal sinus rhythm with first-degree AV block.  PR interval 300 ms.  Nonspecific interventricular conduction delay.  October 2017 ECG (independently read by me): Normal sinus rhythm at 66 bpm.  First-degree block with a PR interval at 290 ms.  Nonspecific interventricular block.  03/02/2015 ECG (independently read by me): Sinus rhythm at 75 bpm.  Occasional unifocal PVCs.  Nonspecific interventricular block.  Prior October 2015 ECG (independently read by me): Normal sinus rhythm at 67 beats per minute.  First degree A-V block with PR interval at 290 ms.  QTc interval 439 ms.  No ST segment changes.  Prior June 2015 ECG (independently read by me): Normal sinus rhythm at 68 beats per minute with first degree AV block with a PR interval at 284 ms.  QTc interval 435 ms.  No significant ST-T changes.  T-wave inversion in aVL  ECG today (independently read by me):  Normal sinus rhythm at 71 beats per minute. First create a block with PR interval at 240 ms. QTc interval normal at 4-5 ms.  Prior 10/27/13 ECG (independently read by me): Atrial flutter with variable rate but predominantly 4-1 with occasional 3-1 block; average heart rate 70 beats per minute  Prior ECG from 06/04/2013: Sinus rhythm with first-degree AV block. Nonspecific interventricular block  LABS: BMP Latest Ref Rng & Units 07/21/2017 05/09/2017 04/15/2017  Glucose 65 - 99 mg/dL 106(H) 134(H) 137(H)  BUN 8 - 27 mg/dL 16 15 21(H)  Creatinine 0.76 - 1.27 mg/dL 0.86 0.95 1.10  BUN/Creat Ratio 10 - 24 19 - -  Sodium 134 - 144 mmol/L 140 139 138    Potassium 3.5 - 5.2 mmol/L 4.3 3.9 5.2(H)  Chloride 96 - 106 mmol/L 102 108 104  CO2 20 - 29 mmol/L _0 Calcium 8.6 - 10.2 mg/dL 9.3 9.3 9.8   Hepatic Function Latest Ref Rng & Units 07/21/2017 03/07/2017 07/19/2016  Total Protein 6.0 - 8.5 g/dL 6.6 6.7 6.6  Albumin 3.5 -  4.7 g/dL 4.4 4.3 4.2  AST 0 - 40 IU/L _0 ALT 0 - 44 IU/L _1 Alk Phosphatase 39 - 117 IU/L 66 64 52  Total Bilirubin 0.0 - 1.2 mg/dL 1.0 0.9 0.9  Bilirubin, Direct 0.00 - 0.40 mg/dL - 0.22 -   CBC Latest Ref Rng & Units 07/21/2017 05/09/2017 04/23/2017  WBC 3.4 - 10.8 x10E3/uL 8.4 8.6 8.5  Hemoglobin 13.0 - 17.7 g/dL 14.5 13.9 14.0  Hematocrit 37.5 - 51.0 % 41.7 42.0 41.6  Platelets 150 - 379 x10E3/uL 241 261 239   Lab Results  Component Value Date   MCV 88 07/21/2017   MCV 90.3 05/09/2017   MCV 90.0 04/23/2017   Lab Results  Component Value Date   TSH 2.810 04/15/2017   Lipid Panel     Component Value Date/Time   CHOL 159 07/21/2017 0955   CHOL 111 03/01/2015 1033   TRIG 142 07/21/2017 0955   TRIG 90 03/01/2015 1033   HDL 40 07/21/2017 0955   HDL 41 03/01/2015 1033   CHOLHDL 4.0 07/21/2017 0955   CHOLHDL 4.1 07/19/2016 0949   VLDL 24 07/19/2016 0949   LDLCALC 91 07/21/2017 0955   LDLCALC 52 03/01/2015 1033     RADIOLOGY: No results found.  IMPRESSION:  1. Coronary artery disease involving native coronary artery of native heart without angina pectoris   2. S/P CABG x 3, LIMA-LAD, VG-OM, VG-PDA 01/15/13    3. Medication management   4. Hyperlipidemia with target LDL less than 70   5. Essential hypertension   6. Iron deficiency anemia, unspecified iron deficiency anemia type   7. S/P AAA repair   8. First degree AV block     ASSESSMENT AND PLAN: Derek Wells is an active 84 year old white male who is status post emergent CABG revascularization surgery after cardiac catheterization on 01/15/2013 demonstrated severe life threatening coronary anatomy.   He  is in the maintenance  phase of cardiac rehabilitation. He  developed atrial flutter in January 2015 and was started on anticoagulation with Xarelto and underwent cardioversion.  He develop recurrent episodes of atrial fibrillation last year and has been maintaining sinus rhythm since his repeat cardioversion on 05/13/2017.  His blood pressure today is well controlled on his current regimen consisting of amlodipine 5 mg, irbesartan 150 mg and Toprol-XL 12.5 mg.  His ECG shows sinus rhythm at 63 bpm with first-degree AV block.  He continues to be on atorvastatin 40 mg for hyperlipidemia with target LDL less than 70.  When I last saw him I recommended consideration for addition of Zetia.  Apparently there were issues with cost and insurance and he is currently not taking this.  I reviewed his most recent echo Doppler study, which shows preserved global LV function with normal diastolic parameters.  There is mild aortic stenosis.  I reviewed his abdominal aortic ultrasound, which remained stable following his prior resection.  I reviewed laboratory from October.  LDL at that time was elevated at 91.  We discussed improved diet and importance to  get under 70 and also discussed the possibility of PCSK9 inhibition.  He continues to participate in cardiac rehabilitation.  We discussed weight loss.  There is remote history of iron deficiency anemia, which has stabilized.  I will see him in 6 months for reevaluation.     Troy Sine, MD, [facility]  11/07/2017 2:34 PM

## 2017-11-05 NOTE — Patient Instructions (Signed)
Medication Instructions:  Your physician recommends that you continue on your current medications as directed. Please refer to the Current Medication list given to you today.  Labwork: Please return for FASTING labs in 6 months (CMET, CBC, Lipid, TSH)  Follow-Up: Your physician wants you to follow-up in: 6 months with Dr. Tresa EndoKelly.  You will receive a reminder letter in the mail two months in advance. If you don't receive a letter, please call our office to schedule the follow-up appointment.   Any Other Special Instructions Will Be Listed Below (If Applicable).     If you need a refill on your cardiac medications before your next appointment, please call your pharmacy.

## 2017-11-07 ENCOUNTER — Encounter (HOSPITAL_COMMUNITY): Payer: Self-pay

## 2017-11-07 ENCOUNTER — Encounter: Payer: Self-pay | Admitting: Cardiovascular Disease

## 2017-11-10 ENCOUNTER — Encounter (HOSPITAL_COMMUNITY): Payer: Self-pay

## 2017-11-12 ENCOUNTER — Encounter (HOSPITAL_COMMUNITY): Payer: Self-pay

## 2017-11-14 ENCOUNTER — Encounter (HOSPITAL_COMMUNITY): Payer: Self-pay

## 2017-11-17 ENCOUNTER — Encounter (HOSPITAL_COMMUNITY): Payer: Self-pay

## 2017-11-19 ENCOUNTER — Encounter (HOSPITAL_COMMUNITY)
Admission: RE | Admit: 2017-11-19 | Discharge: 2017-11-19 | Disposition: A | Discharge status: Home / Self Care | Payer: Self-pay | Source: Ambulatory Visit | Attending: Cardiovascular Disease | Admitting: Cardiovascular Disease

## 2017-11-21 ENCOUNTER — Encounter (HOSPITAL_COMMUNITY): Payer: Self-pay

## 2017-11-21 DIAGNOSIS — I2510 Atherosclerotic heart disease of native coronary artery without angina pectoris: Secondary | ICD-10-CM | POA: Insufficient documentation

## 2017-11-24 ENCOUNTER — Encounter (HOSPITAL_COMMUNITY)
Admission: RE | Admit: 2017-11-24 | Discharge: 2017-11-24 | Disposition: A | Discharge status: Home / Self Care | Payer: Self-pay | Source: Ambulatory Visit | Attending: Cardiovascular Disease | Admitting: Cardiovascular Disease

## 2017-11-26 ENCOUNTER — Encounter (HOSPITAL_COMMUNITY)
Admission: RE | Admit: 2017-11-26 | Discharge: 2017-11-26 | Disposition: A | Discharge status: Home / Self Care | Payer: Self-pay | Source: Ambulatory Visit | Attending: Cardiovascular Disease | Admitting: Cardiovascular Disease

## 2017-11-28 ENCOUNTER — Encounter (HOSPITAL_COMMUNITY): Payer: Self-pay

## 2017-12-01 ENCOUNTER — Encounter (HOSPITAL_COMMUNITY)
Admission: RE | Admit: 2017-12-01 | Discharge: 2017-12-01 | Disposition: A | Discharge status: Home / Self Care | Payer: Self-pay | Source: Ambulatory Visit | Attending: Cardiovascular Disease | Admitting: Cardiovascular Disease

## 2017-12-03 ENCOUNTER — Encounter (HOSPITAL_COMMUNITY)
Admission: RE | Admit: 2017-12-03 | Discharge: 2017-12-03 | Disposition: A | Discharge status: Home / Self Care | Payer: Self-pay | Source: Ambulatory Visit | Attending: Cardiovascular Disease | Admitting: Cardiovascular Disease

## 2017-12-05 ENCOUNTER — Encounter (HOSPITAL_COMMUNITY)
Admission: RE | Admit: 2017-12-05 | Discharge: 2017-12-05 | Disposition: A | Discharge status: Home / Self Care | Payer: Medicare Other | Source: Ambulatory Visit | Attending: Cardiovascular Disease | Admitting: Cardiovascular Disease

## 2017-12-08 ENCOUNTER — Encounter (HOSPITAL_COMMUNITY): Payer: Self-pay

## 2017-12-09 DIAGNOSIS — Z0000 Encounter for general adult medical examination without abnormal findings: Secondary | ICD-10-CM | POA: Diagnosis not present

## 2017-12-09 DIAGNOSIS — I10 Essential (primary) hypertension: Secondary | ICD-10-CM | POA: Diagnosis not present

## 2017-12-09 DIAGNOSIS — E559 Vitamin D deficiency, unspecified: Secondary | ICD-10-CM | POA: Diagnosis not present

## 2017-12-09 DIAGNOSIS — Z1389 Encounter for screening for other disorder: Secondary | ICD-10-CM | POA: Diagnosis not present

## 2017-12-10 ENCOUNTER — Encounter (HOSPITAL_COMMUNITY)
Admission: RE | Admit: 2017-12-10 | Discharge: 2017-12-10 | Disposition: A | Discharge status: Home / Self Care | Payer: Self-pay | Source: Ambulatory Visit | Attending: Cardiovascular Disease | Admitting: Cardiovascular Disease

## 2017-12-12 ENCOUNTER — Encounter (HOSPITAL_COMMUNITY): Payer: Self-pay

## 2017-12-15 ENCOUNTER — Encounter (HOSPITAL_COMMUNITY)
Admission: RE | Admit: 2017-12-15 | Discharge: 2017-12-15 | Disposition: A | Discharge status: Home / Self Care | Payer: Self-pay | Source: Ambulatory Visit | Attending: Cardiovascular Disease | Admitting: Cardiovascular Disease

## 2017-12-17 ENCOUNTER — Encounter (HOSPITAL_COMMUNITY)
Admission: RE | Admit: 2017-12-17 | Discharge: 2017-12-17 | Disposition: A | Discharge status: Home / Self Care | Payer: Self-pay | Source: Ambulatory Visit | Attending: Cardiovascular Disease | Admitting: Cardiovascular Disease

## 2017-12-19 ENCOUNTER — Encounter (HOSPITAL_COMMUNITY): Payer: Self-pay

## 2017-12-22 ENCOUNTER — Encounter (HOSPITAL_COMMUNITY)
Admission: RE | Admit: 2017-12-22 | Discharge: 2017-12-22 | Disposition: A | Discharge status: Home / Self Care | Payer: Self-pay | Source: Ambulatory Visit | Attending: Cardiovascular Disease | Admitting: Cardiovascular Disease

## 2017-12-22 DIAGNOSIS — I2510 Atherosclerotic heart disease of native coronary artery without angina pectoris: Secondary | ICD-10-CM | POA: Insufficient documentation

## 2017-12-24 ENCOUNTER — Encounter (HOSPITAL_COMMUNITY)
Admission: RE | Admit: 2017-12-24 | Discharge: 2017-12-24 | Disposition: A | Discharge status: Home / Self Care | Payer: Medicare Other | Source: Ambulatory Visit | Attending: Cardiovascular Disease | Admitting: Cardiovascular Disease

## 2017-12-26 ENCOUNTER — Encounter (HOSPITAL_COMMUNITY): Payer: Self-pay

## 2017-12-29 ENCOUNTER — Encounter (HOSPITAL_COMMUNITY)
Admission: RE | Admit: 2017-12-29 | Discharge: 2017-12-29 | Disposition: A | Discharge status: Home / Self Care | Payer: Self-pay | Source: Ambulatory Visit | Attending: Cardiovascular Disease | Admitting: Cardiovascular Disease

## 2017-12-31 ENCOUNTER — Encounter (HOSPITAL_COMMUNITY)
Admission: RE | Admit: 2017-12-31 | Discharge: 2017-12-31 | Disposition: A | Discharge status: Home / Self Care | Payer: Self-pay | Source: Ambulatory Visit | Attending: Cardiovascular Disease | Admitting: Cardiovascular Disease

## 2018-01-02 ENCOUNTER — Encounter (HOSPITAL_COMMUNITY)
Admission: RE | Admit: 2018-01-02 | Discharge: 2018-01-02 | Disposition: A | Discharge status: Home / Self Care | Payer: Self-pay | Source: Ambulatory Visit | Attending: Cardiovascular Disease | Admitting: Cardiovascular Disease

## 2018-01-04 ENCOUNTER — Other Ambulatory Visit: Payer: Self-pay | Admitting: Cardiovascular Disease

## 2018-01-05 ENCOUNTER — Encounter (HOSPITAL_COMMUNITY)
Admission: RE | Admit: 2018-01-05 | Discharge: 2018-01-05 | Disposition: A | Discharge status: Home / Self Care | Payer: Self-pay | Source: Ambulatory Visit | Attending: Cardiovascular Disease | Admitting: Cardiovascular Disease

## 2018-01-05 NOTE — Telephone Encounter (Signed)
Rx has been sent to the pharmacy electronically. ° °

## 2018-01-07 ENCOUNTER — Encounter (HOSPITAL_COMMUNITY)
Admission: RE | Admit: 2018-01-07 | Discharge: 2018-01-07 | Disposition: A | Discharge status: Home / Self Care | Payer: Self-pay | Source: Ambulatory Visit | Attending: Cardiovascular Disease | Admitting: Cardiovascular Disease

## 2018-01-09 ENCOUNTER — Encounter (HOSPITAL_COMMUNITY): Payer: Self-pay

## 2018-01-12 ENCOUNTER — Encounter (HOSPITAL_COMMUNITY): Payer: Self-pay

## 2018-01-14 ENCOUNTER — Encounter (HOSPITAL_COMMUNITY)
Admission: RE | Admit: 2018-01-14 | Discharge: 2018-01-14 | Disposition: A | Discharge status: Home / Self Care | Payer: Medicare Other | Source: Ambulatory Visit | Attending: Cardiovascular Disease | Admitting: Cardiovascular Disease

## 2018-01-16 ENCOUNTER — Encounter (HOSPITAL_COMMUNITY): Payer: Self-pay

## 2018-01-19 ENCOUNTER — Encounter (HOSPITAL_COMMUNITY)
Admission: RE | Admit: 2018-01-19 | Discharge: 2018-01-19 | Disposition: A | Discharge status: Home / Self Care | Payer: Self-pay | Source: Ambulatory Visit | Attending: Cardiovascular Disease | Admitting: Cardiovascular Disease

## 2018-01-21 ENCOUNTER — Encounter (HOSPITAL_COMMUNITY): Payer: Self-pay

## 2018-01-21 DIAGNOSIS — I2510 Atherosclerotic heart disease of native coronary artery without angina pectoris: Secondary | ICD-10-CM | POA: Insufficient documentation

## 2018-01-23 ENCOUNTER — Encounter (HOSPITAL_COMMUNITY): Payer: Self-pay

## 2018-01-26 ENCOUNTER — Encounter (HOSPITAL_COMMUNITY): Payer: Self-pay

## 2018-01-28 ENCOUNTER — Encounter (HOSPITAL_COMMUNITY)
Admission: RE | Admit: 2018-01-28 | Discharge: 2018-01-28 | Disposition: A | Discharge status: Home / Self Care | Payer: Self-pay | Source: Ambulatory Visit | Attending: Cardiovascular Disease | Admitting: Cardiovascular Disease

## 2018-01-30 ENCOUNTER — Encounter (HOSPITAL_COMMUNITY)
Admission: RE | Admit: 2018-01-30 | Discharge: 2018-01-30 | Disposition: A | Discharge status: Home / Self Care | Payer: Self-pay | Source: Ambulatory Visit | Attending: Cardiovascular Disease | Admitting: Cardiovascular Disease

## 2018-01-30 DIAGNOSIS — R972 Elevated prostate specific antigen [PSA]: Secondary | ICD-10-CM | POA: Diagnosis not present

## 2018-01-30 DIAGNOSIS — N390 Urinary tract infection, site not specified: Secondary | ICD-10-CM | POA: Diagnosis not present

## 2018-02-02 ENCOUNTER — Encounter (HOSPITAL_COMMUNITY)
Admission: RE | Admit: 2018-02-02 | Discharge: 2018-02-02 | Disposition: A | Discharge status: Home / Self Care | Payer: Self-pay | Source: Ambulatory Visit | Attending: Cardiovascular Disease | Admitting: Cardiovascular Disease

## 2018-02-04 ENCOUNTER — Encounter (HOSPITAL_COMMUNITY)
Admission: RE | Admit: 2018-02-04 | Discharge: 2018-02-04 | Disposition: A | Discharge status: Home / Self Care | Payer: Self-pay | Source: Ambulatory Visit | Attending: Cardiovascular Disease | Admitting: Cardiovascular Disease

## 2018-02-06 ENCOUNTER — Encounter (HOSPITAL_COMMUNITY): Payer: Self-pay

## 2018-02-09 ENCOUNTER — Encounter (HOSPITAL_COMMUNITY)
Admission: RE | Admit: 2018-02-09 | Discharge: 2018-02-09 | Disposition: A | Discharge status: Home / Self Care | Payer: Self-pay | Source: Ambulatory Visit | Attending: Cardiovascular Disease | Admitting: Cardiovascular Disease

## 2018-02-11 ENCOUNTER — Encounter (HOSPITAL_COMMUNITY)
Admission: RE | Admit: 2018-02-11 | Discharge: 2018-02-11 | Disposition: A | Discharge status: Home / Self Care | Payer: Self-pay | Source: Ambulatory Visit | Attending: Cardiovascular Disease | Admitting: Cardiovascular Disease

## 2018-02-13 ENCOUNTER — Encounter (HOSPITAL_COMMUNITY): Payer: Self-pay

## 2018-02-18 ENCOUNTER — Encounter (HOSPITAL_COMMUNITY)
Admission: RE | Admit: 2018-02-18 | Discharge: 2018-02-18 | Disposition: A | Discharge status: Home / Self Care | Payer: Medicare Other | Source: Ambulatory Visit | Attending: Cardiovascular Disease | Admitting: Cardiovascular Disease

## 2018-02-20 ENCOUNTER — Encounter (HOSPITAL_COMMUNITY)
Admission: RE | Admit: 2018-02-20 | Discharge: 2018-02-20 | Disposition: A | Discharge status: Home / Self Care | Payer: Medicare Other | Source: Ambulatory Visit | Attending: Cardiovascular Disease | Admitting: Cardiovascular Disease

## 2018-02-23 ENCOUNTER — Encounter (HOSPITAL_COMMUNITY)
Admission: RE | Admit: 2018-02-23 | Discharge: 2018-02-23 | Disposition: A | Discharge status: Home / Self Care | Payer: Self-pay | Source: Ambulatory Visit | Attending: Cardiovascular Disease | Admitting: Cardiovascular Disease

## 2018-02-23 DIAGNOSIS — I2510 Atherosclerotic heart disease of native coronary artery without angina pectoris: Secondary | ICD-10-CM | POA: Insufficient documentation

## 2018-02-25 ENCOUNTER — Encounter (HOSPITAL_COMMUNITY): Payer: Self-pay

## 2018-02-27 ENCOUNTER — Encounter (HOSPITAL_COMMUNITY): Payer: Self-pay

## 2018-03-02 ENCOUNTER — Encounter (HOSPITAL_COMMUNITY): Payer: Self-pay

## 2018-03-04 ENCOUNTER — Encounter (HOSPITAL_COMMUNITY)
Admission: RE | Admit: 2018-03-04 | Discharge: 2018-03-04 | Disposition: A | Discharge status: Home / Self Care | Payer: Self-pay | Source: Ambulatory Visit | Attending: Cardiovascular Disease | Admitting: Cardiovascular Disease

## 2018-03-04 ENCOUNTER — Other Ambulatory Visit: Payer: Self-pay | Admitting: Cardiovascular Disease

## 2018-03-06 ENCOUNTER — Encounter (HOSPITAL_COMMUNITY)
Admission: RE | Admit: 2018-03-06 | Discharge: 2018-03-06 | Disposition: A | Discharge status: Home / Self Care | Payer: Self-pay | Source: Ambulatory Visit | Attending: Cardiovascular Disease | Admitting: Cardiovascular Disease

## 2018-03-09 ENCOUNTER — Encounter (HOSPITAL_COMMUNITY)
Admission: RE | Admit: 2018-03-09 | Discharge: 2018-03-09 | Disposition: A | Discharge status: Home / Self Care | Payer: Self-pay | Source: Ambulatory Visit | Attending: Cardiovascular Disease | Admitting: Cardiovascular Disease

## 2018-03-11 ENCOUNTER — Encounter (HOSPITAL_COMMUNITY): Payer: Self-pay

## 2018-03-13 ENCOUNTER — Encounter (HOSPITAL_COMMUNITY)
Admission: RE | Admit: 2018-03-13 | Discharge: 2018-03-13 | Disposition: A | Discharge status: Home / Self Care | Payer: Self-pay | Source: Ambulatory Visit | Attending: Cardiovascular Disease | Admitting: Cardiovascular Disease

## 2018-03-16 ENCOUNTER — Encounter (HOSPITAL_COMMUNITY)
Admission: RE | Admit: 2018-03-16 | Discharge: 2018-03-16 | Disposition: A | Discharge status: Home / Self Care | Payer: Self-pay | Source: Ambulatory Visit | Attending: Cardiovascular Disease | Admitting: Cardiovascular Disease

## 2018-03-18 ENCOUNTER — Encounter (HOSPITAL_COMMUNITY)
Admission: RE | Admit: 2018-03-18 | Discharge: 2018-03-18 | Disposition: A | Discharge status: Home / Self Care | Payer: Medicare Other | Source: Ambulatory Visit | Attending: Cardiovascular Disease | Admitting: Cardiovascular Disease

## 2018-03-20 ENCOUNTER — Encounter (HOSPITAL_COMMUNITY)
Admission: RE | Admit: 2018-03-20 | Discharge: 2018-03-20 | Disposition: A | Discharge status: Home / Self Care | Payer: Medicare Other | Source: Ambulatory Visit | Attending: Cardiovascular Disease | Admitting: Cardiovascular Disease

## 2018-03-23 ENCOUNTER — Encounter (HOSPITAL_COMMUNITY)
Admission: RE | Admit: 2018-03-23 | Discharge: 2018-03-23 | Disposition: A | Discharge status: Home / Self Care | Payer: Self-pay | Source: Ambulatory Visit | Attending: Cardiovascular Disease | Admitting: Cardiovascular Disease

## 2018-03-23 DIAGNOSIS — I2510 Atherosclerotic heart disease of native coronary artery without angina pectoris: Secondary | ICD-10-CM | POA: Insufficient documentation

## 2018-04-02 ENCOUNTER — Other Ambulatory Visit (HOSPITAL_COMMUNITY): Payer: Self-pay | Admitting: Nurse Practitioner

## 2018-04-04 ENCOUNTER — Other Ambulatory Visit: Payer: Self-pay | Admitting: Cardiovascular Disease

## 2018-04-07 ENCOUNTER — Other Ambulatory Visit: Payer: Self-pay

## 2018-04-07 MED ORDER — IRBESARTAN 150 MG PO TABS: 150.0000 mg | ORAL_TABLET | Freq: Every day | ORAL | 1 refills | Status: DC

## 2018-04-10 ENCOUNTER — Encounter (HOSPITAL_COMMUNITY)
Admission: RE | Admit: 2018-04-10 | Discharge: 2018-04-10 | Disposition: A | Discharge status: Home / Self Care | Payer: Self-pay | Source: Ambulatory Visit | Attending: Cardiovascular Disease | Admitting: Cardiovascular Disease

## 2018-04-13 ENCOUNTER — Encounter (HOSPITAL_COMMUNITY)
Admission: RE | Admit: 2018-04-13 | Discharge: 2018-04-13 | Disposition: A | Discharge status: Home / Self Care | Payer: Self-pay | Source: Ambulatory Visit | Attending: Cardiovascular Disease | Admitting: Cardiovascular Disease

## 2018-04-15 ENCOUNTER — Encounter (HOSPITAL_COMMUNITY): Payer: Self-pay

## 2018-04-17 ENCOUNTER — Encounter (HOSPITAL_COMMUNITY)
Admission: RE | Admit: 2018-04-17 | Discharge: 2018-04-17 | Disposition: A | Discharge status: Home / Self Care | Payer: Self-pay | Source: Ambulatory Visit | Attending: Cardiovascular Disease | Admitting: Cardiovascular Disease

## 2018-04-20 ENCOUNTER — Encounter (HOSPITAL_COMMUNITY)
Admission: RE | Admit: 2018-04-20 | Discharge: 2018-04-20 | Disposition: A | Discharge status: Home / Self Care | Payer: Self-pay | Source: Ambulatory Visit | Attending: Cardiovascular Disease | Admitting: Cardiovascular Disease

## 2018-04-22 ENCOUNTER — Encounter (HOSPITAL_COMMUNITY): Payer: Self-pay

## 2018-04-24 ENCOUNTER — Encounter (HOSPITAL_COMMUNITY): Payer: Self-pay

## 2018-04-24 DIAGNOSIS — I2510 Atherosclerotic heart disease of native coronary artery without angina pectoris: Secondary | ICD-10-CM | POA: Insufficient documentation

## 2018-04-27 ENCOUNTER — Encounter (HOSPITAL_COMMUNITY)
Admission: RE | Admit: 2018-04-27 | Discharge: 2018-04-27 | Disposition: A | Discharge status: Home / Self Care | Payer: Medicare Other | Source: Ambulatory Visit | Attending: Cardiovascular Disease | Admitting: Cardiovascular Disease

## 2018-04-29 ENCOUNTER — Encounter (HOSPITAL_COMMUNITY)
Admission: RE | Admit: 2018-04-29 | Discharge: 2018-04-29 | Disposition: A | Discharge status: Home / Self Care | Payer: Self-pay | Source: Ambulatory Visit | Attending: Cardiovascular Disease | Admitting: Cardiovascular Disease

## 2018-05-01 ENCOUNTER — Encounter (HOSPITAL_COMMUNITY): Payer: Self-pay

## 2018-05-04 ENCOUNTER — Encounter (HOSPITAL_COMMUNITY)
Admission: RE | Admit: 2018-05-04 | Discharge: 2018-05-04 | Disposition: A | Discharge status: Home / Self Care | Payer: Self-pay | Source: Ambulatory Visit | Attending: Cardiovascular Disease | Admitting: Cardiovascular Disease

## 2018-05-06 ENCOUNTER — Encounter (HOSPITAL_COMMUNITY)
Admission: RE | Admit: 2018-05-06 | Discharge: 2018-05-06 | Disposition: A | Discharge status: Home / Self Care | Payer: Self-pay | Source: Ambulatory Visit | Attending: Cardiovascular Disease | Admitting: Cardiovascular Disease

## 2018-05-08 ENCOUNTER — Encounter (HOSPITAL_COMMUNITY): Payer: Self-pay

## 2018-05-11 ENCOUNTER — Encounter (HOSPITAL_COMMUNITY)
Admission: RE | Admit: 2018-05-11 | Discharge: 2018-05-11 | Disposition: A | Discharge status: Home / Self Care | Payer: Self-pay | Source: Ambulatory Visit | Attending: Cardiovascular Disease | Admitting: Cardiovascular Disease

## 2018-05-13 ENCOUNTER — Telehealth: Payer: Self-pay | Admitting: Cardiovascular Disease

## 2018-05-13 ENCOUNTER — Ambulatory Visit (HOSPITAL_COMMUNITY)
Admission: RE | Admit: 2018-05-13 | Discharge: 2018-05-13 | Disposition: A | Discharge status: Home / Self Care | Payer: Medicare Other | Source: Ambulatory Visit | Attending: Nurse Practitioner | Admitting: Nurse Practitioner

## 2018-05-13 ENCOUNTER — Encounter (HOSPITAL_COMMUNITY): Payer: Self-pay | Admitting: Nurse Practitioner

## 2018-05-13 ENCOUNTER — Telehealth (HOSPITAL_COMMUNITY): Payer: Self-pay | Admitting: *Deleted

## 2018-05-13 ENCOUNTER — Encounter (HOSPITAL_COMMUNITY): Payer: Self-pay

## 2018-05-13 VITALS — BP 116/66 | HR 72 | Ht 73.5 in | Wt 243.6 lb

## 2018-05-13 DIAGNOSIS — I4892 Unspecified atrial flutter: Secondary | ICD-10-CM | POA: Insufficient documentation

## 2018-05-13 DIAGNOSIS — Z7984 Long term (current) use of oral hypoglycemic drugs: Secondary | ICD-10-CM | POA: Insufficient documentation

## 2018-05-13 DIAGNOSIS — Z951 Presence of aortocoronary bypass graft: Secondary | ICD-10-CM | POA: Diagnosis not present

## 2018-05-13 DIAGNOSIS — I2510 Atherosclerotic heart disease of native coronary artery without angina pectoris: Secondary | ICD-10-CM | POA: Diagnosis not present

## 2018-05-13 DIAGNOSIS — Z87891 Personal history of nicotine dependence: Secondary | ICD-10-CM | POA: Insufficient documentation

## 2018-05-13 DIAGNOSIS — Z79899 Other long term (current) drug therapy: Secondary | ICD-10-CM | POA: Insufficient documentation

## 2018-05-13 DIAGNOSIS — I10 Essential (primary) hypertension: Secondary | ICD-10-CM | POA: Insufficient documentation

## 2018-05-13 DIAGNOSIS — Z880 Allergy status to penicillin: Secondary | ICD-10-CM | POA: Insufficient documentation

## 2018-05-13 DIAGNOSIS — I4891 Unspecified atrial fibrillation: Secondary | ICD-10-CM | POA: Diagnosis present

## 2018-05-13 DIAGNOSIS — Z7901 Long term (current) use of anticoagulants: Secondary | ICD-10-CM | POA: Insufficient documentation

## 2018-05-13 DIAGNOSIS — Z8249 Family history of ischemic heart disease and other diseases of the circulatory system: Secondary | ICD-10-CM | POA: Insufficient documentation

## 2018-05-13 DIAGNOSIS — E785 Hyperlipidemia, unspecified: Secondary | ICD-10-CM | POA: Insufficient documentation

## 2018-05-13 NOTE — Telephone Encounter (Signed)
Per PCP tele note pt has appt scheduled for a work in at 1:45 today.

## 2018-05-13 NOTE — Patient Instructions (Signed)
Your cardioversion is scheduled for : Tuesday August 27th at 2 p.m. [facility] and go to admitting at  12:30 p.m. Do Not eat or drink anything after midnight the night prior to your procedure. Take all your medications with a sip of water prior to arrival. Do NOT miss any doses of your blood thinner. You will NOT be able to drive home after your procedure.

## 2018-05-13 NOTE — Telephone Encounter (Signed)
New message   Pt c/o of Chest Pain: STAT if CP now or developed within 24 hours  1. Are you having CP right now? No   2. Are you experiencing any other symptoms (ex. SOB, nausea, vomiting, sweating)? Neck pain  3. How long have you been experiencing CP? 10 to 20 minutes of chest pressure on 05/12/2018  4. Is your CP continuous or coming and going?continous for 20 minutes   5. Have you taken Nitroglycerin? No  ?

## 2018-05-13 NOTE — Telephone Encounter (Signed)
PT STATES that last night pt had chest pressure, neck and shoulder blade pain at that time he decided not to go to the ER. He states that last time he had these sx his PCP told that he should have went for MI work-up. Pt declined 1st available APP appt. He verbalizes that he will go to the ER should these re-cur,and will call back to schedule appt after Dr Tresa Endokelly reviews this message and he would like for him to decide when she will "fit him in".

## 2018-05-13 NOTE — Telephone Encounter (Signed)
Pt cld reporting that he did have chest pain last night lasting about 20 mins.  He has put in a call to Dr. Landry DykeKelly's office as well, but now feels like he may be going in and out of rhythm.  Pt cld our office to get an appt today.  Pt scheduled for a work in at 1:45 today.

## 2018-05-14 ENCOUNTER — Other Ambulatory Visit (HOSPITAL_COMMUNITY): Payer: Self-pay | Admitting: Nurse Practitioner

## 2018-05-14 NOTE — Progress Notes (Signed)
Primary Care Physician: Marden Noble, MD Referring Physician: [facility] cardiac rehab   Derek Wells is a 84 y.o. male with a h/o CAD, s/p bypass in 2014,HTN, remote afib that was seen in the afib clinic, 04/15/17, for f/u of afib found in cardiac rehab yesterday. He remains in rate controlled afib today but is asymptomatic. He gives a h/o of a drop in h/h while on anticoagulation for afib in 2016 and anticoagulation was stopped. He had an upper endoscopy and colonoscopy without any source of bleeding. He reports that H/H stabilized with addition of iron. He  has a chadsvasc score of at least 4.  Pt believes that onset of afib may have been due to stress from a family situation and having family members in last week and drinking heavier amounts of alcohol. He feels well in rate controlled afuib and did not want any further intervention. He was started on anticoagulation.  F/u in afib clinic 04/23/17. Still is asymptomatic in afb but has noted heart rate to be running in low 50's/upper 40's at times. He has been back to cardiac rehab and did well exercising. Slightly sluggish when heart rate is low. CBC pending today with h/o prior anemia without bleeding source. No obvious bleeding with start of eliquis.  F/u in afib clinic 8/17 at pt request. He reports that he can not take a good deep breath and he is waking up at night and can not get back to sleep. Fluid status is normal, actually down 2 lbs. He has not noted any bleeding. He remains in afib at 62 bpm. Stat CBC with normal blood count. He feels that the afib may be getting to him and is now willing to have a cardioversion. He had one in the remote past which did keep him in rhythm for some time.  F/u after cardioversion, he had successful DCCV and is holding in SR. He feels better with breathing easier and sleeping better at night. Wife states that he still drinks more alcohol than he should. He can return to cardiac rehab. He has no chest tightness with  exercising at Cardiac rehab but does have some occasional chest tightness with walking with his wife. He says that this is a chronic condition  and that he has discussed with  Dr. Tresa Endo of these symptoms in the past. Last stress test in 10/17 and was low risk.  F/u in afib clinic 05/13/18. He was going up the steps to bed last pm when he was aware of a full feeling in his throat and upper chest with some radiation to upper Left shoulder. He was also aware of some palpitations. He got ready for better and after getting in bed the symptoms improved.  First episode since last year with palpitations. He felt he was out of rhythm and asked to be seen today. Ekg  confirms that he is in a rate controlled atrial flutter. He is still drinking too much alcohol usually at least 2 drinks a night. HE played golf yesterday without any symptoms.  Today, he denies symptoms of palpitations, chest pain, shortness of breath, orthopnea, PND, lower extremity edema, dizziness, presyncope, syncope, or neurologic sequela. The patient is tolerating medications without difficulties and is otherwise without complaint today.   Past Medical History:  Diagnosis Date  . CAD (coronary artery disease), severe needing CABG 01/19/2013  . Chest pain    2D ECHO, 06/16/2012 - EF 50-55%, borderline low left ventricular systolic function  . Coronary artery disease   .  Dyspnea on exertion    LEXISCAN, 06/17/2012 - fixed inferior bowel artifact and basal septal defect, probably related to IVCD/incomplete LBBB, no reversible ischemia, post-stress EF 66%  . History of AAA (abdominal aortic aneurysm) repair    ABDOMINAL AORTIC DOPPLER, 06/16/2013 - normal  . HTN (hypertension) 01/19/2013  . Hyperlipidemia LDL goal < 70 01/19/2013  . Hypertension   . S/P CABG x 3 01/19/2013   Past Surgical History:  Procedure Laterality Date  . CARDIAC CATHETERIZATION  01/15/2013   Severe coronary obstructive disease with 60-70% ostial left main and 90-85% distal  LAD stenosis and mild-moderate stenoses in RCA, recommended for CABG  . CARDIOVERSION N/A 11/12/2013   Procedure: CARDIOVERSION;  Surgeon: Lennette Bihari, MD;  Location: [facility] ENDOSCOPY;  Service: Cardiovascular;  Laterality: N/A;  11:16 Lido 60mg , Propofol 120mg ,IV 11:19 synch 100 joules conversion conversion to NSR....  . CARDIOVERSION N/A 05/13/2017   Procedure: CARDIOVERSION;  Surgeon: Elease Hashimoto Deloris Ping, MD;  Location: [facility] ENDOSCOPY;  Service: Cardiovascular;  Laterality: N/A;  . CORONARY ARTERY BYPASS GRAFT N/A 01/15/2013   Procedure: Coronary Artery Bypass Graft times three using Right Greater Saphenous Vein Graft harvested endoscopically and Left Internal Mammary Artery and;  Surgeon: Alleen Borne, MD;  Location: [facility] OR;  Service: Open Heart Surgery;  Laterality: N/A;  . INTRAOPERATIVE TRANSESOPHAGEAL ECHOCARDIOGRAM  01/15/2013   Procedure: INTRAOPERATIVE TRANSESOPHAGEAL ECHOCARDIOGRAM;  Surgeon: Alleen Borne, MD;  Location: [facility] OR;  Service: Open Heart Surgery;;  . LEFT HEART CATHETERIZATION WITH CORONARY ANGIOGRAM N/A 01/15/2013   Procedure: LEFT HEART CATHETERIZATION WITH CORONARY ANGIOGRAM;  Surgeon: Lennette Bihari, MD;  Location: [facility] CATH LAB;  Service: Cardiovascular;  Laterality: N/A;  . VASCULAR SURGERY      Current Outpatient Medications  Medication Sig Dispense Refill  . amLODipine (NORVASC) 5 MG tablet TAKE 1 TABLET BY MOUTH EVERY DAY (Patient taking differently: Take 5 mg by mouth daily. ) 90 tablet 0  . atorvastatin (LIPITOR) 40 MG tablet TAKE 1 TABLET BY MOUTH ONCE DAILY (Patient taking differently: Take 40 mg by mouth at bedtime. ) 90 tablet 3  . ELIQUIS 5 MG TABS tablet TAKE 1 TABLET BY MOUTH TWICE DAILY (Patient taking differently: Take 5 mg by mouth 2 (two) times daily. ) 60 tablet 6  . irbesartan (AVAPRO) 150 MG tablet Take 1 tablet (150 mg total) by mouth daily. 90 tablet 1  . metFORMIN (GLUCOPHAGE) 500 MG tablet Take 500 mg by mouth daily with breakfast.     . metoprolol  succinate (TOPROL-XL) 25 MG 24 hr tablet TAKE 1/2 TABLET(12.5 MG) BY MOUTH DAILY (Patient taking differently: Take 12.5 mg by mouth daily. ) 30 tablet 0  . Multiple Vitamin (MULTIVITAMIN WITH MINERALS) TABS Take 1 tablet by mouth daily.     No current facility-administered medications for this encounter.     Allergies  Allergen Reactions  . Penicillins Rash    Has patient had a PCN reaction causing immediate rash, facial/tongue/throat swelling, SOB or lightheadedness with hypotension: no Has patient had a PCN reaction causing severe rash involving mucus membranes or skin necrosis: no Has patient had a PCN reaction that required hospitalization: no Has patient had a PCN reaction occurring within the last 10 years: yes If all of the above answers are "NO", then may proceed with Cephalosporin use.     Social History   Socioeconomic History  . Marital status: Married    Spouse name: Not on file  . Number of children: Not on file  .  Years of education: Not on file  . Highest education level: Not on file  Occupational History  . Not on file  Social Needs  . Financial resource strain: Not on file  . Food insecurity:    Worry: Not on file    Inability: Not on file  . Transportation needs:    Medical: Not on file    Non-medical: Not on file  Tobacco Use  . Smoking status: Former Smoker    Types: Cigars    Last attempt to quit: 01/16/2013    Years since quitting: 5.3  . Smokeless tobacco: Never Used  Substance and Sexual Activity  . Alcohol use: Yes    Alcohol/week: 14.0 standard drinks    Types: 14 Standard drinks or equivalent per week  . Drug use: No  . Sexual activity: Not on file  Lifestyle  . Physical activity:    Days per week: Not on file    Minutes per session: Not on file  . Stress: Not on file  Relationships  . Social connections:    Talks on phone: Not on file    Gets together: Not on file    Attends religious service: Not on file    Active member of club or  organization: Not on file    Attends meetings of clubs or organizations: Not on file    Relationship status: Not on file  . Intimate partner violence:    Fear of current or ex partner: Not on file    Emotionally abused: Not on file    Physically abused: Not on file    Forced sexual activity: Not on file  Other Topics Concern  . Not on file  Social History Narrative  . Not on file    Family History  Problem Relation Age of Onset  . Hyperlipidemia Mother   . Heart disease Mother   . Heart disease Father   . Hypertension Father   . Heart disease Brother     ROS- All systems are reviewed and negative except as per the HPI above  Physical Exam: Vitals:   05/13/18 1348  BP: 116/66  Pulse: 72  Weight: 110.5 kg  Height: 6' 1.5" (1.867 m)   Wt Readings from Last 3 Encounters:  05/13/18 110.5 kg  11/05/17 110.7 kg  07/03/17 109.6 kg    Labs: Lab Results  Component Value Date   NA 140 07/21/2017   K 4.3 07/21/2017   CL 102 07/21/2017   CO2 21 07/21/2017   GLUCOSE 106 (H) 07/21/2017   BUN 16 07/21/2017   CREATININE 0.86 07/21/2017   CALCIUM 9.3 07/21/2017   MG 2.0 10/12/2013   Lab Results  Component Value Date   INR 1.89 (H) 11/08/2013   Lab Results  Component Value Date   CHOL 159 07/21/2017   HDL 40 07/21/2017   LDLCALC 91 07/21/2017   TRIG 142 07/21/2017   CBC WNL  GEN- The patient is well appearing, alert and oriented x 3 today.   Head- normocephalic, atraumatic Eyes-  Sclera clear, conjunctiva pink Ears- hearing intact Oropharynx- clear Neck- supple, no JVP Lymph- no cervical lymphadenopathy Lungs- Clear to ausculation bilaterally, normal work of breathing Heart-irregular rate and rhythm, no murmurs, rubs or gallops, PMI not laterally displaced GI- soft, NT, ND, + BS Extremities- no clubbing, cyanosis, or edema MS- no significant deformity or atrophy Skin- no rash or lesion Psych- euthymic mood, full affect Neuro- strength and sensation are  intact  EKG- SR with first degree  AV block Pr int  286 ms, qrs int 118 ms, qtc 433 ms Epic records reviewed    Assessment and Plan: 1. Paroxysmal atrial flutter Now in rate controlled atrial flutter Last successful cardioversion 05/13/17 He has not missed any anticoagulation  Continue metoprolol at 25 1/2 tab qd He does have a chadsvasc score of 4 Continue eliquis 5 mg bid, no missed doses Advised again to  decrease alcohol to no more than 2 drinks a week Will schedule for DCCV, if afib returns within a short period of time, needs to consider AAD therapy    F/u in afib clinic one week F/u with  Dr. Tresa Endo as scheduled  [facility]. [facility] [address] [place], [place] [address] [phone]

## 2018-05-14 NOTE — Telephone Encounter (Signed)
Patient seen in Afib clinic 8/21-scheduled for cardioversion 8/27

## 2018-05-15 ENCOUNTER — Encounter (HOSPITAL_COMMUNITY)
Admission: RE | Admit: 2018-05-15 | Discharge: 2018-05-15 | Disposition: A | Discharge status: Home / Self Care | Payer: Medicare Other | Source: Ambulatory Visit | Attending: Cardiovascular Disease | Admitting: Cardiovascular Disease

## 2018-05-15 NOTE — Telephone Encounter (Signed)
thx for letting me know 

## 2018-05-18 ENCOUNTER — Encounter (HOSPITAL_COMMUNITY): Payer: Self-pay

## 2018-05-18 ENCOUNTER — Other Ambulatory Visit (HOSPITAL_COMMUNITY): Payer: Self-pay | Admitting: Nurse Practitioner

## 2018-05-19 ENCOUNTER — Encounter (HOSPITAL_COMMUNITY): Payer: Self-pay | Admitting: Certified Registered Nurse Anesthetist

## 2018-05-19 ENCOUNTER — Ambulatory Visit (HOSPITAL_COMMUNITY): Admission: RE | Admit: 2018-05-19 | Payer: Medicare Other | Source: Ambulatory Visit | Admitting: Cardiology

## 2018-05-19 ENCOUNTER — Encounter (HOSPITAL_COMMUNITY): Admission: RE | Payer: Self-pay | Source: Ambulatory Visit

## 2018-05-19 ENCOUNTER — Encounter (HOSPITAL_COMMUNITY): Payer: Medicare Other | Admitting: Nurse Practitioner

## 2018-05-19 ENCOUNTER — Ambulatory Visit (HOSPITAL_COMMUNITY)
Admission: RE | Admit: 2018-05-19 | Discharge: 2018-05-19 | Disposition: A | Discharge status: Home / Self Care | Payer: Medicare Other | Source: Ambulatory Visit | Attending: Nurse Practitioner | Admitting: Nurse Practitioner

## 2018-05-19 ENCOUNTER — Telehealth: Payer: Self-pay | Admitting: Cardiovascular Disease

## 2018-05-19 DIAGNOSIS — I2510 Atherosclerotic heart disease of native coronary artery without angina pectoris: Secondary | ICD-10-CM

## 2018-05-19 DIAGNOSIS — I491 Atrial premature depolarization: Secondary | ICD-10-CM | POA: Diagnosis not present

## 2018-05-19 DIAGNOSIS — I4892 Unspecified atrial flutter: Secondary | ICD-10-CM | POA: Insufficient documentation

## 2018-05-19 DIAGNOSIS — I4891 Unspecified atrial fibrillation: Secondary | ICD-10-CM | POA: Diagnosis not present

## 2018-05-19 DIAGNOSIS — I440 Atrioventricular block, first degree: Secondary | ICD-10-CM | POA: Insufficient documentation

## 2018-05-19 DIAGNOSIS — I454 Nonspecific intraventricular block: Secondary | ICD-10-CM | POA: Diagnosis not present

## 2018-05-19 DIAGNOSIS — Z79899 Other long term (current) drug therapy: Secondary | ICD-10-CM

## 2018-05-19 DIAGNOSIS — E785 Hyperlipidemia, unspecified: Secondary | ICD-10-CM

## 2018-05-19 DIAGNOSIS — Z951 Presence of aortocoronary bypass graft: Secondary | ICD-10-CM

## 2018-05-19 DIAGNOSIS — D509 Iron deficiency anemia, unspecified: Secondary | ICD-10-CM

## 2018-05-19 DIAGNOSIS — I10 Essential (primary) hypertension: Secondary | ICD-10-CM

## 2018-05-19 SURGERY — CARDIOVERSION
Anesthesia: General

## 2018-05-19 NOTE — Telephone Encounter (Signed)
New message:    Pt is calling and states he was seen at the afib clinic to have a cardioversion. He states he is still having trouble and just does not feel well. Pt would like and appt to see Dr. Tresa EndoKelly.

## 2018-05-19 NOTE — Progress Notes (Signed)
Pt in for EKG prior to cardioversion. It appears that he is in SR with PAC's, PVC's today . Cardioversion cancelled. He is c/o fo feeling weak and fuzzy headed despite being back in rhythm. . Will request f/u with Dr. Tresa EndoKelly in 1-2 weeks.

## 2018-05-19 NOTE — Telephone Encounter (Signed)
Patient scheduled 9/6 to see Dr. Tresa EndoKelly.   Also request to have lab work completed prior to.    Due for routine labs-lab orders placed.   Patient verbalized understanding

## 2018-05-20 ENCOUNTER — Encounter (HOSPITAL_COMMUNITY)
Admission: RE | Admit: 2018-05-20 | Discharge: 2018-05-20 | Disposition: A | Discharge status: Home / Self Care | Payer: Medicare Other | Source: Ambulatory Visit | Attending: Cardiovascular Disease | Admitting: Cardiovascular Disease

## 2018-05-21 ENCOUNTER — Encounter (HOSPITAL_COMMUNITY): Payer: Self-pay | Admitting: *Deleted

## 2018-05-22 ENCOUNTER — Encounter (HOSPITAL_COMMUNITY)
Admission: RE | Admit: 2018-05-22 | Discharge: 2018-05-22 | Disposition: A | Discharge status: Home / Self Care | Payer: Medicare Other | Source: Ambulatory Visit | Attending: Cardiovascular Disease | Admitting: Cardiovascular Disease

## 2018-05-26 DIAGNOSIS — I2510 Atherosclerotic heart disease of native coronary artery without angina pectoris: Secondary | ICD-10-CM | POA: Diagnosis not present

## 2018-05-26 DIAGNOSIS — Z79899 Other long term (current) drug therapy: Secondary | ICD-10-CM | POA: Diagnosis not present

## 2018-05-26 DIAGNOSIS — I4892 Unspecified atrial flutter: Secondary | ICD-10-CM | POA: Diagnosis not present

## 2018-05-26 DIAGNOSIS — Z951 Presence of aortocoronary bypass graft: Secondary | ICD-10-CM | POA: Diagnosis not present

## 2018-05-26 LAB — CBC
HEMOGLOBIN: 14.7 g/dL (ref 13.0–17.7)
Hematocrit: 43.6 % (ref 37.5–51.0)
MCH: 30.2 pg (ref 26.6–33.0)
MCHC: 33.7 g/dL (ref 31.5–35.7)
MCV: 90 fL (ref 79–97)
Platelets: 267 10*3/uL (ref 150–450)
RBC: 4.86 x10E6/uL (ref 4.14–5.80)
RDW: 14.2 % (ref 12.3–15.4)
WBC: 7.9 10*3/uL (ref 3.4–10.8)

## 2018-05-26 LAB — COMPREHENSIVE METABOLIC PANEL
A/G RATIO: 1.9 (ref 1.2–2.2)
ALT: 15 IU/L (ref 0–44)
AST: 15 IU/L (ref 0–40)
Albumin: 4.4 g/dL (ref 3.5–4.7)
Alkaline Phosphatase: 66 IU/L (ref 39–117)
BUN/Creatinine Ratio: 16 (ref 10–24)
BUN: 15 mg/dL (ref 8–27)
Bilirubin Total: 0.8 mg/dL (ref 0.0–1.2)
CALCIUM: 9.6 mg/dL (ref 8.6–10.2)
CO2: 23 mmol/L (ref 20–29)
CREATININE: 0.96 mg/dL (ref 0.76–1.27)
Chloride: 105 mmol/L (ref 96–106)
GFR calc Af Amer: 84 mL/min/{1.73_m2} (ref >59)
GFR, EST NON AFRICAN AMERICAN: 73 mL/min/{1.73_m2} (ref >59)
Globulin, Total: 2.3 g/dL (ref 1.5–4.5)
Glucose: 103 mg/dL — ABNORMAL HIGH (ref 65–99)
Potassium: 4.5 mmol/L (ref 3.5–5.2)
Sodium: 140 mmol/L (ref 134–144)
TOTAL PROTEIN: 6.7 g/dL (ref 6.0–8.5)

## 2018-05-26 LAB — LIPID PANEL
CHOLESTEROL TOTAL: 143 mg/dL (ref 100–199)
Chol/HDL Ratio: 3.9 ratio (ref 0.0–5.0)
HDL: 37 mg/dL — ABNORMAL LOW (ref >39)
LDL CALC: 71 mg/dL (ref 0–99)
Triglycerides: 173 mg/dL — ABNORMAL HIGH (ref 0–149)
VLDL CHOLESTEROL CAL: 35 mg/dL (ref 5–40)

## 2018-05-26 LAB — TSH: TSH: 2.41 u[IU]/mL (ref 0.450–4.500)

## 2018-05-27 ENCOUNTER — Ambulatory Visit (HOSPITAL_COMMUNITY): Payer: Medicare Other | Admitting: Nurse Practitioner

## 2018-05-27 ENCOUNTER — Encounter (HOSPITAL_COMMUNITY)
Admission: RE | Admit: 2018-05-27 | Discharge: 2018-05-27 | Disposition: A | Discharge status: Home / Self Care | Payer: Self-pay | Source: Ambulatory Visit | Attending: Cardiovascular Disease | Admitting: Cardiovascular Disease

## 2018-05-27 DIAGNOSIS — I2510 Atherosclerotic heart disease of native coronary artery without angina pectoris: Secondary | ICD-10-CM | POA: Insufficient documentation

## 2018-05-29 ENCOUNTER — Encounter: Payer: Self-pay | Admitting: Cardiovascular Disease

## 2018-05-29 ENCOUNTER — Encounter (HOSPITAL_COMMUNITY): Payer: Self-pay

## 2018-05-29 ENCOUNTER — Ambulatory Visit: Payer: Medicare Other | Admitting: Cardiovascular Disease

## 2018-05-29 VITALS — BP 120/80 | HR 72 | Ht 73.5 in | Wt 243.6 lb

## 2018-05-29 DIAGNOSIS — Z9889 Other specified postprocedural states: Secondary | ICD-10-CM

## 2018-05-29 DIAGNOSIS — I350 Nonrheumatic aortic (valve) stenosis: Secondary | ICD-10-CM | POA: Diagnosis not present

## 2018-05-29 DIAGNOSIS — I4892 Unspecified atrial flutter: Secondary | ICD-10-CM

## 2018-05-29 DIAGNOSIS — I2510 Atherosclerotic heart disease of native coronary artery without angina pectoris: Secondary | ICD-10-CM | POA: Diagnosis not present

## 2018-05-29 DIAGNOSIS — Z8679 Personal history of other diseases of the circulatory system: Secondary | ICD-10-CM

## 2018-05-29 DIAGNOSIS — Z951 Presence of aortocoronary bypass graft: Secondary | ICD-10-CM

## 2018-05-29 DIAGNOSIS — I440 Atrioventricular block, first degree: Secondary | ICD-10-CM

## 2018-05-29 DIAGNOSIS — Z7901 Long term (current) use of anticoagulants: Secondary | ICD-10-CM

## 2018-05-29 MED ORDER — DILTIAZEM HCL ER COATED BEADS 120 MG PO CP24: 120.0000 mg | ORAL_CAPSULE | Freq: Every day | ORAL | 3 refills | Status: DC

## 2018-05-29 NOTE — Patient Instructions (Signed)
Medication Instructions:  STOP amlodipine  START Cardizem CD 120 mg daily at bedtime  Testing/Procedures: Your physician has requested that you have an echocardiogram. Echocardiography is a painless test that uses sound waves to create images of your heart. It provides your doctor with information about the size and shape of your heart and how well your heart's chambers and valves are working. This procedure takes approximately one hour. There are no restrictions for this procedure.  This will be done at our [facility] location:  [address]  Follow-Up: After echo with Dr. Tresa Endo  Any Other Special Instructions Will Be Listed Below (If Applicable).     If you need a refill on your cardiac medications before your next appointment, please call your pharmacy.

## 2018-05-29 NOTE — Progress Notes (Signed)
Patient ID: Derek Wells, male   DOB: Nov 20, 1932, 84 y.o.   MRN: [id number]     HPI: Derek Wells is a 84 y.o. male who presents to the office today for a followup cardiology evaluation after developing another episode paroxysmal atrial flutter.  Mr. Derek Wells has a history of hypertension, hyperlipidemia, and is s/p abdominal aortic aneurysm repair in April 2010. I had seen him on 01/14/2013 at which time he had experienced symptoms worrisome for unstable angina. Previously, in September 2013 he had undergone a nuclear perfusion study  which was essentially normal although did show mild diaphragmatic attenuation. He also been found to have a atherogenic dyslipidemia pattern with reference to his lipid panel. Catheterization the following day on 01/15/2013 revealed life-threatening coronary anatomy with 60-70% ostial left main stenosis, 90-95% distal left main stenosis, 95-99% ostial LAD stenosis, and mild/moderate stenoses in the RCA. That same day he was successfully operated by Dr. Arvid Right and underwent CABG x3 with a LIMA to the LAD, SVG to the OM, and SVG to the PDA. He had a unremarkable postoperative course and was discharged 4 days later.  He saw [name] on 01/27/13 and was doing well. I saw him in followup on 03/02/2013 and he  participating  in cardiac rehabilitation. When I saw him in September 2014, he was in sinus rhythm and has and had resumed a good level of activity.   In January 2015, he was found to be in atrial flutter at cardiac rehabilitation. He was unaware of his heart rhythm being abnormal. He had developed a URI over the holidays prior to that and had just completed a steroid dose pack. Xarelto 20 mg was added to his medical regimen. He did undergo subsequent blood work on January 20 which showed normal renal function and normal electrolytes. Liver function studies were normal. Hemoglobin was 13.7 hematocrit 41.7. Platelets were normal. TSH was normal at 2.63. Magnesium was normal  at 2.0.  He underwent successful cardioversion for his atrial flutter on 11/12/2013 and he cardioverted to sinus rhythm at 100 J.  Since his cardioversion, he is unaware of any recurrent rhythm disturbance.   He is in the maintenance phase of cardiac rehabilitation.  When I  saw him in June 2015, he told me that he had noticed several episodes of a vague chest sensation particularly when he rolls his garbage can back up a hill at home.    On 03/18/2014, he underwent a nuclear perfusion study, which showed reduced exercise capacity with a workload of only 5.9.  He did experience mild shortness of breath.  There was no significant ST depression, but he had developed transient left bundle branch block.  The scan was interpreted at low as low risk.  Ejection fraction was 65% without wall motion abnormalities.  An echo Doppler study showed an EF of 60-65%.  There were no regional wall motion abnormalities.  He had mild biatrial enlargement.  Mr. Derek Wells currently is playing golf at least 2 times per week and occasionally 3.  He is in the maintenance phase of cardiac rehabilitation.  He does admit to some weight gain.  He denies any episodes of chest pain, particularly during the cardiac rehabilitation program.  He has noticed some mild shortness of breath with deep breathing and some vague pressure in his chest which is short-lived walking up hills when he walks fast with his wife.  He has noticed some mild shortness of breath walking up steep hills in the golf  course without chest pressure.  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 2016.  When I saw him in 2017 he was anemic and had significantly reduced iron saturation at 6%, and his serum ferritin was 7.  I referred him for GI evaluation and underwent both colonoscopy and endoscopy by Dr. [name] and 4 polyps were removed from his colon.  He was not having any active bleeding.  He does note improved energy.  He had taken Niferex  initially and transitioned to over-the-counter iron. He has more energy.  He is continuing with cardiac rehabilitation.  He underwent a follow-up abdominal ultrasound on 06/09/2015 which showed a patent aortobifemoral bypass graft without focal dilatation or stenosis.   In the late summer of 2017 he was at the beach for several weeks and played golf heat without chest pain.  However with fast walking he noted chest pressure.  For the past month he has had a cough.  He has a ventral hernia.  When I saw him, his ECG showed progressive PR prolongation.  I reduced his Lopressor back to 25 mg daily and added amlodipine 5 mg blood pressure and his chest tightness.  I scheduled him for a nuclear perfusion study which was done on 07/23/2016.  Ejection fraction was 62%.  There were no ECG changes ischemia, but there was a rare PVC.  He had normal perfusion without scar or ischemia.  His follow-up laboratory showed significant improvement with his iron saturation, now at 27%.  Lipid studies revealed a total cholesterol 164, triglycerides 122, HDL 40, and LDL 100.    When I last saw him in January 2018, I suggested the addition of Zetia to his atorvastatin.  In the past.  He did not tolerate 80 mg, atorvastatin, and also had myalgias on 40 mg.  I also discussed plaque regression studies with Repatha.  He developed recurrent atrial fibrillation and had multiple evaluations in the A. fib clinic commencing in July 2018.  Ultimately, on 05/13/2017, he underwent successful cardioversion with restoration of sinus rhythm.  He saw [name] in follow-up of his cardioversion and was maintaining sinus rhythm.  There was discussion concerning his EtOH use and reduction in consumption.  He typically has at least 1 or more vodka every day.    When I last saw him in February 2019 he was continuing to feel well and denied any recurrent episodes of atrial fibrillation.  His last echo Doppler study in October 2018 showed normal LV  systolic and diastolic function.  He had biatrial enlargement with mild aortic valve stenosis, and mean gradient of 14 and peak gradient of 25.  In follow-up abdominal aortic Doppler study.  The largest aortic measurement was 2.7 cm.  He is continued to be active.  He was playing golf several days per week and was continue to participate in cardiac rehab.  On the evening of May 12, 2018 he was going up steps to bed developed an episode of full sensation in his throat chest with some radiate this was associated with palpitations.  Symptoms lasted approximately 10 minutes.  He was seen in the following morning relation clinic and was found to be in atrial flutter with a controlled ventricular rate.  He was scheduled to undergo DC cardioversion but upon arrival for his cardioversion procedure his ECG showed that he had converted back to sinus rhythm and had PACs.  He admits to occasional lightheadedness and sensation of head fog.  He  denies any exertional chest tightness.  Continues to drink occasional vodka but admits that his drinking is less.  He states that he is sleeping well.  He only rarely snores.  He denies daytime sleepiness.  He admits to increased fatigue.  He presents for reevaluation.  Past Medical History:  Diagnosis Date  . CAD (coronary artery disease), severe needing CABG 01/19/2013  . Chest pain    2D ECHO, 06/16/2012 - EF 50-55%, borderline low left ventricular systolic function  . Coronary artery disease   . Dyspnea on exertion    LEXISCAN, 06/17/2012 - fixed inferior bowel artifact and basal septal defect, probably related to IVCD/incomplete LBBB, no reversible ischemia, post-stress EF 66%  . History of AAA (abdominal aortic aneurysm) repair    ABDOMINAL AORTIC DOPPLER, 06/16/2013 - normal  . HTN (hypertension) 01/19/2013  . Hyperlipidemia LDL goal < 70 01/19/2013  . Hypertension   . S/P CABG x 3 01/19/2013    Past Surgical History:  Procedure Laterality Date  . CARDIAC  CATHETERIZATION  01/15/2013   Severe coronary obstructive disease with 60-70% ostial left main and 90-85% distal LAD stenosis and mild-moderate stenoses in RCA, recommended for CABG  . CARDIOVERSION N/A 11/12/2013   Procedure: CARDIOVERSION;  Surgeon: Troy Sine, MD;  Location[facility] ENDOSCOPY;  Service: Cardiovascular;  Laterality: N/A;  11:16 Lido 110m, Propofol 1212mIV 11:19 synch 100 joules conversion conversion to NSR....  . CARDIOVERSION N/A 05/13/2017   Procedure: CARDIOVERSION;  Surgeon: NaAcie FredricksonhWonda ChengMD;  [facility];  Service: Cardiovascular;  Laterality: N/A;  . CORONARY ARTERY BYPASS GRAFT N/A 01/15/2013   Procedure: Coronary Artery Bypass Graft times three using Right Greater Saphenous Vein Graft harvested endoscopically and Left Internal Mammary Artery and;  Surgeon: BrGaye PollackMD;  [facility];  Service: Open Heart Surgery;  Laterality: N/A;  . INTRAOPERATIVE TRANSESOPHAGEAL ECHOCARDIOGRAM  01/15/2013   Procedure: INTRAOPERATIVE TRANSESOPHAGEAL ECHOCARDIOGRAM;  Surgeon: BrGaye PollackMD;  [facility];  Service: Open Heart Surgery;;  . LEFT HEART CATHETERIZATION WITH CORONARY ANGIOGRAM N/A 01/15/2013   Procedure: LEFT HEART CATHETERIZATION WITH CORONARY ANGIOGRAM;  Surgeon: ThTroy SineMD;  [facility];  Service: Cardiovascular;  Laterality: N/A;  . VASCULAR SURGERY      Allergies  Allergen Reactions  . Penicillins Rash    Has patient had a PCN reaction causing immediate rash, facial/tongue/throat swelling, SOB or lightheadedness with hypotension: no Has patient had a PCN reaction causing severe rash involving mucus membranes or skin necrosis: no Has patient had a PCN reaction that required hospitalization: no Has patient had a PCN reaction occurring within the last 10 years: yes If all of the above answers are "NO", then may proceed with Cephalosporin use.     Current Outpatient Medications  Medication Sig Dispense Refill  .  atorvastatin (LIPITOR) 40 MG tablet TAKE 1 TABLET BY MOUTH ONCE DAILY (Patient taking differently: Take 40 mg by mouth at bedtime. ) 90 tablet 3  . ELIQUIS 5 MG TABS tablet TAKE 1 TABLET BY MOUTH TWICE DAILY (Patient taking differently: Take 5 mg by mouth 2 (two) times daily. ) 60 tablet 6  . irbesartan (AVAPRO) 150 MG tablet Take 1 tablet (150 mg total) by mouth daily. 90 tablet 1  . metFORMIN (GLUCOPHAGE) 500 MG tablet Take 500 mg by mouth daily with breakfast.     . metoprolol succinate (TOPROL-XL) 25 MG 24 hr tablet TAKE 1/2 TABLET(12.5 MG) BY MOUTH DAILY (Patient taking differently: Take 12.5 mg  by mouth daily. ) 30 tablet 0  . Multiple Vitamin (MULTIVITAMIN WITH MINERALS) TABS Take 1 tablet by mouth daily.    Marland Kitchen diltiazem (CARDIZEM CD) 120 MG 24 hr capsule Take 1 capsule (120 mg total) by mouth at bedtime. 90 capsule 3   No current facility-administered medications for this visit.     Socially he is re-married. His first wife of 42 years passed away. He previously had smoked cigars prior to his bypass surgery but has not had any tobacco use since.  He continues to play golf at least 2 times per week.  He does a lot of leg exercise with cardiac rehabilitation.  ROS General: Negative; No fevers, chills, or night sweats;  HEENT: Negative; No changes in vision or hearing, sinus congestion, difficulty swallowing Pulmonary: URI symptoms during the winter; No cough, wheezing, shortness of breath, hemoptysis Cardiovascular: Mild shortness of breath with uphill walking; see history of present illness GI: Negative; No nausea, vomiting, diarrhea, or abdominal pain GU: Negative; No dysuria, hematuria, or difficulty voiding Musculoskeletal: Negative; no myalgias, joint pain, or weakness Hematologic/Oncology: Negative; no easy bruising, bleeding Endocrine: Negative; no heat/cold intolerance; no diabetes Neuro: Negative; no changes in balance, headaches Skin: Removal of small left forearm  melanoma. Psychiatric: Negative; No behavioral problems, depression Sleep: Negative; No snoring, daytime sleepiness, hypersomnolence, bruxism, restless legs, hypnogognic hallucinations, no cataplexy Other comprehensive 14 point system review is negative.   PE BP 120/80   Pulse 72   Ht 6' 1.5" (1.867 m)   Wt 243 lb 9.6 oz (110.5 kg)   BMI 31.70 kg/m    Repeat blood pressure was 130/74  Wt Readings from Last 3 Encounters:  05/29/18 243 lb 9.6 oz (110.5 kg)  05/13/18 243 lb 9.6 oz (110.5 kg)  11/05/17 244 lb (110.7 kg)   General: Alert, oriented, no distress.  Skin: normal turgor, no rashes, warm and dry HEENT: Normocephalic, atraumatic. Pupils equal round and reactive to light; sclera anicteric; extraocular muscles intact; Fundi ** Nose without nasal septal hypertrophy Mouth/Parynx benign; Mallinpatti scale Neck: No JVD, no carotid bruits; normal carotid upstroke Lungs: clear to ausculatation and percussion; no wheezing or rales Chest wall: without tenderness to palpitation Heart: PMI not displaced, RRR, s1 s2 normal, 2/6 systolic murmur in the aortic area consistent with aortic stenosis, no diastolic murmur, no rubs, gallops, thrills, or heaves Abdomen: Diastases recti with possible ventral hernia;soft, nontender; no hepatosplenomehaly, BS+; abdominal aorta nontender and not dilated by palpation. Back: no CVA tenderness Pulses 2+ Musculoskeletal: full range of motion, normal strength, no joint deformities Extremities: no clubbing cyanosis or edema, Homan's sign negative  Neurologic: grossly nonfocal; Cranial nerves grossly wnl Psychologic: Normal mood and affect  ECG (independently read by me): Sinus rhythm with first-degree AV block at 72 bpm with a PR interval of 312 ms.  Occasional PACs.  February 2019 ECG (independently read by me): Sinus rhythm at 63 bpm.  First-degree AV block with a PR interval at 344 ms.  PAC.  Nonspecific interventricular conduction delay.  October  2018 ECG (independently read by me): Sinus rhythm with first-degree AV block with a PR interval of 314 ms.  Nonspecific interventricular block.  QTc interval 431  January 2018 ECG (independently read by me): Normal sinus rhythm with first-degree AV block.  PR interval 300 ms.  Nonspecific interventricular conduction delay.  October 2017 ECG (independently read by me): Normal sinus rhythm at 66 bpm.  First-degree block with a PR interval at 290 ms.  Nonspecific interventricular block.  03/02/2015 ECG (independently read by me): Sinus rhythm at 75 bpm.  Occasional unifocal PVCs.  Nonspecific interventricular block.  Prior October 2015 ECG (independently read by me): Normal sinus rhythm at 67 beats per minute.  First degree A-V block with PR interval at 290 ms.  QTc interval 439 ms.  No ST segment changes.  Prior June 2015 ECG (independently read by me): Normal sinus rhythm at 68 beats per minute with first degree AV block with a PR interval at 284 ms.  QTc interval 435 ms.  No significant ST-T changes.  T-wave inversion in aVL  ECG today (independently read by me):  Normal sinus rhythm at 71 beats per minute. First create a block with PR interval at 240 ms. QTc interval normal at 4-5 ms.  Prior 10/27/13 ECG (independently read by me): Atrial flutter with variable rate but predominantly 4-1 with occasional 3-1 block; average heart rate 70 beats per minute  Prior ECG from 06/04/2013: Sinus rhythm with first-degree AV block. Nonspecific interventricular block  LABS: BMP Latest Ref Rng & Units 05/26/2018 07/21/2017 05/09/2017  Glucose 65 - 99 mg/dL 103(H) 106(H) 134(H)  BUN 8 - 27 mg/dL '15 16 15  ' Creatinine 0.76 - 1.27 mg/dL 0.96 0.86 0.95  BUN/Creat Ratio 10 - '24 16 19 ' -  Sodium 134 - 144 mmol/L 140 140 139  Potassium 3.5 - 5.2 mmol/L 4.5 4.3 3.9  Chloride 96 - 106 mmol/L 105 102 108  CO2 20 - 29 mmol/L '23 21 23  ' Calcium 8.6 - 10.2 mg/dL 9.6 9.3 9.3   Hepatic Function Latest Ref Rng & Units  05/26/2018 07/21/2017 03/07/2017  Total Protein 6.0 - 8.5 g/dL 6.7 6.6 6.7  Albumin 3.5 - 4.7 g/dL 4.4 4.4 4.3  AST 0 - 40 IU/L '15 19 20  ' ALT 0 - 44 IU/L '15 20 22  ' Alk Phosphatase 39 - 117 IU/L 66 66 64  Total Bilirubin 0.0 - 1.2 mg/dL 0.8 1.0 0.9  Bilirubin, Direct 0.00 - 0.40 mg/dL - - 0.22   CBC Latest Ref Rng & Units 05/26/2018 07/21/2017 05/09/2017  WBC 3.4 - 10.8 x10E3/uL 7.9 8.4 8.6  Hemoglobin 13.0 - 17.7 g/dL 14.7 14.5 13.9  Hematocrit 37.5 - 51.0 % 43.6 41.7 42.0  Platelets 150 - 450 x10E3/uL 267 241 261   Lab Results  Component Value Date   MCV 90 05/26/2018   MCV 88 07/21/2017   MCV 90.3 05/09/2017   Lab Results  Component Value Date   TSH 2.410 05/26/2018   Lipid Panel     Component Value Date/Time   CHOL 143 05/26/2018 1010   CHOL 111 03/01/2015 1033   TRIG 173 (H) 05/26/2018 1010   TRIG 90 03/01/2015 1033   HDL 37 (L) 05/26/2018 1010   HDL 41 03/01/2015 1033   CHOLHDL 3.9 05/26/2018 1010   CHOLHDL 4.1 07/19/2016 0949   VLDL 24 07/19/2016 0949   LDLCALC 71 05/26/2018 1010   LDLCALC 52 03/01/2015 1033     RADIOLOGY: No results found.  IMPRESSION:  1. S/P AAA repair   2. Paroxysmal atrial flutter (HCC)   3. Aortic stenosis, mild   4. Coronary artery disease involving native coronary artery of native heart without angina pectoris   5. S/P CABG x 3, LIMA-LAD, VG-OM, VG-PDA 01/15/13    6. Anticoagulation adequate   7. First degree AV block     ASSESSMENT AND PLAN: Mr. Mathey is an active 84 year old white male who is status post emergent CABG revascularization surgery after  cardiac catheterization on 01/15/2013 demonstrated severe life threatening coronary anatomy.   He continues to participate in the maintenance phase of cardiac rehabilitation. He  developed atrial flutter in January 2015 and was started on anticoagulation with Xarelto and underwent cardioversion.  He develop recurrent episodes of atrial fibrillation last year and had maintained sinus rhythm  since his repeat cardioversion on 05/13/2017.  Interestingly, several weeks ago he again developed recurrent atrial flutter in 1 year later in May 13, 2018 was found to have a flutter with controlled rate.  No medication adjustments were made but he was scheduled to undergo cardioversion and was found that he had successfully cardioverted back to sinus rhythm and had PACs.  His ECG today continues to show sinus rhythm with first-degree AV block and occasional PACs.  His blood pressure today on repeat by me was 140/70.  With his current paroxysmal flutter I have suggested discontinuance of amlodipine and will substitute this for long-acting Cardizem initially at low-dose 120 mg to take at bedtime.  He will continue with his present dose of Toprol all succinate 12.5 mg daily.  He continues to be on Eliquis for anticoagulation.  He is on [employer] presently at 150 mg for hypertension.  I will not make this adjustment presently depending upon his blood pressure response further titration to 300 mg may be necessary in the future.  His physical exam suggested slightly louder aortic stenosis murmur.  I am recommending he undergo a one-year follow-up echo Doppler study for reassessment.  He is on metformin 500 mg.  I reviewed recent laboratory.  Fasting glucose was 100.  I have suggested that when he sees his primary care physician hemoglobin A1c be obtained.  He continues to be on atorvastatin 40 mg.  Most recent lipid panel showed an LDL of 71.  However triglycerides were elevated at 173 and I discussed that this could be contributed by his EtOH use as well as sweets/sugars and carbohydrates.  He will monitor his blood pressure at rest he continues to undergo cardiac rehab.  I will see him in 6 weeks for reevaluation or sooner if problems arise.  Time spent: 25 minutes  Troy Sine, MD, [facility]  05/29/2018 10:50 AM

## 2018-06-01 ENCOUNTER — Encounter (HOSPITAL_COMMUNITY)
Admission: RE | Admit: 2018-06-01 | Discharge: 2018-06-01 | Disposition: A | Discharge status: Home / Self Care | Payer: Medicare Other | Source: Ambulatory Visit | Attending: Cardiovascular Disease | Admitting: Cardiovascular Disease

## 2018-06-02 DIAGNOSIS — R5383 Other fatigue: Secondary | ICD-10-CM | POA: Diagnosis not present

## 2018-06-02 DIAGNOSIS — I4892 Unspecified atrial flutter: Secondary | ICD-10-CM | POA: Diagnosis not present

## 2018-06-02 DIAGNOSIS — J3489 Other specified disorders of nose and nasal sinuses: Secondary | ICD-10-CM | POA: Diagnosis not present

## 2018-06-02 NOTE — Addendum Note (Signed)
Addended by: Adline Peals on: 06/02/2018 02:59 PM   Modules accepted: Orders

## 2018-06-03 ENCOUNTER — Encounter (HOSPITAL_COMMUNITY)
Admission: RE | Admit: 2018-06-03 | Discharge: 2018-06-03 | Disposition: A | Discharge status: Home / Self Care | Payer: Self-pay | Source: Ambulatory Visit | Attending: Cardiovascular Disease | Admitting: Cardiovascular Disease

## 2018-06-05 ENCOUNTER — Encounter (HOSPITAL_COMMUNITY): Payer: Self-pay

## 2018-06-05 ENCOUNTER — Other Ambulatory Visit: Payer: Self-pay | Admitting: Cardiovascular Disease

## 2018-06-08 ENCOUNTER — Encounter (HOSPITAL_COMMUNITY)
Admission: RE | Admit: 2018-06-08 | Discharge: 2018-06-08 | Disposition: A | Discharge status: Home / Self Care | Payer: Self-pay | Source: Ambulatory Visit | Attending: Cardiovascular Disease | Admitting: Cardiovascular Disease

## 2018-06-10 ENCOUNTER — Encounter (HOSPITAL_COMMUNITY): Payer: Self-pay

## 2018-06-12 ENCOUNTER — Encounter (HOSPITAL_COMMUNITY): Payer: Self-pay

## 2018-06-15 ENCOUNTER — Encounter (HOSPITAL_COMMUNITY): Payer: Self-pay

## 2018-06-17 ENCOUNTER — Encounter (HOSPITAL_COMMUNITY): Payer: Self-pay

## 2018-06-19 ENCOUNTER — Encounter (HOSPITAL_COMMUNITY): Payer: Self-pay

## 2018-06-22 ENCOUNTER — Encounter (HOSPITAL_COMMUNITY)
Admission: RE | Admit: 2018-06-22 | Discharge: 2018-06-22 | Disposition: A | Discharge status: Home / Self Care | Payer: Self-pay | Source: Ambulatory Visit | Attending: Cardiovascular Disease | Admitting: Cardiovascular Disease

## 2018-06-24 ENCOUNTER — Encounter (HOSPITAL_COMMUNITY)
Admission: RE | Admit: 2018-06-24 | Discharge: 2018-06-24 | Disposition: A | Discharge status: Home / Self Care | Payer: Self-pay | Source: Ambulatory Visit | Attending: Cardiovascular Disease | Admitting: Cardiovascular Disease

## 2018-06-24 DIAGNOSIS — I2510 Atherosclerotic heart disease of native coronary artery without angina pectoris: Secondary | ICD-10-CM | POA: Insufficient documentation

## 2018-06-25 ENCOUNTER — Ambulatory Visit (HOSPITAL_COMMUNITY): Payer: Medicare Other | Attending: Cardiovascular Disease

## 2018-06-25 ENCOUNTER — Other Ambulatory Visit: Payer: Self-pay

## 2018-06-25 DIAGNOSIS — I080 Rheumatic disorders of both mitral and aortic valves: Secondary | ICD-10-CM | POA: Diagnosis not present

## 2018-06-25 DIAGNOSIS — I4892 Unspecified atrial flutter: Secondary | ICD-10-CM | POA: Insufficient documentation

## 2018-06-25 DIAGNOSIS — I2510 Atherosclerotic heart disease of native coronary artery without angina pectoris: Secondary | ICD-10-CM | POA: Diagnosis not present

## 2018-06-25 DIAGNOSIS — E785 Hyperlipidemia, unspecified: Secondary | ICD-10-CM | POA: Diagnosis not present

## 2018-06-25 DIAGNOSIS — I119 Hypertensive heart disease without heart failure: Secondary | ICD-10-CM | POA: Insufficient documentation

## 2018-06-25 DIAGNOSIS — I350 Nonrheumatic aortic (valve) stenosis: Secondary | ICD-10-CM | POA: Diagnosis not present

## 2018-06-26 ENCOUNTER — Encounter (HOSPITAL_COMMUNITY)
Admission: RE | Admit: 2018-06-26 | Discharge: 2018-06-26 | Disposition: A | Discharge status: Home / Self Care | Payer: Self-pay | Source: Ambulatory Visit | Attending: Cardiovascular Disease | Admitting: Cardiovascular Disease

## 2018-06-29 ENCOUNTER — Encounter (HOSPITAL_COMMUNITY)
Admission: RE | Admit: 2018-06-29 | Discharge: 2018-06-29 | Disposition: A | Discharge status: Home / Self Care | Payer: Self-pay | Source: Ambulatory Visit | Attending: Cardiovascular Disease | Admitting: Cardiovascular Disease

## 2018-07-01 ENCOUNTER — Encounter (HOSPITAL_COMMUNITY)
Admission: RE | Admit: 2018-07-01 | Discharge: 2018-07-01 | Disposition: A | Discharge status: Home / Self Care | Payer: Self-pay | Source: Ambulatory Visit | Attending: Cardiovascular Disease | Admitting: Cardiovascular Disease

## 2018-07-03 ENCOUNTER — Encounter (HOSPITAL_COMMUNITY)
Admission: RE | Admit: 2018-07-03 | Discharge: 2018-07-03 | Disposition: A | Discharge status: Home / Self Care | Payer: Self-pay | Source: Ambulatory Visit | Attending: Cardiovascular Disease | Admitting: Cardiovascular Disease

## 2018-07-06 ENCOUNTER — Encounter (HOSPITAL_COMMUNITY)
Admission: RE | Admit: 2018-07-06 | Discharge: 2018-07-06 | Disposition: A | Discharge status: Home / Self Care | Payer: Medicare Other | Source: Ambulatory Visit | Attending: Cardiovascular Disease | Admitting: Cardiovascular Disease

## 2018-07-07 ENCOUNTER — Ambulatory Visit: Payer: Medicare Other | Admitting: Cardiovascular Disease

## 2018-07-07 ENCOUNTER — Encounter: Payer: Self-pay | Admitting: Cardiovascular Disease

## 2018-07-07 VITALS — BP 134/66 | HR 54 | Ht 73.5 in | Wt 242.3 lb

## 2018-07-07 DIAGNOSIS — Z8679 Personal history of other diseases of the circulatory system: Secondary | ICD-10-CM

## 2018-07-07 DIAGNOSIS — Z951 Presence of aortocoronary bypass graft: Secondary | ICD-10-CM | POA: Diagnosis not present

## 2018-07-07 DIAGNOSIS — E785 Hyperlipidemia, unspecified: Secondary | ICD-10-CM

## 2018-07-07 DIAGNOSIS — I2510 Atherosclerotic heart disease of native coronary artery without angina pectoris: Secondary | ICD-10-CM | POA: Diagnosis not present

## 2018-07-07 DIAGNOSIS — E1159 Type 2 diabetes mellitus with other circulatory complications: Secondary | ICD-10-CM

## 2018-07-07 DIAGNOSIS — I350 Nonrheumatic aortic (valve) stenosis: Secondary | ICD-10-CM

## 2018-07-07 DIAGNOSIS — I4892 Unspecified atrial flutter: Secondary | ICD-10-CM

## 2018-07-07 DIAGNOSIS — Z7901 Long term (current) use of anticoagulants: Secondary | ICD-10-CM

## 2018-07-07 DIAGNOSIS — E669 Obesity, unspecified: Secondary | ICD-10-CM

## 2018-07-07 DIAGNOSIS — I440 Atrioventricular block, first degree: Secondary | ICD-10-CM

## 2018-07-07 NOTE — Progress Notes (Signed)
Patient ID: Derek Wells, male   DOB: [birth year], 84 y.o.   MRN: [id number]     HPI: Derek Wells is a 84 y.o. male who presents to the office today for a 6 week followup cardiology evaluation.   Derek Wells has a history of hypertension, hyperlipidemia, and is s/p abdominal aortic aneurysm repair in April 2010. I had seen him on 01/14/2013 at which time he had experienced symptoms worrisome for unstable angina. Previously, in September 2013 he had undergone a nuclear perfusion study  which was essentially normal although did show mild diaphragmatic attenuation. He also been found to have a atherogenic dyslipidemia pattern with reference to his lipid panel. Catheterization the following day on 01/15/2013 revealed life-threatening coronary anatomy with 60-70% ostial left main stenosis, 90-95% distal left main stenosis, 95-99% ostial LAD stenosis, and mild/moderate stenoses in the RCA. That same day he was successfully operated by Dr. Arvid Wells and underwent CABG x3 with a LIMA to the LAD, SVG to the OM, and SVG to the PDA. He had a unremarkable postoperative course and was discharged 4 days later.  He saw Derek Wells on 01/27/13 and was doing well. I saw him in followup on 03/02/2013 and he  participating  in cardiac rehabilitation. When I saw him in September 2014, he was in sinus rhythm and has and had resumed a good level of activity.   In January 2015, he was found to be in atrial flutter at cardiac rehabilitation. He was unaware of his heart rhythm being abnormal. He had developed a URI over the holidays prior to that and had just completed a steroid dose pack. Xarelto 20 mg was added to his medical regimen. He did undergo subsequent blood work on January 20 which showed normal renal function and normal electrolytes. Liver function studies were normal. Hemoglobin was 13.7 hematocrit 41.7. Platelets were normal. TSH was normal at 2.63. Magnesium was normal at 2.0.  He underwent successful cardioversion for  his atrial flutter on 11/12/2013 and he cardioverted to sinus rhythm at 100 J.  Since his cardioversion, he is unaware of any recurrent rhythm disturbance.   He is in the maintenance phase of cardiac rehabilitation.  When I  saw him in June 2015, he told me that he had noticed several episodes of a vague chest sensation particularly when he rolls his garbage can back up a hill at home.    On 03/18/2014, he underwent a nuclear perfusion study, which showed reduced exercise capacity with a workload of only 5.9.  He did experience mild shortness of breath.  There was no significant ST depression, but he had developed transient left bundle branch block.  The scan was interpreted at low as low risk.  Ejection fraction was 65% without wall motion abnormalities.  An echo Doppler study showed an EF of 60-65%.  There were no regional wall motion abnormalities.  He had mild biatrial enlargement.  Derek Wells currently is playing golf at least 2 times per week and occasionally 3.  He is in the maintenance phase of cardiac rehabilitation.  He does admit to some weight gain.  He denies any episodes of chest pain, particularly during the cardiac rehabilitation program.  He has noticed some mild shortness of breath with deep breathing and some vague pressure in his chest which is short-lived walking up hills when he walks fast with his wife.  He has noticed some mild shortness of breath walking up steep hills in the golf course without chest pressure.  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 2016.  When I saw him in 2017 he was anemic and had significantly reduced iron saturation at 6%, and his serum ferritin was 7.  I referred him for GI evaluation and underwent both colonoscopy and endoscopy by Dr. Laurence Wells and 4 polyps were removed from his colon.  He was not having any active bleeding.  He does note improved energy.  He had taken Niferex initially and transitioned to over-the-counter iron. He  has more energy.  He is continuing with cardiac rehabilitation.  He underwent a follow-up abdominal ultrasound on 06/09/2015 which showed a patent aortobifemoral bypass graft without focal dilatation or stenosis.   In the late summer of 2017 he was at the beach for several weeks and played golf heat without chest pain.  However with fast walking he noted chest pressure.  For the past month he has had a cough.  He has a ventral hernia.  When I saw him, his ECG showed progressive PR prolongation.  I reduced his Lopressor back to 25 mg daily and added amlodipine 5 mg blood pressure and his chest tightness.  I scheduled him for a nuclear perfusion study which was done on 07/23/2016.  Ejection fraction was 62%.  There were no ECG changes ischemia, but there was a rare PVC.  He had normal perfusion without scar or ischemia.  His follow-up laboratory showed significant improvement with his iron saturation, now at 27%.  Lipid studies revealed a total cholesterol 164, triglycerides 122, HDL 40, and LDL 100.    When I last saw him in January 2018, I suggested the addition of Zetia to his atorvastatin.  In the past.  He did not tolerate 80 mg, atorvastatin, and also had myalgias on 40 mg.  I also discussed plaque regression studies with Repatha.  He developed recurrent atrial fibrillation and had multiple evaluations in the A. fib clinic commencing in July 2018.  Ultimately, on 05/13/2017, he underwent successful cardioversion with restoration of sinus rhythm.  He saw Derek Wells in follow-up of his cardioversion and was maintaining sinus rhythm.  There was discussion concerning his EtOH use and reduction in consumption.  He typically has at least 1 or more vodka every day.    When I lsaw him in February 2019 he was continuing to feel well and denied any recurrent episodes of atrial fibrillation.  His last echo Doppler study in October 2018 showed normal LV systolic and diastolic function.  He had biatrial enlargement  with mild aortic valve stenosis, and mean gradient of 14 and peak gradient of 25.  On follow-up abdominal aortic Doppler study the largest aortic measurement was 2.7 cm.   He was playing golf several days per week and was continue to participate in cardiac rehab.  On the evening of May 12, 2018 he was going up steps to bed developed an episode of full sensation in his throat chest with some radiate this was associated with palpitations.  Symptoms lasted approximately 10 minutes.  He was seen in the following morning relation clinic and was found to be in atrial flutter with a controlled ventricular rate.  He was scheduled to undergo DC cardioversion but upon arrival for his cardioversion procedure his ECG showed that he had converted back to sinus rhythm and had PACs.  I last saw on May 29, 2018.  He admitted to occasional lightheadedness and sensation of head fog.  He denied any exertional chest tightness.  He was continuing to drink occasional vodka but admits that his drinking is less.  He was sleeping well.  He denied any significant snoring or daytime sleepiness.  Derek Wells revealed normal thyroid function studies.  History was normal although glucose was minimally increased at 103.  CBC was normal.  LDL cholesterol was 71 but triglycerides are elevated at 173 total cholesterol 143 and HDL 37.  Because of increased cardiac murmur on auscultation he underwent a follow-up echo Doppler study in June 25, 2018.  This continues to show excellent ejection fraction at 60 to 65% with grade 2 diastolic dysfunction.  There was mild aortic valve stenosis with a mean gradient of 14, peak instantaneous gradient of 29, and calculated valve area of 1.  6 8 cm.  The group was normal size.  He denies chest pain.  He denies PND orthopnea.  Times he notes some mild shortness of breath with bending over.  He continues to play golf several days per week.  He presents for reevaluation.   Past Medical History:    Diagnosis Date  . CAD (coronary artery disease), severe needing CABG 01/19/2013  . Chest pain    2D ECHO, 06/16/2012 - EF 50-55%, borderline low left ventricular systolic function  . Coronary artery disease   . Dyspnea on exertion    LEXISCAN, 06/17/2012 - fixed inferior bowel artifact and basal septal defect, probably related to IVCD/incomplete LBBB, no reversible ischemia, post-stress EF 66%  . History of AAA (abdominal aortic aneurysm) repair    ABDOMINAL AORTIC DOPPLER, 06/16/2013 - normal  . HTN (hypertension) 01/19/2013  . Hyperlipidemia LDL goal < 70 01/19/2013  . Hypertension   . S/P CABG x 3 01/19/2013    Past Surgical History:  Procedure Laterality Date  . CARDIAC CATHETERIZATION  01/15/2013   Severe coronary obstructive disease with 60-70% ostial left main and 90-85% distal LAD stenosis and mild-moderate stenoses in RCA, recommended for CABG  . CARDIOVERSION N/A 11/12/2013   Procedure: CARDIOVERSION;  Surgeon: Troy Sine, MD;  Location: [facility] ENDOSCOPY;  Service: Cardiovascular;  Laterality: N/A;  11:16 Lido 38m, Propofol 1281mIV 11:19 synch 100 joules conversion conversion to NSR....  . CARDIOVERSION N/A 05/13/2017   Procedure: CARDIOVERSION;  Surgeon: NaAcie FredricksonhWonda ChengMD;  [facility];  Service: Cardiovascular;  Laterality: N/A;  . CORONARY ARTERY BYPASS GRAFT N/A 01/15/2013   Procedure: Coronary Artery Bypass Graft times three using Wells Greater Saphenous Vein Graft harvested endoscopically and Left Internal Mammary Artery and;  Surgeon: BrGaye PollackMD;  [facility];  Service: Open Heart Surgery;  Laterality: N/A;  . INTRAOPERATIVE TRANSESOPHAGEAL ECHOCARDIOGRAM  01/15/2013   Procedure: INTRAOPERATIVE TRANSESOPHAGEAL ECHOCARDIOGRAM;  Surgeon: BrGaye PollackMD;  [facility];  Service: Open Heart Surgery;;  . LEFT HEART CATHETERIZATION WITH CORONARY ANGIOGRAM N/A 01/15/2013   Procedure: LEFT HEART CATHETERIZATION WITH CORONARY ANGIOGRAM;  Surgeon: ThTroy SineMD;  [facility];  Service: Cardiovascular;  Laterality: N/A;  . VASCULAR SURGERY      Allergies  Allergen Reactions  . Penicillins Rash    Has patient had a PCN reaction causing immediate rash, facial/tongue/throat swelling, SOB or lightheadedness with hypotension: no Has patient had a PCN reaction causing severe rash involving mucus membranes or skin necrosis: no Has patient had a PCN reaction that required hospitalization: no Has patient had a PCN reaction occurring within the last 10 years: yes If all of the above answers are "NO", then may proceed with Cephalosporin use.  Current Outpatient Medications  Medication Sig Dispense Refill  . atorvastatin (LIPITOR) 40 MG tablet TAKE 1 TABLET BY MOUTH ONCE DAILY (Patient taking differently: Take 40 mg by mouth at bedtime. ) 90 tablet 3  . diltiazem (CARDIZEM CD) 120 MG 24 hr capsule Take 1 capsule (120 mg total) by mouth at bedtime. 90 capsule 3  . ELIQUIS 5 MG TABS tablet TAKE 1 TABLET BY MOUTH TWICE DAILY (Patient taking differently: Take 5 mg by mouth 2 (two) times daily. ) 60 tablet 6  . irbesartan (AVAPRO) 150 MG tablet Take 1 tablet (150 mg total) by mouth daily. 90 tablet 1  . metFORMIN (GLUCOPHAGE) 500 MG tablet Take 500 mg by mouth daily with breakfast.     . metoprolol succinate (TOPROL-XL) 25 MG 24 hr tablet TAKE 1/2 TABLET(12.5 MG) BY MOUTH DAILY 30 tablet 6  . Multiple Vitamin (MULTIVITAMIN WITH MINERALS) TABS Take 1 tablet by mouth daily.     No current facility-administered medications for this visit.     Socially he is re-married. His first wife of 42 years passed away. He previously had smoked cigars prior to his bypass surgery but has not had any tobacco use since.  He continues to play golf at least 2 times per week.  He does a lot of leg exercise with cardiac rehabilitation.  ROS General: Negative; No fevers, chills, or night sweats;  HEENT: Negative; No changes in vision or hearing, sinus  congestion, difficulty swallowing Pulmonary: URI symptoms during the winter; No cough, wheezing, shortness of breath, hemoptysis Cardiovascular: Mild shortness of breath with uphill walking; see history of present illness GI: Negative; No nausea, vomiting, diarrhea, or abdominal pain GU: Negative; No dysuria, hematuria, or difficulty voiding Musculoskeletal: Negative; no myalgias, joint pain, or weakness Hematologic/Oncology: Negative; no easy bruising, bleeding Endocrine: Negative; no heat/cold intolerance; no diabetes Neuro: Negative; no changes in balance, headaches Skin: Removal of small left forearm melanoma. Psychiatric: Negative; No behavioral problems, depression Sleep: Negative; No snoring, daytime sleepiness, hypersomnolence, bruxism, restless legs, hypnogognic hallucinations, no cataplexy Other comprehensive 14 point system review is negative.   PE BP 134/66 (BP Location: Left Arm, Patient Position: Sitting, Cuff Size: Normal)   Pulse (!) 54   Ht 6' 1.5" (1.867 m)   Wt 242 lb 4.8 oz (109.9 kg)   BMI 31.53 kg/m    Repeat blood pressure by me was 124/70  Wt Readings from Last 3 Encounters:  07/07/18 242 lb 4.8 oz (109.9 kg)  05/29/18 243 lb 9.6 oz (110.5 kg)  05/13/18 243 lb 9.6 oz (110.5 kg)   General: Alert, oriented, no distress.  Skin: normal turgor, no rashes, warm and dry HEENT: Normocephalic, atraumatic. Pupils equal round and reactive to light; sclera anicteric; extraocular muscles intact;  Nose without nasal septal hypertrophy Mouth/Parynx benign; Mallinpatti scale Neck: No JVD, no carotid bruits; normal carotid upstroke Lungs: clear to ausculatation and percussion; no wheezing or rales Chest wall: without tenderness to palpitation Heart: PMI not displaced, RRR, s1 s2 normal, 2/6 systolic murmur aortic area consistent with his aortic stenosis., no diastolic murmur, no rubs, gallops, thrills, or heaves Abdomen: Diastasis recti; soft, nontender; no  hepatosplenomehaly, BS+; abdominal aorta nontender and not dilated by palpation. Back: no CVA tenderness Pulses 2+ Musculoskeletal: full range of motion, normal strength, no joint deformities Extremities: no clubbing cyanosis or edema, Homan's sign negative  Neurologic: grossly nonfocal; Cranial nerves grossly wnl Psychologic: Normal mood and affect   ECG (independently read by me): Sinus bradycardia with first-degree AV  block.  PR interval 392 ms.  Incomplete left bundle branch block.  May 29, 2018 ECG (independently read by me): Sinus rhythm with first-degree AV block at 72 bpm with a PR interval of 312 ms.  Occasional PACs.  February 2019 ECG (independently read by me): Sinus rhythm at 63 bpm.  First-degree AV block with a PR interval at 344 ms.  PAC.  Nonspecific interventricular conduction delay.  October 2018 ECG (independently read by me): Sinus rhythm with first-degree AV block with a PR interval of 314 ms.  Nonspecific interventricular block.  QTc interval 431  January 2018 ECG (independently read by me): Normal sinus rhythm with first-degree AV block.  PR interval 300 ms.  Nonspecific interventricular conduction delay.  October 2017 ECG (independently read by me): Normal sinus rhythm at 66 bpm.  First-degree block with a PR interval at 290 ms.  Nonspecific interventricular block.  03/02/2015 ECG (independently read by me): Sinus rhythm at 75 bpm.  Occasional unifocal PVCs.  Nonspecific interventricular block.  Prior October 2015 ECG (independently read by me): Normal sinus rhythm at 67 beats per minute.  First degree A-V block with PR interval at 290 ms.  QTc interval 439 ms.  No ST segment changes.  Prior June 2015 ECG (independently read by me): Normal sinus rhythm at 68 beats per minute with first degree AV block with a PR interval at 284 ms.  QTc interval 435 ms.  No significant ST-T changes.  T-wave inversion in aVL  ECG today (independently read by me):  Normal sinus  rhythm at 71 beats per minute. First create a block with PR interval at 240 ms. QTc interval normal at 4-5 ms.  Prior 10/27/13 ECG (independently read by me): Atrial flutter with variable rate but predominantly 4-1 with occasional 3-1 block; average heart rate 70 beats per minute  Prior ECG from 06/04/2013: Sinus rhythm with first-degree AV block. Nonspecific interventricular block  LABS: BMP Latest Ref Rng & Units 05/26/2018 07/21/2017 05/09/2017  Glucose 65 - 99 mg/dL 103(H) 106(H) 134(H)  BUN 8 - 27 mg/dL '15 16 15  ' Creatinine 0.76 - 1.27 mg/dL 0.96 0.86 0.95  BUN/Creat Ratio 10 - '24 16 19 ' -  Sodium 134 - 144 mmol/L 140 140 139  Potassium 3.5 - 5.2 mmol/L 4.5 4.3 3.9  Chloride 96 - 106 mmol/L 105 102 108  CO2 20 - 29 mmol/L '23 21 23  ' Calcium 8.6 - 10.2 mg/dL 9.6 9.3 9.3   Hepatic Function Latest Ref Rng & Units 05/26/2018 07/21/2017 03/07/2017  Total Protein 6.0 - 8.5 g/dL 6.7 6.6 6.7  Albumin 3.5 - 4.7 g/dL 4.4 4.4 4.3  AST 0 - 40 IU/L '15 19 20  ' ALT 0 - 44 IU/L '15 20 22  ' Alk Phosphatase 39 - 117 IU/L 66 66 64  Total Bilirubin 0.0 - 1.2 mg/dL 0.8 1.0 0.9  Bilirubin, Direct 0.00 - 0.40 mg/dL - - 0.22   CBC Latest Ref Rng & Units 05/26/2018 07/21/2017 05/09/2017  WBC 3.4 - 10.8 x10E3/uL 7.9 8.4 8.6  Hemoglobin 13.0 - 17.7 g/dL 14.7 14.5 13.9  Hematocrit 37.5 - 51.0 % 43.6 41.7 42.0  Platelets 150 - 450 x10E3/uL 267 241 261   Lab Results  Component Value Date   MCV 90 05/26/2018   MCV 88 07/21/2017   MCV 90.3 05/09/2017   Lab Results  Component Value Date   TSH 2.410 05/26/2018   Lipid Panel     Component Value Date/Time   CHOL 143 05/26/2018 1010  CHOL 111 03/01/2015 1033   TRIG 173 (H) 05/26/2018 1010   TRIG 90 03/01/2015 1033   HDL 37 (L) 05/26/2018 1010   HDL 41 03/01/2015 1033   CHOLHDL 3.9 05/26/2018 1010   CHOLHDL 4.1 07/19/2016 0949   VLDL 24 07/19/2016 0949   LDLCALC 71 05/26/2018 1010   LDLCALC 52 03/01/2015 1033     RADIOLOGY: No results  found.  IMPRESSION:  1. Coronary artery disease involving native coronary artery of native heart without angina pectoris   2. S/P CABG x 3, LIMA-LAD, VG-OM, VG-PDA 01/15/13    3. Mild aortic stenosis   4. Paroxysmal atrial flutter (HCC)   5. First degree AV block   6. Anticoagulation adequate   7. Hyperlipidemia with target LDL less than 70   8. History of abdominal aortic aneurysm (AAA)   9. Type 2 diabetes mellitus with other circulatory complication, without long-term current use of insulin ([place])   10. Mild obesity     ASSESSMENT AND PLAN: Derek Wells is an active 84 year old white male who is status post emergent CABG revascularization surgery after cardiac catheterization on 01/15/2013 demonstrated severe life threatening coronary anatomy.   He continues to participate in the maintenance phase of cardiac rehabilitation. He  developed atrial flutter in January 2015 and was started on anticoagulation with Xarelto and underwent cardioversion.  He developed recurrent episodes of atrial fibrillation in 2018 and had maintained sinus rhythm since his repeat cardioversion on 05/13/2017.  He again developed recurrent atrial flutter 1 year later in May 13, 2018 was found to have a flutter with controlled rate.  No medication adjustments were made but he was scheduled to undergo cardioversion and was found that he had successfully cardioverted back to sinus rhythm and had PACs.  When I saw him in follow-up, he was in sinus rhythm with first-degree AV block and occasional PACs.  ECG today again confirms sinus rhythm with first-degree AV block with PR interval slightly more prolonged.  His blood pressure today is stable on his regimen consisting of diltiazem 120 mill grams at bedtime, irbesartan 150 mill grams daily, and Toprol-XL 12.5 mg.  He continues to be on atorvastatin 40 mg for hyperlipidemia.  Most recent lipid studies showed an LDL cholesterol of 71.  He was wondering if he could reduce his  atorvastatin to 40 mg but with his LDL at his current level I have not recommended this.  If he were to disc to reduce treatment to 20 mg I discussed that I will then add Zetia 10 mg to keep his LDL less than 70.  As result he prefers to stay on the 40 mg dosing.  Presently he denies any chest pain.  There is no presyncope or syncope.  There is no CHF symptoms.  BMI is increased consistent with mild obesity.  His triglycerides were elevated and I suspect this may be related to sweets/sugar intake and his EtOH use.  Continues to be on Eliquis for anticoagulation and is tolerating this well without bleeding.  I thoroughly reviewed his echo Doppler study which confirmed mild aortic stenosis.  This will be followed closely.  I will see him in 6 months for reevaluation..  Time Spent: 25 minutes  Troy Sine, MD, [facility]  07/07/2018 1:07 PM

## 2018-07-07 NOTE — Patient Instructions (Signed)
Medication Instructions:  Your physician recommends that you continue on your current medications as directed. Please refer to the Current Medication list given to you today.  If you need a refill on your cardiac medications before your next appointment, please call your pharmacy.   Follow-Up: At [facility], you and your health needs are our priority.  As part of our continuing mission to provide you with exceptional heart care, we have created designated Provider Care Teams.  These Care Teams include your primary Cardiologist (physician) and Advanced Practice Providers (APPs -  Physician Assistants and Nurse Practitioners) who all work together to provide you with the care you need, when you need it. You will need a follow up appointment in 6 months.  Please call our office 2 months in advance to schedule this appointment.  You may see Dr. Kelly or one of the following Advanced Practice Providers on your designated Care Team: Hao Meng, PA-C . Angela Duke, PA-C  .  

## 2018-07-08 ENCOUNTER — Encounter (HOSPITAL_COMMUNITY)
Admission: RE | Admit: 2018-07-08 | Discharge: 2018-07-08 | Disposition: A | Discharge status: Home / Self Care | Payer: Medicare Other | Source: Ambulatory Visit | Attending: Cardiovascular Disease | Admitting: Cardiovascular Disease

## 2018-07-10 ENCOUNTER — Encounter (HOSPITAL_COMMUNITY): Payer: Self-pay

## 2018-07-13 ENCOUNTER — Encounter (HOSPITAL_COMMUNITY): Payer: Self-pay

## 2018-07-15 ENCOUNTER — Encounter (HOSPITAL_COMMUNITY)
Admission: RE | Admit: 2018-07-15 | Discharge: 2018-07-15 | Disposition: A | Discharge status: Home / Self Care | Payer: Self-pay | Source: Ambulatory Visit | Attending: Cardiovascular Disease | Admitting: Cardiovascular Disease

## 2018-07-17 ENCOUNTER — Encounter (HOSPITAL_COMMUNITY)
Admission: RE | Admit: 2018-07-17 | Discharge: 2018-07-17 | Disposition: A | Discharge status: Home / Self Care | Payer: Self-pay | Source: Ambulatory Visit | Attending: Cardiovascular Disease | Admitting: Cardiovascular Disease

## 2018-07-19 DIAGNOSIS — Z23 Encounter for immunization: Secondary | ICD-10-CM | POA: Diagnosis not present

## 2018-07-20 ENCOUNTER — Encounter (HOSPITAL_COMMUNITY)
Admission: RE | Admit: 2018-07-20 | Discharge: 2018-07-20 | Disposition: A | Discharge status: Home / Self Care | Payer: Self-pay | Source: Ambulatory Visit | Attending: Cardiovascular Disease | Admitting: Cardiovascular Disease

## 2018-07-22 ENCOUNTER — Encounter (HOSPITAL_COMMUNITY): Payer: Self-pay

## 2018-07-24 ENCOUNTER — Encounter (HOSPITAL_COMMUNITY): Payer: Self-pay

## 2018-07-24 DIAGNOSIS — I2510 Atherosclerotic heart disease of native coronary artery without angina pectoris: Secondary | ICD-10-CM | POA: Insufficient documentation

## 2018-07-27 ENCOUNTER — Encounter (HOSPITAL_COMMUNITY): Payer: Self-pay

## 2018-07-29 ENCOUNTER — Encounter (HOSPITAL_COMMUNITY)
Admission: RE | Admit: 2018-07-29 | Discharge: 2018-07-29 | Disposition: A | Discharge status: Home / Self Care | Payer: Self-pay | Source: Ambulatory Visit | Attending: Cardiovascular Disease | Admitting: Cardiovascular Disease

## 2018-07-31 ENCOUNTER — Encounter (HOSPITAL_COMMUNITY): Payer: Self-pay

## 2018-08-03 ENCOUNTER — Encounter (HOSPITAL_COMMUNITY): Payer: Self-pay

## 2018-08-05 ENCOUNTER — Encounter (HOSPITAL_COMMUNITY)
Admission: RE | Admit: 2018-08-05 | Discharge: 2018-08-05 | Disposition: A | Discharge status: Home / Self Care | Payer: Self-pay | Source: Ambulatory Visit | Attending: Cardiovascular Disease | Admitting: Cardiovascular Disease

## 2018-08-07 ENCOUNTER — Encounter (HOSPITAL_COMMUNITY): Payer: Self-pay

## 2018-08-10 ENCOUNTER — Encounter (HOSPITAL_COMMUNITY)
Admission: RE | Admit: 2018-08-10 | Discharge: 2018-08-10 | Disposition: A | Discharge status: Home / Self Care | Payer: Medicare Other | Source: Ambulatory Visit | Attending: Cardiovascular Disease | Admitting: Cardiovascular Disease

## 2018-08-12 ENCOUNTER — Encounter (HOSPITAL_COMMUNITY)
Admission: RE | Admit: 2018-08-12 | Discharge: 2018-08-12 | Disposition: A | Discharge status: Home / Self Care | Payer: Self-pay | Source: Ambulatory Visit | Attending: Cardiovascular Disease | Admitting: Cardiovascular Disease

## 2018-08-14 ENCOUNTER — Encounter (HOSPITAL_COMMUNITY): Payer: Self-pay

## 2018-08-17 ENCOUNTER — Encounter (HOSPITAL_COMMUNITY): Payer: Self-pay

## 2018-08-18 DIAGNOSIS — J3489 Other specified disorders of nose and nasal sinuses: Secondary | ICD-10-CM | POA: Diagnosis not present

## 2018-08-18 DIAGNOSIS — R05 Cough: Secondary | ICD-10-CM | POA: Diagnosis not present

## 2018-08-19 ENCOUNTER — Encounter (HOSPITAL_COMMUNITY): Payer: Self-pay

## 2018-08-24 ENCOUNTER — Encounter (HOSPITAL_COMMUNITY)
Admission: RE | Admit: 2018-08-24 | Discharge: 2018-08-24 | Disposition: A | Discharge status: Home / Self Care | Payer: Self-pay | Source: Ambulatory Visit | Attending: Cardiovascular Disease | Admitting: Cardiovascular Disease

## 2018-08-24 DIAGNOSIS — I2510 Atherosclerotic heart disease of native coronary artery without angina pectoris: Secondary | ICD-10-CM | POA: Insufficient documentation

## 2018-08-25 ENCOUNTER — Telehealth: Payer: Self-pay | Admitting: Cardiovascular Disease

## 2018-08-25 NOTE — Telephone Encounter (Signed)
New message     Pt c/o Shortness Of Breath: STAT if SOB developed within the last 24 hours or pt is noticeably SOB on the phone  1. Are you currently SOB (can you hear that pt is SOB on the phone)? No   2. How long have you been experiencing SOB?2 weeks   3. Are you SOB when sitting or when up moving around?moving around   4. Are you currently experiencing any other symptoms? Can't go back to sleep when sob wakes him up

## 2018-08-25 NOTE — Telephone Encounter (Signed)
Patient aware Dr. Tresa EndoKelly out of office until next week.    Scheduled to see Hinda GlatterL. Kilroy PA tomorrow at 11 AM.    Patient verbalized understanding.

## 2018-08-25 NOTE — Telephone Encounter (Signed)
Returned call to patient. He reports SOB with exertion, sinus congestion. [place] and told him he was having SOB then. He wonders if the SOB is related to the med change to diltiazem - symptoms have been going on about 3-4 weeks. He also reports that when he wakes up to go to bathroom at night around 330-4am he cannot go back to sleep but this is not related to SOB. He does not think he has had any weight gain or swelling apart from possible weight gain from Thanksgiving.   Advised would route to MD for advice

## 2018-08-26 ENCOUNTER — Encounter (HOSPITAL_COMMUNITY): Payer: Self-pay

## 2018-08-26 ENCOUNTER — Encounter: Payer: Self-pay | Admitting: Cardiology

## 2018-08-26 ENCOUNTER — Ambulatory Visit: Payer: Medicare Other | Admitting: Cardiology

## 2018-08-26 DIAGNOSIS — Z7901 Long term (current) use of anticoagulants: Secondary | ICD-10-CM

## 2018-08-26 DIAGNOSIS — I10 Essential (primary) hypertension: Secondary | ICD-10-CM

## 2018-08-26 DIAGNOSIS — I350 Nonrheumatic aortic (valve) stenosis: Secondary | ICD-10-CM | POA: Insufficient documentation

## 2018-08-26 DIAGNOSIS — Z951 Presence of aortocoronary bypass graft: Secondary | ICD-10-CM | POA: Diagnosis not present

## 2018-08-26 DIAGNOSIS — R0600 Dyspnea, unspecified: Secondary | ICD-10-CM

## 2018-08-26 DIAGNOSIS — R0609 Other forms of dyspnea: Secondary | ICD-10-CM | POA: Diagnosis not present

## 2018-08-26 DIAGNOSIS — I4819 Other persistent atrial fibrillation: Secondary | ICD-10-CM | POA: Diagnosis not present

## 2018-08-26 DIAGNOSIS — E785 Hyperlipidemia, unspecified: Secondary | ICD-10-CM

## 2018-08-26 DIAGNOSIS — Z9889 Other specified postprocedural states: Secondary | ICD-10-CM | POA: Diagnosis not present

## 2018-08-26 DIAGNOSIS — Z8679 Personal history of other diseases of the circulatory system: Secondary | ICD-10-CM

## 2018-08-26 MED ORDER — FUROSEMIDE 20 MG PO TABS: 20.0000 mg | ORAL_TABLET | Freq: Every day | ORAL | 2 refills | Status: DC

## 2018-08-26 NOTE — Assessment & Plan Note (Signed)
2008

## 2018-08-26 NOTE — Addendum Note (Signed)
Addended by: Adline PealsGOODNER, Shondell Fabel N on: 08/26/2018 02:48 PM   Modules accepted: Orders

## 2018-08-26 NOTE — Patient Instructions (Signed)
Medication Instructions:  Your physician has recommended you make the following change in your medication:  1) STOP Diltiazem 2) START Lasix 20mg  daily. An Rx has been sent to your pharmacy   If you need a refill on your cardiac medications before your next appointment, please call your pharmacy.   Lab work: None ordered If you have labs (blood work) drawn today and your tests are completely normal, you will receive your results only by: Marland Kitchen. MyChart Message (if you have MyChart) OR . A paper copy in the mail If you have any lab test that is abnormal or we need to change your treatment, we will call you to review the results.  Testing/Procedures: None ordered  Follow-Up: At [facility], you and your health needs are our priority.  As part of our continuing mission to provide you with exceptional heart care, we have created designated Provider Care Teams.  These Care Teams include your primary Cardiologist (physician) and Advanced Practice Providers (APPs -  Physician Assistants and Nurse Practitioners) who all work together to provide you with the care you need, when you need it. Your physician recommends that you schedule a follow-up appointment early next week with Derek Cocoonna Carroll, NP in the AFIB Clinic   Any Other Special Instructions Will Be Listed Below (If Applicable).

## 2018-08-26 NOTE — Assessment & Plan Note (Signed)
PAF documented in 2015, recurrent 2018, Aug 2019, and today

## 2018-08-26 NOTE — Assessment & Plan Note (Addendum)
CABG x 3, LIMA-LAD, VG-OM, VG-PDA 01/15/13  Myoview low risk 2017

## 2018-08-26 NOTE — Assessment & Plan Note (Signed)
Seen today as an add on for increased DOE for the past 1-2 weeks

## 2018-08-26 NOTE — Assessment & Plan Note (Signed)
On statin Rx 

## 2018-08-26 NOTE — Progress Notes (Signed)
08/26/2018 Derek Wells   December 29, 1932  [id number]  Primary Physician Marden Noble, MD Primary Cardiologist: Dr Tresa Endo  HPI: Derek Wells is a pleasant 84 year old male followed by Dr. Tresa Endo with a history of coronary disease and atrial fibrillation.  He had bypass surgery x3 in 2014.  Myoview study done in 2017 was low risk.  In 2015 he had PAF and was cardioverted and placed on Xarelto.  He had recurrent atrial fibrillation in 2018 was cardioverted again.  In August 2019 he again had atrial fibrillation but converted on his own before cardioversion was arranged.  He is followed in the atrial fibrillation.  He saw Dr. Tresa Endo in September 2019.  At that time his amlodipine was changed to diltiazem.  Patient called the office 24 hours ago saying he is noted increasing fatigue and shortness of breath with exertion.  He has a day said he was walking [place] the [place] and almost could not make it back from just walking 3 holes.  He denies chest pain.  Denies orthopnea.  His weight is up about 4 pounds from his usual weight.  He denies any palpitations or tachycardia.  He denies any syncope or presyncope.   Current Outpatient Medications  Medication Sig Dispense Refill  . atorvastatin (LIPITOR) 40 MG tablet TAKE 1 TABLET BY MOUTH ONCE DAILY (Patient taking differently: Take 40 mg by mouth at bedtime. ) 90 tablet 3  . ELIQUIS 5 MG TABS tablet TAKE 1 TABLET BY MOUTH TWICE DAILY (Patient taking differently: Take 5 mg by mouth 2 (two) times daily. ) 60 tablet 6  . irbesartan (AVAPRO) 150 MG tablet Take 1 tablet (150 mg total) by mouth daily. 90 tablet 1  . metFORMIN (GLUCOPHAGE) 500 MG tablet Take 500 mg by mouth daily with breakfast.     . metoprolol succinate (TOPROL-XL) 25 MG 24 hr tablet TAKE 1/2 TABLET(12.5 MG) BY MOUTH DAILY 30 tablet 6  . Multiple Vitamin (MULTIVITAMIN WITH MINERALS) TABS Take 1 tablet by mouth daily.    . furosemide (LASIX) 20 MG tablet Take 1 tablet (20 mg total) by mouth daily. 30  tablet 2  . predniSONE (DELTASONE) 5 MG tablet Take 5 mg by mouth daily with breakfast.     No current facility-administered medications for this visit.     Allergies  Allergen Reactions  . Penicillins Rash    Has patient had a PCN reaction causing immediate rash, facial/tongue/throat swelling, SOB or lightheadedness with hypotension: no Has patient had a PCN reaction causing severe rash involving mucus membranes or skin necrosis: no Has patient had a PCN reaction that required hospitalization: no Has patient had a PCN reaction occurring within the last 10 years: yes If all of the above answers are "NO", then may proceed with Cephalosporin use.     Past Medical History:  Diagnosis Date  . CAD (coronary artery disease), severe needing CABG 01/19/2013  . Chest pain    2D ECHO, 06/16/2012 - EF 50-55%, borderline low left ventricular systolic function  . Coronary artery disease   . Dyspnea on exertion    LEXISCAN, 06/17/2012 - fixed inferior bowel artifact and basal septal defect, probably related to IVCD/incomplete LBBB, no reversible ischemia, post-stress EF 66%  . History of AAA (abdominal aortic aneurysm) repair    ABDOMINAL AORTIC DOPPLER, 06/16/2013 - normal  . HTN (hypertension) 01/19/2013  . Hyperlipidemia LDL goal < 70 01/19/2013  . Hypertension   . S/P CABG x 3 01/19/2013    Social  History   Socioeconomic History  . Marital status: Married    Spouse name: Not on file  . Number of children: Not on file  . Years of education: Not on file  . Highest education level: Not on file  Occupational History  . Not on file  Social Needs  . Financial resource strain: Not on file  . Food insecurity:    Worry: Not on file    Inability: Not on file  . Transportation needs:    Medical: Not on file    Non-medical: Not on file  Tobacco Use  . Smoking status: Former Smoker    Types: Cigars    Last attempt to quit: 01/16/2013    Years since quitting: 5.6  . Smokeless tobacco: Never  Used  Substance and Sexual Activity  . Alcohol use: Yes    Alcohol/week: 14.0 standard drinks    Types: 14 Standard drinks or equivalent per week  . Drug use: No  . Sexual activity: Not on file  Lifestyle  . Physical activity:    Days per week: Not on file    Minutes per session: Not on file  . Stress: Not on file  Relationships  . Social connections:    Talks on phone: Not on file    Gets together: Not on file    Attends religious service: Not on file    Active member of club or organization: Not on file    Attends meetings of clubs or organizations: Not on file    Relationship status: Not on file  . Intimate partner violence:    Fear of current or ex partner: Not on file    Emotionally abused: Not on file    Physically abused: Not on file    Forced sexual activity: Not on file  Other Topics Concern  . Not on file  Social History Narrative  . Not on file     Family History  Problem Relation Age of Onset  . Hyperlipidemia Mother   . Heart disease Mother   . Heart disease Father   . Hypertension Father   . Heart disease Brother      Review of Systems: General: negative for chills, fever, night sweats or weight changes.  Cardiovascular: negative for chest pain, dyspnea on exertion, edema, orthopnea, palpitations, paroxysmal nocturnal dyspnea or shortness of breath Dermatological: negative for rash Respiratory: negative for cough or wheezing Urologic: negative for hematuria Abdominal: negative for nausea, vomiting, diarrhea, bright red blood per rectum, melena, or hematemesis Neurologic: negative for visual changes, syncope, or dizziness All other systems reviewed and are otherwise negative except as noted above.    Blood pressure (!) 148/62, pulse (!) 57, height 6' 1.5" (1.867 m), weight 247 lb (112 kg).  General appearance: alert, cooperative, no distress and mildly obese Neck: no JVD and transmitted murmur Lungs: clear to auscultation bilaterally Heart:  irregularly irregular rhythm with 2/6 systolic murmur AOV and 1-2/6 MR murmur Extremities: no edema Skin: warm and dry Neurologic: Grossly normal  EKG junctional rhythm vs AF with slow VR and PVCs- rate 53  ASSESSMENT AND PLAN:   DOE (dyspnea on exertion) Seen today as an add on for increased DOE for the past 1-2 weeks  Hx of CABG CABG x 3, LIMA-LAD, VG-OM, VG-PDA 01/15/13  Myoview low risk 2017  S/P AAA repair 2010  Persistent atrial fibrillation PAF documented in 2015, recurrent 2018, Aug 2019, and today  Anticoagulated by anticoagulation treatment Eliquis CHADS VASC=5  Hyperlipidemia with target  LDL less than 70 On statin Rx  HTN (hypertension) Stop Diltiazem, add low dose diuretic  Mild aortic stenosis Nl LVF by echo Oct 2019   PLAN His rhythm could be slow AF with PVCs or possibly junctional with PVCs. I suspect his symptoms are from chronotropic incompetence and mild volume overload. I'll stop Derek Wells's diltiazem.  I added low-dose Lasix.  I'll arrange for him follow-up in atrial fibrillation clinic next week.  He knows to go to the emergency room if his symptoms get worse.  Corine ShelterLuke Ziyan Hillmer PA-C 08/26/2018 11:35 AM

## 2018-08-26 NOTE — Assessment & Plan Note (Signed)
Stop Diltiazem, add low dose diuretic

## 2018-08-26 NOTE — Assessment & Plan Note (Signed)
Eliquis- CHADS VASC=5 

## 2018-08-26 NOTE — Assessment & Plan Note (Signed)
Nl LVF by echo Oct 2019

## 2018-08-28 ENCOUNTER — Encounter (HOSPITAL_COMMUNITY)
Admission: RE | Admit: 2018-08-28 | Discharge: 2018-08-28 | Disposition: A | Discharge status: Home / Self Care | Payer: Self-pay | Source: Ambulatory Visit | Attending: Cardiovascular Disease | Admitting: Cardiovascular Disease

## 2018-08-31 ENCOUNTER — Other Ambulatory Visit: Payer: Self-pay

## 2018-08-31 ENCOUNTER — Ambulatory Visit (HOSPITAL_COMMUNITY)
Admission: RE | Admit: 2018-08-31 | Discharge: 2018-08-31 | Disposition: A | Discharge status: Home / Self Care | Payer: Medicare Other | Source: Ambulatory Visit | Attending: Nurse Practitioner | Admitting: Nurse Practitioner

## 2018-08-31 ENCOUNTER — Encounter (HOSPITAL_COMMUNITY)
Admission: RE | Admit: 2018-08-31 | Discharge: 2018-08-31 | Disposition: A | Discharge status: Home / Self Care | Payer: Self-pay | Source: Ambulatory Visit | Attending: Cardiovascular Disease | Admitting: Cardiovascular Disease

## 2018-08-31 ENCOUNTER — Encounter (HOSPITAL_COMMUNITY): Payer: Self-pay | Admitting: Nurse Practitioner

## 2018-08-31 VITALS — BP 142/74 | HR 64 | Ht 73.5 in | Wt 244.6 lb

## 2018-08-31 DIAGNOSIS — Z7901 Long term (current) use of anticoagulants: Secondary | ICD-10-CM | POA: Diagnosis not present

## 2018-08-31 DIAGNOSIS — I4891 Unspecified atrial fibrillation: Secondary | ICD-10-CM | POA: Diagnosis not present

## 2018-08-31 DIAGNOSIS — I4892 Unspecified atrial flutter: Secondary | ICD-10-CM | POA: Diagnosis not present

## 2018-08-31 DIAGNOSIS — Z79899 Other long term (current) drug therapy: Secondary | ICD-10-CM | POA: Diagnosis not present

## 2018-08-31 DIAGNOSIS — Z951 Presence of aortocoronary bypass graft: Secondary | ICD-10-CM | POA: Diagnosis not present

## 2018-08-31 DIAGNOSIS — I10 Essential (primary) hypertension: Secondary | ICD-10-CM | POA: Insufficient documentation

## 2018-08-31 DIAGNOSIS — Z8249 Family history of ischemic heart disease and other diseases of the circulatory system: Secondary | ICD-10-CM | POA: Insufficient documentation

## 2018-08-31 DIAGNOSIS — Z7984 Long term (current) use of oral hypoglycemic drugs: Secondary | ICD-10-CM | POA: Diagnosis not present

## 2018-08-31 DIAGNOSIS — I4819 Other persistent atrial fibrillation: Secondary | ICD-10-CM

## 2018-08-31 DIAGNOSIS — I2510 Atherosclerotic heart disease of native coronary artery without angina pectoris: Secondary | ICD-10-CM | POA: Insufficient documentation

## 2018-08-31 DIAGNOSIS — E785 Hyperlipidemia, unspecified: Secondary | ICD-10-CM | POA: Diagnosis not present

## 2018-08-31 DIAGNOSIS — I447 Left bundle-branch block, unspecified: Secondary | ICD-10-CM | POA: Insufficient documentation

## 2018-08-31 DIAGNOSIS — Z87891 Personal history of nicotine dependence: Secondary | ICD-10-CM | POA: Diagnosis not present

## 2018-08-31 NOTE — Progress Notes (Signed)
Primary Care Physician: Marden Noble, MD Referring Physician: [facility] cardiac rehab   Derek Wells is a 84 y.o. male with a h/o CAD, s/p bypass in 2014,HTN, remote afib that was seen in the afib clinic, 04/15/17, for f/u of afib found in cardiac rehab yesterday. He remains in rate controlled afib today but is asymptomatic. He gives a h/o of a drop in h/h while on anticoagulation for afib in 2016 and anticoagulation was stopped. He had an upper endoscopy and colonoscopy without any source of bleeding. He reports that H/H stabilized with addition of iron. He  has a chadsvasc score of at least 4.  Pt believes that onset of afib may have been due to stress from a family situation and having family members in last week and drinking heavier amounts of alcohol. He feels well in rate controlled afuib and did not want any further intervention. He was started on anticoagulation.  F/u in afib clinic 04/23/17. Still is asymptomatic in afb but has noted heart rate to be running in low 50's/upper 40's at times. He has been back to cardiac rehab and did well exercising. Slightly sluggish when heart rate is low. CBC pending today with h/o prior anemia without bleeding source. No obvious bleeding with start of eliquis.  F/u in afib clinic 8/17 at pt request. He reports that he can not take a good deep breath and he is waking up at night and can not get back to sleep. Fluid status is normal, actually down 2 lbs. He has not noted any bleeding. He remains in afib at 62 bpm. Stat CBC with normal blood count. He feels that the afib may be getting to him and is now willing to have a cardioversion. He had one in the remote past which did keep him in rhythm for some time.  F/u after cardioversion, he had successful DCCV and is holding in SR. He feels better with breathing easier and sleeping better at night. Wife states that he still drinks more alcohol than he should. He can return to cardiac rehab. He has no chest tightness with  exercising at Cardiac rehab but does have some occasional chest tightness with walking with his wife. He says that this is a chronic condition  and that he has discussed with  Dr. Tresa Endo of these symptoms in the past. Last stress test in 10/17 and was low risk.  F/u in afib clinic 05/13/18. He was going up the steps to bed last pm when he was aware of a full feeling in his throat and upper chest with some radiation to upper Left shoulder. He was also aware of some palpitations. He got ready for better and after getting in bed the symptoms improved.  First episode since last year with palpitations. He felt he was out of rhythm and asked to be seen today. Ekg  confirms that he is in a rate controlled atrial flutter. He is still drinking too much alcohol usually at least 2 drinks a night. He played golf yesterday without any symptoms. He later self converted and did not require cardioversion.  F/u in afib clinic, 12/9. He reports that his amlodipine was switched to Cardizem in November with a check up with Dr. Tresa Endo. Shortly after that he developed shortness of breath to the point that it was very hard for him to be exertional. He went back to see [name], PA and chronotropic incompetence was suspected with mild volume overload and the drug was stopped, lasix was  added.  He was in a slow afib then and this appointment was made. The pt states that he feels much improved off Cardizem. He doesn't even feel the afib currently. He is rate controlled.He just came off a prednisone taper yesterday for sinus infection.  Today, he denies symptoms of palpitations, chest pain, shortness of breath, orthopnea, PND, lower extremity edema, dizziness, presyncope, syncope, or neurologic sequela. The patient is tolerating medications without difficulties and is otherwise without complaint today.   Past Medical History:  Diagnosis Date  . CAD (coronary artery disease), severe needing CABG 01/19/2013  . Chest pain    2D ECHO,  06/16/2012 - EF 50-55%, borderline low left ventricular systolic function  . Coronary artery disease   . Dyspnea on exertion    LEXISCAN, 06/17/2012 - fixed inferior bowel artifact and basal septal defect, probably related to IVCD/incomplete LBBB, no reversible ischemia, post-stress EF 66%  . History of AAA (abdominal aortic aneurysm) repair    ABDOMINAL AORTIC DOPPLER, 06/16/2013 - normal  . HTN (hypertension) 01/19/2013  . Hyperlipidemia LDL goal < 70 01/19/2013  . Hypertension   . S/P CABG x 3 01/19/2013   Past Surgical History:  Procedure Laterality Date  . CARDIAC CATHETERIZATION  01/15/2013   Severe coronary obstructive disease with 60-70% ostial left main and 90-85% distal LAD stenosis and mild-moderate stenoses in RCA, recommended for CABG  . CARDIOVERSION N/A 11/12/2013   Procedure: CARDIOVERSION;  Surgeon: Lennette Bihari, MD;  Location: [facility] ENDOSCOPY;  Service: Cardiovascular;  Laterality: N/A;  11:16 Lido 60mg , Propofol 120mg ,IV 11:19 synch 100 joules conversion conversion to NSR....  . CARDIOVERSION N/A 05/13/2017   Procedure: CARDIOVERSION;  Surgeon: Elease Hashimoto Deloris Ping, MD;  Location: [facility] ENDOSCOPY;  Service: Cardiovascular;  Laterality: N/A;  . CORONARY ARTERY BYPASS GRAFT N/A 01/15/2013   Procedure: Coronary Artery Bypass Graft times three using Right Greater Saphenous Vein Graft harvested endoscopically and Left Internal Mammary Artery and;  Surgeon: Alleen Borne, MD;  Location: [facility] OR;  Service: Open Heart Surgery;  Laterality: N/A;  . INTRAOPERATIVE TRANSESOPHAGEAL ECHOCARDIOGRAM  01/15/2013   Procedure: INTRAOPERATIVE TRANSESOPHAGEAL ECHOCARDIOGRAM;  Surgeon: Alleen Borne, MD;  Location: [facility] OR;  Service: Open Heart Surgery;;  . LEFT HEART CATHETERIZATION WITH CORONARY ANGIOGRAM N/A 01/15/2013   Procedure: LEFT HEART CATHETERIZATION WITH CORONARY ANGIOGRAM;  Surgeon: Lennette Bihari, MD;  Location[facility] CATH LAB;  Service: Cardiovascular;  Laterality: N/A;  . VASCULAR SURGERY       Current Outpatient Medications  Medication Sig Dispense Refill  . atorvastatin (LIPITOR) 40 MG tablet TAKE 1 TABLET BY MOUTH ONCE DAILY (Patient taking differently: Take 40 mg by mouth at bedtime. ) 90 tablet 3  . ELIQUIS 5 MG TABS tablet TAKE 1 TABLET BY MOUTH TWICE DAILY (Patient taking differently: Take 5 mg by mouth 2 (two) times daily. ) 60 tablet 6  . furosemide (LASIX) 20 MG tablet Take 1 tablet (20 mg total) by mouth daily. 30 tablet 2  . irbesartan (AVAPRO) 150 MG tablet Take 1 tablet (150 mg total) by mouth daily. 90 tablet 1  . metFORMIN (GLUCOPHAGE) 500 MG tablet Take 500 mg by mouth daily with breakfast.     . metoprolol succinate (TOPROL-XL) 25 MG 24 hr tablet TAKE 1/2 TABLET(12.5 MG) BY MOUTH DAILY 30 tablet 6  . Multiple Vitamin (MULTIVITAMIN WITH MINERALS) TABS Take 1 tablet by mouth daily.     No current facility-administered medications for this encounter.     Allergies  Allergen Reactions  .  Penicillins Rash    Has patient had a PCN reaction causing immediate rash, facial/tongue/throat swelling, SOB or lightheadedness with hypotension: no Has patient had a PCN reaction causing severe rash involving mucus membranes or skin necrosis: no Has patient had a PCN reaction that required hospitalization: no Has patient had a PCN reaction occurring within the last 10 years: yes If all of the above answers are "NO", then may proceed with Cephalosporin use.     Social History   Socioeconomic History  . Marital status: Married    Spouse name: Not on file  . Number of children: Not on file  . Years of education: Not on file  . Highest education level: Not on file  Occupational History  . Not on file  Social Needs  . Financial resource strain: Not on file  . Food insecurity:    Worry: Not on file    Inability: Not on file  . Transportation needs:    Medical: Not on file    Non-medical: Not on file  Tobacco Use  . Smoking status: Former Smoker    Types: Cigars     Last attempt to quit: 01/16/2013    Years since quitting: 5.6  . Smokeless tobacco: Never Used  Substance and Sexual Activity  . Alcohol use: Yes    Alcohol/week: 14.0 standard drinks    Types: 14 Standard drinks or equivalent per week  . Drug use: No  . Sexual activity: Not on file  Lifestyle  . Physical activity:    Days per week: Not on file    Minutes per session: Not on file  . Stress: Not on file  Relationships  . Social connections:    Talks on phone: Not on file    Gets together: Not on file    Attends religious service: Not on file    Active member of club or organization: Not on file    Attends meetings of clubs or organizations: Not on file    Relationship status: Not on file  . Intimate partner violence:    Fear of current or ex partner: Not on file    Emotionally abused: Not on file    Physically abused: Not on file    Forced sexual activity: Not on file  Other Topics Concern  . Not on file  Social History Narrative  . Not on file    Family History  Problem Relation Age of Onset  . Hyperlipidemia Mother   . Heart disease Mother   . Heart disease Father   . Hypertension Father   . Heart disease Brother     ROS- All systems are reviewed and negative except as per the HPI above  Physical Exam: Vitals:   08/31/18 1422  BP: (!) 142/74  Pulse: 64  Weight: 110.9 kg  Height: 6' 1.5" (1.867 m)   Wt Readings from Last 3 Encounters:  08/31/18 110.9 kg  08/26/18 112 kg  07/07/18 109.9 kg    Labs: Lab Results  Component Value Date   NA 140 05/26/2018   K 4.5 05/26/2018   CL 105 05/26/2018   CO2 23 05/26/2018   GLUCOSE 103 (H) 05/26/2018   BUN 15 05/26/2018   CREATININE 0.96 05/26/2018   CALCIUM 9.6 05/26/2018   MG 2.0 10/12/2013   Lab Results  Component Value Date   INR 1.89 (H) 11/08/2013   Lab Results  Component Value Date   CHOL 143 05/26/2018   HDL 37 (L) 05/26/2018   LDLCALC 71  05/26/2018   TRIG 173 (H) 05/26/2018   CBC  WNL  GEN- The patient is well appearing, alert and oriented x 3 today.   Head- normocephalic, atraumatic Eyes-  Sclera clear, conjunctiva pink Ears- hearing intact Oropharynx- clear Neck- supple, no JVP Lymph- no cervical lymphadenopathy Lungs- Clear to ausculation bilaterally, normal work of breathing Heart-irregular rate and rhythm, no murmurs, rubs or gallops, PMI not laterally displaced GI- soft, NT, ND, + BS Extremities- no clubbing, cyanosis, or edema MS- no significant deformity or atrophy Skin- no rash or lesion Psych- euthymic mood, full affect Neuro- strength and sensation are intact  EKG- afib at 64 bpm, qrs int 128 ms, qtc 425 ms Epic records reviewed    Assessment and Plan: 1. Paroxysmal atrial flutter Rate controlled and currently feels well Last successful cardioversion 05/13/17 Continue metoprolol at 25 1/2 tab qd Now off Cardizem, may have contributed to chronotropic  intolerance and shortness of breath, much improved off CCB He does have a chadsvasc score of 4 Continue eliquis 5 mg bid, no missed doses Discussed antiarrythmic therapy but he feels well and wants to see if he will spontaneously go back to SR with now finishing steroids   I will see back in 2 weeks   Lupita Leash C. Matthew Folks [facility] [address] [place], [place] [address] [phone]

## 2018-09-01 ENCOUNTER — Ambulatory Visit: Payer: Medicare Other | Admitting: Cardiovascular Disease

## 2018-09-02 ENCOUNTER — Encounter (HOSPITAL_COMMUNITY): Payer: Self-pay

## 2018-09-04 ENCOUNTER — Encounter (HOSPITAL_COMMUNITY)
Admission: RE | Admit: 2018-09-04 | Discharge: 2018-09-04 | Disposition: A | Discharge status: Home / Self Care | Payer: Self-pay | Source: Ambulatory Visit | Attending: Cardiovascular Disease | Admitting: Cardiovascular Disease

## 2018-09-07 ENCOUNTER — Encounter (HOSPITAL_COMMUNITY): Payer: Self-pay

## 2018-09-09 ENCOUNTER — Encounter (HOSPITAL_COMMUNITY): Payer: Self-pay

## 2018-09-11 ENCOUNTER — Encounter (HOSPITAL_COMMUNITY): Payer: Self-pay

## 2018-09-14 ENCOUNTER — Ambulatory Visit (HOSPITAL_COMMUNITY): Payer: Medicare Other | Admitting: Nurse Practitioner

## 2018-09-14 ENCOUNTER — Encounter (HOSPITAL_COMMUNITY): Payer: Self-pay

## 2018-09-18 ENCOUNTER — Encounter (HOSPITAL_COMMUNITY): Payer: Self-pay

## 2018-09-21 ENCOUNTER — Encounter (HOSPITAL_COMMUNITY): Payer: Self-pay

## 2018-09-25 ENCOUNTER — Encounter (HOSPITAL_COMMUNITY): Payer: Self-pay

## 2018-09-25 DIAGNOSIS — I2510 Atherosclerotic heart disease of native coronary artery without angina pectoris: Secondary | ICD-10-CM | POA: Insufficient documentation

## 2018-09-28 ENCOUNTER — Encounter (HOSPITAL_COMMUNITY)
Admission: RE | Admit: 2018-09-28 | Discharge: 2018-09-28 | Disposition: A | Discharge status: Home / Self Care | Payer: Self-pay | Source: Ambulatory Visit | Attending: Cardiovascular Disease | Admitting: Cardiovascular Disease

## 2018-09-30 ENCOUNTER — Encounter (HOSPITAL_COMMUNITY): Payer: Self-pay

## 2018-10-01 ENCOUNTER — Other Ambulatory Visit: Payer: Self-pay | Admitting: Cardiovascular Disease

## 2018-10-01 NOTE — Telephone Encounter (Signed)
Rx has been sent to the pharmacy electronically. ° °

## 2018-10-02 ENCOUNTER — Encounter (HOSPITAL_COMMUNITY): Payer: Self-pay

## 2018-10-05 ENCOUNTER — Encounter (HOSPITAL_COMMUNITY)
Admission: RE | Admit: 2018-10-05 | Discharge: 2018-10-05 | Disposition: A | Discharge status: Home / Self Care | Payer: Medicare Other | Source: Ambulatory Visit | Attending: Cardiovascular Disease | Admitting: Cardiovascular Disease

## 2018-10-07 ENCOUNTER — Encounter (HOSPITAL_COMMUNITY)
Admission: RE | Admit: 2018-10-07 | Discharge: 2018-10-07 | Disposition: A | Discharge status: Home / Self Care | Payer: Self-pay | Source: Ambulatory Visit | Attending: Cardiovascular Disease | Admitting: Cardiovascular Disease

## 2018-10-09 ENCOUNTER — Encounter (HOSPITAL_COMMUNITY)
Admission: RE | Admit: 2018-10-09 | Discharge: 2018-10-09 | Disposition: A | Discharge status: Home / Self Care | Payer: Self-pay | Source: Ambulatory Visit | Attending: Cardiovascular Disease | Admitting: Cardiovascular Disease

## 2018-10-12 ENCOUNTER — Encounter (HOSPITAL_COMMUNITY)
Admission: RE | Admit: 2018-10-12 | Discharge: 2018-10-12 | Disposition: A | Discharge status: Home / Self Care | Payer: Self-pay | Source: Ambulatory Visit | Attending: Cardiovascular Disease | Admitting: Cardiovascular Disease

## 2018-10-14 ENCOUNTER — Encounter (HOSPITAL_COMMUNITY): Payer: Self-pay

## 2018-10-16 ENCOUNTER — Encounter (HOSPITAL_COMMUNITY)
Admission: RE | Admit: 2018-10-16 | Discharge: 2018-10-16 | Disposition: A | Discharge status: Home / Self Care | Payer: Self-pay | Source: Ambulatory Visit | Attending: Cardiovascular Disease | Admitting: Cardiovascular Disease

## 2018-10-19 ENCOUNTER — Encounter (HOSPITAL_COMMUNITY)
Admission: RE | Admit: 2018-10-19 | Discharge: 2018-10-19 | Disposition: A | Discharge status: Home / Self Care | Payer: Self-pay | Source: Ambulatory Visit | Attending: Cardiovascular Disease | Admitting: Cardiovascular Disease

## 2018-10-19 NOTE — Progress Notes (Signed)
OUTPATIENT CARDIAC REHAB  Pt exercised at cardiac rehab non telemetry monitored exercise class. Pt noted heart rate fluctuation and asks about his current heart rhythm. Pt with known PAF. Pt placed on zoll monitor for rhythm confirmation.  Rhythm strip-atrial fibrillation, rate controlled. Pt asymptomatic. Pt reports he missed recent afib clinic appointment.  Rhythm strip faxed to Rudi Coco, NP in Afib clinic.   Deveron Furlong, RN, BSN Cardiac Pulmonary Rehab 10/19/18 4:56 PM

## 2018-10-21 ENCOUNTER — Encounter (HOSPITAL_COMMUNITY)
Admission: RE | Admit: 2018-10-21 | Discharge: 2018-10-21 | Disposition: A | Discharge status: Home / Self Care | Payer: Self-pay | Source: Ambulatory Visit | Attending: Cardiovascular Disease | Admitting: Cardiovascular Disease

## 2018-10-22 ENCOUNTER — Encounter (HOSPITAL_COMMUNITY): Payer: Self-pay | Admitting: Nurse Practitioner

## 2018-10-22 ENCOUNTER — Ambulatory Visit (HOSPITAL_COMMUNITY)
Admission: RE | Admit: 2018-10-22 | Discharge: 2018-10-22 | Disposition: A | Discharge status: Home / Self Care | Payer: Medicare Other | Source: Ambulatory Visit | Attending: Nurse Practitioner | Admitting: Nurse Practitioner

## 2018-10-22 VITALS — BP 146/74 | HR 68 | Ht 73.5 in | Wt 241.0 lb

## 2018-10-22 DIAGNOSIS — Z79899 Other long term (current) drug therapy: Secondary | ICD-10-CM | POA: Insufficient documentation

## 2018-10-22 DIAGNOSIS — Z7984 Long term (current) use of oral hypoglycemic drugs: Secondary | ICD-10-CM | POA: Insufficient documentation

## 2018-10-22 DIAGNOSIS — E785 Hyperlipidemia, unspecified: Secondary | ICD-10-CM | POA: Diagnosis not present

## 2018-10-22 DIAGNOSIS — I10 Essential (primary) hypertension: Secondary | ICD-10-CM | POA: Diagnosis not present

## 2018-10-22 DIAGNOSIS — Z87891 Personal history of nicotine dependence: Secondary | ICD-10-CM | POA: Insufficient documentation

## 2018-10-22 DIAGNOSIS — I4891 Unspecified atrial fibrillation: Secondary | ICD-10-CM | POA: Diagnosis not present

## 2018-10-22 DIAGNOSIS — I454 Nonspecific intraventricular block: Secondary | ICD-10-CM | POA: Diagnosis not present

## 2018-10-22 DIAGNOSIS — Z7901 Long term (current) use of anticoagulants: Secondary | ICD-10-CM | POA: Diagnosis not present

## 2018-10-22 DIAGNOSIS — R9431 Abnormal electrocardiogram [ECG] [EKG]: Secondary | ICD-10-CM | POA: Diagnosis not present

## 2018-10-22 DIAGNOSIS — I2510 Atherosclerotic heart disease of native coronary artery without angina pectoris: Secondary | ICD-10-CM | POA: Diagnosis not present

## 2018-10-22 DIAGNOSIS — I4892 Unspecified atrial flutter: Secondary | ICD-10-CM | POA: Insufficient documentation

## 2018-10-22 DIAGNOSIS — Z880 Allergy status to penicillin: Secondary | ICD-10-CM | POA: Diagnosis not present

## 2018-10-22 DIAGNOSIS — Z8249 Family history of ischemic heart disease and other diseases of the circulatory system: Secondary | ICD-10-CM | POA: Insufficient documentation

## 2018-10-22 DIAGNOSIS — Z951 Presence of aortocoronary bypass graft: Secondary | ICD-10-CM | POA: Insufficient documentation

## 2018-10-22 NOTE — Progress Notes (Signed)
Primary Care Physician: Marden Noble, MD Referring Physician: [facility] cardiac rehab   Derek Wells is a 85 y.o. male with a h/o CAD, s/p bypass in 2014,HTN, remote afib that was seen in the afib clinic, 04/15/17, for f/u of afib found in cardiac rehab yesterday. He remains in rate controlled afib today but is asymptomatic. He gives a h/o of a drop in h/h while on anticoagulation for afib in 2016 and anticoagulation was stopped. He had an upper endoscopy and colonoscopy without any source of bleeding. He reports that H/H stabilized with addition of iron. He  has a chadsvasc score of at least 4.  Pt believes that onset of afib may have been due to stress from a family situation and having family members in last week and drinking heavier amounts of alcohol. He feels well in rate controlled afuib and did not want any further intervention. He was started on anticoagulation.  F/u in afib clinic 04/23/17. Still is asymptomatic in afb but has noted heart rate to be running in low 50's/upper 40's at times. He has been back to cardiac rehab and did well exercising. Slightly sluggish when heart rate is low. CBC pending today with h/o prior anemia without bleeding source. No obvious bleeding with start of eliquis.  F/u in afib clinic 8/17 at pt request. He reports that he can not take a good deep breath and he is waking up at night and can not get back to sleep. Fluid status is normal, actually down 2 lbs. He has not noted any bleeding. He remains in afib at 62 bpm. Stat CBC with normal blood count. He feels that the afib may be getting to him and is now willing to have a cardioversion. He had one in the remote past which did keep him in rhythm for some time.  F/u after cardioversion, he had successful DCCV and is holding in SR. He feels better with breathing easier and sleeping better at night. Wife states that he still drinks more alcohol than he should. He can return to cardiac rehab. He has no chest tightness with  exercising at Cardiac rehab but does have some occasional chest tightness with walking with his wife. He says that this is a chronic condition  and that he has discussed with  Dr. Tresa Endo of these symptoms in the past. Last stress test in 10/17 and was low risk.  F/u in afib clinic 05/13/18. He was going up the steps to bed last pm when he was aware of a full feeling in his throat and upper chest with some radiation to upper Left shoulder. He was also aware of some palpitations. He got ready for better and after getting in bed the symptoms improved.  First episode since last year with palpitations. He felt he was out of rhythm and asked to be seen today. Ekg  confirms that he is in a rate controlled atrial flutter. He is still drinking too much alcohol usually at least 2 drinks a night. He played golf yesterday without any symptoms. He later self converted and did not require cardioversion.  F/u in afib clinic, 12/9. He reports that his amlodipine was switched to Cardizem in November with a check up with Dr. Tresa Endo. Shortly after that he developed shortness of breath to the point that it was very hard for him to be exertional. He went back to see [name], PA and chronotropic incompetence was suspected with mild volume overload and the drug was stopped, lasix was  added.  He was in a slow afib then and this appointment was made. The pt states that he feels much improved off Cardizem. He doesn't even feel the afib currently. He is rate controlled.He just came off a prednisone taper yesterday for sinus infection.  F/u afib clinic 1/30/20220. I am seeing Derek Wells as Derek Wells was documented at cardiac rehab. He is in afib today, rate controlled. Some days he is fatigued, other days he feels great. He did get a [name] but has not thought to try to correlate his strips to how he is feeling. We discussed antiarrythmic's but he Is not ready.   Today, he denies symptoms of palpitations, chest pain, shortness of breath,  orthopnea, PND, lower extremity edema, dizziness, presyncope, syncope, or neurologic sequela. The patient is tolerating medications without difficulties and is otherwise without complaint today.   Past Medical History:  Diagnosis Date  . CAD (coronary artery disease), severe needing CABG 01/19/2013  . Chest pain    2D ECHO, 06/16/2012 - EF 50-55%, borderline low left ventricular systolic function  . Coronary artery disease   . Dyspnea on exertion    LEXISCAN, 06/17/2012 - fixed inferior bowel artifact and basal septal defect, probably related to IVCD/incomplete LBBB, no reversible ischemia, post-stress EF 66%  . History of AAA (abdominal aortic aneurysm) repair    ABDOMINAL AORTIC DOPPLER, 06/16/2013 - normal  . HTN (hypertension) 01/19/2013  . Hyperlipidemia LDL goal < 70 01/19/2013  . Hypertension   . S/P CABG x 3 01/19/2013   Past Surgical History:  Procedure Laterality Date  . CARDIAC CATHETERIZATION  01/15/2013   Severe coronary obstructive disease with 60-70% ostial left main and 90-85% distal LAD stenosis and mild-moderate stenoses in RCA, recommended for CABG  . CARDIOVERSION N/A 11/12/2013   Procedure: CARDIOVERSION;  Surgeon: Lennette Bihari, MD;  Location: [facility] ENDOSCOPY;  Service: Cardiovascular;  Laterality: N/A;  11:16 Lido 60mg , Propofol 120mg ,IV 11:19 synch 100 joules conversion conversion to NSR....  . CARDIOVERSION N/A 05/13/2017   Procedure: CARDIOVERSION;  Surgeon: Elease Hashimoto Deloris Ping, MD;  Location: [facility] ENDOSCOPY;  Service: Cardiovascular;  Laterality: N/A;  . CORONARY ARTERY BYPASS GRAFT N/A 01/15/2013   Procedure: Coronary Artery Bypass Graft times three using Right Greater Saphenous Vein Graft harvested endoscopically and Left Internal Mammary Artery and;  Surgeon: Alleen Borne, MD;  Location: [facility] OR;  Service: Open Heart Surgery;  Laterality: N/A;  . INTRAOPERATIVE TRANSESOPHAGEAL ECHOCARDIOGRAM  01/15/2013   Procedure: INTRAOPERATIVE TRANSESOPHAGEAL ECHOCARDIOGRAM;  Surgeon: Alleen Borne, MD;  Location: [facility] OR;  Service: Open Heart Surgery;;  . LEFT HEART CATHETERIZATION WITH CORONARY ANGIOGRAM N/A 01/15/2013   Procedure: LEFT HEART CATHETERIZATION WITH CORONARY ANGIOGRAM;  Surgeon: Lennette Bihari, MD;  Location: [facility] CATH LAB;  Service: Cardiovascular;  Laterality: N/A;  . VASCULAR SURGERY      Current Outpatient Medications  Medication Sig Dispense Refill  . atorvastatin (LIPITOR) 40 MG tablet TAKE 1 TABLET BY MOUTH ONCE DAILY 90 tablet 3  . ELIQUIS 5 MG TABS tablet TAKE 1 TABLET BY MOUTH TWICE DAILY 60 tablet 6  . furosemide (LASIX) 20 MG tablet Take 1 tablet (20 mg total) by mouth daily. 30 tablet 2  . irbesartan (AVAPRO) 150 MG tablet TAKE 1 TABLET(150 MG) BY MOUTH DAILY 90 tablet 2  . metFORMIN (GLUCOPHAGE) 500 MG tablet Take 500 mg by mouth daily with breakfast.     . metoprolol succinate (TOPROL-XL) 25 MG 24 hr tablet TAKE 1/2 TABLET(12.5 MG) BY MOUTH DAILY 30  tablet 6  . Multiple Vitamin (MULTIVITAMIN WITH MINERALS) TABS Take 1 tablet by mouth daily.     No current facility-administered medications for this encounter.     Allergies  Allergen Reactions  . Penicillins Rash    Has patient had a PCN reaction causing immediate rash, facial/tongue/throat swelling, SOB or lightheadedness with hypotension: no Has patient had a PCN reaction causing severe rash involving mucus membranes or skin necrosis: no Has patient had a PCN reaction that required hospitalization: no Has patient had a PCN reaction occurring within the last 10 years: yes If all of the above answers are "NO", then may proceed with Cephalosporin use.     Social History   Socioeconomic History  . Marital status: Married    Spouse name: Not on file  . Number of children: Not on file  . Years of education: Not on file  . Highest education level: Not on file  Occupational History  . Not on file  Social Needs  . Financial resource strain: Not on file  . Food insecurity:    Worry: Not on  file    Inability: Not on file  . Transportation needs:    Medical: Not on file    Non-medical: Not on file  Tobacco Use  . Smoking status: Former Smoker    Types: Cigars    Last attempt to quit: 01/16/2013    Years since quitting: 5.7  . Smokeless tobacco: Never Used  Substance and Sexual Activity  . Alcohol use: Yes    Alcohol/week: 14.0 standard drinks    Types: 14 Standard drinks or equivalent per week  . Drug use: No  . Sexual activity: Not on file  Lifestyle  . Physical activity:    Days per week: Not on file    Minutes per session: Not on file  . Stress: Not on file  Relationships  . Social connections:    Talks on phone: Not on file    Gets together: Not on file    Attends religious service: Not on file    Active member of club or organization: Not on file    Attends meetings of clubs or organizations: Not on file    Relationship status: Not on file  . Intimate partner violence:    Fear of current or ex partner: Not on file    Emotionally abused: Not on file    Physically abused: Not on file    Forced sexual activity: Not on file  Other Topics Concern  . Not on file  Social History Narrative  . Not on file    Family History  Problem Relation Age of Onset  . Hyperlipidemia Mother   . Heart disease Mother   . Heart disease Father   . Hypertension Father   . Heart disease Brother     ROS- All systems are reviewed and negative except as per the HPI above  Physical Exam: Vitals:   10/22/18 1030  BP: (!) 146/74  Pulse: 68  Weight: 109.3 kg  Height: 6' 1.5" (1.867 m)   Wt Readings from Last 3 Encounters:  10/22/18 109.3 kg  08/31/18 110.9 kg  08/26/18 112 kg    Labs: Lab Results  Component Value Date   NA 140 05/26/2018   K 4.5 05/26/2018   CL 105 05/26/2018   CO2 23 05/26/2018   GLUCOSE 103 (H) 05/26/2018   BUN 15 05/26/2018   CREATININE 0.96 05/26/2018   CALCIUM 9.6 05/26/2018   MG 2.0 10/12/2013  Lab Results  Component Value Date    INR 1.89 (H) 11/08/2013   Lab Results  Component Value Date   CHOL 143 05/26/2018   HDL 37 (L) 05/26/2018   LDLCALC 71 05/26/2018   TRIG 173 (H) 05/26/2018   CBC WNL  GEN- The patient is well appearing, alert and oriented x 3 today.   Head- normocephalic, atraumatic Eyes-  Sclera clear, conjunctiva pink Ears- hearing intact Oropharynx- clear Neck- supple, no JVP Lymph- no cervical lymphadenopathy Lungs- Clear to ausculation bilaterally, normal work of breathing Heart-irregular rate and rhythm, no murmurs, rubs or gallops, PMI not laterally displaced GI- soft, NT, ND, + BS Extremities- no clubbing, cyanosis, or edema MS- no significant deformity or atrophy Skin- no rash or lesion Psych- euthymic mood, full affect Neuro- strength and sensation are intact  EKG- afib at 64 bpm, qrs int 128 ms, qtc 425 ms Epic records reviewed    Assessment and Plan: 1. Paroxysmal atrial flutter Rate controlled and currently feels well, I am not sure if he is persistent or paroxysmal Encouraged to use his Lourena SimmondsKardia to correlate burden/ symptoms Continue metoprolol at 1/2 tab qd Cardizem in the past may have contributed to chronotropic  intolerance and shortness of breath, much improved off CCB He does have a chadsvasc score of 4 Continue eliquis 5 mg bid, states no missed doses Discussed antiarrythmic therapy, not 1C's for BBB, first degree block at baseline, primarily discussed amiodarone or tikosyn as his options He will think about it and call us if interested in starting antiarrythmic's  I will see back in 2 weeks   Lupita LeashDonna C. Matthew Folksarroll, ANP-C [facility] [address] [place], [place] [id number] [phone]

## 2018-10-23 ENCOUNTER — Encounter (HOSPITAL_COMMUNITY)
Admission: RE | Admit: 2018-10-23 | Discharge: 2018-10-23 | Disposition: A | Discharge status: Home / Self Care | Payer: Self-pay | Source: Ambulatory Visit | Attending: Cardiovascular Disease | Admitting: Cardiovascular Disease

## 2018-10-28 ENCOUNTER — Encounter (HOSPITAL_COMMUNITY): Payer: Self-pay

## 2018-10-28 DIAGNOSIS — I2510 Atherosclerotic heart disease of native coronary artery without angina pectoris: Secondary | ICD-10-CM | POA: Insufficient documentation

## 2018-10-29 ENCOUNTER — Other Ambulatory Visit (HOSPITAL_COMMUNITY): Payer: Self-pay | Admitting: Nurse Practitioner

## 2018-10-29 ENCOUNTER — Telehealth (HOSPITAL_COMMUNITY): Payer: Self-pay | Admitting: *Deleted

## 2018-10-29 NOTE — Telephone Encounter (Signed)
Patient called in stating he feels to be back in NSR according to [place]. He wants to hold off on making any medication changes at this point.

## 2018-10-30 ENCOUNTER — Encounter (HOSPITAL_COMMUNITY): Payer: Self-pay

## 2018-11-02 ENCOUNTER — Encounter (HOSPITAL_COMMUNITY): Payer: Self-pay

## 2018-11-04 ENCOUNTER — Encounter (HOSPITAL_COMMUNITY)
Admission: RE | Admit: 2018-11-04 | Discharge: 2018-11-04 | Disposition: A | Discharge status: Home / Self Care | Payer: Self-pay | Source: Ambulatory Visit | Attending: Cardiovascular Disease | Admitting: Cardiovascular Disease

## 2018-11-05 ENCOUNTER — Telehealth: Payer: Self-pay | Admitting: Cardiovascular Disease

## 2018-11-05 NOTE — Telephone Encounter (Signed)
Returned call to pt. He states the A fib clinic is wanting to start him on Tikosyn to help control his Afib and would like Dr. Landry Dyke recommendation. Will route to MD.  Pt also wanted to move April appointment up. Appt scheduled for 12/15/18 at 1120 with Dr. Tresa Endo

## 2018-11-05 NOTE — Telephone Encounter (Signed)
Patient is called because the A-Fib clinic wants to put him on Tikosyn, he wants to know if it's ok to be on this.  He would like to have Dr. Landry Dyke advice on this.

## 2018-11-05 NOTE — Telephone Encounter (Signed)
If Tikosyn is to be started, he will need to be hospitalized for institution of therapy

## 2018-11-06 ENCOUNTER — Encounter (HOSPITAL_COMMUNITY)
Admission: RE | Admit: 2018-11-06 | Discharge: 2018-11-06 | Disposition: A | Discharge status: Home / Self Care | Payer: Self-pay | Source: Ambulatory Visit | Attending: Cardiovascular Disease | Admitting: Cardiovascular Disease

## 2018-11-06 NOTE — Telephone Encounter (Signed)
Spoke with pt. Pt made aware of Dr.Kelly's response. Pt sts that he understands but he trust Dr.Kelly's opinion would appreciate Dr.Kelly's thoughts and recommendation. Adv pt that I will fwd the message back to Dr.Kelly. The pt voiced appreciation.

## 2018-11-06 NOTE — Telephone Encounter (Signed)
Left message to call back  

## 2018-11-06 NOTE — Telephone Encounter (Signed)
Since the A. fib clinic had recommended Tikosyn, I would check back with them to see if they would want to consider other alternatives.

## 2018-11-06 NOTE — Telephone Encounter (Signed)
Returned call to pt advising of Dr. Landry Dyke recommendation. Pt states he wanted to know if there are any other alternative to Tikosyn. He states he contacted his pharmacy and it would be expensive. He also report yesterday his HR dropped in the high 40's but came back up to 60's-80's. He states he can tell when it drops be he gets tired. Will route to MD.

## 2018-11-09 ENCOUNTER — Encounter (HOSPITAL_COMMUNITY)
Admission: RE | Admit: 2018-11-09 | Discharge: 2018-11-09 | Disposition: A | Discharge status: Home / Self Care | Payer: Self-pay | Source: Ambulatory Visit | Attending: Cardiovascular Disease | Admitting: Cardiovascular Disease

## 2018-11-09 NOTE — Telephone Encounter (Signed)
° °  Follow Up    Patient requesting follow up call regarding Tikosyn

## 2018-11-09 NOTE — Telephone Encounter (Signed)
Returned pt call. Lmom. The message was previously sent to [facility] and we are awaiting his response. Pt is to call back in the meantime if any questions or concerns.

## 2018-11-10 NOTE — Telephone Encounter (Signed)
Called patient, advised for him to call AFIB clinic to discuss other alternatives. Patient was given explination that they communicate regarding his care, and if AFIB clinic had questions regarding further alternatives they would reach out to [facility], also advised that Dr.Kelly is out of the office this week and will return next week, he could get an answer from AFIB clinic, and then advised with [facility] regarding those options if he wished.  Patient will contact AFIB clinic first, and then contact us if needed.

## 2018-11-11 ENCOUNTER — Encounter (HOSPITAL_COMMUNITY)
Admission: RE | Admit: 2018-11-11 | Discharge: 2018-11-11 | Disposition: A | Discharge status: Home / Self Care | Payer: Self-pay | Source: Ambulatory Visit | Attending: Cardiovascular Disease | Admitting: Cardiovascular Disease

## 2018-11-12 ENCOUNTER — Ambulatory Visit (HOSPITAL_COMMUNITY): Payer: Medicare Other | Admitting: Nurse Practitioner

## 2018-11-13 ENCOUNTER — Encounter (HOSPITAL_COMMUNITY): Payer: Self-pay

## 2018-11-16 ENCOUNTER — Encounter (HOSPITAL_COMMUNITY)
Admission: RE | Admit: 2018-11-16 | Discharge: 2018-11-16 | Disposition: A | Discharge status: Home / Self Care | Payer: Self-pay | Source: Ambulatory Visit | Attending: Cardiovascular Disease | Admitting: Cardiovascular Disease

## 2018-11-17 ENCOUNTER — Ambulatory Visit (HOSPITAL_COMMUNITY)
Admission: RE | Admit: 2018-11-17 | Discharge: 2018-11-17 | Disposition: A | Discharge status: Home / Self Care | Payer: Medicare Other | Source: Ambulatory Visit | Attending: Nurse Practitioner | Admitting: Nurse Practitioner

## 2018-11-17 ENCOUNTER — Encounter (HOSPITAL_COMMUNITY): Payer: Self-pay | Admitting: Nurse Practitioner

## 2018-11-17 VITALS — BP 134/64 | HR 60 | Ht 73.5 in | Wt 238.0 lb

## 2018-11-17 DIAGNOSIS — Z79899 Other long term (current) drug therapy: Secondary | ICD-10-CM | POA: Insufficient documentation

## 2018-11-17 DIAGNOSIS — Z951 Presence of aortocoronary bypass graft: Secondary | ICD-10-CM | POA: Insufficient documentation

## 2018-11-17 DIAGNOSIS — I4819 Other persistent atrial fibrillation: Secondary | ICD-10-CM | POA: Diagnosis not present

## 2018-11-17 DIAGNOSIS — Z880 Allergy status to penicillin: Secondary | ICD-10-CM | POA: Insufficient documentation

## 2018-11-17 DIAGNOSIS — I454 Nonspecific intraventricular block: Secondary | ICD-10-CM | POA: Diagnosis not present

## 2018-11-17 DIAGNOSIS — Z87891 Personal history of nicotine dependence: Secondary | ICD-10-CM | POA: Diagnosis not present

## 2018-11-17 DIAGNOSIS — Z8249 Family history of ischemic heart disease and other diseases of the circulatory system: Secondary | ICD-10-CM | POA: Diagnosis not present

## 2018-11-17 DIAGNOSIS — Z7984 Long term (current) use of oral hypoglycemic drugs: Secondary | ICD-10-CM | POA: Insufficient documentation

## 2018-11-17 DIAGNOSIS — Z8349 Family history of other endocrine, nutritional and metabolic diseases: Secondary | ICD-10-CM | POA: Diagnosis not present

## 2018-11-17 DIAGNOSIS — I10 Essential (primary) hypertension: Secondary | ICD-10-CM | POA: Insufficient documentation

## 2018-11-17 DIAGNOSIS — R9431 Abnormal electrocardiogram [ECG] [EKG]: Secondary | ICD-10-CM | POA: Diagnosis not present

## 2018-11-17 DIAGNOSIS — Z7901 Long term (current) use of anticoagulants: Secondary | ICD-10-CM | POA: Diagnosis not present

## 2018-11-17 DIAGNOSIS — I4892 Unspecified atrial flutter: Secondary | ICD-10-CM | POA: Insufficient documentation

## 2018-11-17 DIAGNOSIS — I25810 Atherosclerosis of coronary artery bypass graft(s) without angina pectoris: Secondary | ICD-10-CM | POA: Insufficient documentation

## 2018-11-17 DIAGNOSIS — E785 Hyperlipidemia, unspecified: Secondary | ICD-10-CM | POA: Diagnosis not present

## 2018-11-17 NOTE — Progress Notes (Signed)
Primary Care Physician: Marden Noble, MD Referring Physician: [facility] cardiac rehab   Derek Wells is a 85 y.o. male with a h/o CAD, s/p bypass in 2014,HTN, remote afib that was seen in the afib clinic, 04/15/17, for f/u of afib found in cardiac rehab yesterday. He remains in rate controlled afib today but is asymptomatic. He gives a h/o of a drop in h/h while on anticoagulation for afib in 2016 and anticoagulation was stopped. He had an upper endoscopy and colonoscopy without any source of bleeding. He reports that H/H stabilized with addition of iron. He  has a chadsvasc score of at least 4.  Pt believes that onset of afib may have been due to stress from a family situation and having family members in last week and drinking heavier amounts of alcohol. He feels well in rate controlled afuib and did not want any further intervention. He was started on anticoagulation.  F/u in afib clinic 04/23/17. Still is asymptomatic in afb but has noted heart rate to be running in low 50's/upper 40's at times. He has been back to cardiac rehab and did well exercising. Slightly sluggish when heart rate is low. CBC pending today with h/o prior anemia without bleeding source. No obvious bleeding with start of eliquis.  F/u in afib clinic 8/17 at pt request. He reports that he can not take a good deep breath and he is waking up at night and can not get back to sleep. Fluid status is normal, actually down 2 lbs. He has not noted any bleeding. He remains in afib at 62 bpm. Stat CBC with normal blood count. He feels that the afib may be getting to him and is now willing to have a cardioversion. He had one in the remote past which did keep him in rhythm for some time.  F/u after cardioversion, he had successful DCCV and is holding in SR. He feels better with breathing easier and sleeping better at night. Wife states that he still drinks more alcohol than he should. He can return to cardiac rehab. He has no chest tightness with  exercising at Cardiac rehab but does have some occasional chest tightness with walking with his wife. He says that this is a chronic condition  and that he has discussed with  Dr. Tresa Endo of these symptoms in the past. Last stress test in 10/17 and was low risk.  F/u in afib clinic 05/13/18. He was going up the steps to bed last pm when he was aware of a full feeling in his throat and upper chest with some radiation to upper Left shoulder. He was also aware of some palpitations. He got ready for better and after getting in bed the symptoms improved.  First episode since last year with palpitations. He felt he was out of rhythm and asked to be seen today. Ekg  confirms that he is in a rate controlled atrial flutter. He is still drinking too much alcohol usually at least 2 drinks a night. He played golf yesterday without any symptoms. He later self converted and did not require cardioversion.  F/u in afib clinic, 12/9. He reports that his amlodipine was switched to Cardizem in November with a check up with Dr. Tresa Endo. Shortly after that he developed shortness of breath to the point that it was very hard for him to be exertional. He went back to see [name], PA and chronotropic incompetence was suspected with mild volume overload and the drug was stopped, lasix was  added.  He was in a slow afib then and this appointment was made. The pt states that he feels much improved off Cardizem. He doesn't even feel the afib currently. He is rate controlled.He just came off a prednisone taper yesterday for sinus infection.  F/u afib clinic 1/30/20220. I am seeing Derek Wells as Derek Wells was documented at cardiac rehab. He is in afib today, rate controlled. Some days he is fatigued, other days he feels great. He did get a [name] but has not thought to try to correlate his strips to how he is feeling. We discussed antiarrythmic's but he Is not ready.   F/u in afib clinic 2/25. He wanted to discuss afib options again. I believe by  his Kardia readings that he has been persistently in afib since I saw him last. He is concerned re some HR readings on his [name] showed HR around 48 bpm. He did not feel well Sunday and thought it might be from afib but I reviewed strips and he did not have any excessively slow afib or rapid afib. He wants to come off BB but with h/o heart disease, I would want to have Dr. Tresa Endo address that.  I feel his best antiarrythmic choice would   be tikosyn vrs amiodarone for the slowing effect of amiodarone.   Today, he denies symptoms of palpitations, chest pain, shortness of breath, orthopnea, PND, lower extremity edema, dizziness, presyncope, syncope, or neurologic sequela. The patient is tolerating medications without difficulties and is otherwise without complaint today.   Past Medical History:  Diagnosis Date  . CAD (coronary artery disease), severe needing CABG 01/19/2013  . Chest pain    2D ECHO, 06/16/2012 - EF 50-55%, borderline low left ventricular systolic function  . Coronary artery disease   . Dyspnea on exertion    LEXISCAN, 06/17/2012 - fixed inferior bowel artifact and basal septal defect, probably related to IVCD/incomplete LBBB, no reversible ischemia, post-stress EF 66%  . History of AAA (abdominal aortic aneurysm) repair    ABDOMINAL AORTIC DOPPLER, 06/16/2013 - normal  . HTN (hypertension) 01/19/2013  . Hyperlipidemia LDL goal < 70 01/19/2013  . Hypertension   . S/P CABG x 3 01/19/2013   Past Surgical History:  Procedure Laterality Date  . CARDIAC CATHETERIZATION  01/15/2013   Severe coronary obstructive disease with 60-70% ostial left main and 90-85% distal LAD stenosis and mild-moderate stenoses in RCA, recommended for CABG  . CARDIOVERSION N/A 11/12/2013   Procedure: CARDIOVERSION;  Surgeon: Lennette Bihari, MD;  Location: [facility] ENDOSCOPY;  Service: Cardiovascular;  Laterality: N/A;  11:16 Lido 60mg , Propofol 120mg ,IV 11:19 synch 100 joules conversion conversion to NSR....  .  CARDIOVERSION N/A 05/13/2017   Procedure: CARDIOVERSION;  Surgeon: Elease Hashimoto Deloris Ping, MD;  Location: [facility] ENDOSCOPY;  Service: Cardiovascular;  Laterality: N/A;  . CORONARY ARTERY BYPASS GRAFT N/A 01/15/2013   Procedure: Coronary Artery Bypass Graft times three using Right Greater Saphenous Vein Graft harvested endoscopically and Left Internal Mammary Artery and;  Surgeon: Alleen Borne, MD;  Location: [facility] OR;  Service: Open Heart Surgery;  Laterality: N/A;  . INTRAOPERATIVE TRANSESOPHAGEAL ECHOCARDIOGRAM  01/15/2013   Procedure: INTRAOPERATIVE TRANSESOPHAGEAL ECHOCARDIOGRAM;  Surgeon: Alleen Borne, MD;  Location: [facility] OR;  Service: Open Heart Surgery;;  . LEFT HEART CATHETERIZATION WITH CORONARY ANGIOGRAM N/A 01/15/2013   Procedure: LEFT HEART CATHETERIZATION WITH CORONARY ANGIOGRAM;  Surgeon: Lennette Bihari, MD;  Location: [facility] CATH LAB;  Service: Cardiovascular;  Laterality: N/A;  . VASCULAR SURGERY  Current Outpatient Medications  Medication Sig Dispense Refill  . atorvastatin (LIPITOR) 40 MG tablet TAKE 1 TABLET BY MOUTH ONCE DAILY 90 tablet 3  . ELIQUIS 5 MG TABS tablet TAKE 1 TABLET BY MOUTH TWICE DAILY 60 tablet 6  . furosemide (LASIX) 20 MG tablet Take 1 tablet (20 mg total) by mouth daily. 30 tablet 2  . irbesartan (AVAPRO) 150 MG tablet TAKE 1 TABLET(150 MG) BY MOUTH DAILY 90 tablet 2  . metFORMIN (GLUCOPHAGE) 500 MG tablet Take 500 mg by mouth daily with breakfast.     . metoprolol succinate (TOPROL-XL) 25 MG 24 hr tablet TAKE 1/2 TABLET(12.5 MG) BY MOUTH DAILY 30 tablet 6  . Multiple Vitamin (MULTIVITAMIN WITH MINERALS) TABS Take 1 tablet by mouth daily.     No current facility-administered medications for this encounter.     Allergies  Allergen Reactions  . Penicillins Rash    Has patient had a PCN reaction causing immediate rash, facial/tongue/throat swelling, SOB or lightheadedness with hypotension: no Has patient had a PCN reaction causing severe rash involving mucus membranes  or skin necrosis: no Has patient had a PCN reaction that required hospitalization: no Has patient had a PCN reaction occurring within the last 10 years: yes If all of the above answers are "NO", then may proceed with Cephalosporin use.     Social History   Socioeconomic History  . Marital status: Married    Spouse name: Not on file  . Number of children: Not on file  . Years of education: Not on file  . Highest education level: Not on file  Occupational History  . Not on file  Social Needs  . Financial resource strain: Not on file  . Food insecurity:    Worry: Not on file    Inability: Not on file  . Transportation needs:    Medical: Not on file    Non-medical: Not on file  Tobacco Use  . Smoking status: Former Smoker    Types: Cigars    Last attempt to quit: 01/16/2013    Years since quitting: 5.8  . Smokeless tobacco: Never Used  Substance and Sexual Activity  . Alcohol use: Yes    Alcohol/week: 14.0 standard drinks    Types: 14 Standard drinks or equivalent per week  . Drug use: No  . Sexual activity: Not on file  Lifestyle  . Physical activity:    Days per week: Not on file    Minutes per session: Not on file  . Stress: Not on file  Relationships  . Social connections:    Talks on phone: Not on file    Gets together: Not on file    Attends religious service: Not on file    Active member of club or organization: Not on file    Attends meetings of clubs or organizations: Not on file    Relationship status: Not on file  . Intimate partner violence:    Fear of current or ex partner: Not on file    Emotionally abused: Not on file    Physically abused: Not on file    Forced sexual activity: Not on file  Other Topics Concern  . Not on file  Social History Narrative  . Not on file    Family History  Problem Relation Age of Onset  . Hyperlipidemia Mother   . Heart disease Mother   . Heart disease Father   . Hypertension Father   . Heart disease Brother  ROS- All systems are reviewed and negative except as per the HPI above  Physical Exam: Vitals:   11/17/18 1335  BP: 134/64  Pulse: 60  Weight: 108 kg  Height: 6' 1.5" (1.867 m)   Wt Readings from Last 3 Encounters:  11/17/18 108 kg  10/22/18 109.3 kg  08/31/18 110.9 kg    Labs: Lab Results  Component Value Date   NA 140 05/26/2018   K 4.5 05/26/2018   CL 105 05/26/2018   CO2 23 05/26/2018   GLUCOSE 103 (H) 05/26/2018   BUN 15 05/26/2018   CREATININE 0.96 05/26/2018   CALCIUM 9.6 05/26/2018   MG 2.0 10/12/2013   Lab Results  Component Value Date   INR 1.89 (H) 11/08/2013   Lab Results  Component Value Date   CHOL 143 05/26/2018   HDL 37 (L) 05/26/2018   LDLCALC 71 05/26/2018   TRIG 173 (H) 05/26/2018   CBC WNL  GEN- The patient is well appearing, alert and oriented x 3 today.   Head- normocephalic, atraumatic Eyes-  Sclera clear, conjunctiva pink Ears- hearing intact Oropharynx- clear Neck- supple, no JVP Lymph- no cervical lymphadenopathy Lungs- Clear to ausculation bilaterally, normal work of breathing Heart-irregular rate and rhythm, no murmurs, rubs or gallops, PMI not laterally displaced GI- soft, NT, ND, + BS Extremities- no clubbing, cyanosis, or edema MS- no significant deformity or atrophy Skin- no rash or lesion Psych- euthymic mood, full affect Neuro- strength and sensation are intact  EKG- afib at 60 bpm, qrs int 132 ms, qtc 408 ms Epic records reviewed    Assessment and Plan: 1. Persistent  atrial fib/flutter I feel that he has been persistent for several weeks He is more interested in pursing SR  He is concerned re some low HR readings, he wants to stop BB, just on 12.5 mg Toprol, but I will leave for Dr. Tresa Endo to address as he has CAD I will place a 24 hour Zio patch on the pt to look for any issues that would indicate a PPM Continue metoprolol at 1/2 tab qd for now Cardizem in the past may have contributed to chronotropic   intolerance and shortness of breath, much improved off CCB He does have a chadsvasc score of 4 Continue eliquis 5 mg bid  Discussed antiarrythmic therapy, not 1C's for BBB, bradycardia, first degree block at baseline, primarily discussed amiodarone or tikosyn as his options Amiodarone may contribute to brady   F/u with Azalee Course, PA, 3/5 I will discuss further with pt when I see his monitor results   Elvina Sidle. [facility] [address] [place], [place] [address] [phone]

## 2018-11-18 ENCOUNTER — Encounter (HOSPITAL_COMMUNITY)
Admission: RE | Admit: 2018-11-18 | Discharge: 2018-11-18 | Disposition: A | Discharge status: Home / Self Care | Payer: Self-pay | Source: Ambulatory Visit | Attending: Cardiovascular Disease | Admitting: Cardiovascular Disease

## 2018-11-20 ENCOUNTER — Encounter (HOSPITAL_COMMUNITY)
Admission: RE | Admit: 2018-11-20 | Discharge: 2018-11-20 | Disposition: A | Discharge status: Home / Self Care | Payer: Self-pay | Source: Ambulatory Visit | Attending: Cardiovascular Disease | Admitting: Cardiovascular Disease

## 2018-11-23 ENCOUNTER — Encounter (HOSPITAL_COMMUNITY)
Admission: RE | Admit: 2018-11-23 | Discharge: 2018-11-23 | Disposition: A | Discharge status: Home / Self Care | Payer: Self-pay | Source: Ambulatory Visit | Attending: Cardiovascular Disease | Admitting: Cardiovascular Disease

## 2018-11-23 DIAGNOSIS — I2510 Atherosclerotic heart disease of native coronary artery without angina pectoris: Secondary | ICD-10-CM | POA: Insufficient documentation

## 2018-11-25 ENCOUNTER — Encounter (HOSPITAL_COMMUNITY)
Admission: RE | Admit: 2018-11-25 | Discharge: 2018-11-25 | Disposition: A | Discharge status: Home / Self Care | Payer: Self-pay | Source: Ambulatory Visit | Attending: Cardiovascular Disease | Admitting: Cardiovascular Disease

## 2018-11-26 ENCOUNTER — Encounter: Payer: Self-pay | Admitting: Physician Assistant

## 2018-11-26 ENCOUNTER — Ambulatory Visit: Payer: Medicare Other | Admitting: Physician Assistant

## 2018-11-26 VITALS — BP 162/62 | HR 64 | Ht 73.5 in | Wt 241.0 lb

## 2018-11-26 DIAGNOSIS — Z9889 Other specified postprocedural states: Secondary | ICD-10-CM

## 2018-11-26 DIAGNOSIS — I10 Essential (primary) hypertension: Secondary | ICD-10-CM | POA: Diagnosis not present

## 2018-11-26 DIAGNOSIS — E785 Hyperlipidemia, unspecified: Secondary | ICD-10-CM

## 2018-11-26 DIAGNOSIS — I25810 Atherosclerosis of coronary artery bypass graft(s) without angina pectoris: Secondary | ICD-10-CM | POA: Diagnosis not present

## 2018-11-26 DIAGNOSIS — I4811 Longstanding persistent atrial fibrillation: Secondary | ICD-10-CM

## 2018-11-26 DIAGNOSIS — Z8679 Personal history of other diseases of the circulatory system: Secondary | ICD-10-CM

## 2018-11-26 NOTE — Progress Notes (Signed)
Cardiology Office Note    Date:  11/28/2018   ID:  Derek Wells, DOB April 29, 1933, MRN [id number]  PCP:  Marden Noble, MD  Cardiologist: Dr. Tresa Endo  Chief Complaint  Patient presents with  . Follow-up    seen for Dr. Tresa Endo.     History of Present Illness:  Derek Wells is a 85 y.o. male with PMH of CAD s/p CABG x 3, atrial flutter/A. fib, AAA repair 12/2008, HTN, and HLD.  He underwent cardiac catheterization in April 2014 revealing 60 to 70% ostial left main lesion, 90 to 95% distal left main lesion, 95 to 99% ostial LAD stenosis, mild to moderate stenosis in the RCA.  He eventually underwent CABG x3 with LIMA to LAD, SVG to OM, and SVG to PDA by Dr. Laneta Simmers.  He was found to have atrial flutter during cardiac rehab by January 2015.  He did not have cardiac awareness at the time.  Xarelto 20 mg was added to his medical regimen.  Myoview obtained in October 2017 showed EF 62%, no ischemia.  He had a recurrence of atrial fibrillation in July 2018 and ultimately underwent successful cardioversion in August 2018.  He has since been seen by atrial fibrillation clinic as well.  He was instructed to reduce EtOH consumption.  He had recurrent atrial fibrillation in August 2019 and was set up for outpatient DC cardioversion.  However prior to cardioversion, he self converted.  Echocardiogram obtained on 06/25/2018 showed EF 60 to 65%, grade 2 DD, mild aortic stenosis, mild MR, PA peak pressure 34 mmHg.  He was seen by [name], PA-C on 08/26/2018, he has went back into slow atrial fibrillation at the time versus possible junctional rhythm.  He has significant fatigue and shortness of breath.  It was suspected he might have chronotropic incompetence, his diltiazem was stopped and switched to Lasix instead.  He did improve symptomatically off of Cardizem.  Since then he has been followed by [facility] atrial fibrillation clinic.  His last office visit with A. fib clinic was on 2/25, he wished to discussed  atrial fibrillation options again at the time.  He was in persistent atrial fibrillation based on Kardia readings.  Patient presents today, he continues to complain of occasional episode of fatigue.  He would do well on some days and to feel very fatigued all the other days.  He thinks this is related to episodes of significant bradycardia.  Based on his [name] report, sometimes his heart rate dropped down to the 40s.  I suspect he would feel better after discontinuing the Toprol-XL 12.5 mg daily.  I will stop his Toprol-XL for the time being and reassess his heart rate in 1 week in the atrial fibrillation clinic.  Hopefully his baseline heart rate stays more so in the 60s to 80s.  I think he will feel better with that heart rate.  If he is feeling better, then I would recommend rate control and anticoagulation while remaining atrial fibrillation.  However if his heart rate remain poorly controlled and that he continued to feel poorly, I would recommend hospitalization with Tikosyn loading as recommended by atrial fibrillation clinic.   Past Medical History:  Diagnosis Date  . CAD (coronary artery disease), severe needing CABG 01/19/2013  . Chest pain    2D ECHO, 06/16/2012 - EF 50-55%, borderline low left ventricular systolic function  . Coronary artery disease   . Dyspnea on exertion    LEXISCAN, 06/17/2012 - fixed inferior  bowel artifact and basal septal defect, probably related to IVCD/incomplete LBBB, no reversible ischemia, post-stress EF 66%  . History of AAA (abdominal aortic aneurysm) repair    ABDOMINAL AORTIC DOPPLER, 06/16/2013 - normal  . HTN (hypertension) 01/19/2013  . Hyperlipidemia LDL goal < 70 01/19/2013  . Hypertension   . S/P CABG x 3 01/19/2013    Past Surgical History:  Procedure Laterality Date  . CARDIAC CATHETERIZATION  01/15/2013   Severe coronary obstructive disease with 60-70% ostial left main and 90-85% distal LAD stenosis and mild-moderate stenoses in RCA, recommended for  CABG  . CARDIOVERSION N/A 11/12/2013   Procedure: CARDIOVERSION;  Surgeon: Lennette Bihari, MD;  Location: [facility] ENDOSCOPY;  Service: Cardiovascular;  Laterality: N/A;  11:16 Lido 60mg , Propofol 120mg ,IV 11:19 synch 100 joules conversion conversion to NSR....  . CARDIOVERSION N/A 05/13/2017   Procedure: CARDIOVERSION;  Surgeon: Elease Hashimoto Deloris Ping, MD;  Location: [facility] ENDOSCOPY;  Service: Cardiovascular;  Laterality: N/A;  . CORONARY ARTERY BYPASS GRAFT N/A 01/15/2013   Procedure: Coronary Artery Bypass Graft times three using Right Greater Saphenous Vein Graft harvested endoscopically and Left Internal Mammary Artery and;  Surgeon: Alleen Borne, MD;  Location: [facility] OR;  Service: Open Heart Surgery;  Laterality: N/A;  . INTRAOPERATIVE TRANSESOPHAGEAL ECHOCARDIOGRAM  01/15/2013   Procedure: INTRAOPERATIVE TRANSESOPHAGEAL ECHOCARDIOGRAM;  Surgeon: Alleen Borne, MD;  Location: [facility] OR;  Service: Open Heart Surgery;;  . LEFT HEART CATHETERIZATION WITH CORONARY ANGIOGRAM N/A 01/15/2013   Procedure: LEFT HEART CATHETERIZATION WITH CORONARY ANGIOGRAM;  Surgeon: Lennette Bihari, MD;  Location: [facility] CATH LAB;  Service: Cardiovascular;  Laterality: N/A;  . VASCULAR SURGERY      Current Medications: Outpatient Medications Prior to Visit  Medication Sig Dispense Refill  . atorvastatin (LIPITOR) 40 MG tablet TAKE 1 TABLET BY MOUTH ONCE DAILY 90 tablet 3  . ELIQUIS 5 MG TABS tablet TAKE 1 TABLET BY MOUTH TWICE DAILY 60 tablet 6  . furosemide (LASIX) 20 MG tablet Take 1 tablet (20 mg total) by mouth daily. 30 tablet 2  . irbesartan (AVAPRO) 150 MG tablet TAKE 1 TABLET(150 MG) BY MOUTH DAILY 90 tablet 2  . metFORMIN (GLUCOPHAGE) 500 MG tablet Take 500 mg by mouth daily with breakfast.     . Multiple Vitamin (MULTIVITAMIN WITH MINERALS) TABS Take 1 tablet by mouth daily.    . metoprolol succinate (TOPROL-XL) 25 MG 24 hr tablet TAKE 1/2 TABLET(12.5 MG) BY MOUTH DAILY 30 tablet 6   No facility-administered medications prior to  visit.      Allergies:   Penicillins   Social History   Socioeconomic History  . Marital status: Married    Spouse name: Not on file  . Number of children: Not on file  . Years of education: Not on file  . Highest education level: Not on file  Occupational History  . Not on file  Social Needs  . Financial resource strain: Not on file  . Food insecurity:    Worry: Not on file    Inability: Not on file  . Transportation needs:    Medical: Not on file    Non-medical: Not on file  Tobacco Use  . Smoking status: Former Smoker    Types: Cigars    Last attempt to quit: 01/16/2013    Years since quitting: 5.8  . Smokeless tobacco: Never Used  Substance and Sexual Activity  . Alcohol use: Yes    Alcohol/week: 14.0 standard drinks    Types: 14 Standard drinks or  equivalent per week  . Drug use: No  . Sexual activity: Not on file  Lifestyle  . Physical activity:    Days per week: Not on file    Minutes per session: Not on file  . Stress: Not on file  Relationships  . Social connections:    Talks on phone: Not on file    Gets together: Not on file    Attends religious service: Not on file    Active member of club or organization: Not on file    Attends meetings of clubs or organizations: Not on file    Relationship status: Not on file  Other Topics Concern  . Not on file  Social History Narrative  . Not on file     Family History:  The patient's family history includes Heart disease in his brother, father, and mother; Hyperlipidemia in his mother; Hypertension in his father.   ROS:   Please see the history of present illness.    ROS All other systems reviewed and are negative.   PHYSICAL EXAM:   VS:  BP (!) 162/62   Pulse 64   Ht 6' 1.5" (1.867 m)   Wt 241 lb (109.3 kg)   SpO2 98%   BMI 31.36 kg/m    GEN: Well nourished, well developed, in no acute distress  HEENT: normal  Neck: no JVD, carotid bruits, or masses Cardiac: Bradycardic; no murmurs, rubs, or  gallops,no edema  Respiratory:  clear to auscultation bilaterally, normal work of breathing GI: soft, nontender, nondistended, + BS MS: no deformity or atrophy  Skin: warm and dry, no rash Neuro:  Alert and Oriented x 3, Strength and sensation are intact Psych: euthymic mood, full affect  Wt Readings from Last 3 Encounters:  11/26/18 241 lb (109.3 kg)  11/17/18 238 lb (108 kg)  10/22/18 241 lb (109.3 kg)      Studies/Labs Reviewed:   EKG:  EKG is not ordered today.    Recent Labs: 05/26/2018: ALT 15; BUN 15; Creatinine, Ser 0.96; Hemoglobin 14.7; Platelets 267; Potassium 4.5; Sodium 140; TSH 2.410   Lipid Panel    Component Value Date/Time   CHOL 143 05/26/2018 1010   CHOL 111 03/01/2015 1033   TRIG 173 (H) 05/26/2018 1010   TRIG 90 03/01/2015 1033   HDL 37 (L) 05/26/2018 1010   HDL 41 03/01/2015 1033   CHOLHDL 3.9 05/26/2018 1010   CHOLHDL 4.1 07/19/2016 0949   VLDL 24 07/19/2016 0949   LDLCALC 71 05/26/2018 1010   LDLCALC 52 03/01/2015 1033    Additional studies/ records that were reviewed today include:   Echo 06/25/2018 LV EF: 60% -   65% Study Conclusions  - Left ventricle: The cavity size was normal. There was mild   concentric hypertrophy. Systolic function was normal. The   estimated ejection fraction was in the range of 60% to 65%. Wall   motion was normal; there were no regional wall motion   abnormalities. Features are consistent with a pseudonormal left   ventricular filling pattern, with concomitant abnormal relaxation   and increased filling pressure (grade 2 diastolic dysfunction).   Doppler parameters are consistent with indeterminate ventricular   filling pressure. - Aortic valve: There was mild stenosis. Peak velocity (S): 268   cm/s. Mean gradient (S): 14 mm Hg. Valve area (VTI): 1.68 cm^2.   Valve area (Vmax): 1.56 cm^2. Valve area (Vmean): 1.56 cm^2. - Mitral valve: Calcified annulus. Transvalvular velocity was   within the normal range.  There was no evidence for stenosis.   There was mild regurgitation. - Left atrium: The atrium was mildly dilated. - Right ventricle: The cavity size was normal. Wall thickness was   normal. Systolic function was normal. - Tricuspid valve: There was trivial regurgitation. - Pulmonary arteries: Systolic pressure was within the normal   range. PA peak pressure: 34 mm Hg (S).    ASSESSMENT:    1. Longstanding persistent atrial fibrillation   2. Coronary artery disease involving coronary bypass graft of native heart without angina pectoris   3. S/P AAA repair   4. Essential hypertension   5. Hyperlipidemia LDL goal <70      PLAN:  In order of problems listed above:  1. Chronic atrial fibrillation: He continued to have intermittent fatigue on the days when his heart rate is slow.  I will stop his metoprolol.  This likely will take about 48 hours to washout.  He is on minimal dose of Toprol-XL, I suspect he is heart rate increased should not be too significant.  He will be reassessed in the atrial fibrillation clinic and consider admission for Tikosyn loading.  Continue Eliquis  2. CAD s/p CABG: Denies any obvious chest pain  3. History of AAA repair: No new issue.  4. Hypertension: Blood pressure elevated today.  However patient continued to feel poorly.  I will stop the metoprolol at this time and potentially consider addition of another blood pressure medication on follow-up if his blood pressure continue to be elevated  5. Hyperlipidemia: On Lipitor     Medication Adjustments/Labs and Tests Ordered: Current medicines are reviewed at length with the patient today.  Concerns regarding medicines are outlined above.  Medication changes, Labs and Tests ordered today are listed in the Patient Instructions below. Patient Instructions  Medication Instructions:  STOP METOPROLOL SUCCINATE 25 Mg   If you need a refill on your cardiac medications before your next appointment, please call  your pharmacy.   Lab work:  If you have labs (blood work) drawn today and your tests are completely normal, you will receive your results only by: Marland Kitchen MyChart Message (if you have MyChart) OR . A paper copy in the mail If you have any lab test that is abnormal or we need to change your treatment, we will call you to review the results.  Testing/Procedures:   Follow-Up: . Your physician recommends that you schedule a follow-up appointment in 1 WEEK with Rudi Coco, NP at the Afib clinic     Any Other Special Instructions Will Be Listed Below (If Applicable).       Ramond Dial, [place]  11/28/2018 11:56 PM    [facility] [address] [place], [place], [place]  [address] Phone: [phone]; Fax: [phone]

## 2018-11-26 NOTE — Patient Instructions (Addendum)
Medication Instructions:  STOP METOPROLOL SUCCINATE 25 Mg   If you need a refill on your cardiac medications before your next appointment, please call your pharmacy.   Lab work:  If you have labs (blood work) drawn today and your tests are completely normal, you will receive your results only by: Marland Kitchen MyChart Message (if you have MyChart) OR . A paper copy in the mail If you have any lab test that is abnormal or we need to change your treatment, we will call you to review the results.  Testing/Procedures:   Follow-Up: . Your physician recommends that you schedule a follow-up appointment in 1 WEEK with Rudi Coco, NP at the Afib clinic     Any Other Special Instructions Will Be Listed Below (If Applicable).

## 2018-11-27 ENCOUNTER — Encounter (HOSPITAL_COMMUNITY)
Admission: RE | Admit: 2018-11-27 | Discharge: 2018-11-27 | Disposition: A | Discharge status: Home / Self Care | Payer: Self-pay | Source: Ambulatory Visit | Attending: Cardiovascular Disease | Admitting: Cardiovascular Disease

## 2018-11-27 DIAGNOSIS — I4819 Other persistent atrial fibrillation: Secondary | ICD-10-CM | POA: Diagnosis not present

## 2018-11-28 ENCOUNTER — Encounter: Payer: Self-pay | Admitting: Physician Assistant

## 2018-11-30 ENCOUNTER — Encounter (HOSPITAL_COMMUNITY)
Admission: RE | Admit: 2018-11-30 | Discharge: 2018-11-30 | Disposition: A | Discharge status: Home / Self Care | Payer: Self-pay | Source: Ambulatory Visit | Attending: Cardiovascular Disease | Admitting: Cardiovascular Disease

## 2018-12-02 ENCOUNTER — Encounter (HOSPITAL_COMMUNITY)
Admission: RE | Admit: 2018-12-02 | Discharge: 2018-12-02 | Disposition: A | Discharge status: Home / Self Care | Payer: Self-pay | Source: Ambulatory Visit | Attending: Cardiovascular Disease | Admitting: Cardiovascular Disease

## 2018-12-02 ENCOUNTER — Other Ambulatory Visit: Payer: Self-pay

## 2018-12-03 ENCOUNTER — Ambulatory Visit (HOSPITAL_COMMUNITY)
Admission: RE | Admit: 2018-12-03 | Discharge: 2018-12-03 | Disposition: A | Discharge status: Home / Self Care | Payer: Medicare Other | Source: Ambulatory Visit | Attending: Nurse Practitioner | Admitting: Nurse Practitioner

## 2018-12-03 ENCOUNTER — Encounter (HOSPITAL_COMMUNITY): Payer: Self-pay | Admitting: Nurse Practitioner

## 2018-12-03 ENCOUNTER — Other Ambulatory Visit: Payer: Self-pay

## 2018-12-03 VITALS — BP 142/58 | HR 53 | Ht 73.5 in | Wt 240.0 lb

## 2018-12-03 DIAGNOSIS — Z87891 Personal history of nicotine dependence: Secondary | ICD-10-CM | POA: Insufficient documentation

## 2018-12-03 DIAGNOSIS — Z7901 Long term (current) use of anticoagulants: Secondary | ICD-10-CM | POA: Diagnosis not present

## 2018-12-03 DIAGNOSIS — Z7984 Long term (current) use of oral hypoglycemic drugs: Secondary | ICD-10-CM | POA: Diagnosis not present

## 2018-12-03 DIAGNOSIS — Z951 Presence of aortocoronary bypass graft: Secondary | ICD-10-CM | POA: Insufficient documentation

## 2018-12-03 DIAGNOSIS — Z8249 Family history of ischemic heart disease and other diseases of the circulatory system: Secondary | ICD-10-CM | POA: Diagnosis not present

## 2018-12-03 DIAGNOSIS — Z79899 Other long term (current) drug therapy: Secondary | ICD-10-CM | POA: Diagnosis not present

## 2018-12-03 DIAGNOSIS — I2510 Atherosclerotic heart disease of native coronary artery without angina pectoris: Secondary | ICD-10-CM | POA: Diagnosis not present

## 2018-12-03 DIAGNOSIS — I4892 Unspecified atrial flutter: Secondary | ICD-10-CM | POA: Diagnosis not present

## 2018-12-03 DIAGNOSIS — I10 Essential (primary) hypertension: Secondary | ICD-10-CM | POA: Diagnosis not present

## 2018-12-03 DIAGNOSIS — I4819 Other persistent atrial fibrillation: Secondary | ICD-10-CM | POA: Diagnosis not present

## 2018-12-03 DIAGNOSIS — E785 Hyperlipidemia, unspecified: Secondary | ICD-10-CM | POA: Insufficient documentation

## 2018-12-03 NOTE — Progress Notes (Signed)
Primary Care Physician: Derek Noble, MD Referring Physician: [facility]   Derek Wells is a 85 y.o. male with a h/o CAD, s/p bypass in 2014,HTN, remote afib that was seen in the afib clinic, 04/15/17, for f/u of afib found in cardiac rehab yesterday. He remains in rate controlled afib today but is asymptomatic. He gives a h/o of a drop in h/h while on anticoagulation for afib in 2016 and anticoagulation was stopped. He had an upper endoscopy and colonoscopy without any source of bleeding. He reports that H/H stabilized with addition of iron. He  has a chadsvasc score of at least 4.  Pt believes that onset of afib may have been due to stress from a family situation and having family members in last week and drinking heavier amounts of alcohol. He feels well in rate controlled afuib and did not want any further intervention. He was started on anticoagulation.  F/u in afib clinic 04/23/17. Still is asymptomatic in afb but has noted heart rate to be running in low 50's/upper 40's at times. He has been back to cardiac rehab and did well exercising. Slightly sluggish when heart rate is low. CBC pending today with h/o prior anemia without bleeding source. No obvious bleeding with start of eliquis.  F/u in afib clinic 8/17 at pt request. He reports that he can not take a good deep breath and he is waking up at night and can not get back to sleep. Fluid status is normal, actually down 2 lbs. He has not noted any bleeding. He remains in afib at 62 bpm. Stat CBC with normal blood count. He feels that the afib may be getting to him and is now willing to have a cardioversion. He had one in the remote past which did keep him in rhythm for some time.  F/u after cardioversion, he had successful DCCV and is holding in SR. He feels better with breathing easier and sleeping better at night. Wife states that he still drinks more alcohol than he should. He can return to cardiac rehab. He has no chest tightness with  exercising at Cardiac rehab but does have some occasional chest tightness with walking with his wife. He says that this is a chronic condition  and that he has discussed with  Dr. Tresa Wells of these symptoms in the past. Last stress test in 10/17 and was low risk.  F/u in afib clinic 05/13/18. He was going up the steps to bed last pm when he was aware of a full feeling in his throat and upper chest with some radiation to upper Left shoulder. He was also aware of some palpitations. He got ready for better and after getting in bed the symptoms improved.  First episode since last year with palpitations. He felt he was out of rhythm and asked to be seen today. Ekg  confirms that he is in a rate controlled atrial flutter. He is still drinking too much alcohol usually at least 2 drinks a night. He played golf yesterday without any symptoms. He later self converted and did not require cardioversion.  F/u in afib clinic, 12/9. He reports that his amlodipine was switched to Cardizem in November with a check up with Dr. Tresa Wells. Shortly after that he developed shortness of breath to the point that it was very hard for him to be exertional. He went back to see Derek Wells and chronotropic incompetence was suspected with mild volume overload and the drug was stopped, lasix was  added.  He was in a slow afib then and this appointment was made. The pt states that he feels much improved off Cardizem. He doesn't even feel the afib currently. He is rate controlled.He just came off a prednisone taper yesterday for sinus infection.  F/u afib clinic 1/30/20220. I am seeing Derek Wells as Derek Wells was documented at cardiac rehab. He is in afib today, rate controlled. Some days he is fatigued, other days he feels great. He did get a Derek Wells but has not thought to try to correlate his strips to how he is feeling. We discussed antiarrythmic's but he Is not ready.   F/u in afib clinic 2/25. He wanted to discuss afib options again. I believe by  his Kardia readings that he has been persistently in afib since I saw him last. He is concerned re some HR readings on his Derek Wells showed HR around 48 bpm. He did not feel well Sunday and thought it might be from afib but I reviewed strips and he did not have any excessively slow afib or rapid afib. He wants to come off BB but with h/o heart disease, I would want to have Dr. Tresa Wells address that.  I feel his best antiarrythmic choice would   be tikosyn vrs amiodarone for the slowing effect of amiodarone.   F/u afib clinic, 3/12. He stopped BB after last visit here, okayed by cardiology.He feels as  though he has more energy. He currently feels minimally symptomatic in afib but is still entertaining tikosyn but wants to wait until the corona virus calms down.  Today, he denies symptoms of palpitations, chest pain, shortness of breath, orthopnea, PND, lower extremity edema, dizziness, presyncope, syncope, or neurologic sequela. The patient is tolerating medications without difficulties and is otherwise without complaint today.   Past Medical History:  Diagnosis Date  . CAD (coronary artery disease), severe needing CABG 01/19/2013  . Chest pain    2D ECHO, 06/16/2012 - EF 50-55%, borderline low left ventricular systolic function  . Coronary artery disease   . Dyspnea on exertion    LEXISCAN, 06/17/2012 - fixed inferior bowel artifact and basal septal defect, probably related to IVCD/incomplete LBBB, no reversible ischemia, post-stress EF 66%  . History of AAA (abdominal aortic aneurysm) repair    ABDOMINAL AORTIC DOPPLER, 06/16/2013 - normal  . HTN (hypertension) 01/19/2013  . Hyperlipidemia LDL goal < 70 01/19/2013  . Hypertension   . S/P CABG x 3 01/19/2013   Past Surgical History:  Procedure Laterality Date  . CARDIAC CATHETERIZATION  01/15/2013   Severe coronary obstructive disease with 60-70% ostial left main and 90-85% distal LAD stenosis and mild-moderate stenoses in RCA, recommended for CABG  .  CARDIOVERSION N/A 11/12/2013   Procedure: CARDIOVERSION;  Surgeon: Lennette Bihari, MD;  Location: [facility] ENDOSCOPY;  Service: Cardiovascular;  Laterality: N/A;  11:16 Lido 60mg , Propofol 120mg ,IV 11:19 synch 100 joules conversion conversion to NSR....  . CARDIOVERSION N/A 05/13/2017   Procedure: CARDIOVERSION;  Surgeon: Elease Hashimoto Deloris Ping, MD;  Location: [facility] ENDOSCOPY;  Service: Cardiovascular;  Laterality: N/A;  . CORONARY ARTERY BYPASS GRAFT N/A 01/15/2013   Procedure: Coronary Artery Bypass Graft times three using Right Greater Saphenous Vein Graft harvested endoscopically and Left Internal Mammary Artery and;  Surgeon: Alleen Borne, MD;  Location: [facility] OR;  Service: Open Heart Surgery;  Laterality: N/A;  . INTRAOPERATIVE TRANSESOPHAGEAL ECHOCARDIOGRAM  01/15/2013   Procedure: INTRAOPERATIVE TRANSESOPHAGEAL ECHOCARDIOGRAM;  Surgeon: Alleen Borne, MD;  Location: [facility] OR;  Service: Open  Heart Surgery;;  . LEFT HEART CATHETERIZATION WITH CORONARY ANGIOGRAM N/A 01/15/2013   Procedure: LEFT HEART CATHETERIZATION WITH CORONARY ANGIOGRAM;  Surgeon: Lennette Bihari, MD;  Location: [facility] CATH LAB;  Service: Cardiovascular;  Laterality: N/A;  . VASCULAR SURGERY      Current Outpatient Medications  Medication Sig Dispense Refill  . atorvastatin (LIPITOR) 40 MG tablet TAKE 1 TABLET BY MOUTH ONCE DAILY 90 tablet 3  . ELIQUIS 5 MG TABS tablet TAKE 1 TABLET BY MOUTH TWICE DAILY 60 tablet 6  . furosemide (LASIX) 20 MG tablet Take 1 tablet (20 mg total) by mouth daily. 30 tablet 2  . irbesartan (AVAPRO) 150 MG tablet TAKE 1 TABLET(150 MG) BY MOUTH DAILY 90 tablet 2  . metFORMIN (GLUCOPHAGE) 500 MG tablet Take 500 mg by mouth daily with breakfast.     . Multiple Vitamin (MULTIVITAMIN WITH MINERALS) TABS Take 1 tablet by mouth daily.     No current facility-administered medications for this encounter.     Allergies  Allergen Reactions  . Penicillins Rash    Has patient had a PCN reaction causing immediate rash,  facial/tongue/throat swelling, SOB or lightheadedness with hypotension: no Has patient had a PCN reaction causing severe rash involving mucus membranes or skin necrosis: no Has patient had a PCN reaction that required hospitalization: no Has patient had a PCN reaction occurring within the last 10 years: yes If all of the above answers are "NO", then may proceed with Cephalosporin use.     Social History   Socioeconomic History  . Marital status: Married    Spouse name: Not on file  . Number of children: Not on file  . Years of education: Not on file  . Highest education level: Not on file  Occupational History  . Not on file  Social Needs  . Financial resource strain: Not on file  . Food insecurity:    Worry: Not on file    Inability: Not on file  . Transportation needs:    Medical: Not on file    Non-medical: Not on file  Tobacco Use  . Smoking status: Former Smoker    Types: Cigars    Last attempt to quit: 01/16/2013    Years since quitting: 5.8  . Smokeless tobacco: Never Used  Substance and Sexual Activity  . Alcohol use: Yes    Alcohol/week: 14.0 standard drinks    Types: 14 Standard drinks or equivalent per week  . Drug use: No  . Sexual activity: Not on file  Lifestyle  . Physical activity:    Days per week: Not on file    Minutes per session: Not on file  . Stress: Not on file  Relationships  . Social connections:    Talks on phone: Not on file    Gets together: Not on file    Attends religious service: Not on file    Active member of club or organization: Not on file    Attends meetings of clubs or organizations: Not on file    Relationship status: Not on file  . Intimate partner violence:    Fear of current or ex partner: Not on file    Emotionally abused: Not on file    Physically abused: Not on file    Forced sexual activity: Not on file  Other Topics Concern  . Not on file  Social History Narrative  . Not on file    Family History  Problem  Relation Age of Onset  . Hyperlipidemia  Mother   . Heart disease Mother   . Heart disease Father   . Hypertension Father   . Heart disease Brother     ROS- All systems are reviewed and negative except as per the HPI above  Physical Exam: Vitals:   12/03/18 1524  BP: (!) 142/58  Pulse: (!) 53  Weight: 108.9 kg  Height: 6' 1.5" (1.867 m)   Wt Readings from Last 3 Encounters:  12/03/18 108.9 kg  11/26/18 109.3 kg  11/17/18 108 kg    Labs: Lab Results  Component Value Date   NA 140 05/26/2018   K 4.5 05/26/2018   CL 105 05/26/2018   CO2 23 05/26/2018   GLUCOSE 103 (H) 05/26/2018   BUN 15 05/26/2018   CREATININE 0.96 05/26/2018   CALCIUM 9.6 05/26/2018   MG 2.0 10/12/2013   Lab Results  Component Value Date   INR 1.89 (H) 11/08/2013   Lab Results  Component Value Date   CHOL 143 05/26/2018   HDL 37 (L) 05/26/2018   LDLCALC 71 05/26/2018   TRIG 173 (H) 05/26/2018   CBC WNL  GEN- The patient is well appearing, alert and oriented x 3 today.   Head- normocephalic, atraumatic Eyes-  Sclera clear, conjunctiva pink Ears- hearing intact Oropharynx- clear Neck- supple, no JVP Lymph- no cervical lymphadenopathy Lungs- Clear to ausculation bilaterally, normal work of breathing Heart-irregular rate and rhythm, no murmurs, rubs or gallops, PMI not laterally displaced GI- soft, NT, ND, + BS Extremities- no clubbing, cyanosis, or edema MS- no significant deformity or atrophy Skin- no rash or lesion Psych- euthymic mood, full affect Neuro- strength and sensation are intact  EKG- afib at 53 bpm, qrs int 126 ms, qtc 431 ms Epic records reviewed One week Zio patch reviewed, showed persistent afib with v rates between 33 bpm, ( at 1:20 am with  sleep) to 97 bpm with a average HR of 53 bpm(before stopping BB)   Assessment and Plan: 1. Minimally persistent atrial fib/flutter I feel that he has been persistent for several weeks to months now He feels better with stopping  BB results of monitor reviewed with pt He is more interested in pursing SR but would like to try tikosyn when the corona virus situation calms down Cardizem in the past may have contributed to chronotropic  intolerance and shortness of breath, much improved off CCB He does have a chadsvasc score of 4 Continue eliquis 5 mg bid  Discussed antiarrythmic therapy, not 1C's for BBB, bradycardia, first degree block at baseline, primarily discussed amiodarone or tikosyn as his options Amiodarone may contribute to brady   He will call me when he is ready for tikosyn admission   Lupita LeashDonna C. Matthew Folksarroll, ANP-C Afib Clinic [facility] [address] [place], [place] [id number] [phone]

## 2018-12-04 ENCOUNTER — Other Ambulatory Visit: Payer: Self-pay

## 2018-12-04 ENCOUNTER — Encounter (HOSPITAL_COMMUNITY)
Admission: RE | Admit: 2018-12-04 | Discharge: 2018-12-04 | Disposition: A | Discharge status: Home / Self Care | Payer: Self-pay | Source: Ambulatory Visit | Attending: Cardiovascular Disease | Admitting: Cardiovascular Disease

## 2018-12-07 ENCOUNTER — Telehealth (HOSPITAL_COMMUNITY): Payer: Self-pay

## 2018-12-07 ENCOUNTER — Encounter (HOSPITAL_COMMUNITY): Payer: Self-pay

## 2018-12-07 NOTE — Telephone Encounter (Signed)
Phone call to patient communicated that cardiac rehab will be closing for 2 weeks d/t the corona virus. Unable to reach patient, left voicemail.  

## 2018-12-09 ENCOUNTER — Encounter (HOSPITAL_COMMUNITY): Payer: Self-pay

## 2018-12-11 ENCOUNTER — Encounter (HOSPITAL_COMMUNITY): Payer: Self-pay

## 2018-12-14 ENCOUNTER — Encounter (HOSPITAL_COMMUNITY): Payer: Self-pay

## 2018-12-15 ENCOUNTER — Ambulatory Visit: Payer: Medicare Other | Admitting: Cardiovascular Disease

## 2018-12-15 ENCOUNTER — Telehealth (HOSPITAL_COMMUNITY): Payer: Self-pay | Admitting: Internal Medicine

## 2018-12-16 ENCOUNTER — Encounter (HOSPITAL_COMMUNITY): Payer: Self-pay

## 2018-12-18 ENCOUNTER — Encounter (HOSPITAL_COMMUNITY): Payer: Self-pay

## 2018-12-21 ENCOUNTER — Encounter (HOSPITAL_COMMUNITY): Payer: Self-pay

## 2018-12-23 ENCOUNTER — Encounter (HOSPITAL_COMMUNITY): Payer: Self-pay

## 2018-12-25 ENCOUNTER — Encounter (HOSPITAL_COMMUNITY): Payer: Self-pay

## 2018-12-28 ENCOUNTER — Other Ambulatory Visit: Payer: Self-pay | Admitting: Cardiology

## 2018-12-28 ENCOUNTER — Other Ambulatory Visit: Payer: Self-pay | Admitting: Cardiovascular Disease

## 2018-12-28 ENCOUNTER — Encounter (HOSPITAL_COMMUNITY): Payer: Self-pay

## 2018-12-30 ENCOUNTER — Encounter (HOSPITAL_COMMUNITY): Payer: Self-pay

## 2019-01-01 ENCOUNTER — Encounter (HOSPITAL_COMMUNITY): Payer: Self-pay

## 2019-01-01 ENCOUNTER — Telehealth (HOSPITAL_COMMUNITY): Payer: Self-pay | Admitting: *Deleted

## 2019-01-01 NOTE — Telephone Encounter (Signed)
Called to notify patient that the cardiac and pulmonary rehabilitation department remains closed at this time due to COVID-19 restrictions. Pt verbalized understanding.  Artist Pais, MS, ACSM CEP 01/01/2019 [phone]

## 2019-01-04 ENCOUNTER — Encounter (HOSPITAL_COMMUNITY): Payer: Self-pay

## 2019-01-06 ENCOUNTER — Encounter (HOSPITAL_COMMUNITY): Payer: Self-pay

## 2019-01-08 ENCOUNTER — Encounter (HOSPITAL_COMMUNITY): Payer: Self-pay

## 2019-01-11 ENCOUNTER — Encounter (HOSPITAL_COMMUNITY): Payer: Self-pay

## 2019-01-13 ENCOUNTER — Encounter (HOSPITAL_COMMUNITY): Payer: Self-pay

## 2019-01-14 ENCOUNTER — Ambulatory Visit: Payer: Medicare Other | Admitting: Cardiovascular Disease

## 2019-01-15 ENCOUNTER — Encounter (HOSPITAL_COMMUNITY): Payer: Self-pay

## 2019-01-18 ENCOUNTER — Encounter (HOSPITAL_COMMUNITY): Payer: Self-pay

## 2019-01-19 ENCOUNTER — Telehealth: Payer: Medicare Other | Admitting: Cardiovascular Disease

## 2019-01-20 ENCOUNTER — Encounter (HOSPITAL_COMMUNITY): Payer: Self-pay

## 2019-01-22 ENCOUNTER — Encounter (HOSPITAL_COMMUNITY): Payer: Self-pay

## 2019-01-25 ENCOUNTER — Encounter (HOSPITAL_COMMUNITY): Payer: Self-pay

## 2019-01-27 ENCOUNTER — Encounter (HOSPITAL_COMMUNITY): Payer: Self-pay

## 2019-01-28 ENCOUNTER — Telehealth: Payer: Medicare Other | Admitting: Cardiovascular Disease

## 2019-01-29 ENCOUNTER — Encounter (HOSPITAL_COMMUNITY): Payer: Self-pay

## 2019-02-01 ENCOUNTER — Encounter (HOSPITAL_COMMUNITY): Payer: Self-pay

## 2019-02-03 ENCOUNTER — Encounter (HOSPITAL_COMMUNITY): Payer: Self-pay

## 2019-02-05 ENCOUNTER — Encounter (HOSPITAL_COMMUNITY): Payer: Self-pay

## 2019-02-08 ENCOUNTER — Encounter (HOSPITAL_COMMUNITY): Payer: Self-pay

## 2019-02-10 ENCOUNTER — Encounter (HOSPITAL_COMMUNITY): Payer: Self-pay

## 2019-02-12 ENCOUNTER — Encounter (HOSPITAL_COMMUNITY): Payer: Self-pay

## 2019-02-17 ENCOUNTER — Encounter (HOSPITAL_COMMUNITY): Payer: Self-pay

## 2019-02-19 ENCOUNTER — Encounter (HOSPITAL_COMMUNITY): Payer: Self-pay

## 2019-02-22 ENCOUNTER — Encounter (HOSPITAL_COMMUNITY): Payer: Self-pay

## 2019-02-24 ENCOUNTER — Encounter (HOSPITAL_COMMUNITY): Payer: Self-pay

## 2019-02-26 ENCOUNTER — Encounter (HOSPITAL_COMMUNITY): Payer: Self-pay

## 2019-03-01 ENCOUNTER — Encounter (HOSPITAL_COMMUNITY): Payer: Self-pay

## 2019-03-03 ENCOUNTER — Encounter (HOSPITAL_COMMUNITY): Payer: Self-pay

## 2019-03-05 ENCOUNTER — Encounter (HOSPITAL_COMMUNITY): Payer: Self-pay

## 2019-03-08 ENCOUNTER — Encounter (HOSPITAL_COMMUNITY): Payer: Self-pay

## 2019-03-10 ENCOUNTER — Encounter (HOSPITAL_COMMUNITY): Payer: Self-pay

## 2019-03-12 ENCOUNTER — Encounter (HOSPITAL_COMMUNITY): Payer: Self-pay

## 2019-03-15 ENCOUNTER — Encounter (HOSPITAL_COMMUNITY): Payer: Self-pay

## 2019-03-17 ENCOUNTER — Encounter (HOSPITAL_COMMUNITY): Payer: Self-pay

## 2019-03-19 ENCOUNTER — Encounter (HOSPITAL_COMMUNITY): Payer: Self-pay

## 2019-03-22 ENCOUNTER — Encounter (HOSPITAL_COMMUNITY): Payer: Self-pay

## 2019-03-23 ENCOUNTER — Telehealth (HOSPITAL_COMMUNITY): Payer: Self-pay

## 2019-03-24 ENCOUNTER — Encounter (HOSPITAL_COMMUNITY): Payer: Self-pay

## 2019-03-29 ENCOUNTER — Encounter (HOSPITAL_COMMUNITY): Payer: Self-pay

## 2019-03-31 ENCOUNTER — Encounter (HOSPITAL_COMMUNITY): Payer: Self-pay

## 2019-04-02 ENCOUNTER — Encounter (HOSPITAL_COMMUNITY): Payer: Self-pay

## 2019-04-05 ENCOUNTER — Encounter (HOSPITAL_COMMUNITY): Payer: Self-pay

## 2019-04-07 ENCOUNTER — Encounter (HOSPITAL_COMMUNITY): Payer: Self-pay

## 2019-04-09 ENCOUNTER — Encounter (HOSPITAL_COMMUNITY): Payer: Self-pay

## 2019-04-12 ENCOUNTER — Encounter (HOSPITAL_COMMUNITY): Payer: Self-pay

## 2019-04-14 ENCOUNTER — Encounter (HOSPITAL_COMMUNITY): Payer: Self-pay

## 2019-04-16 ENCOUNTER — Encounter (HOSPITAL_COMMUNITY): Payer: Self-pay

## 2019-04-19 ENCOUNTER — Encounter (HOSPITAL_COMMUNITY): Payer: Self-pay

## 2019-04-21 ENCOUNTER — Encounter (HOSPITAL_COMMUNITY): Payer: Self-pay

## 2019-04-23 ENCOUNTER — Encounter (HOSPITAL_COMMUNITY): Payer: Self-pay

## 2019-04-26 ENCOUNTER — Encounter (HOSPITAL_COMMUNITY): Payer: Self-pay

## 2019-04-27 ENCOUNTER — Telehealth: Payer: Self-pay | Admitting: Physician Assistant

## 2019-04-27 NOTE — Telephone Encounter (Signed)
Routed to [place] and [place] CMA

## 2019-04-27 NOTE — Telephone Encounter (Signed)
New Message:    Pt has an appt on 05-13-19 with [facility].. Pt would like to have lab work before his appt if possible please. Please call and let him know if this can be arranged,

## 2019-04-28 ENCOUNTER — Encounter (HOSPITAL_COMMUNITY): Payer: Self-pay

## 2019-04-29 ENCOUNTER — Other Ambulatory Visit: Payer: Self-pay

## 2019-04-29 DIAGNOSIS — I4892 Unspecified atrial flutter: Secondary | ICD-10-CM

## 2019-04-29 DIAGNOSIS — I2510 Atherosclerotic heart disease of native coronary artery without angina pectoris: Secondary | ICD-10-CM

## 2019-04-29 DIAGNOSIS — E785 Hyperlipidemia, unspecified: Secondary | ICD-10-CM

## 2019-04-29 DIAGNOSIS — D509 Iron deficiency anemia, unspecified: Secondary | ICD-10-CM

## 2019-04-29 DIAGNOSIS — I10 Essential (primary) hypertension: Secondary | ICD-10-CM

## 2019-04-29 DIAGNOSIS — Z79899 Other long term (current) drug therapy: Secondary | ICD-10-CM

## 2019-04-29 NOTE — Telephone Encounter (Signed)
Called the patient and informed him that labs such as CBC, CMP, FLP and TSH have been ordered and that he can into the office anytime before his scheduled appointment to have the labs drawn. Patient verbalized understanding and all (if any) questions were answered.

## 2019-04-29 NOTE — Progress Notes (Signed)
Per Almyra Deforest, PA-C   Orders placed for CBC, CMET, Lipid Panel, TSH

## 2019-04-29 NOTE — Telephone Encounter (Signed)
Agree with above labs. CMP, CBC, TSH and FLP.

## 2019-04-30 ENCOUNTER — Encounter (HOSPITAL_COMMUNITY): Payer: Self-pay

## 2019-05-03 ENCOUNTER — Encounter (HOSPITAL_COMMUNITY): Payer: Self-pay

## 2019-05-05 ENCOUNTER — Encounter (HOSPITAL_COMMUNITY): Payer: Self-pay

## 2019-05-07 ENCOUNTER — Encounter (HOSPITAL_COMMUNITY): Payer: Self-pay

## 2019-05-10 ENCOUNTER — Other Ambulatory Visit: Payer: Self-pay

## 2019-05-10 ENCOUNTER — Encounter (HOSPITAL_COMMUNITY): Payer: Self-pay

## 2019-05-10 DIAGNOSIS — I2510 Atherosclerotic heart disease of native coronary artery without angina pectoris: Secondary | ICD-10-CM | POA: Diagnosis not present

## 2019-05-10 DIAGNOSIS — I10 Essential (primary) hypertension: Secondary | ICD-10-CM

## 2019-05-10 DIAGNOSIS — E785 Hyperlipidemia, unspecified: Secondary | ICD-10-CM

## 2019-05-10 DIAGNOSIS — Z79899 Other long term (current) drug therapy: Secondary | ICD-10-CM | POA: Diagnosis not present

## 2019-05-10 DIAGNOSIS — D509 Iron deficiency anemia, unspecified: Secondary | ICD-10-CM

## 2019-05-10 DIAGNOSIS — I4892 Unspecified atrial flutter: Secondary | ICD-10-CM

## 2019-05-10 LAB — CBC
Hematocrit: 38 % (ref 37.5–51.0)
Hemoglobin: 12.1 g/dL — ABNORMAL LOW (ref 13.0–17.7)
MCH: 26.5 pg — ABNORMAL LOW (ref 26.6–33.0)
MCHC: 31.8 g/dL (ref 31.5–35.7)
MCV: 83 fL (ref 79–97)
Platelets: 258 10*3/uL (ref 150–450)
RBC: 4.56 x10E6/uL (ref 4.14–5.80)
RDW: 14.5 % (ref 11.6–15.4)
WBC: 8 10*3/uL (ref 3.4–10.8)

## 2019-05-10 LAB — COMPREHENSIVE METABOLIC PANEL
ALT: 13 IU/L (ref 0–44)
AST: 16 IU/L (ref 0–40)
Albumin/Globulin Ratio: 2 (ref 1.2–2.2)
Albumin: 4.4 g/dL (ref 3.6–4.6)
Alkaline Phosphatase: 69 IU/L (ref 39–117)
BUN/Creatinine Ratio: 16 (ref 10–24)
BUN: 15 mg/dL (ref 8–27)
Bilirubin Total: 0.8 mg/dL (ref 0.0–1.2)
CO2: 22 mmol/L (ref 20–29)
Calcium: 8.9 mg/dL (ref 8.6–10.2)
Chloride: 104 mmol/L (ref 96–106)
Creatinine, Ser: 0.96 mg/dL (ref 0.76–1.27)
GFR calc Af Amer: 84 mL/min/{1.73_m2} (ref >59)
GFR calc non Af Amer: 72 mL/min/{1.73_m2} (ref >59)
Globulin, Total: 2.2 g/dL (ref 1.5–4.5)
Glucose: 107 mg/dL — ABNORMAL HIGH (ref 65–99)
Potassium: 4.8 mmol/L (ref 3.5–5.2)
Sodium: 141 mmol/L (ref 134–144)
Total Protein: 6.6 g/dL (ref 6.0–8.5)

## 2019-05-10 LAB — LIPID PANEL
Chol/HDL Ratio: 3.3 ratio (ref 0.0–5.0)
Cholesterol, Total: 131 mg/dL (ref 100–199)
HDL: 40 mg/dL (ref >39)
LDL Calculated: 71 mg/dL (ref 0–99)
Triglycerides: 101 mg/dL (ref 0–149)
VLDL Cholesterol Cal: 20 mg/dL (ref 5–40)

## 2019-05-10 LAB — TSH: TSH: 2.61 u[IU]/mL (ref 0.450–4.500)

## 2019-05-11 ENCOUNTER — Ambulatory Visit: Payer: Medicare Other | Admitting: Physician Assistant

## 2019-05-11 NOTE — Progress Notes (Signed)
Cholesterol very well controlled. Thyroid ok. Kidney function and electrolyte ok. Hemoglobin slightly down, borderline anemic, but not significant. As long as no obvious blood in the stool or urine, I would recommend continue monitor.

## 2019-05-12 ENCOUNTER — Encounter (HOSPITAL_COMMUNITY): Payer: Self-pay

## 2019-05-13 ENCOUNTER — Encounter: Payer: Self-pay | Admitting: Physician Assistant

## 2019-05-13 ENCOUNTER — Ambulatory Visit (INDEPENDENT_AMBULATORY_CARE_PROVIDER_SITE_OTHER): Payer: Medicare Other | Admitting: General Practice

## 2019-05-13 ENCOUNTER — Other Ambulatory Visit: Payer: Self-pay

## 2019-05-13 VITALS — BP 136/72 | HR 55 | Ht 73.5 in | Wt 238.4 lb

## 2019-05-13 DIAGNOSIS — I10 Essential (primary) hypertension: Secondary | ICD-10-CM | POA: Diagnosis not present

## 2019-05-13 DIAGNOSIS — I25810 Atherosclerosis of coronary artery bypass graft(s) without angina pectoris: Secondary | ICD-10-CM | POA: Diagnosis not present

## 2019-05-13 DIAGNOSIS — Z9889 Other specified postprocedural states: Secondary | ICD-10-CM

## 2019-05-13 DIAGNOSIS — I4811 Longstanding persistent atrial fibrillation: Secondary | ICD-10-CM | POA: Diagnosis not present

## 2019-05-13 DIAGNOSIS — E785 Hyperlipidemia, unspecified: Secondary | ICD-10-CM

## 2019-05-13 DIAGNOSIS — Z8679 Personal history of other diseases of the circulatory system: Secondary | ICD-10-CM

## 2019-05-13 NOTE — Progress Notes (Signed)
Cardiology Clinic Note   Patient Name: Derek Wells Date of Encounter: 05/13/2019  Primary Care Provider:  Josetta Huddle, MD Primary Cardiologist:  Derek Majestic, MD  Patient Profile    branch pacitti 85 year old male presents today for follow-up of his atrial fibrillation  Past Medical History    Past Medical History:  Diagnosis Date   CAD (coronary artery disease), severe needing CABG 01/19/2013   Chest pain    2D ECHO, 06/16/2012 - EF 50-55%, borderline low left ventricular systolic function   Coronary artery disease    Dyspnea on exertion    LEXISCAN, 06/17/2012 - fixed inferior bowel artifact and basal septal defect, probably related to IVCD/incomplete LBBB, no reversible ischemia, post-stress EF 66%   History of AAA (abdominal aortic aneurysm) repair    ABDOMINAL AORTIC DOPPLER, 06/16/2013 - normal   HTN (hypertension) 01/19/2013   Hyperlipidemia LDL goal < 70 01/19/2013   Hypertension    S/P CABG x 3 01/19/2013   Past Surgical History:  Procedure Laterality Date   CARDIAC CATHETERIZATION  01/15/2013   Severe coronary obstructive disease with 60-70% ostial left main and 90-85% distal LAD stenosis and mild-moderate stenoses in RCA, recommended for CABG   CARDIOVERSION N/A 11/12/2013   Procedure: CARDIOVERSION;  Surgeon: Derek Sine, MD;  Location: [facility];  Service: Cardiovascular;  Laterality: N/A;  11:16 Lido 60mg , Propofol 120mg ,IV 11:19 synch 100 joules conversion conversion to NSR....   CARDIOVERSION N/A 05/13/2017   Procedure: CARDIOVERSION;  Surgeon: Derek Fredrickson Wonda Cheng, MD;  Location: [facility] ENDOSCOPY;  Service: Cardiovascular;  Laterality: N/A;   CORONARY ARTERY BYPASS GRAFT N/A 01/15/2013   Procedure: Coronary Artery Bypass Graft times three using Right Greater Saphenous Vein Graft harvested endoscopically and Left Internal Mammary Artery and;  Surgeon: Derek Pollack, MD;  Location: [facility];  Service: Open Heart Surgery;  Laterality: N/A;   INTRAOPERATIVE  TRANSESOPHAGEAL ECHOCARDIOGRAM  01/15/2013   Procedure: INTRAOPERATIVE TRANSESOPHAGEAL ECHOCARDIOGRAM;  Surgeon: Derek Pollack, MD;  Location: [facility];  Service: Open Heart Surgery;;   LEFT HEART CATHETERIZATION WITH CORONARY ANGIOGRAM N/A 01/15/2013   Procedure: LEFT HEART CATHETERIZATION WITH CORONARY ANGIOGRAM;  Surgeon: Derek Sine, MD;  Location: [facility];  Service: Cardiovascular;  Laterality: N/A;   VASCULAR SURGERY      Allergies  Allergies  Allergen Reactions   Penicillins Rash    Has patient had a PCN reaction causing immediate rash, facial/tongue/throat swelling, SOB or lightheadedness with hypotension: no Has patient had a PCN reaction causing severe rash involving mucus membranes or skin necrosis: no Has patient had a PCN reaction that required hospitalization: no Has patient had a PCN reaction occurring within the last 10 years: yes If all of the above answers are "NO", then may proceed with Cephalosporin use.     History of Present Illness    Derek Wells was last seen by Derek Wells on 12/03/2018.  A week prior she had seen Derek Wells and was complaining of fatigue.  His diltiazem had previously been stopped and his metoprolol XL was stopped as well.  With a higher heart rate in the 50s he felt much better and noted more energy.  He continued to be in atrial fibrillation and was considering Tikosyn therapy however, also felt like he wanted to give the COVID-19 virus more time to pass.  He has a past medical history of coronary artery disease status post CABG x3 with LIMA to LAD, SVG to OM and SVG to PDA  by Dr. Laneta Wells (01/19/13).  His history also includes atrial flutter/A. fib, AAA repair 12/2008, hypertension and hyperlipidemia.   While he was doing cardiac rehab in January 2015 he was found to be in atrial flutter however, he did not have any cardiac awareness.  He was started on Xarelto 20 mg.  A Myoview stress test on October 2017 showed LVEF of 62% and no  ischemia.  He had a reoccurrence of atrial fibrillation and July 2018 and underwent a successful DCCV in August 2018.  At that time he was instructed to reduce his EtOH consumption.  He had recurrent atrial fibrillation in August 2019 was scheduled for DCCV however, he spontaneously converted.  An echocardiogram on October 2019 showed an EF of 60 to 65% grade 2 diastolic dysfunction, mild aortic stenosis, mild mitral regurgitation, and peak PA pressures of 34 mmHg.  In December 2019 he was seen and found to be in a slow atrial fibrillation rhythm/possible junctional rhythm and had significant fatigue with shortness of breath.  He was suspected to have chronotropic incompetence and his diltiazem was stopped and switched to Lasix.  He noticed an improvement off diltiazem.  In March 2020 he was seen and indicated that his Lourena SimmondsKardia monitor recorded heart rates in the 40s, he reported again feeling fatigued.  At that time his Toprol-XL 12.5 mg was also stopped.  His heart rate increased into the 50s and he indicated he felt much better.  He presents to the clinic today and states that he feels much better with his heart rate being higher.  He has noticed that he is able to play a full 18 holes of golf whereas before he was only able to play around 12 holes ([place]).  He also states he notices he has more energy around the house and when he is working out in the yard.  He states he is in the process of selling his home however, he has been mindful of the coronavirus and social distancing as people want to come and view the house or when relatives pick up belongings.  He is still interested in trying Tikosyn for his atrial fibrillation but states that at this time he would like to wait until the coronavirus has passed.  He denies chest pain, shortness of breath, lower extremity edema, fatigue, palpitations, melena, hematuria, hemoptysis, diaphoresis, weakness, presyncope, syncope, orthopnea, and PND.   Home  Medications    Prior to Admission medications   Medication Sig Start Date End Date Taking? Authorizing Provider  atorvastatin (LIPITOR) 40 MG tablet TAKE 1 TABLET BY MOUTH ONCE DAILY 12/28/18   Lennette BihariKelly, Thomas A, MD  ELIQUIS 5 MG TABS tablet TAKE 1 TABLET BY MOUTH TWICE DAILY 10/29/18   Newman Niparroll, Donna C, Wells  furosemide (LASIX) 20 MG tablet TAKE 1 TABLET(20 MG) BY MOUTH DAILY 12/28/18   Kilroy, Eda PaschalLuke K, PA-C  irbesartan (AVAPRO) 150 MG tablet TAKE 1 TABLET(150 MG) BY MOUTH DAILY 10/01/18   Lewayne Buntingrenshaw, Brian S, MD  metFORMIN (GLUCOPHAGE) 500 MG tablet Take 500 mg by mouth daily with breakfast.     [provider]  Multiple Vitamin (MULTIVITAMIN WITH MINERALS) TABS Take 1 tablet by mouth daily.    [provider]    Family History    Family History  Problem Relation Age of Onset   Hyperlipidemia Mother    Heart disease Mother    Heart disease Father    Hypertension Father    Heart disease Brother    He indicated that his  mother is deceased. He indicated that his father is deceased. He indicated that his brother is alive.  Social History    Social History   Socioeconomic History   Marital status: Married    Spouse name: Not on file   Number of children: Not on file   Years of education: Not on file   Highest education level: Not on file  Occupational History   Not on file  Social Needs   Financial resource strain: Not on file   Food insecurity    Worry: Not on file    Inability: Not on file   Transportation needs    Medical: Not on file    Non-medical: Not on file  Tobacco Use   Smoking status: Former Smoker    Types: Cigars    Quit date: 01/16/2013    Years since quitting: 6.3   Smokeless tobacco: Never Used  Substance and Sexual Activity   Alcohol use: Yes    Alcohol/week: 14.0 standard drinks    Types: 14 Standard drinks or equivalent per week   Drug use: No   Sexual activity: Not on file  Lifestyle   Physical activity    Days per week:  Not on file    Minutes per session: Not on file   Stress: Not on file  Relationships   Social connections    Talks on phone: Not on file    Gets together: Not on file    Attends religious service: Not on file    Active member of club or organization: Not on file    Attends meetings of clubs or organizations: Not on file    Relationship status: Not on file   Intimate partner violence    Fear of current or ex partner: Not on file    Emotionally abused: Not on file    Physically abused: Not on file    Forced sexual activity: Not on file  Other Topics Concern   Not on file  Social History Narrative   Not on file     Review of Systems    General:  No chills, fever, night sweats or weight changes.  Cardiovascular:  No chest pain, dyspnea on exertion, edema, orthopnea, palpitations, paroxysmal nocturnal dyspnea. Dermatological: No rash, lesions/masses Respiratory: No cough, dyspnea Urologic: No hematuria, dysuria Abdominal:   No nausea, vomiting, diarrhea, bright red blood per rectum, melena, or hematemesis Neurologic:  No visual changes, wkns, changes in mental status. All other systems reviewed and are otherwise negative except as noted above.  Physical Exam    VS:  BP 136/72    Pulse (!) 55    Ht 6' 1.5" (1.867 m)    Wt 238 lb 6.4 oz (108.1 kg)    BMI 31.03 kg/m  , BMI Body mass index is 31.03 kg/m. GEN: Well nourished, well developed, in no acute distress. HEENT: normal. Neck: Supple, no JVD, carotid bruits, or masses. Cardiac: RRR, no murmurs, rubs, or gallops. No clubbing, cyanosis, edema.  Radials/DP/PT 2+ and equal bilaterally.  Respiratory:  Respirations regular and unlabored, clear to auscultation bilaterally. GI: Soft, nontender, nondistended, BS + x 4. MS: no deformity or atrophy. Skin: warm and dry, no rash. Neuro:  Strength and sensation are intact. Psych: Normal affect.  Accessory Clinical Findings    ECG personally reviewed by me today-atrial  fibrillation with slow ventricular response with premature ventricular complexes 55 bpm.- No acute changes  Assessment & Plan   1.  Coronary artery disease- CABG x3 12/2012,  no chest pain today, increased activity tolerance Continue Avapro 150 mg daily Continue atorvastatin 40 mg tablet daily Heart healthy low-sodium diet Maintain physical activity  2.  Longstanding persistent atrial fibrillation-EKG today atrial fibrillation with slow ventricular response with premature ventricular complexes 55 bpm, more tolerant of physical activity with higher heart rate.  Patient still considering Tikosyn, would like to put off at this time due to COVID-19 virus Continue apixaban 5 mg tablet daily Heart healthy low-sodium diet Maintain physical activity  3.  Status post AAA repair-no chest pain at this time  4.  Essential hypertension- well-controlled today 136/72 Continue Avapro 150 mg daily Continue atorvastatin 40 mg tablet daily Heart healthy low-sodium diet Maintain physical activity  5.  Hyperlipidemia 05/10/2019: Cholesterol, Total 131; HDL 40; LDL Calculated 71; Triglycerides 101 Continue atorvastatin 40 mg tablet daily Heart healthy low-sodium diet Maintain physical activity Monitored by PCP  Disposition: Follow-up with Dr. Tresa EndoKelly in 6 months  Ronney AstersJesse M Jeronica Stlouis, Wells 05/13/2019, 4:33 PM

## 2019-05-13 NOTE — Patient Instructions (Signed)
Medication Instructions:   Your physician recommends that you continue on your current medications as directed. Please refer to the Current Medication list given to you today.  If you need a refill on your cardiac medications before your next appointment, please call your pharmacy.   Lab work:  NONE ordered at this time of appointment   If you have labs (blood work) drawn today and your tests are completely normal, you will receive your results only by: . MyChart Message (if you have MyChart) OR . A paper copy in the mail If you have any lab test that is abnormal or we need to change your treatment, we will call you to review the results.  Testing/Procedures:  NONE ordered at this time of appointment   Follow-Up: At [facility], you and your health needs are our priority.  As part of our continuing mission to provide you with exceptional heart care, we have created designated Provider Care Teams.  These Care Teams include your primary Cardiologist (physician) and Advanced Practice Providers (APPs -  Physician Assistants and Nurse Practitioners) who all work together to provide you with the care you need, when you need it. You will need a follow up appointment in 6 months.  Please call our office 2 months in advance to schedule this appointment.  You may see Thomas Kelly, MD or one of the following Advanced Practice Providers on your designated Care Team: Hao Meng, PA-C . Angela Duke, PA-C  Any Other Special Instructions Will Be Listed Below (If Applicable).    

## 2019-05-14 ENCOUNTER — Encounter (HOSPITAL_COMMUNITY): Payer: Self-pay

## 2019-05-17 ENCOUNTER — Encounter (HOSPITAL_COMMUNITY): Payer: Self-pay

## 2019-05-19 ENCOUNTER — Encounter (HOSPITAL_COMMUNITY): Payer: Self-pay

## 2019-05-20 DIAGNOSIS — R05 Cough: Secondary | ICD-10-CM | POA: Diagnosis not present

## 2019-05-21 ENCOUNTER — Encounter (HOSPITAL_COMMUNITY): Payer: Self-pay

## 2019-05-24 ENCOUNTER — Encounter (HOSPITAL_COMMUNITY): Payer: Self-pay

## 2019-05-29 ENCOUNTER — Other Ambulatory Visit (HOSPITAL_COMMUNITY): Payer: Self-pay | Admitting: Nurse Practitioner

## 2019-06-28 ENCOUNTER — Other Ambulatory Visit: Payer: Self-pay

## 2019-06-28 MED ORDER — IRBESARTAN 150 MG PO TABS: ORAL_TABLET | ORAL | 2 refills | Status: DC

## 2019-07-21 DIAGNOSIS — H26493 Other secondary cataract, bilateral: Secondary | ICD-10-CM | POA: Diagnosis not present

## 2019-07-21 DIAGNOSIS — E119 Type 2 diabetes mellitus without complications: Secondary | ICD-10-CM | POA: Diagnosis not present

## 2019-07-21 DIAGNOSIS — H43813 Vitreous degeneration, bilateral: Secondary | ICD-10-CM | POA: Diagnosis not present

## 2019-07-21 DIAGNOSIS — H40013 Open angle with borderline findings, low risk, bilateral: Secondary | ICD-10-CM | POA: Diagnosis not present

## 2019-08-09 DIAGNOSIS — Z1389 Encounter for screening for other disorder: Secondary | ICD-10-CM | POA: Diagnosis not present

## 2019-08-09 DIAGNOSIS — I4892 Unspecified atrial flutter: Secondary | ICD-10-CM | POA: Diagnosis not present

## 2019-08-09 DIAGNOSIS — Z0001 Encounter for general adult medical examination with abnormal findings: Secondary | ICD-10-CM | POA: Diagnosis not present

## 2019-08-09 DIAGNOSIS — E559 Vitamin D deficiency, unspecified: Secondary | ICD-10-CM | POA: Diagnosis not present

## 2019-08-11 DIAGNOSIS — I10 Essential (primary) hypertension: Secondary | ICD-10-CM | POA: Diagnosis not present

## 2019-08-11 DIAGNOSIS — D509 Iron deficiency anemia, unspecified: Secondary | ICD-10-CM | POA: Diagnosis not present

## 2019-08-11 DIAGNOSIS — E785 Hyperlipidemia, unspecified: Secondary | ICD-10-CM | POA: Diagnosis not present

## 2019-08-11 DIAGNOSIS — R972 Elevated prostate specific antigen [PSA]: Secondary | ICD-10-CM | POA: Diagnosis not present

## 2019-10-06 ENCOUNTER — Ambulatory Visit: Payer: Medicare Other | Attending: Internal Medicine

## 2019-10-06 DIAGNOSIS — Z23 Encounter for immunization: Secondary | ICD-10-CM

## 2019-10-06 NOTE — Progress Notes (Signed)
   Covid-19 Vaccination Clinic  Name:  Derek Wells    MRN: [id number] DOB: Feb 08, 1933  10/06/2019  [name] was observed post Covid-19 immunization for 30 minutes based on pre-vaccination screening without incidence. He was provided with Vaccine Information Sheet and instruction to access the V-Safe system.   [name] was instructed to call 911 with any severe reactions post vaccine: Marland Kitchen Difficulty breathing  . Swelling of your face and throat  . A fast heartbeat  . A bad rash all over your body  . Dizziness and weakness    Immunizations Administered    Name Date Dose VIS Date Route   Pfizer COVID-19 Vaccine 10/06/2019  9:50 AM 0.3 mL 09/03/2019 Intramuscular   Manufacturer: [employer], [employer]   Lot: [id number]   NDC: [address]-9

## 2019-10-26 ENCOUNTER — Ambulatory Visit: Payer: Medicare Other | Attending: Internal Medicine

## 2019-10-26 DIAGNOSIS — Z23 Encounter for immunization: Secondary | ICD-10-CM | POA: Insufficient documentation

## 2019-10-26 NOTE — Progress Notes (Signed)
   Covid-19 Vaccination Clinic  Name:  Derek Wells    MRN: [id number] DOB: November 11, 1932  10/26/2019  Mr. Derek Wells was observed post Covid-19 immunization for 15 minutes without incidence. He was provided with Vaccine Information Sheet and instruction to access the V-Safe system.   Mr. Derek Wells was instructed to call 911 with any severe reactions post vaccine: Marland Kitchen Difficulty breathing  . Swelling of your face and throat  . A fast heartbeat  . A bad rash all over your body  . Dizziness and weakness    Immunizations Administered    Name Date Dose VIS Date Route   Pfizer COVID-19 Vaccine 10/26/2019  9:49 AM 0.3 mL 09/03/2019 Intramuscular   Manufacturer: [employer], [employer]   Lot: [id number]   NDC: [address]-4

## 2019-12-21 ENCOUNTER — Other Ambulatory Visit: Payer: Self-pay | Admitting: Cardiovascular Disease

## 2019-12-21 ENCOUNTER — Other Ambulatory Visit (HOSPITAL_COMMUNITY): Payer: Self-pay | Admitting: Nurse Practitioner

## 2019-12-21 ENCOUNTER — Other Ambulatory Visit: Payer: Self-pay | Admitting: Cardiology

## 2020-01-07 ENCOUNTER — Other Ambulatory Visit: Payer: Self-pay

## 2020-01-07 ENCOUNTER — Ambulatory Visit: Payer: Medicare Other | Admitting: Cardiovascular Disease

## 2020-01-07 ENCOUNTER — Encounter: Payer: Self-pay | Admitting: Cardiovascular Disease

## 2020-01-07 VITALS — BP 160/58 | HR 53 | Ht 73.5 in | Wt 238.2 lb

## 2020-01-07 DIAGNOSIS — Z8679 Personal history of other diseases of the circulatory system: Secondary | ICD-10-CM | POA: Diagnosis not present

## 2020-01-07 DIAGNOSIS — I350 Nonrheumatic aortic (valve) stenosis: Secondary | ICD-10-CM

## 2020-01-07 DIAGNOSIS — I4811 Longstanding persistent atrial fibrillation: Secondary | ICD-10-CM

## 2020-01-07 DIAGNOSIS — E1159 Type 2 diabetes mellitus with other circulatory complications: Secondary | ICD-10-CM

## 2020-01-07 DIAGNOSIS — Z951 Presence of aortocoronary bypass graft: Secondary | ICD-10-CM

## 2020-01-07 DIAGNOSIS — Z9889 Other specified postprocedural states: Secondary | ICD-10-CM | POA: Diagnosis not present

## 2020-01-07 DIAGNOSIS — I10 Essential (primary) hypertension: Secondary | ICD-10-CM

## 2020-01-07 DIAGNOSIS — I2510 Atherosclerotic heart disease of native coronary artery without angina pectoris: Secondary | ICD-10-CM

## 2020-01-07 DIAGNOSIS — E785 Hyperlipidemia, unspecified: Secondary | ICD-10-CM

## 2020-01-07 DIAGNOSIS — Z7901 Long term (current) use of anticoagulants: Secondary | ICD-10-CM

## 2020-01-07 MED ORDER — AMLODIPINE BESYLATE 2.5 MG PO TABS: 2.5000 mg | ORAL_TABLET | Freq: Every day | ORAL | 3 refills | Status: DC

## 2020-01-07 NOTE — Progress Notes (Signed)
Patient ID: Derek Wells, male   DOB: Feb 02, 1933, 86 y.o.   MRN: [id number]     HPI: Derek Wells is a 86 y.o. male who presents to the office today for a 18 month followup cardiology evaluation.   Derek Wells has a history of hypertension, hyperlipidemia, and is s/p abdominal aortic aneurysm repair in April 2010. I had seen him on 01/14/2013 at which time he had experienced symptoms worrisome for unstable angina. Previously, in September 2013 he had undergone a nuclear perfusion study  which was essentially normal although did show mild diaphragmatic attenuation. He also been found to have a atherogenic dyslipidemia pattern with reference to his lipid panel. Catheterization the following day on 01/15/2013 revealed life-threatening coronary anatomy with 60-70% ostial left main stenosis, 90-95% distal left main stenosis, 95-99% ostial LAD stenosis, and mild/moderate stenoses in the RCA. That same day he was successfully operated by Dr. Arvid Right and underwent CABG x3 with a LIMA to the LAD, SVG to the OM, and SVG to the PDA. He had a unremarkable postoperative course and was discharged 4 days later.  He saw [name] on 01/27/13 and was doing well. I saw him in followup on 03/02/2013 and he  participating  in cardiac rehabilitation. When I saw him in September 2014, he was in sinus rhythm and has and had resumed a good level of activity.   In January 2015, he was found to be in atrial flutter at cardiac rehabilitation. He was unaware of his heart rhythm being abnormal. He had developed a URI over the holidays prior to that and had just completed a steroid dose pack. Xarelto 20 mg was added to his medical regimen. He did undergo subsequent blood work on January 20 which showed normal renal function and normal electrolytes. Liver function studies were normal. Hemoglobin was 13.7 hematocrit 41.7. Platelets were normal. TSH was normal at 2.63. Magnesium was normal at 2.0.  He underwent successful cardioversion for  his atrial flutter on 11/12/2013 and he cardioverted to sinus rhythm at 100 J.  Since his cardioversion, he is unaware of any recurrent rhythm disturbance.   He is in the maintenance phase of cardiac rehabilitation.  When I  saw him in June 2015, he told me that he had noticed several episodes of a vague chest sensation particularly when he rolls his garbage can back up a hill at home.    On 03/18/2014, he underwent a nuclear perfusion study, which showed reduced exercise capacity with a workload of only 5.9.  He did experience mild shortness of breath.  There was no significant ST depression, but he had developed transient left bundle branch block.  The scan was interpreted at low as low risk.  Ejection fraction was 65% without wall motion abnormalities.  An echo Doppler study showed an EF of 60-65%.  There were no regional wall motion abnormalities.  He had mild biatrial enlargement.  Derek Wells currently is playing golf at least 2 times per week and occasionally 3.  He is in the maintenance phase of cardiac rehabilitation.  He does admit to some weight gain.  He denies any episodes of chest pain, particularly during the cardiac rehabilitation program.  He has noticed some mild shortness of breath with deep breathing and some vague pressure in his chest which is short-lived walking up hills when he walks fast with his wife.  He has noticed some mild shortness of breath walking up steep hills in the golf course without chest pressure.  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 2016.  When I saw him in 2017 he was anemic and had significantly reduced iron saturation at 6%, and his serum ferritin was 7.  I referred him for GI evaluation and underwent both colonoscopy and endoscopy by Dr. [name] and 4 polyps were removed from his colon.  He was not having any active bleeding.  He does note improved energy.  He had taken Niferex initially and transitioned to over-the-counter iron. He  has more energy.  He is continuing with cardiac rehabilitation.  He underwent a follow-up abdominal ultrasound on 06/09/2015 which showed a patent aortobifemoral bypass graft without focal dilatation or stenosis.   In the late summer of 2017 he was at the beach for several weeks and played golf heat without chest pain.  However with fast walking he noted chest pressure.  For the past month he has had a cough.  He has a ventral hernia.  When I saw him, his ECG showed progressive PR prolongation.  I reduced his Lopressor back to 25 mg daily and added amlodipine 5 mg blood pressure and his chest tightness.  I scheduled him for a nuclear perfusion study which was done on 07/23/2016.  Ejection fraction was 62%.  There were no ECG changes ischemia, but there was a rare PVC.  He had normal perfusion without scar or ischemia.  His follow-up laboratory showed significant improvement with his iron saturation, now at 27%.  Lipid studies revealed a total cholesterol 164, triglycerides 122, HDL 40, and LDL 100.    When I last saw him in January 2018, I suggested the addition of Zetia to his atorvastatin.  In the past.  He did not tolerate 80 mg, atorvastatin, and also had myalgias on 40 mg.  I also discussed plaque regression studies with Repatha.  He developed recurrent atrial fibrillation and had multiple evaluations in the A. fib clinic commencing in July 2018.  Ultimately, on 05/13/2017, he underwent successful cardioversion with restoration of sinus rhythm.  He saw [name] in follow-up of his cardioversion and was maintaining sinus rhythm.  There was discussion concerning his EtOH use and reduction in consumption.  He typically has at least 1 or more vodka every day.    When I lsaw him in February 2019 he was continuing to feel well and denied any recurrent episodes of atrial fibrillation.  His last echo Doppler study in October 2018 showed normal LV systolic and diastolic function.  He had biatrial enlargement  with mild aortic valve stenosis, and mean gradient of 14 and peak gradient of 25.  On follow-up abdominal aortic Doppler study the largest aortic measurement was 2.7 cm.   He was playing golf several days per week and was continue to participate in cardiac rehab.  On the evening of May 12, 2018 he was going up steps to bed developed an episode of full sensation in his throat chest with some radiate this was associated with palpitations.  Symptoms lasted approximately 10 minutes.  He was seen in the following morning relation clinic and was found to be in atrial flutter with a controlled ventricular rate.  He was scheduled to undergo DC cardioversion but upon arrival for his cardioversion procedure his ECG showed that he had converted back to sinus rhythm and had PACs.  I last saw on May 29, 2018.  He admitted to occasional lightheadedness and sensation of head fog.  He denied any exertional chest tightness.  He was continuing to drink occasional vodka but admits that his drinking is less.  He was sleeping well.  He denied any significant snoring or daytime sleepiness.  Elder Love revealed normal thyroid function studies.  History was normal although glucose was minimally increased at 103.  CBC was normal.  LDL cholesterol was 71 but triglycerides are elevated at 173 total cholesterol 143 and HDL 37.  Because of increased cardiac murmur on auscultation he underwent a follow-up echo Doppler study in June 25, 2018.  This continued to show excellent ejection fraction at 60 to 65% with grade 2 diastolic dysfunction.  There was mild aortic valve stenosis with a mean gradient of 14, peak instantaneous gradient of 29, and calculated valve area of 1.6 8 cm.  When I saw him last on July 07, 2018 he was maintaining sinus rhythm and was without chest pain PND orthopnea although he still noted mild shortness of breath when bending over.  He was playing golf several days per week.    Since I last saw him in  October 2019  he apparently has had recurrent issues with atrial fibrillation.  He has been seen by Kerin Ransom, PA, Almyra Deforest, [place],  [name], NP and Coletta Memos, NP.  Apparently he has been in longstanding persistent atrial fibrillation for well over a year.  He has been on chronic anticoagulation with Eliquis.  Presently he denies any anginal symptoms.  He continues to play golf 2 days/week and does note some mild shortness of breath with uphill walking. He walks his dog on most days.  His last echo Doppler study was in October 2019.  He is not had recent laboratory.  He presents for evaluation.  Past Medical History:  Diagnosis Date  . CAD (coronary artery disease), severe needing CABG 01/19/2013  . Chest pain    2D ECHO, 06/16/2012 - EF 50-55%, borderline low left ventricular systolic function  . Coronary artery disease   . Dyspnea on exertion    LEXISCAN, 06/17/2012 - fixed inferior bowel artifact and basal septal defect, probably related to IVCD/incomplete LBBB, no reversible ischemia, post-stress EF 66%  . History of AAA (abdominal aortic aneurysm) repair    ABDOMINAL AORTIC DOPPLER, 06/16/2013 - normal  . HTN (hypertension) 01/19/2013  . Hyperlipidemia LDL goal < 70 01/19/2013  . Hypertension   . S/P CABG x 3 01/19/2013    Past Surgical History:  Procedure Laterality Date  . CARDIAC CATHETERIZATION  01/15/2013   Severe coronary obstructive disease with 60-70% ostial left main and 90-85% distal LAD stenosis and mild-moderate stenoses in RCA, recommended for CABG  . CARDIOVERSION N/A 11/12/2013   Procedure: CARDIOVERSION;  Surgeon: Troy Sine, MD;  Location: [facility] ENDOSCOPY;  Service: Cardiovascular;  Laterality: N/A;  11:16 Lido 106m, Propofol 1253mIV 11:19 synch 100 joules conversion conversion to NSR....  . CARDIOVERSION N/A 05/13/2017   Procedure: CARDIOVERSION;  Surgeon: NaAcie FredricksonhWonda ChengMD;  [facility];  Service: Cardiovascular;  Laterality: N/A;  . CORONARY ARTERY  BYPASS GRAFT N/A 01/15/2013   Procedure: Coronary Artery Bypass Graft times three using Right Greater Saphenous Vein Graft harvested endoscopically and Left Internal Mammary Artery and;  Surgeon: BrGaye PollackMD;  [facility];  Service: Open Heart Surgery;  Laterality: N/A;  . INTRAOPERATIVE TRANSESOPHAGEAL ECHOCARDIOGRAM  01/15/2013   Procedure: INTRAOPERATIVE TRANSESOPHAGEAL ECHOCARDIOGRAM;  Surgeon: BrGaye PollackMD;  [facility];  Service: Open Heart Surgery;;  . LEFT HEART CATHETERIZATION WITH CORONARY ANGIOGRAM N/A 01/15/2013  Procedure: LEFT HEART CATHETERIZATION WITH CORONARY ANGIOGRAM;  Surgeon: Troy Sine, MD;  Location: [facility] CATH LAB;  Service: Cardiovascular;  Laterality: N/A;  . VASCULAR SURGERY      Allergies  Allergen Reactions  . Penicillins Rash    Has patient had a PCN reaction causing immediate rash, facial/tongue/throat swelling, SOB or lightheadedness with hypotension: no Has patient had a PCN reaction causing severe rash involving mucus membranes or skin necrosis: no Has patient had a PCN reaction that required hospitalization: no Has patient had a PCN reaction occurring within the last 10 years: yes If all of the above answers are "NO", then may proceed with Cephalosporin use.     Current Outpatient Medications  Medication Sig Dispense Refill  . atorvastatin (LIPITOR) 40 MG tablet TAKE 1 TABLET BY MOUTH EVERY DAY 90 tablet 0  . ELIQUIS 5 MG TABS tablet TAKE 1 TABLET BY MOUTH TWICE DAILY 60 tablet 6  . furosemide (LASIX) 20 MG tablet TAKE 1 TABLET(20 MG) BY MOUTH DAILY 90 tablet 3  . irbesartan (AVAPRO) 150 MG tablet TAKE 1 TABLET(150 MG) BY MOUTH DAILY 90 tablet 2  . metFORMIN (GLUCOPHAGE) 500 MG tablet Take 500 mg by mouth daily with breakfast.     . Multiple Vitamin (MULTIVITAMIN WITH MINERALS) TABS Take 1 tablet by mouth daily.    Marland Kitchen amLODipine (NORVASC) 2.5 MG tablet Take 1 tablet (2.5 mg total) by mouth daily. 90 tablet 3   No current  facility-administered medications for this visit.    Socially he is re-married. His first wife of 42 years passed away. He previously had smoked cigars prior to his bypass surgery but has not had any tobacco use since.  He continues to play golf at least 2 times per week.  He does a lot of leg exercise with cardiac rehabilitation.  ROS General: Negative; No fevers, chills, or night sweats;  HEENT: Negative; No changes in vision or hearing, sinus congestion, difficulty swallowing Pulmonary: URI symptoms during the winter; No cough, wheezing, shortness of breath, hemoptysis Cardiovascular: Mild shortness of breath with uphill walking; see history of present illness GI: Negative; No nausea, vomiting, diarrhea, or abdominal pain GU: Negative; No dysuria, hematuria, or difficulty voiding Musculoskeletal: Negative; no myalgias, joint pain, or weakness Hematologic/Oncology: Negative; no easy bruising, bleeding Endocrine: Negative; no heat/cold intolerance; no diabetes Neuro: Negative; no changes in balance, headaches Skin: Removal of small left forearm melanoma. Psychiatric: Negative; No behavioral problems, depression Sleep: Negative; No snoring, daytime sleepiness, hypersomnolence, bruxism, restless legs, hypnogognic hallucinations, no cataplexy Other comprehensive 14 point system review is negative.   PE BP (!) 160/58   Pulse (!) 53   Ht 6' 1.5" (1.867 m)   Wt 238 lb 3.2 oz (108 kg)   BMI 31.00 kg/m    Repeat blood pressure by me was elevated at 148/68  Wt Readings from Last 3 Encounters:  01/07/20 238 lb 3.2 oz (108 kg)  05/13/19 238 lb 6.4 oz (108.1 kg)  12/03/18 240 lb (108.9 kg)   General: Alert, oriented, no distress.  Skin: normal turgor, no rashes, warm and dry HEENT: Normocephalic, atraumatic. Pupils equal round and reactive to light; sclera anicteric; extraocular muscles intact;  Nose without nasal septal hypertrophy Mouth/Parynx benign; Mallinpatti scale 3 Neck: No  JVD, no carotid bruits; normal carotid upstroke Lungs: clear to ausculatation and percussion; no wheezing or rales Chest wall: without tenderness to palpitation Heart: PMI not displaced, RRR, s1 s2 normal, 2/6 mid peaking systolic murmur in the aortic area,  no diastolic murmur, no rubs, gallops, thrills, or heaves Abdomen: Diastases recti; soft, nontender; no hepatosplenomehaly, BS+; abdominal aorta nontender and not dilated by palpation. Back: no CVA tenderness Pulses 2+ Musculoskeletal: full range of motion, normal strength, no joint deformities Extremities: no clubbing cyanosis or edema, Homan's sign negative  Neurologic: grossly nonfocal; Cranial nerves grossly wnl Psychologic: Normal mood and affect   ECG (independently read by me): Atrial fibrillation with an average ventricular rate of 53 bpm with a range of in the 60s to 40s.  One isolated PVC.  Nonspecific interventricular block.  July 07, 2020 ECG (independently read by me): Sinus bradycardia with first-degree AV block.  PR interval 392 ms.  Incomplete left bundle branch block.  May 29, 2018 ECG (independently read by me): Sinus rhythm with first-degree AV block at 72 bpm with a PR interval of 312 ms.  Occasional PACs.  February 2019 ECG (independently read by me): Sinus rhythm at 63 bpm.  First-degree AV block with a PR interval at 344 ms.  PAC.  Nonspecific interventricular conduction delay.  October 2018 ECG (independently read by me): Sinus rhythm with first-degree AV block with a PR interval of 314 ms.  Nonspecific interventricular block.  QTc interval 431  January 2018 ECG (independently read by me): Normal sinus rhythm with first-degree AV block.  PR interval 300 ms.  Nonspecific interventricular conduction delay.  October 2017 ECG (independently read by me): Normal sinus rhythm at 66 bpm.  First-degree block with a PR interval at 290 ms.  Nonspecific interventricular block.  03/02/2015 ECG (independently read by  me): Sinus rhythm at 75 bpm.  Occasional unifocal PVCs.  Nonspecific interventricular block.  Prior October 2015 ECG (independently read by me): Normal sinus rhythm at 67 beats per minute.  First degree A-V block with PR interval at 290 ms.  QTc interval 439 ms.  No ST segment changes.  Prior June 2015 ECG (independently read by me): Normal sinus rhythm at 68 beats per minute with first degree AV block with a PR interval at 284 ms.  QTc interval 435 ms.  No significant ST-T changes.  T-wave inversion in aVL  ECG today (independently read by me):  Normal sinus rhythm at 71 beats per minute. First create a block with PR interval at 240 ms. QTc interval normal at 4-5 ms.  Prior 10/27/13 ECG (independently read by me): Atrial flutter with variable rate but predominantly 4-1 with occasional 3-1 block; average heart rate 70 beats per minute  Prior ECG from 06/04/2013: Sinus rhythm with first-degree AV block. Nonspecific interventricular block  LABS: BMP Latest Ref Rng & Units 05/10/2019 05/26/2018 07/21/2017  Glucose 65 - 99 mg/dL 107(H) 103(H) 106(H)  BUN 8 - 27 mg/dL '15 15 16  ' Creatinine 0.76 - 1.27 mg/dL 0.96 0.96 0.86  BUN/Creat Ratio 10 - '24 16 16 19  ' Sodium 134 - 144 mmol/L 141 140 140  Potassium 3.5 - 5.2 mmol/L 4.8 4.5 4.3  Chloride 96 - 106 mmol/L 104 105 102  CO2 20 - 29 mmol/L '22 23 21  ' Calcium 8.6 - 10.2 mg/dL 8.9 9.6 9.3   Hepatic Function Latest Ref Rng & Units 05/10/2019 05/26/2018 07/21/2017  Total Protein 6.0 - 8.5 g/dL 6.6 6.7 6.6  Albumin 3.6 - 4.6 g/dL 4.4 4.4 4.4  AST 0 - 40 IU/L '16 15 19  ' ALT 0 - 44 IU/L '13 15 20  ' Alk Phosphatase 39 - 117 IU/L 69 66 66  Total Bilirubin 0.0 - 1.2 mg/dL 0.8 0.8  1.0  Bilirubin, Direct 0.00 - 0.40 mg/dL - - -   CBC Latest Ref Rng & Units 05/10/2019 05/26/2018 07/21/2017  WBC 3.4 - 10.8 x10E3/uL 8.0 7.9 8.4  Hemoglobin 13.0 - 17.7 g/dL 12.1(L) 14.7 14.5  Hematocrit 37.5 - 51.0 % 38.0 43.6 41.7  Platelets 150 - 450 x10E3/uL 258 267 241   Lab  Results  Component Value Date   MCV 83 05/10/2019   MCV 90 05/26/2018   MCV 88 07/21/2017   Lab Results  Component Value Date   TSH 2.610 05/10/2019   Lipid Panel     Component Value Date/Time   CHOL 131 05/10/2019 0953   CHOL 111 03/01/2015 1033   TRIG 101 05/10/2019 0953   TRIG 90 03/01/2015 1033   HDL 40 05/10/2019 0953   HDL 41 03/01/2015 1033   CHOLHDL 3.3 05/10/2019 0953   CHOLHDL 4.1 07/19/2016 0949   VLDL 24 07/19/2016 0949   LDLCALC 71 05/10/2019 0953   LDLCALC 52 03/01/2015 1033     RADIOLOGY: No results found.  IMPRESSION:  1. Essential hypertension   2. CAD in native artery   3. Hx of CABG   4. Longstanding persistent atrial fibrillation ([place])   5. S/P AAA repair   6. Anticoagulation adequate   7. Type 2 diabetes mellitus with other circulatory complication, without long-term current use of insulin ([place])   8. Aortic stenosis, mild   9. Hyperlipidemia with target LDL less than 70     ASSESSMENT AND PLAN: Derek Wells is an active 86 year old white male who is status post emergent CABG revascularization surgery after cardiac catheterization on 01/15/2013 demonstrated severe life threatening coronary anatomy.  He  developed atrial flutter in January 2015 and was started on anticoagulation with Xarelto and underwent cardioversion.  He developed recurrent episodes of atrial fibrillation in 2018 and had maintained sinus rhythm since his repeat cardioversion on 05/13/2017.  He again developed recurrent atrial flutter 1 year later in May 13, 2018 was found to have a flutter with controlled rate.  When I had last seen him in October 2019 he was maintaining sinus rhythm.  However since that time he has been seen on multiple occasions by numerous providers and he is now felt to have longstanding persistent atrial fibrillation.  He has been maintained on Eliquis for anticoagulation.  His blood pressure today is increased and on repeat by me was 148/68 despite taking Eliquis  150 mg and furosemide 20 mg daily.  I am electing to add amlodipine initially at 2.5 mg daily but this may need to be further titrated depending upon blood pressure response.  He has a 2/6 mid peaking cardiac murmur consistent with aortic stenosis.  His last echo Doppler study is done in 2019 which showed normal EF at 60 to 65% with a mean aortic gradient of 14 consistent with mild aortic stenosis.  He had grade 2 diastolic dysfunction.  I am recommending a follow-up echo Doppler study for reassessment.  He is on atorvastatin 40 mg for hyperlipidemia with target LDL less than 70.  I will also schedule him to undergo follow-up laboratory in the fasting state including a comprehensive metabolic panel, TSH, CBC, hemoglobin A1c as well as lipid studies.  He is on Metformin for diabetes mellitus.  I will see him back in the office in 2 to 3 months for follow-up evaluation.  Troy Sine, MD, [facility]  01/09/2020 12:32 PM

## 2020-01-07 NOTE — Patient Instructions (Addendum)
Medication Instructions:  BEGIN TAKING AMLODIPINE 2.5MG  DAILY (1 TABLET) *If you need a refill on your cardiac medications before your next appointment, please call your pharmacy*   Lab Work: Fasting Labs: cmet tsh Lipid Cbc HGBA1C  If you have labs (blood work) drawn today and your tests are completely normal, you will receive your results only by: Marland Kitchen MyChart Message (if you have MyChart) OR . A paper copy in the mail If you have any lab test that is abnormal or we need to change your treatment, we will call you to review the results.   Testing/Procedures: Your physician has requested that you have an echocardiogram. Echocardiography is a painless test that uses sound waves to create images of your heart. It provides your doctor with information about the size and shape of your heart and how well your heart's chambers and valves are working. This procedure takes approximately one hour. There are no restrictions for this procedure.  [address]   Follow-Up: At [facility], you and your health needs are our priority.  As part of our continuing mission to provide you with exceptional heart care, we have created designated Provider Care Teams.  These Care Teams include your primary Cardiologist (physician) and Advanced Practice Providers (APPs -  Physician Assistants and Nurse Practitioners) who all work together to provide you with the care you need, when you need it.  We recommend signing up for the patient portal called "MyChart".  Sign up information is provided on this After Visit Summary.  MyChart is used to connect with patients for Virtual Visits (Telemedicine).  Patients are able to view lab/test results, encounter notes, upcoming appointments, etc.  Non-urgent messages can be sent to your provider as well.   To learn more about what you can do with MyChart, go to [email or web address].    Your next appointment:   3 month(s)  The format for your next appointment:    In Person  Provider:   Nicki Guadalajara, MD

## 2020-01-09 ENCOUNTER — Encounter: Payer: Self-pay | Admitting: Cardiovascular Disease

## 2020-01-10 DIAGNOSIS — I25810 Atherosclerosis of coronary artery bypass graft(s) without angina pectoris: Secondary | ICD-10-CM | POA: Diagnosis not present

## 2020-01-10 DIAGNOSIS — Z9889 Other specified postprocedural states: Secondary | ICD-10-CM | POA: Diagnosis not present

## 2020-01-10 DIAGNOSIS — I4811 Longstanding persistent atrial fibrillation: Secondary | ICD-10-CM | POA: Diagnosis not present

## 2020-01-10 DIAGNOSIS — I10 Essential (primary) hypertension: Secondary | ICD-10-CM | POA: Diagnosis not present

## 2020-01-11 LAB — LIPID PANEL
Chol/HDL Ratio: 3.5 ratio (ref 0.0–5.0)
Cholesterol, Total: 125 mg/dL (ref 100–199)
HDL: 36 mg/dL — ABNORMAL LOW (ref >39)
LDL Chol Calc (NIH): 68 mg/dL (ref 0–99)
Triglycerides: 113 mg/dL (ref 0–149)
VLDL Cholesterol Cal: 21 mg/dL (ref 5–40)

## 2020-01-11 LAB — COMPREHENSIVE METABOLIC PANEL
ALT: 16 IU/L (ref 0–44)
AST: 16 IU/L (ref 0–40)
Albumin/Globulin Ratio: 1.7 (ref 1.2–2.2)
Albumin: 4.3 g/dL (ref 3.6–4.6)
Alkaline Phosphatase: 78 IU/L (ref 39–117)
BUN/Creatinine Ratio: 14 (ref 10–24)
BUN: 13 mg/dL (ref 8–27)
Bilirubin Total: 1.1 mg/dL (ref 0.0–1.2)
CO2: 21 mmol/L (ref 20–29)
Calcium: 9.6 mg/dL (ref 8.6–10.2)
Chloride: 105 mmol/L (ref 96–106)
Creatinine, Ser: 0.9 mg/dL (ref 0.76–1.27)
GFR calc Af Amer: 90 mL/min/{1.73_m2} (ref >59)
GFR calc non Af Amer: 78 mL/min/{1.73_m2} (ref >59)
Globulin, Total: 2.6 g/dL (ref 1.5–4.5)
Glucose: 99 mg/dL (ref 65–99)
Potassium: 4.7 mmol/L (ref 3.5–5.2)
Sodium: 144 mmol/L (ref 134–144)
Total Protein: 6.9 g/dL (ref 6.0–8.5)

## 2020-01-11 LAB — CBC
Hematocrit: 41.6 % (ref 37.5–51.0)
Hemoglobin: 13.8 g/dL (ref 13.0–17.7)
MCH: 28.8 pg (ref 26.6–33.0)
MCHC: 33.2 g/dL (ref 31.5–35.7)
MCV: 87 fL (ref 79–97)
Platelets: 228 10*3/uL (ref 150–450)
RBC: 4.8 x10E6/uL (ref 4.14–5.80)
RDW: 14.9 % (ref 11.6–15.4)
WBC: 7.4 10*3/uL (ref 3.4–10.8)

## 2020-01-11 LAB — TSH: TSH: 2.5 u[IU]/mL (ref 0.450–4.500)

## 2020-01-11 LAB — HEMOGLOBIN A1C
Est. average glucose Bld gHb Est-mCnc: 120 mg/dL
Hgb A1c MFr Bld: 5.8 % — ABNORMAL HIGH (ref 4.8–5.6)

## 2020-01-26 DIAGNOSIS — H0100A Unspecified blepharitis right eye, upper and lower eyelids: Secondary | ICD-10-CM | POA: Diagnosis not present

## 2020-01-26 DIAGNOSIS — H0100B Unspecified blepharitis left eye, upper and lower eyelids: Secondary | ICD-10-CM | POA: Diagnosis not present

## 2020-01-26 DIAGNOSIS — H40013 Open angle with borderline findings, low risk, bilateral: Secondary | ICD-10-CM | POA: Diagnosis not present

## 2020-02-08 DIAGNOSIS — I4892 Unspecified atrial flutter: Secondary | ICD-10-CM | POA: Diagnosis not present

## 2020-02-08 DIAGNOSIS — I25119 Atherosclerotic heart disease of native coronary artery with unspecified angina pectoris: Secondary | ICD-10-CM | POA: Diagnosis not present

## 2020-02-08 DIAGNOSIS — I10 Essential (primary) hypertension: Secondary | ICD-10-CM | POA: Diagnosis not present

## 2020-02-08 DIAGNOSIS — E119 Type 2 diabetes mellitus without complications: Secondary | ICD-10-CM | POA: Diagnosis not present

## 2020-02-14 ENCOUNTER — Other Ambulatory Visit: Payer: Self-pay

## 2020-02-14 ENCOUNTER — Ambulatory Visit (HOSPITAL_COMMUNITY): Payer: Medicare Other | Attending: Cardiology

## 2020-02-14 DIAGNOSIS — I10 Essential (primary) hypertension: Secondary | ICD-10-CM

## 2020-02-14 DIAGNOSIS — Z8679 Personal history of other diseases of the circulatory system: Secondary | ICD-10-CM | POA: Diagnosis not present

## 2020-02-14 DIAGNOSIS — Z9889 Other specified postprocedural states: Secondary | ICD-10-CM

## 2020-02-14 DIAGNOSIS — I4811 Longstanding persistent atrial fibrillation: Secondary | ICD-10-CM | POA: Insufficient documentation

## 2020-03-20 ENCOUNTER — Other Ambulatory Visit: Payer: Self-pay | Admitting: Cardiovascular Disease

## 2020-03-22 ENCOUNTER — Other Ambulatory Visit: Payer: Self-pay

## 2020-03-22 MED ORDER — IRBESARTAN 150 MG PO TABS: ORAL_TABLET | ORAL | 2 refills | Status: DC

## 2020-04-23 DEATH — deceased

## 2020-06-18 ENCOUNTER — Other Ambulatory Visit: Payer: Self-pay | Admitting: Cardiology

## 2020-06-28 DIAGNOSIS — H40023 Open angle with borderline findings, high risk, bilateral: Secondary | ICD-10-CM | POA: Diagnosis not present

## 2020-06-28 DIAGNOSIS — E119 Type 2 diabetes mellitus without complications: Secondary | ICD-10-CM | POA: Diagnosis not present

## 2020-06-28 DIAGNOSIS — H26493 Other secondary cataract, bilateral: Secondary | ICD-10-CM | POA: Diagnosis not present

## 2020-06-28 DIAGNOSIS — H43813 Vitreous degeneration, bilateral: Secondary | ICD-10-CM | POA: Diagnosis not present

## 2020-07-10 ENCOUNTER — Ambulatory Visit: Payer: Medicare Other | Admitting: Cardiovascular Disease

## 2020-07-10 ENCOUNTER — Encounter: Payer: Self-pay | Admitting: Cardiovascular Disease

## 2020-07-10 ENCOUNTER — Other Ambulatory Visit: Payer: Self-pay

## 2020-07-10 VITALS — BP 130/85 | HR 54 | Ht 73.5 in | Wt 232.0 lb

## 2020-07-10 DIAGNOSIS — Z8679 Personal history of other diseases of the circulatory system: Secondary | ICD-10-CM

## 2020-07-10 DIAGNOSIS — Z9889 Other specified postprocedural states: Secondary | ICD-10-CM

## 2020-07-10 DIAGNOSIS — Z951 Presence of aortocoronary bypass graft: Secondary | ICD-10-CM

## 2020-07-10 DIAGNOSIS — I350 Nonrheumatic aortic (valve) stenosis: Secondary | ICD-10-CM

## 2020-07-10 DIAGNOSIS — I2510 Atherosclerotic heart disease of native coronary artery without angina pectoris: Secondary | ICD-10-CM

## 2020-07-10 DIAGNOSIS — I4811 Longstanding persistent atrial fibrillation: Secondary | ICD-10-CM

## 2020-07-10 DIAGNOSIS — I10 Essential (primary) hypertension: Secondary | ICD-10-CM

## 2020-07-10 DIAGNOSIS — E1159 Type 2 diabetes mellitus with other circulatory complications: Secondary | ICD-10-CM

## 2020-07-10 DIAGNOSIS — Z7901 Long term (current) use of anticoagulants: Secondary | ICD-10-CM

## 2020-07-10 NOTE — Patient Instructions (Addendum)
Medication Instructions:  NO CHANGES *If you need a refill on your cardiac medications before your next appointment, please call your pharmacy*   Lab Work: NOT NEEDED   Testing/Procedures: [address] Your physician has requested that you have an echocardiogram. Echocardiography is a painless test that uses sound waves to create images of your heart. It provides your doctor with information about the size and shape of your heart and how well your heart's chambers and valves are working. This procedure takes approximately one hour. There are no restrictions for this procedure.   AND  [address]  Your physician has requested that you have an abdominal aorta duplex. During this test, an ultrasound is used to evaluate the aorta. Allow 30 minutes for this exam. Do not eat after midnight the day before and avoid carbonated beverages  Follow-Up: At [facility], you and your health needs are our priority.  As part of our continuing mission to provide you with exceptional heart care, we have created designated Provider Care Teams.  These Care Teams include your primary Cardiologist (physician) and Advanced Practice Providers (APPs -  Physician Assistants and Nurse Practitioners) who all work together to provide you with the care you need, when you need it.    Your next appointment:   6 month(s) April   The format for your next appointment:   In Person  Provider:   Nicki Guadalajara, MD

## 2020-07-10 NOTE — Progress Notes (Signed)
Patient ID: Derek Wells, male   DOB: 1933-09-16, 86 y.o.   MRN: [id number]     HPI: Derek Wells is a 86 y.o. male who presents to the office today for a 6 month followup cardiology evaluation.   Derek Wells has a history of hypertension, hyperlipidemia, and is s/p abdominal aortic aneurysm repair in April 2010. I had seen him on 01/14/2013 at which time he had experienced symptoms worrisome for unstable angina. Previously, in September 2013 he had undergone a nuclear perfusion study  which was essentially normal although did show mild diaphragmatic attenuation. He also been found to have a atherogenic dyslipidemia pattern with reference to his lipid panel. Catheterization the following day on 01/15/2013 revealed life-threatening coronary anatomy with 60-70% ostial left main stenosis, 90-95% distal left main stenosis, 95-99% ostial LAD stenosis, and mild/moderate stenoses in the RCA. That same day he was successfully operated by Dr. Arvid Right and underwent CABG x3 with a LIMA to the LAD, SVG to the OM, and SVG to the PDA. He had a unremarkable postoperative course and was discharged 4 days later.  He saw [name] on 01/27/13 and was doing well. I saw him in followup on 03/02/2013 and he  participating  in cardiac rehabilitation. When I saw him in September 2014, he was in sinus rhythm and has and had resumed a good level of activity.   In January 2015, he was found to be in atrial flutter at cardiac rehabilitation. He was unaware of his heart rhythm being abnormal. He had developed a URI over the holidays prior to that and had just completed a steroid dose pack. Xarelto 20 mg was added to his medical regimen. He did undergo subsequent blood work on January 20 which showed normal renal function and normal electrolytes. Liver function studies were normal. Hemoglobin was 13.7 hematocrit 41.7. Platelets were normal. TSH was normal at 2.63. Magnesium was normal at 2.0.  He underwent successful cardioversion for  his atrial flutter on 11/12/2013 and he cardioverted to sinus rhythm at 100 J.  Since his cardioversion, he is unaware of any recurrent rhythm disturbance.   He is in the maintenance phase of cardiac rehabilitation.  When I  saw him in June 2015, he told me that he had noticed several episodes of a vague chest sensation particularly when he rolls his garbage can back up a hill at home.    On 03/18/2014, he underwent a nuclear perfusion study, which showed reduced exercise capacity with a workload of only 5.9.  He did experience mild shortness of breath.  There was no significant ST depression, but he had developed transient left bundle branch block.  The scan was interpreted at low as low risk.  Ejection fraction was 65% without wall motion abnormalities.  An echo Doppler study showed an EF of 60-65%.  There were no regional wall motion abnormalities.  He had mild biatrial enlargement.  Derek Wells currently is playing golf at least 2 times per week and occasionally 3.  He is in the maintenance phase of cardiac rehabilitation.  He does admit to some weight gain.  He denies any episodes of chest pain, particularly during the cardiac rehabilitation program.  He has noticed some mild shortness of breath with deep breathing and some vague pressure in his chest which is short-lived walking up hills when he walks fast with his wife.  He has noticed some mild shortness of breath walking up steep hills in the golf [place] without chest pressure.  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 2016.  When I saw him in 2017 he was anemic and had significantly reduced iron saturation at 6%, and his serum ferritin was 7.  I referred him for GI evaluation and underwent both colonoscopy and endoscopy by Dr. [name] and 4 polyps were removed from his colon.  He was not having any active bleeding.  He does note improved energy.  He had taken Niferex initially and transitioned to over-the-counter iron. He  has more energy.  He is continuing with cardiac rehabilitation.  He underwent a follow-up abdominal ultrasound on 06/09/2015 which showed a patent aortobifemoral bypass graft without focal dilatation or stenosis.   In the late summer of 2017 he was at the beach for several weeks and played golf heat without chest pain.  However with fast walking he noted chest pressure.  For the past month he has had a cough.  He has a ventral hernia.  When I saw him, his ECG showed progressive PR prolongation.  I reduced his Lopressor back to 25 mg daily and added amlodipine 5 mg blood pressure and his chest tightness.  I scheduled him for a nuclear perfusion study which was done on 07/23/2016.  Ejection fraction was 62%.  There were no ECG changes ischemia, but there was a rare PVC.  He had normal perfusion without scar or ischemia.  His follow-up laboratory showed significant improvement with his iron saturation, now at 27%.  Lipid studies revealed a total cholesterol 164, triglycerides 122, HDL 40, and LDL 100.    When I saw him in January 2018, I suggested the addition of Zetia to his atorvastatin.  In the past.  He did not tolerate 80 mg, atorvastatin, and also had myalgias on 40 mg.  I also discussed plaque regression studies with Repatha.  He developed recurrent atrial fibrillation and had multiple evaluations in the A. fib clinic commencing in July 2018.  Ultimately, on 05/13/2017, he underwent successful cardioversion with restoration of sinus rhythm.  He saw [name] in follow-up of his cardioversion and was maintaining sinus rhythm.  There was discussion concerning his EtOH use and reduction in consumption.  He typically has at least 1 or more vodka every day.    When I lsaw him in February 2019 he was continuing to feel well and denied any recurrent episodes of atrial fibrillation.  His last echo Doppler study in October 2018 showed normal LV systolic and diastolic function.  He had biatrial enlargement with  mild aortic valve stenosis, and mean gradient of 14 and peak gradient of 25.  On follow-up abdominal aortic Doppler study the largest aortic measurement was 2.7 cm.   He was playing golf several days per week and was continue to participate in cardiac rehab.  On the evening of May 12, 2018 he was going up steps to bed developed an episode of full sensation in his throat chest with some radiate this was associated with palpitations.  Symptoms lasted approximately 10 minutes.  He was seen in the following morning relation clinic and was found to be in atrial flutter with a controlled ventricular rate.  He was scheduled to undergo DC cardioversion but upon arrival for his cardioversion procedure his ECG showed that he had converted back to sinus rhythm and had PACs.  I last saw on May 29, 2018.  He admitted to occasional lightheadedness and sensation of head fog.  He denied any exertional chest tightness.  He was continuing to drink occasional vodka but admits that his drinking is less.  He was sleeping well.  He denied any significant snoring or daytime sleepiness.  Elder Love revealed normal thyroid function studies.  History was normal although glucose was minimally increased at 103.  CBC was normal.  LDL cholesterol was 71 but triglycerides are elevated at 173 total cholesterol 143 and HDL 37.  Because of increased cardiac murmur on auscultation he underwent a follow-up echo Doppler study in June 25, 2018.  This continued to show excellent ejection fraction at 60 to 65% with grade 2 diastolic dysfunction.  There was mild aortic valve stenosis with a mean gradient of 14, peak instantaneous gradient of 29, and calculated valve area of 1.6 8 cm.  When I saw him last on July 07, 2018 he was maintaining sinus rhythm and was without chest pain PND orthopnea although he still noted mild shortness of breath when bending over.  He was playing golf several days per week.    Since I last saw him in October  2019  he apparently has had recurrent issues with atrial fibrillation.  He has been seen by Kerin Ransom, PA, Almyra Deforest, [place],  [name], NP and Coletta Memos, NP.  Apparently he has been in longstanding persistent atrial fibrillation for well over a year.  He has been on chronic anticoagulation with Eliquis.  When I last saw him in April 2021 after not having seen him in 18 months he denied any recurrent anginal symptomatology.  He was continuing to play golf 2 days/week and had experienced some shortness of breath with uphill walking.  He was walking his dog daily.  Since he had not had an echo in over 2 years I recommended a follow-up echo Doppler study which was done on 02/14/2020.  This revealed an EF of 60 to 65%.  He had mildly increased pulmonary artery systolic pressure 87.8 mm.  There was evidence for mild aortic valve stenosis with a mean gradient of 13.8 and peak gradient of 22.8.  Pulmonary artery systolic pressure was mildly increased at 36.5.  He had normal atrial dimensions.  Unfortunately, recently his wife broke her hip and required hip surgery, rehab therapy.  He has persistent atrial fibrillation and has been on Eliquis 5 mg twice a day.  He continues to be on amlodipine 2.5 mg, irbesartan 150 mg and furosemide 20 mg daily.  He is on atorvastatin 40 mg for hyperlipidemia.  He is on Metformin 500 mg daily.  He presents for evaluation.     Past Medical History:  Diagnosis Date  . CAD (coronary artery disease), severe needing CABG 01/19/2013  . Chest pain    2D ECHO, 06/16/2012 - EF 50-55%, borderline low left ventricular systolic function  . Coronary artery disease   . Dyspnea on exertion    LEXISCAN, 06/17/2012 - fixed inferior bowel artifact and basal septal defect, probably related to IVCD/incomplete LBBB, no reversible ischemia, post-stress EF 66%  . History of AAA (abdominal aortic aneurysm) repair    ABDOMINAL AORTIC DOPPLER, 06/16/2013 - normal  . HTN (hypertension) 01/19/2013    . Hyperlipidemia LDL goal < 70 01/19/2013  . Hypertension   . S/P CABG x 3 01/19/2013    Past Surgical History:  Procedure Laterality Date  . CARDIAC CATHETERIZATION  01/15/2013   Severe coronary obstructive disease with 60-70% ostial left main and 90-85% distal LAD stenosis and mild-moderate stenoses in RCA, recommended for CABG  . CARDIOVERSION N/A 11/12/2013  Procedure: CARDIOVERSION;  Surgeon: Troy Sine, MD;  Location: [facility] ENDOSCOPY;  Service: Cardiovascular;  Laterality: N/A;  11:16 Lido 47m, Propofol 1299mIV 11:19 synch 100 joules conversion conversion to NSR....  . CARDIOVERSION N/A 05/13/2017   Procedure: CARDIOVERSION;  Surgeon: NaAcie FredricksonhWonda ChengMD;  [facility];  Service: Cardiovascular;  Laterality: N/A;  . CORONARY ARTERY BYPASS GRAFT N/A 01/15/2013   Procedure: Coronary Artery Bypass Graft times three using Right Greater Saphenous Vein Graft harvested endoscopically and Left Internal Mammary Artery and;  Surgeon: BrGaye PollackMD;  [facility];  Service: Open Heart Surgery;  Laterality: N/A;  . INTRAOPERATIVE TRANSESOPHAGEAL ECHOCARDIOGRAM  01/15/2013   Procedure: INTRAOPERATIVE TRANSESOPHAGEAL ECHOCARDIOGRAM;  Surgeon: BrGaye PollackMD;  [facility];  Service: Open Heart Surgery;;  . LEFT HEART CATHETERIZATION WITH CORONARY ANGIOGRAM N/A 01/15/2013   Procedure: LEFT HEART CATHETERIZATION WITH CORONARY ANGIOGRAM;  Surgeon: ThTroy SineMD;  [facility];  Service: Cardiovascular;  Laterality: N/A;  . VASCULAR SURGERY      Allergies  Allergen Reactions  . Penicillins Rash    Has patient had a PCN reaction causing immediate rash, facial/tongue/throat swelling, SOB or lightheadedness with hypotension: no Has patient had a PCN reaction causing severe rash involving mucus membranes or skin necrosis: no Has patient had a PCN reaction that required hospitalization: no Has patient had a PCN reaction occurring within the last 10 years: yes If all  of the above answers are "NO", then may proceed with Cephalosporin use.     Current Outpatient Medications  Medication Sig Dispense Refill  . amoxicillin (AMOXIL) 500 MG capsule Take by mouth.    . Ascorbic Acid (VITAMIN C) 1000 MG tablet Take 1,000 mg by mouth daily. 1 Tablet Daily    . atorvastatin (LIPITOR) 40 MG tablet TAKE 1 TABLET BY MOUTH EVERY DAY 90 tablet 0  . cholecalciferol (VITAMIN D3) 25 MCG (1000 UNIT) tablet Take 1,000 Units by mouth daily. 1 Tablet Daily    . ELIQUIS 5 MG TABS tablet TAKE 1 TABLET BY MOUTH TWICE DAILY 60 tablet 6  . furosemide (LASIX) 20 MG tablet TAKE 1 TABLET(20 MG) BY MOUTH DAILY 90 tablet 3  . irbesartan (AVAPRO) 150 MG tablet TAKE 1 TABLET(150 MG) BY MOUTH DAILY 90 tablet 2  . latanoprost (XALATAN) 0.005 % ophthalmic solution 1 drop at bedtime.    . metFORMIN (GLUCOPHAGE) 1000 MG tablet Take 1,000 mg by mouth daily.    . Multiple Vitamin (MULTIVITAMIN WITH MINERALS) TABS Take 1 tablet by mouth daily.    . Marland KitchenmLODipine (NORVASC) 2.5 MG tablet Take 1 tablet (2.5 mg total) by mouth daily. 90 tablet 3   No current facility-administered medications for this visit.    Socially he is re-married. His first wife of 42 years passed away. He previously had smoked cigars prior to his bypass surgery but has not had any tobacco use since.  He continues to play golf at least 2 times per week.  He does a lot of leg exercise with cardiac rehabilitation.  ROS General: Negative; No fevers, chills, or night sweats;  HEENT: Negative; No changes in vision or hearing, sinus congestion, difficulty swallowing Pulmonary: URI symptoms during the winter; No cough, wheezing, shortness of breath, hemoptysis Cardiovascular: Mild shortness of breath with uphill walking; see history of present illness GI: Negative; No nausea, vomiting, diarrhea, or abdominal pain GU: Negative; No dysuria, hematuria, or difficulty voiding Musculoskeletal: Negative; no myalgias, joint pain, or  weakness Hematologic/Oncology: Negative;  no easy bruising, bleeding Endocrine: Negative; no heat/cold intolerance; no diabetes Neuro: Negative; no changes in balance, headaches Skin: Removal of small left forearm melanoma. Psychiatric: Negative; No behavioral problems, depression Sleep: Negative; No snoring, daytime sleepiness, hypersomnolence, bruxism, restless legs, hypnogognic hallucinations, no cataplexy Other comprehensive 14 point system review is negative.   PE BP 130/85   Pulse (!) 54   Ht 6' 1.5" (1.867 m)   Wt 232 lb (105.2 kg)   SpO2 96%   BMI 30.19 kg/m    Repeat blood pressure by me was 140/68  Wt Readings from Last 3 Encounters:  07/10/20 232 lb (105.2 kg)  01/07/20 238 lb 3.2 oz (108 kg)  05/13/19 238 lb 6.4 oz (108.1 kg)    General: Alert, oriented, no distress.  Skin: normal turgor, no rashes, warm and dry HEENT: Normocephalic, atraumatic. Pupils equal round and reactive to light; sclera anicteric; extraocular muscles intact;  Nose without nasal septal hypertrophy Mouth/Parynx benign; Mallinpatti scale 3 Neck: No JVD, no carotid bruits; normal carotid upstroke Lungs: clear to ausculatation and percussion; no wheezing or rales Chest wall: without tenderness to palpitation Heart: PMI not displaced, irregularly irregular with a controlled ventricular response, s1 s2 normal, 1/6 systolic murmur, no diastolic murmur, no rubs, gallops, thrills, or heaves Abdomen: soft, nontender; no hepatosplenomehaly, BS+; abdominal aorta nontender and not dilated by palpation. Back: no CVA tenderness Pulses 2+ Musculoskeletal: full range of motion, normal strength, no joint deformities Extremities: no clubbing cyanosis or edema, Homan's sign negative  Neurologic: grossly nonfocal; Cranial nerves grossly wnl Psychologic: Normal mood and affect   ECG (independently read by me): Atrial fibrillation at 54; PRWP; QTc 428 bmsec  April 2021 ECG (independently read by me): Atrial  fibrillation with an average ventricular rate of 53 bpm with a range of in the 60s to 40s.  One isolated PVC.  Nonspecific interventricular block.  July 07, 2020 ECG (independently read by me): Sinus bradycardia with first-degree AV block.  PR interval 392 ms.  Incomplete left bundle branch block.  May 29, 2018 ECG (independently read by me): Sinus rhythm with first-degree AV block at 72 bpm with a PR interval of 312 ms.  Occasional PACs.  February 2019 ECG (independently read by me): Sinus rhythm at 63 bpm.  First-degree AV block with a PR interval at 344 ms.  PAC.  Nonspecific interventricular conduction delay.  October 2018 ECG (independently read by me): Sinus rhythm with first-degree AV block with a PR interval of 314 ms.  Nonspecific interventricular block.  QTc interval 431  January 2018 ECG (independently read by me): Normal sinus rhythm with first-degree AV block.  PR interval 300 ms.  Nonspecific interventricular conduction delay.  October 2017 ECG (independently read by me): Normal sinus rhythm at 66 bpm.  First-degree block with a PR interval at 290 ms.  Nonspecific interventricular block.  03/02/2015 ECG (independently read by me): Sinus rhythm at 75 bpm.  Occasional unifocal PVCs.  Nonspecific interventricular block.  Prior October 2015 ECG (independently read by me): Normal sinus rhythm at 67 beats per minute.  First degree A-V block with PR interval at 290 ms.  QTc interval 439 ms.  No ST segment changes.  Prior June 2015 ECG (independently read by me): Normal sinus rhythm at 68 beats per minute with first degree AV block with a PR interval at 284 ms.  QTc interval 435 ms.  No significant ST-T changes.  T-wave inversion in aVL  ECG today (independently read by me):  Normal  sinus rhythm at 71 beats per minute. First create a block with PR interval at 240 ms. QTc interval normal at 4-5 ms.  Prior 10/27/13 ECG (independently read by me): Atrial flutter with variable rate but  predominantly 4-1 with occasional 3-1 block; average heart rate 70 beats per minute  Prior ECG from 06/04/2013: Sinus rhythm with first-degree AV block. Nonspecific interventricular block  LABS: BMP Latest Ref Rng & Units 01/10/2020 05/10/2019 05/26/2018  Glucose 65 - 99 mg/dL 99 107(H) 103(H)  BUN 8 - 27 mg/dL _0 Creatinine 0.76 - 1.27 mg/dL 0.90 0.96 0.96  BUN/Creat Ratio 10 - _1 Sodium 134 - 144 mmol/L 144 141 140  Potassium 3.5 - 5.2 mmol/L 4.7 4.8 4.5  Chloride 96 - 106 mmol/L 105 104 105  CO2 20 - 29 mmol/L _2 Calcium 8.6 - 10.2 mg/dL 9.6 8.9 9.6   Hepatic Function Latest Ref Rng & Units 01/10/2020 05/10/2019 05/26/2018  Total Protein 6.0 - 8.5 g/dL 6.9 6.6 6.7  Albumin 3.6 - 4.6 g/dL 4.3 4.4 4.4  AST 0 - 40 IU/L _3 ALT 0 - 44 IU/L _4 Alk Phosphatase 39 - 117 IU/L 78 69 66  Total Bilirubin 0.0 - 1.2 mg/dL 1.1 0.8 0.8  Bilirubin, Direct 0.00 - 0.40 mg/dL - - -   CBC Latest Ref Rng & Units 01/10/2020 05/10/2019 05/26/2018  WBC 3.4 - 10.8 x10E3/uL 7.4 8.0 7.9  Hemoglobin 13.0 - 17.7 g/dL 13.8 12.1(L) 14.7  Hematocrit 37.5 - 51.0 % 41.6 38.0 43.6  Platelets 150 - 450 x10E3/uL 228 258 267   Lab Results  Component Value Date   MCV 87 01/10/2020   MCV 83 05/10/2019   MCV 90 05/26/2018   Lab Results  Component Value Date   TSH 2.500 01/10/2020   Lipid Panel     Component Value Date/Time   CHOL 125 01/10/2020 1035   CHOL 111 03/01/2015 1033   TRIG 113 01/10/2020 1035   TRIG 90 03/01/2015 1033   HDL 36 (L) 01/10/2020 1035   HDL 41 03/01/2015 1033   CHOLHDL 3.5 01/10/2020 1035   CHOLHDL 4.1 07/19/2016 0949   VLDL 24 07/19/2016 0949   LDLCALC 68 01/10/2020 1035   LDLCALC 52 03/01/2015 1033     RADIOLOGY: No results found.  IMPRESSION:  1. Longstanding persistent atrial fibrillation ([place])   2. CAD in native artery   3. Hx of CABG   4. S/P AAA repair   5. Essential hypertension   6. Mild aortic stenosis   7. Anticoagulation  adequate   8. Type 2 diabetes mellitus with other circulatory complication, without long-term current use of insulin [facility])     ASSESSMENT AND PLAN: Derek Wells is an active 86 year old white male who is status post emergent CABG revascularization surgery after cardiac catheterization on 01/15/2013 demonstrated severe life threatening coronary anatomy.  He  developed atrial flutter in January 2015 and was started on anticoagulation with Xarelto and underwent cardioversion.  He developed recurrent episodes of atrial fibrillation in 2018 and had maintained sinus rhythm since his repeat cardioversion on 05/13/2017 but developed recurrent atrial flutter 1 year later in May 13, 2018 was found to have a flutter with controlled rate.  When seen him in October 2019 he was maintaining sinus rhythm.  However since that time he has been seen on multiple occasions by numerous providers and he is now felt to have longstanding persistent atrial  fibrillation.  He has been maintained on Eliquis for anticoagulation. An echo Doppler study is done in 2019 which showed normal EF at 60 to 65% with a mean aortic gradient of 14 consistent with mild aortic stenosis.  He had grade 2 diastolic dysfunction.  I reviewed his 2-year follow-up echo Doppler data from his echo in May 2021 which continue to show normal systolic function.  Aortic stenosis remains mild with a mean gradient of 13.8 and peak gradient of 22.8 mmHg.  There was mild pulmonary hypertension with a PA systolic pressure of 01.0.  He continues to be on Eliquis and denies bleeding.  ECG today confirms his persistent longstanding atrial fibrillation with a controlled ventricular rate.  He has been on furosemide 20 mg daily, irbesartan 150 mg daily and amlodipine low-dose at 2.5 mg for blood pressure control.  He will need to monitor blood pressure and if this continues to be slightly elevated further titration of medication will be necessary.  He is now on low-dose Metformin  which was reduced to 2 500 mg with his most recent hemoglobin A1c in April 2021 at 5.8.  He continues to be on atorvastatin 40 mg daily.  LDL cholesterol is 68.  I have recommended in May 2022 he undergo a 1 year follow-up echo Doppler study.  I will also schedule him for a follow-up abdominal aortic ultrasound in light of his prior aneurysm surgery.  I will see him in the office for follow-up evaluation.  Troy Sine, MD, [facility]  07/12/2020 12:01 PM

## 2020-07-12 ENCOUNTER — Encounter: Payer: Self-pay | Admitting: Cardiovascular Disease

## 2020-07-18 ENCOUNTER — Other Ambulatory Visit (HOSPITAL_COMMUNITY): Payer: Self-pay | Admitting: Nurse Practitioner

## 2020-08-08 DIAGNOSIS — H40023 Open angle with borderline findings, high risk, bilateral: Secondary | ICD-10-CM | POA: Diagnosis not present

## 2020-09-06 DIAGNOSIS — I4892 Unspecified atrial flutter: Secondary | ICD-10-CM | POA: Diagnosis not present

## 2020-09-06 DIAGNOSIS — E785 Hyperlipidemia, unspecified: Secondary | ICD-10-CM | POA: Diagnosis not present

## 2020-09-06 DIAGNOSIS — I25119 Atherosclerotic heart disease of native coronary artery with unspecified angina pectoris: Secondary | ICD-10-CM | POA: Diagnosis not present

## 2020-09-06 DIAGNOSIS — I10 Essential (primary) hypertension: Secondary | ICD-10-CM | POA: Diagnosis not present

## 2020-09-06 DIAGNOSIS — Z0000 Encounter for general adult medical examination without abnormal findings: Secondary | ICD-10-CM | POA: Diagnosis not present

## 2020-09-06 DIAGNOSIS — Z1389 Encounter for screening for other disorder: Secondary | ICD-10-CM | POA: Diagnosis not present

## 2020-09-16 ENCOUNTER — Other Ambulatory Visit: Payer: Self-pay | Admitting: Cardiology

## 2020-12-15 ENCOUNTER — Other Ambulatory Visit: Payer: Self-pay | Admitting: Cardiology

## 2020-12-15 ENCOUNTER — Other Ambulatory Visit: Payer: Self-pay | Admitting: Cardiovascular Disease

## 2020-12-26 ENCOUNTER — Other Ambulatory Visit: Payer: Self-pay | Admitting: Cardiovascular Disease

## 2021-01-05 ENCOUNTER — Ambulatory Visit (HOSPITAL_COMMUNITY)
Admission: RE | Admit: 2021-01-05 | Discharge: 2021-01-05 | Disposition: A | Discharge status: Home / Self Care | Payer: Medicare Other | Source: Ambulatory Visit | Attending: Cardiovascular Disease | Admitting: Cardiovascular Disease

## 2021-01-05 ENCOUNTER — Other Ambulatory Visit (HOSPITAL_COMMUNITY): Payer: Self-pay | Admitting: Cardiovascular Disease

## 2021-01-05 ENCOUNTER — Other Ambulatory Visit: Payer: Self-pay

## 2021-01-05 DIAGNOSIS — I2510 Atherosclerotic heart disease of native coronary artery without angina pectoris: Secondary | ICD-10-CM

## 2021-01-05 DIAGNOSIS — Z95828 Presence of other vascular implants and grafts: Secondary | ICD-10-CM | POA: Diagnosis not present

## 2021-01-05 DIAGNOSIS — I350 Nonrheumatic aortic (valve) stenosis: Secondary | ICD-10-CM | POA: Diagnosis not present

## 2021-01-05 DIAGNOSIS — Z8679 Personal history of other diseases of the circulatory system: Secondary | ICD-10-CM | POA: Diagnosis not present

## 2021-01-05 DIAGNOSIS — Z9889 Other specified postprocedural states: Secondary | ICD-10-CM

## 2021-01-08 ENCOUNTER — Other Ambulatory Visit: Payer: Self-pay

## 2021-01-08 ENCOUNTER — Ambulatory Visit (HOSPITAL_COMMUNITY): Payer: Medicare Other | Attending: Cardiology

## 2021-01-08 DIAGNOSIS — I350 Nonrheumatic aortic (valve) stenosis: Secondary | ICD-10-CM | POA: Diagnosis not present

## 2021-01-08 DIAGNOSIS — I2510 Atherosclerotic heart disease of native coronary artery without angina pectoris: Secondary | ICD-10-CM | POA: Insufficient documentation

## 2021-01-08 DIAGNOSIS — Z8679 Personal history of other diseases of the circulatory system: Secondary | ICD-10-CM | POA: Diagnosis not present

## 2021-01-08 DIAGNOSIS — Z9889 Other specified postprocedural states: Secondary | ICD-10-CM | POA: Insufficient documentation

## 2021-01-08 LAB — ECHOCARDIOGRAM COMPLETE
AR max vel: 1.79 cm2
AV Area VTI: 1.65 cm2
AV Area mean vel: 1.62 cm2
AV Mean grad: 14 mmHg
AV Peak grad: 24.2 mmHg
Ao pk vel: 2.46 m/s
Area-P 1/2: 3.23 cm2
S' Lateral: 2.7 cm

## 2021-01-22 ENCOUNTER — Telehealth: Payer: Self-pay

## 2021-01-22 DIAGNOSIS — R001 Bradycardia, unspecified: Secondary | ICD-10-CM

## 2021-01-22 NOTE — Telephone Encounter (Signed)
Spoke to pt, he report since Friday he's had no energy and felt wiped out. He state yesterday his HR was 42 and today 46. He state his HR normally run in the high 40's and 50's but not think he can continue to feel this way. Today he state he feel the best he has in the past 3 days but questioning if Dr. Tresa Endo would like to see him or has any recommendations.   Will forward to MD for recommendations.

## 2021-01-23 NOTE — Telephone Encounter (Signed)
Informed pt that Dr. Tresa Endo was in clinic yesterday and will respond once he review message. Pt verbalized understanding.

## 2021-01-23 NOTE — Telephone Encounter (Signed)
Patient was calling for update from converstation on yesterday

## 2021-01-24 ENCOUNTER — Ambulatory Visit (INDEPENDENT_AMBULATORY_CARE_PROVIDER_SITE_OTHER): Payer: Medicare Other

## 2021-01-24 DIAGNOSIS — R001 Bradycardia, unspecified: Secondary | ICD-10-CM

## 2021-01-24 NOTE — Telephone Encounter (Signed)
The patient has history of atrial fibrillation and currently is not on any rate control medications.  I last saw him in October 2021 heart rate and ECG was 4 bpm.  Will recommend follow-up evaluation.  He may also benefit possibly from a 2-week event monitor to see if he is having significant bradycardia or pauses.  He may have chronotropic incompetence but less he is having significant pauses present there will be no need for pacemaker.

## 2021-01-24 NOTE — Telephone Encounter (Signed)
Pt made aware of MD's recommendations and verbalized understanding. Orders placed for monitor and appointment scheduled for 5/12 at 12:45 pm for further evaluations.

## 2021-01-29 DIAGNOSIS — R001 Bradycardia, unspecified: Secondary | ICD-10-CM

## 2021-01-31 NOTE — Progress Notes (Signed)
Cardiology Office Note:    Date:  02/01/2021   ID:  Derek Wells, DOB [birth year], MRN [id number]  PCP:  Marden Noble, MD  Cardiologist:  Nicki Guadalajara, MD  Electrophysiologist:  None   Referring MD: Marden Noble, MD   Chief Complaint: low heart rate and no energy  History of Present Illness:    Derek Wells is a 87 y.o. male with a history of CAD s/p CABG x3 in 2014, persistent atrial fibrillation/flutter on Eliquis, AAA s/p repair in 12/2008, hypertension, hyperlipidemia, and type 2 diabetes who is followed by Dr. Tresa Endo and presents today for evaluation of bradycardia.   Patient was has a significant cardiac history. He has known CAD and was found to have life threatenting CAD with severe distal left main/ostial LAD stenosis on cath on 01/15/2013. He underwent CABG x3 (LIMA to LAD, SVG to OM, SVG to PAD) on same day as cath with Dr. Laneta Simmers. Last ischemic evaluation was a Myoview in 2017 whih was low risk with no evidence of ischemia. He also has a history of atrial flutter/fibrillation. He was first noted to be in atrial flutter in 2015 during cardiac rehab and ultimately underwent DCCV in 10/2013. He then developed atrial fibrillation in 2018 and had multiple evaluations in the atrial fibrillation. Underwent another DCCV in 04/2017 with restoration of sinus rhythm. However, he has since gone back into atrial fibrillation and now is considered to have persistent atrial fibrillation. Monitor in 11/2018 showed atrial fibrillation with slow ventricular response (rate ranging from 33 to 97 bpm with average rate of 53 bpm) and 100% atrial fibrillation burden. He has been seen in the Atrial Fibrillation Clinic in the past and there have been discussion of trying Tikosyn but patient wanted to hold off for the time being due to the pandemic.   Patient was last seen by Dr. Tresa Endo in 06/2020 at which time he was doing relatively well from a cardiac standpoint. Recent Echo in 12/2020 showed LVEF of 60% with  normal wall motion, mild biatrial enlargement, and mild AS. Updated abdominal ultrasound in 12/2020 showed no evidence of abdominal aortic aneurysm.  Patient called our office on 01/22/2021 with reports of bradycardia with rates in the 40s as well as fatigue. 2 week Zio monitor was ordered and this visit was scheduled for further evaluation.   Patient here alone. Patient described the above event. He states the night before he was eating a lot of sweats. He then woke up the next morning with heart of 42 bpm and he had absolutely no energy. This lasted for about 2.5 days. And then severe fatigue resolved. He now feel back to normal. Heart rate 55 bpm today which is normal for patient. He states heart rate normally in the mid 50s (will get up into the 60s with activity). He denies any lightheadedness, dizziness, or near syncope/syncope when his heart rate was in the 40s. No chest pain. He has chronic dyspnea with exertion but this is stable. No orthopnea, PND, or lower extremity edema. He does mention that when he walks about a 1/2 of a mile his left leg feels "numbish" and "tired" from about his knee down. He has good pulses and denies any discoloration of his feet.  Past Medical History:  Diagnosis Date  . CAD (coronary artery disease), severe needing CABG 01/19/2013  . Chest pain    2D ECHO, 06/16/2012 - EF 50-55%, borderline low left ventricular systolic function  . Coronary artery disease   . Dyspnea  on exertion    LEXISCAN, 06/17/2012 - fixed inferior bowel artifact and basal septal defect, probably related to IVCD/incomplete LBBB, no reversible ischemia, post-stress EF 66%  . History of AAA (abdominal aortic aneurysm) repair    ABDOMINAL AORTIC DOPPLER, 06/16/2013 - normal  . HTN (hypertension) 01/19/2013  . Hyperlipidemia LDL goal < 70 01/19/2013  . Hypertension   . S/P CABG x 3 01/19/2013    Past Surgical History:  Procedure Laterality Date  . CARDIAC CATHETERIZATION  01/15/2013   Severe  coronary obstructive disease with 60-70% ostial left main and 90-85% distal LAD stenosis and mild-moderate stenoses in RCA, recommended for CABG  . CARDIOVERSION N/A 11/12/2013   Procedure: CARDIOVERSION;  Surgeon: Lennette Biharihomas A Kelly, MD;  [facility];  Service: Cardiovascular;  Laterality: N/A;  11:16 Lido 60mg , Propofol 120mg ,IV 11:19 synch 100 joules conversion conversion to NSR....  . CARDIOVERSION N/A 05/13/2017   Procedure: CARDIOVERSION;  Surgeon: Elease HashimotoNahser, Deloris PingPhilip J, MD;  [facility];  Service: Cardiovascular;  Laterality: N/A;  . CORONARY ARTERY BYPASS GRAFT N/A 01/15/2013   Procedure: Coronary Artery Bypass Graft times three using Right Greater Saphenous Vein Graft harvested endoscopically and Left Internal Mammary Artery and;  Surgeon: Alleen BorneBryan K Bartle, MD;  Location: [facility] OR;  Service: Open Heart Surgery;  Laterality: N/A;  . INTRAOPERATIVE TRANSESOPHAGEAL ECHOCARDIOGRAM  01/15/2013   Procedure: INTRAOPERATIVE TRANSESOPHAGEAL ECHOCARDIOGRAM;  Surgeon: Alleen BorneBryan K Bartle, MD;  Location: [facility] OR;  Service: Open Heart Surgery;;  . LEFT HEART CATHETERIZATION WITH CORONARY ANGIOGRAM N/A 01/15/2013   Procedure: LEFT HEART CATHETERIZATION WITH CORONARY ANGIOGRAM;  Surgeon: Lennette Biharihomas A Kelly, MD;  [facility];  Service: Cardiovascular;  Laterality: N/A;  . VASCULAR SURGERY      Current Medications: Current Meds  Medication Sig  . Ascorbic Acid (VITAMIN C) 1000 MG tablet Take 1,000 mg by mouth daily. 1 Tablet Daily  . atorvastatin (LIPITOR) 40 MG tablet TAKE 1 TABLET BY MOUTH EVERY DAY  . cholecalciferol (VITAMIN D3) 25 MCG (1000 UNIT) tablet Take 1,000 Units by mouth daily. 1 Tablet Daily  . ELIQUIS 5 MG TABS tablet TAKE 1 TABLET BY MOUTH TWICE DAILY  . furosemide (LASIX) 20 MG tablet TAKE 1 TABLET(20 MG) BY MOUTH DAILY  . irbesartan (AVAPRO) 150 MG tablet TAKE 1 TABLET(150 MG) BY MOUTH DAILY  . latanoprost (XALATAN) 0.005 % ophthalmic solution 1 drop at bedtime.  . metFORMIN  (GLUCOPHAGE) 1000 MG tablet Take 1,000 mg by mouth daily.  . Multiple Vitamin (MULTIVITAMIN WITH MINERALS) TABS Take 1 tablet by mouth daily.  . [DISCONTINUED] amLODipine (NORVASC) 2.5 MG tablet TAKE 1 TABLET(2.5 MG) BY MOUTH DAILY  . [DISCONTINUED] amoxicillin (AMOXIL) 500 MG capsule Take by mouth.     Allergies:   Penicillins   Social History   Socioeconomic History  . Marital status: Married    Spouse name: Not on file  . Number of children: Not on file  . Years of education: Not on file  . Highest education level: Not on file  Occupational History  . Not on file  Tobacco Use  . Smoking status: Former Smoker    Types: Cigars    Quit date: 01/16/2013    Years since quitting: 8.0  . Smokeless tobacco: Never Used  Substance and Sexual Activity  . Alcohol use: Yes    Alcohol/week: 14.0 standard drinks    Types: 14 Standard drinks or equivalent per week  . Drug use: No  . Sexual activity: Not on file  Other Topics Concern  .  Not on file  Social History Narrative  . Not on file   Social Determinants of Health   Financial Resource Strain: Not on file  Food Insecurity: Not on file  Transportation Needs: Not on file  Physical Activity: Not on file  Stress: Not on file  Social Connections: Not on file     Family History: The patient's family history includes Heart disease in his brother, father, and mother; Hyperlipidemia in his mother; Hypertension in his father.  ROS:   Please see the history of present illness.     EKGs/Labs/Other Studies Reviewed:    The following studies were reviewed today:  Myoview 07/23/2016:  The left ventricular ejection fraction is normal (55-65%).  Nuclear stress EF: 62%. No wall motion abnormalities  There was no ST segment deviation noted during stress. Rare PVC  This is a low risk study. No perfusion defects, no ischemia _______________  Vascular Ultrasound AAA Duplex 41/15/2022: Summary: Abdominal Aorta: Very limited exam  due to overlying bowel. Limited segment  visualized showed no evidence of aneurysm.  Moderate atherosclerosis noted throughout iliac arteries.  IVC/Iliac: IVC not seen due to overlying bowel gas.    *See table(s) above for measurements and observations.  Suggest follow up study in 12 months.   _______________  Echocardiogram 01/08/2021: Impressions: 1. Left ventricular ejection fraction, by estimation, is 55 to 60%. The  left ventricle has normal function. The left ventricle has no regional  wall motion abnormalities. Left ventricular diastolic parameters are  indeterminate.  2. Right ventricular systolic function is normal. The right ventricular  size is mildly enlarged. There is normal pulmonary artery systolic  pressure. The estimated right ventricular systolic pressure is 26.6 mmHg.  3. Left atrial size was mildly dilated.  4. Right atrial size was mildly dilated.  5. The mitral valve is normal in structure. Trivial mitral valve  regurgitation. No evidence of mitral stenosis.  6. The aortic valve is tricuspid. Aortic valve regurgitation is not  visualized. Mild aortic valve stenosis (though looks worse visually).  Aortic valve area, by VTI measures 1.65 cm. Aortic valve mean gradient  measures 14.0 mmHg.  7. The inferior vena cava is normal in size with greater than 50%  respiratory variability, suggesting right atrial pressure of 3 mmHg.   EKG:  EKG ordered today. EKG personally reviewed and demonstrates atrial fibrillation, rate 55 bpm, with nonspecific intraventricular block but no acute ST/ T changes. Left axis deviation. QTc 440 ms.  Recent Labs: No results found for requested labs within last 8760 hours.  Recent Lipid Panel    Component Value Date/Time   CHOL 125 01/10/2020 1035   CHOL 111 03/01/2015 1033   TRIG 113 01/10/2020 1035   TRIG 90 03/01/2015 1033   HDL 36 (L) 01/10/2020 1035   HDL 41 03/01/2015 1033   CHOLHDL 3.5 01/10/2020 1035   CHOLHDL 4.1  07/19/2016 0949   VLDL 24 07/19/2016 0949   LDLCALC 68 01/10/2020 1035   LDLCALC 52 03/01/2015 1033    Physical Exam:    Vital Signs: BP (!) 160/60   Pulse (!) 55   Ht 6' 1.5" (1.867 m)   Wt 232 lb 12.8 oz (105.6 kg)   BMI 30.30 kg/m     Wt Readings from Last 3 Encounters:  02/01/21 232 lb 12.8 oz (105.6 kg)  07/10/20 232 lb (105.2 kg)  01/07/20 238 lb 3.2 oz (108 kg)     General: 87 y.o. male in no acute distress. HEENT: Normocephalic and atraumatic.  Sclera clear.  Neck: Supple. No JVD. Heart: Mildly bradycardic with irregularly irregular rhythm. III/VI systolic murmur. No murmurs, gallops, or rubs. Radial and distal pedal pulses 2+ and equal bilaterally. Lungs: No increased work of breathing. Clear to ausculation bilaterally. No wheezes, rhonchi, or rales.  Abdomen: Soft, non-distended, and non-tender to palpation.  Extremities: No lower extremity edema.    Skin: Warm and dry. Neuro: Alert and oriented x3. No focal deficits. Psych: Normal affect. Responds appropriately.   Assessment:    1. Bradycardia   2. Longstanding persistent atrial fibrillation (HCC)   3. Coronary artery disease involving native coronary artery of native heart without angina pectoris   4. S/P AAA repair   5. Aortic valve stenosis, etiology of cardiac valve disease unspecified   6. Primary hypertension   7. Hyperlipidemia, unspecified hyperlipidemia type   8. Type 2 diabetes mellitus with complication, without long-term current use of insulin (HCC)     Plan:    Bradycardia Persistent Atrial Fibrillation with SVR - Patient had an episode of bradycardia a couple of weeks ago with rate in the 40's with associated fatigue. However, heart rates now back to baseline and patient feeling fine. - EKG shows atrial fibrillation with ventricular rate of 55 bpm - Recent Echo on 01/12/2021 showed normal LV function.  - Will check TSH, CMET, and Magnesium. - Not on any AV nodal agents.  - Continue chronic  anticoagulation with Eliquis 5mg  twice daily.  - Finish wearing 2 week Zio monitor. Depending on results, may need to have patient get back in with EP/A.Fib Clinic.  CAD - History of CABG in 2014.  - No angina.  - No aspirin due to need for DOAC. - Continue high-intensity statin.  AAA s/p Repair in 2010 - Recent ultrasound in 12/2020 was limited but showed no evidence of aneurysm but did note moderate atherosclerosis throughout iliac arteries. - Continue good control of BP.  Aortic Stenosis - Mild AS noted on Echo in 12/2020. Mean gradient 14.0 mmHg. No significant change compared to Echos dating back to 2018.  - Given age, will defer need for repeat evaluation to primary Cardiologist.   Hypertension  - BP elevat - Continue Irbesartan 150mg  daily. - Increase Amlodipine to 5mg  daily. - Also on Lasix 40mg  daily. Patient does not remember why he is on this. He is unsure if this is for lower extremity edema or not. Will continue for now.  - Asked patient to keep BP/HR log for 2 weeks and then send this to 2019.  Hyperlipidemia - Last lipid panel from 12/2019: Total Cholesterol 125, Triglycerides 113, HDL 36, LDL 68.  - LDL goal <70 given CAD. - Continue Lipitor 40mg  daily. - Patient is not fasting today so will have him come back for fasting lipid panel.  Type 2 Diabetes Mellitus - On Metformin. - Management per PCP.    Left Numbness/Tightness - Patient reports left leg numbness/tightness when walking that improves with rest. Only the left leg. He has good pedal pulses. Denis any discoloration. - Recent abdominal ultrasound showed atherosclerosis throughout iliac arteries.  - Offered lower extremity arterial dopplers/ABIs but patient would like to hold off for now.  Disposition: Follow up in 4 months with Dr. .   Medication Adjustments/Labs and Tests Ordered: Current medicines are reviewed at length with the patient today.  Concerns regarding medicines are outlined above.   Orders Placed This Encounter  Procedures  . TSH  . Comprehensive metabolic panel  . Magnesium  . Lipid  panel  . EKG 12-Lead   Meds ordered this encounter  Medications  . amLODipine (NORVASC) 5 MG tablet    Sig: Take 1 tablet (5 mg total) by mouth daily.    Dispense:  90 tablet    Refill:  1    Patient Instructions  Medication Instructions:  Increase Amlodipine to 5 mg daily   *If you need a refill on your cardiac medications before your next appointment, please call your pharmacy*   Lab Work: TSH, CMET, MAG today  LIPID (come back fasting, nothing to eat or drink)  If you have labs (blood work) drawn today and your tests are completely normal, you will receive your results only by: Marland Kitchen MyChart Message (if you have MyChart) OR . A paper copy in the mail If you have any lab test that is abnormal or we need to change your treatment, we will call you to review the results.   Follow-Up: At [facility], you and your health needs are our priority.  As part of our continuing mission to provide you with exceptional heart care, we have created designated Provider Care Teams.  These Care Teams include your primary Cardiologist (physician) and Advanced Practice Providers (APPs -  Physician Assistants and Nurse Practitioners) who all work together to provide you with the care you need, when you need it.  We recommend signing up for the patient portal called "MyChart".  Sign up information is provided on this After Visit Summary.  MyChart is used to connect with patients for Virtual Visits (Telemedicine).  Patients are able to view lab/test results, encounter notes, upcoming appointments, etc.  Non-urgent messages can be sent to your provider as well.   To learn more about what you can do with MyChart, go to [email or web address].    Your next appointment:   4 month(s)  The format for your next appointment:   In Person  Provider:   You may see Nicki Guadalajara, MD or one of the  following Advanced Practice Providers on your designated Care Team:    Azalee Course, PA-C  Micah Flesher, [place] or   Judy Pimple, [place]    Other Instructions Take BP/HR for 2 weeks- please call us with an update [phone]      Signed, Corrin Parker, PA-C  02/01/2021 5:23 PM    [facility]

## 2021-02-01 ENCOUNTER — Ambulatory Visit: Payer: Medicare Other | Admitting: Student

## 2021-02-01 ENCOUNTER — Encounter: Payer: Self-pay | Admitting: Student

## 2021-02-01 ENCOUNTER — Other Ambulatory Visit: Payer: Self-pay

## 2021-02-01 VITALS — BP 160/60 | HR 55 | Ht 73.5 in | Wt 232.8 lb

## 2021-02-01 DIAGNOSIS — I2510 Atherosclerotic heart disease of native coronary artery without angina pectoris: Secondary | ICD-10-CM | POA: Diagnosis not present

## 2021-02-01 DIAGNOSIS — R001 Bradycardia, unspecified: Secondary | ICD-10-CM

## 2021-02-01 DIAGNOSIS — I4811 Longstanding persistent atrial fibrillation: Secondary | ICD-10-CM

## 2021-02-01 DIAGNOSIS — Z9889 Other specified postprocedural states: Secondary | ICD-10-CM | POA: Diagnosis not present

## 2021-02-01 DIAGNOSIS — I10 Essential (primary) hypertension: Secondary | ICD-10-CM

## 2021-02-01 DIAGNOSIS — I350 Nonrheumatic aortic (valve) stenosis: Secondary | ICD-10-CM

## 2021-02-01 DIAGNOSIS — E118 Type 2 diabetes mellitus with unspecified complications: Secondary | ICD-10-CM

## 2021-02-01 DIAGNOSIS — Z8679 Personal history of other diseases of the circulatory system: Secondary | ICD-10-CM

## 2021-02-01 DIAGNOSIS — E785 Hyperlipidemia, unspecified: Secondary | ICD-10-CM

## 2021-02-01 DIAGNOSIS — R200 Anesthesia of skin: Secondary | ICD-10-CM

## 2021-02-01 MED ORDER — AMLODIPINE BESYLATE 5 MG PO TABS: 5.0000 mg | ORAL_TABLET | Freq: Every day | ORAL | 1 refills | Status: DC

## 2021-02-01 NOTE — Patient Instructions (Signed)
Medication Instructions:  Increase Amlodipine to 5 mg daily   *If you need a refill on your cardiac medications before your next appointment, please call your pharmacy*   Lab Work: TSH, CMET, MAG today  LIPID (come back fasting, nothing to eat or drink)  If you have labs (blood work) drawn today and your tests are completely normal, you will receive your results only by: Marland Kitchen MyChart Message (if you have MyChart) OR . A paper copy in the mail If you have any lab test that is abnormal or we need to change your treatment, we will call you to review the results.   Follow-Up: At [facility], you and your health needs are our priority.  As part of our continuing mission to provide you with exceptional heart care, we have created designated Provider Care Teams.  These Care Teams include your primary Cardiologist (physician) and Advanced Practice Providers (APPs -  Physician Assistants and Nurse Practitioners) who all work together to provide you with the care you need, when you need it.  We recommend signing up for the patient portal called "MyChart".  Sign up information is provided on this After Visit Summary.  MyChart is used to connect with patients for Virtual Visits (Telemedicine).  Patients are able to view lab/test results, encounter notes, upcoming appointments, etc.  Non-urgent messages can be sent to your provider as well.   To learn more about what you can do with MyChart, go to [email or web address].    Your next appointment:   4 month(s)  The format for your next appointment:   In Person  Provider:   You may see Nicki Guadalajara, MD or one of the following Advanced Practice Providers on your designated Care Team:    Azalee Course, PA-C  Micah Flesher, PA-C or   Judy Pimple, [place]    Other Instructions Take BP/HR for 2 weeks- please call us with an update [phone]

## 2021-02-02 LAB — TSH: TSH: 2.33 u[IU]/mL (ref 0.450–4.500)

## 2021-02-02 LAB — COMPREHENSIVE METABOLIC PANEL
ALT: 18 IU/L (ref 0–44)
AST: 15 IU/L (ref 0–40)
Albumin/Globulin Ratio: 1.6 (ref 1.2–2.2)
Albumin: 4.4 g/dL (ref 3.6–4.6)
Alkaline Phosphatase: 83 IU/L (ref 44–121)
BUN/Creatinine Ratio: 19 (ref 10–24)
BUN: 20 mg/dL (ref 8–27)
Bilirubin Total: 0.9 mg/dL (ref 0.0–1.2)
CO2: 24 mmol/L (ref 20–29)
Calcium: 9.9 mg/dL (ref 8.6–10.2)
Chloride: 103 mmol/L (ref 96–106)
Creatinine, Ser: 1.05 mg/dL (ref 0.76–1.27)
Globulin, Total: 2.7 g/dL (ref 1.5–4.5)
Glucose: 95 mg/dL (ref 65–99)
Potassium: 4.7 mmol/L (ref 3.5–5.2)
Sodium: 140 mmol/L (ref 134–144)
Total Protein: 7.1 g/dL (ref 6.0–8.5)
eGFR: 69 mL/min/{1.73_m2} (ref >59)

## 2021-02-02 LAB — MAGNESIUM: Magnesium: 2.2 mg/dL (ref 1.6–2.3)

## 2021-02-09 ENCOUNTER — Ambulatory Visit: Payer: Medicare Other | Admitting: Adult Health

## 2021-02-13 ENCOUNTER — Other Ambulatory Visit (HOSPITAL_COMMUNITY): Payer: Self-pay | Admitting: Cardiovascular Disease

## 2021-02-13 NOTE — Telephone Encounter (Signed)
96m, 105.6kg, scr 1.05 02/01/21, lovw/godrich 02/01/21

## 2021-02-23 ENCOUNTER — Telehealth: Payer: Self-pay | Admitting: Cardiovascular Disease

## 2021-02-23 DIAGNOSIS — R001 Bradycardia, unspecified: Secondary | ICD-10-CM | POA: Diagnosis not present

## 2021-02-23 NOTE — Telephone Encounter (Signed)
Spoke with Enos Fling who wanted to report an episode of slow afib with rate of 36 bmp for 60 seconds on 01/31/21 @ 6:24 pm.   Report available for review. Will forward to MD

## 2021-02-23 NOTE — Telephone Encounter (Signed)
I have Anny from [facility] calling with a report for this pt

## 2021-02-26 NOTE — Telephone Encounter (Signed)
We will need to monitor.  If patient is symptomatic with his bradycardia and not on any rate control medication may need to see EP (he has been followed in the A. fib clinic in the past) and if recurrent symptomatology may need evaluation for possible pacemaker.

## 2021-03-01 NOTE — Telephone Encounter (Signed)
Pt updated with MD's recommendations and state he can tell when heart rate is low because he feels really tired. Nurse offered an appointment for next Friday 6/19 to discuss monitor report but pt state he will be out of town. Pt ok with next appointment available on 6/27.   Pt made aware of ED precaution should any new symptoms develop or worsen.

## 2021-03-19 ENCOUNTER — Ambulatory Visit: Payer: Medicare Other | Admitting: Cardiovascular Disease

## 2021-03-19 ENCOUNTER — Other Ambulatory Visit: Payer: Self-pay

## 2021-03-19 ENCOUNTER — Encounter: Payer: Self-pay | Admitting: Cardiovascular Disease

## 2021-03-19 VITALS — BP 138/60 | Resp 18 | Ht 73.0 in | Wt 231.6 lb

## 2021-03-19 DIAGNOSIS — I2510 Atherosclerotic heart disease of native coronary artery without angina pectoris: Secondary | ICD-10-CM

## 2021-03-19 DIAGNOSIS — I4892 Unspecified atrial flutter: Secondary | ICD-10-CM | POA: Diagnosis not present

## 2021-03-19 NOTE — Progress Notes (Signed)
Patient ID: Derek Wells, male   DOB: 04-May-1933, 87 y.o.   MRN: [id number]     HPI: Derek Wells is a 87 y.o. male who presents to the office today for a 6 month followup cardiology evaluation.   Derek Wells has a history of hypertension, hyperlipidemia, and is s/p abdominal aortic aneurysm repair in April 2010. I had seen him on 01/14/2013 at which time he had experienced symptoms worrisome for unstable angina. Previously, in September 2013 he had undergone a nuclear perfusion study  which was essentially normal although did show mild diaphragmatic attenuation. He also been found to have a atherogenic dyslipidemia pattern with reference to his lipid panel. Catheterization the following day on 01/15/2013 revealed life-threatening coronary anatomy with 60-70% ostial left main stenosis, 90-95% distal left main stenosis, 95-99% ostial LAD stenosis, and mild/moderate stenoses in the RCA. That same day he was successfully operated by Derek Wells and underwent CABG x3 with a LIMA to the LAD, SVG to the OM, and SVG to the PDA. He had a unremarkable postoperative course and was discharged 4 days later.  He saw Derek Wells on 01/27/13 and was doing well. I saw him in followup on 03/02/2013 and he  participating  in cardiac rehabilitation. When I saw him in September 2014, he was in sinus rhythm and has and had resumed a good level of activity.   In January 2015, he was found to be in atrial flutter at cardiac rehabilitation. He was unaware of his heart rhythm being abnormal. He had developed a URI over the holidays prior to that and had just completed a steroid dose pack. Xarelto 20 mg was added to his medical regimen. He did undergo subsequent blood work on January 20 which showed normal renal function and normal electrolytes. Liver function studies were normal. Hemoglobin was 13.7 hematocrit 41.7. Platelets were normal. TSH was normal at 2.63. Magnesium was normal at 2.0.  He underwent successful cardioversion for  his atrial flutter on 11/12/2013 and he cardioverted to sinus rhythm at 100 J.  Since his cardioversion, he is unaware of any recurrent rhythm disturbance.   He is in the maintenance phase of cardiac rehabilitation.  When I  saw him in June 2015, he told me that he had noticed several episodes of a vague chest sensation particularly when he rolls his garbage can back up a hill at home.    On 03/18/2014, he underwent a nuclear perfusion study, which showed reduced exercise capacity with a workload of only 5.9.  He did experience mild shortness of breath.  There was no significant ST depression, but he had developed transient left bundle branch block.  The scan was interpreted at low as low risk.  Ejection fraction was 65% without wall motion abnormalities.  An echo Doppler study showed an EF of 60-65%.  There were no regional wall motion abnormalities.  He had mild biatrial enlargement.  Derek Wells currently is playing golf at least 2 times per week and occasionally 3.  He is in the maintenance phase of cardiac rehabilitation.  He does admit to some weight gain.  He denies any episodes of chest pain, particularly during the cardiac rehabilitation program.  He has noticed some mild shortness of breath with deep breathing and some vague pressure in his chest which is short-lived walking up hills when he walks fast with his wife.  He has noticed some mild shortness of breath walking up steep hills in the golf course without chest pressure.  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 2016.  When I saw him in 2017 he was anemic and had significantly reduced iron saturation at 6%, and his serum ferritin was 7.  I referred him for GI evaluation and underwent both colonoscopy and endoscopy by Derek Wells and 4 polyps were removed from his colon.  He was not having any active bleeding.  He does note improved energy.  He had taken Niferex initially and transitioned to over-the-counter iron. He  has more energy.  He is continuing with cardiac rehabilitation.  He underwent a follow-up abdominal ultrasound on 06/09/2015 which showed a patent aortobifemoral bypass graft without focal dilatation or stenosis.   In the late summer of 2017 he was at the beach for several weeks and played golf heat without chest pain.  However with fast walking he noted chest pressure.  For the past month he has had a cough.  He has a ventral hernia.  When I saw him, his ECG showed progressive PR prolongation.  I reduced his Lopressor back to 25 mg daily and added amlodipine 5 mg blood pressure and his chest tightness.  I scheduled him for a nuclear perfusion study which was done on 07/23/2016.  Ejection fraction was 62%.  There were no ECG changes ischemia, but there was a rare PVC.  He had normal perfusion without scar or ischemia.  His follow-up laboratory showed significant improvement with his iron saturation, now at 27%.  Lipid studies revealed a total cholesterol 164, triglycerides 122, HDL 40, and LDL 100.    When I saw him in January 2018, I suggested the addition of Zetia to his atorvastatin.  In the past.  He did not tolerate 80 mg, atorvastatin, and also had myalgias on 40 mg.  I also discussed plaque regression studies with Repatha.  He developed recurrent atrial fibrillation and had multiple evaluations in the A. fib clinic commencing in July 2018.  Ultimately, on 05/13/2017, he underwent successful cardioversion with restoration of sinus rhythm.  He saw Derek Wells in follow-up of his cardioversion and was maintaining sinus rhythm.  There was discussion concerning his EtOH use and reduction in consumption.  He typically has at least 1 or more vodka every day.    When I lsaw him in February 2019 he was continuing to feel well and denied any recurrent episodes of atrial fibrillation.  His last echo Doppler study in October 2018 showed normal LV systolic and diastolic function.  He had biatrial enlargement with  mild aortic valve stenosis, and mean gradient of 14 and peak gradient of 25.  On follow-up abdominal aortic Doppler study the largest aortic measurement was 2.7 cm.   He was playing golf several days per week and was continue to participate in cardiac rehab.  On the evening of May 12, 2018 he was going up steps to bed developed an episode of full sensation in his throat chest with some radiate this was associated with palpitations.  Symptoms lasted approximately 10 minutes.  He was seen in the following morning relation clinic and was found to be in atrial flutter with a controlled ventricular rate.  He was scheduled to undergo DC cardioversion but upon arrival for his cardioversion procedure his ECG showed that he had converted back to sinus rhythm and had PACs.  I last saw on May 29, 2018.  He admitted to occasional lightheadedness and sensation of head fog.  He denied any exertional chest tightness.  He was continuing to drink occasional vodka but admits that his drinking is less.  He was sleeping well.  He denied any significant snoring or daytime sleepiness.  Elder Love revealed normal thyroid function studies.  History was normal although glucose was minimally increased at 103.  CBC was normal.  LDL cholesterol was 71 but triglycerides are elevated at 173 total cholesterol 143 and HDL 37.  Because of increased cardiac murmur on auscultation he underwent a follow-up echo Doppler study in June 25, 2018.  This continued to show excellent ejection fraction at 60 to 65% with grade 2 diastolic dysfunction.  There was mild aortic valve stenosis with a mean gradient of 14, peak instantaneous gradient of 29, and calculated valve area of 1.6 8 cm.  When I saw him last on July 07, 2018 he was maintaining sinus rhythm and was without chest pain PND orthopnea although he still noted mild shortness of breath when bending over.  He was playing golf several days per week.    Since I last saw him in October  2019  he apparently has had recurrent issues with atrial fibrillation.  He has been seen by Kerin Ransom, PA, Almyra Deforest, [place],  Derek Palau, NP and Coletta Memos, NP.  Apparently he has been in longstanding persistent atrial fibrillation for well over a year.  He has been on chronic anticoagulation with Eliquis.  When I saw him in April 2021 after not having seen him in 18 months he denied any recurrent anginal symptomatology.  He was continuing to play golf 2 days/week and had experienced some shortness of breath with uphill walking.  He was walking his dog daily.  Since he had not had an echo in over 2 years I recommended a follow-up echo Doppler study which was done on 02/14/2020.  This revealed an EF of 60 to 65%.  He had mildly increased pulmonary artery systolic pressure 38.7 mm.  There was evidence for mild aortic valve stenosis with a mean gradient of 13.8 and peak gradient of 22.8.  Pulmonary artery systolic pressure was mildly increased at 36.5.  He had normal atrial dimensions.    I last saw him in October 2021.  Unfortunately, recently his wife broke her hip and required hip surgery, rehab therapy.  He has persistent atrial fibrillation and has been on Eliquis 5 mg twice a day.  At that time, his ECG showed atrial fibrillation at 54 bpm.  He continued to be on amlodipine 2.5 mg, irbesartan 150 mg and furosemide 20 mg daily.  He is on atorvastatin 40 mg for hyperlipidemia.  He is on Metformin 500 mg daily.    He underwent an echo Doppler study on January 08, 2021 which showed an EF of 55 to 60% without wall motion abnormalities.  There was mild biatrial enlargement.  There was mild aortic stenosis with a mean gradient of 14 mm and estimated AVA at 1.65 cm.  Abdominal aorta Doppler study on January 05, 2021 showed moderate atherosclerosis throughout the iliac arteries.  The exam was limited due to overlying bowel.  There was no evidence for obvious recurrent aneurysm.  Since I last saw him, he had called  our office in May 2022 with reports of bradycardia with rates in the 40s as well as fatigability.  He was evaluated by Sande Rives on Feb 01, 2021.  He reported heart rates in the 40s with significant fatigability.  He subsequently wore a Zio patch monitor from May 9 through May 23.  He was in atrial fibrillation throughout the monitor with rates ranging from the lowest at 32 on May 16 at 2:21 AM and the highest at 57 at 12:45 PM on May 10 with an average rate of 50 bpm.  He had bundle branch block/IVCD D.  There are occasional isolated PVCs without couplets or triplets.  Presently, he has continued to notice periods of fatigability.  Typically his heart rate is in the low to mid 40s.  However on June 18 at 10:43 AM he reported his heart rate was around 38 bpm.  He also has noted some left calf claudication symptomatology.  He admits to decreased energy.  He still playing golf 2 days/week.  He denies recurrent chest pain.  He presents for evaluation.   Past Medical History:  Diagnosis Date   CAD (coronary artery disease), severe needing CABG 01/19/2013   Chest pain    2D ECHO, 06/16/2012 - EF 50-55%, borderline low left ventricular systolic function   Coronary artery disease    Dyspnea on exertion    LEXISCAN, 06/17/2012 - fixed inferior bowel artifact and basal septal defect, probably related to IVCD/incomplete LBBB, no reversible ischemia, post-stress EF 66%   History of AAA (abdominal aortic aneurysm) repair    ABDOMINAL AORTIC DOPPLER, 06/16/2013 - normal   HTN (hypertension) 01/19/2013   Hyperlipidemia LDL goal < 70 01/19/2013   Hypertension    S/P CABG x 3 01/19/2013    Past Surgical History:  Procedure Laterality Date   CARDIAC CATHETERIZATION  01/15/2013   Severe coronary obstructive disease with 60-70% ostial left main and 90-85% distal LAD stenosis and mild-moderate stenoses in RCA, recommended for CABG   CARDIOVERSION N/A 11/12/2013   Procedure: CARDIOVERSION;  Surgeon: Troy Sine, MD;  Location: [facility] ENDOSCOPY;  Service: Cardiovascular;  Laterality: N/A;  11:16 Lido $RemoveBe'60mg'lYIZpCtlm$ , Propofol $RemoveBe'120mg'zmgeCxcJu$ ,IV 11:19 synch 100 joules conversion conversion to NSR....   CARDIOVERSION N/A 05/13/2017   Procedure: CARDIOVERSION;  Surgeon: Acie Fredrickson Wonda Cheng, MD;  Location: [facility] ENDOSCOPY;  Service: Cardiovascular;  Laterality: N/A;   CORONARY ARTERY BYPASS GRAFT N/A 01/15/2013   Procedure: Coronary Artery Bypass Graft times three using Wells Greater Saphenous Vein Graft harvested endoscopically and Left Internal Mammary Artery and;  Surgeon: Gaye Pollack, MD;  Location: [facility];  Service: Open Heart Surgery;  Laterality: N/A;   INTRAOPERATIVE TRANSESOPHAGEAL ECHOCARDIOGRAM  01/15/2013   Procedure: INTRAOPERATIVE TRANSESOPHAGEAL ECHOCARDIOGRAM;  Surgeon: Gaye Pollack, MD;  Location: [facility];  Service: Open Heart Surgery;;   LEFT HEART CATHETERIZATION WITH CORONARY ANGIOGRAM N/A 01/15/2013   Procedure: LEFT HEART CATHETERIZATION WITH CORONARY ANGIOGRAM;  Surgeon: Troy Sine, MD;  Location: [facility] CATH LAB;  Service: Cardiovascular;  Laterality: N/A;   VASCULAR SURGERY      Allergies  Allergen Reactions   Penicillins Rash    Has patient had a PCN reaction causing immediate rash, facial/tongue/throat swelling, SOB or lightheadedness with hypotension: no Has patient had a PCN reaction causing severe rash involving mucus membranes or skin necrosis: no Has patient had a PCN reaction that required hospitalization: no Has patient had a PCN reaction occurring within the last 10 years: yes If all of the above answers are "NO", then may proceed with Cephalosporin use.     Current Outpatient Medications  Medication Sig Dispense Refill   amLODipine (NORVASC) 5 MG tablet Take 1 tablet (5 mg total) by mouth daily. 90 tablet 1   Ascorbic Acid (VITAMIN C) 1000 MG tablet Take 1,000 mg by mouth daily. 1  Tablet Daily     atorvastatin (LIPITOR) 40 MG tablet TAKE 1 TABLET BY MOUTH EVERY DAY 90 tablet 2    cholecalciferol (VITAMIN D3) 25 MCG (1000 UNIT) tablet Take 1,000 Units by mouth daily. 1 Tablet Daily     ELIQUIS 5 MG TABS tablet TAKE 1 TABLET BY MOUTH TWICE DAILY 180 tablet 1   furosemide (LASIX) 20 MG tablet TAKE 1 TABLET(20 MG) BY MOUTH DAILY 90 tablet 3   irbesartan (AVAPRO) 150 MG tablet TAKE 1 TABLET(150 MG) BY MOUTH DAILY 90 tablet 3   latanoprost (XALATAN) 0.005 % ophthalmic solution 1 drop at bedtime.     metFORMIN (GLUCOPHAGE) 1000 MG tablet Take 1,000 mg by mouth daily.     Multiple Vitamin (MULTIVITAMIN WITH MINERALS) TABS Take 1 tablet by mouth daily.     No current facility-administered medications for this visit.    Socially he is re-married. His first wife of 42 years passed away. He previously had smoked cigars prior to his bypass surgery but has not had any tobacco use since.  He continues to play golf at least 2 times per week.  He does a lot of leg exercise with cardiac rehabilitation.  ROS General: Negative; No fevers, chills, or night sweats; fatigability HEENT: Negative; No changes in vision or hearing, sinus congestion, difficulty swallowing Pulmonary: URI symptoms during the winter; No cough, wheezing, shortness of breath, hemoptysis Cardiovascular: Mild shortness of breath with uphill walking; see history of present illness Possible left calf claudication GI: Negative; No nausea, vomiting, diarrhea, or abdominal pain GU: Negative; No dysuria, hematuria, or difficulty voiding Musculoskeletal: Negative; no myalgias, joint pain, or weakness Hematologic/Oncology: Negative; no easy bruising, bleeding Endocrine: Negative; no heat/cold intolerance; no diabetes Neuro: Negative; no changes in balance, headaches Skin: Removal of small left forearm melanoma. Psychiatric: Negative; No behavioral problems, depression Sleep: Negative; No snoring, daytime sleepiness, hypersomnolence, bruxism, restless legs, hypnogognic hallucinations, no cataplexy Other comprehensive 14  point system review is negative.   PE BP 138/60 (BP Location: Left Arm, Patient Position: Sitting, Cuff Size: Normal)   Resp 18   Ht $R'6\' 1"'Gf$  (1.854 m)   Wt 231 lb 9.6 oz (105.1 kg)   SpO2 96%   BMI 30.56 kg/m    Repeat blood pressure by me 140/70  Wt Readings from Last 3 Encounters:  03/19/21 231 lb 9.6 oz (105.1 kg)  02/01/21 232 lb 12.8 oz (105.6 kg)  07/10/20 232 lb (105.2 kg)   General: Alert, oriented, no distress.  Skin: normal turgor, no rashes, warm and dry HEENT: Normocephalic, atraumatic. Pupils equal round and reactive to light; sclera anicteric; extraocular muscles intact;  Nose without nasal septal hypertrophy Mouth/Parynx benign; Mallinpatti scale 3 Neck: No JVD, no carotid bruits; normal carotid upstroke Lungs: clear to ausculatation and percussion; no wheezing or rales Chest wall: without tenderness to palpitation Heart: PMI not displaced, RRR, s1 s2 normal, 2/6 systolic murmur, consistent with his mild aortic stenosis,   no diastolic murmur, no rubs, gallops, thrills, or heaves Abdomen: soft, nontender; no hepatosplenomehaly, BS+; abdominal aorta nontender and not dilated by palpation. Back: no CVA tenderness Pulses 2+ Musculoskeletal: full range of motion, normal strength, no joint deformities Extremities: no clubbing cyanosis or edema, Homan's sign negative  Neurologic: grossly nonfocal; Cranial nerves grossly wnl Psychologic: Normal mood and affect  ECG (independently read by me): Atrial fibrillation at 47; LAD, QTc 430 msec  October 2021 ECG (independently read by me): Atrial fibrillation at 54; PRWP; QTc 428 bmsec  April 2021  ECG (independently read by me): Atrial fibrillation with an average ventricular rate of 53 bpm with a range of in the 60s to 40s.  One isolated PVC.  Nonspecific interventricular block.  July 07, 2020 ECG (independently read by me): Sinus bradycardia with first-degree AV block.  PR interval 392 ms.  Incomplete left bundle branch  block.  May 29, 2018 ECG (independently read by me): Sinus rhythm with first-degree AV block at 72 bpm with a PR interval of 312 ms.  Occasional PACs.  February 2019 ECG (independently read by me): Sinus rhythm at 63 bpm.  First-degree AV block with a PR interval at 344 ms.  PAC.  Nonspecific interventricular conduction delay.  October 2018 ECG (independently read by me): Sinus rhythm with first-degree AV block with a PR interval of 314 ms.  Nonspecific interventricular block.  QTc interval 431  January 2018 ECG (independently read by me): Normal sinus rhythm with first-degree AV block.  PR interval 300 ms.  Nonspecific interventricular conduction delay.  October 2017 ECG (independently read by me): Normal sinus rhythm at 66 bpm.  First-degree block with a PR interval at 290 ms.  Nonspecific interventricular block.  03/02/2015 ECG (independently read by me): Sinus rhythm at 75 bpm.  Occasional unifocal PVCs.  Nonspecific interventricular block.  Prior October 2015 ECG (independently read by me): Normal sinus rhythm at 67 beats per minute.  First degree A-V block with PR interval at 290 ms.  QTc interval 439 ms.  No ST segment changes.  Prior June 2015 ECG (independently read by me): Normal sinus rhythm at 68 beats per minute with first degree AV block with a PR interval at 284 ms.  QTc interval 435 ms.  No significant ST-T changes.  T-wave inversion in aVL  ECG today (independently read by me):  Normal sinus rhythm at 71 beats per minute. First create a block with PR interval at 240 ms. QTc interval normal at 4-5 ms.  Prior 10/27/13 ECG (independently read by me): Atrial flutter with variable rate but predominantly 4-1 with occasional 3-1 block; average heart rate 70 beats per minute  Prior ECG from 06/04/2013: Sinus rhythm with first-degree AV block. Nonspecific interventricular block  LABS: BMP Latest Ref Rng & Units 02/01/2021 01/10/2020 05/10/2019  Glucose 65 - 99 mg/dL 95 99 107(H)   BUN 8 - 27 mg/dL $Remove'20 13 15  'SrXBFUm$ Creatinine 0.76 - 1.27 mg/dL 1.05 0.90 0.96  BUN/Creat Ratio 10 - $Re'24 19 14 16  'iLm$ Sodium 134 - 144 mmol/L 140 144 141  Potassium 3.5 - 5.2 mmol/L 4.7 4.7 4.8  Chloride 96 - 106 mmol/L 103 105 104  CO2 20 - 29 mmol/L $RemoveB'24 21 22  'AHzsSycu$ Calcium 8.6 - 10.2 mg/dL 9.9 9.6 8.9   Hepatic Function Latest Ref Rng & Units 02/01/2021 01/10/2020 05/10/2019  Total Protein 6.0 - 8.5 g/dL 7.1 6.9 6.6  Albumin 3.6 - 4.6 g/dL 4.4 4.3 4.4  AST 0 - 40 IU/L $Remov'15 16 16  'lbmRJj$ ALT 0 - 44 IU/L $Remov'18 16 13  'JsSfZP$ Alk Phosphatase 44 - 121 IU/L 83 78 69  Total Bilirubin 0.0 - 1.2 mg/dL 0.9 1.1 0.8  Bilirubin, Direct 0.00 - 0.40 mg/dL - - -   CBC Latest Ref Rng & Units 01/10/2020 05/10/2019 05/26/2018  WBC 3.4 - 10.8 x10E3/uL 7.4 8.0 7.9  Hemoglobin 13.0 - 17.7 g/dL 13.8 12.1(L) 14.7  Hematocrit 37.5 - 51.0 % 41.6 38.0 43.6  Platelets 150 - 450 x10E3/uL 228 258 267   Lab Results  Component  Value Date   MCV 87 01/10/2020   MCV 83 05/10/2019   MCV 90 05/26/2018   Lab Results  Component Value Date   TSH 2.330 02/01/2021   Lipid Panel     Component Value Date/Time   CHOL 125 01/10/2020 1035   CHOL 111 03/01/2015 1033   TRIG 113 01/10/2020 1035   TRIG 90 03/01/2015 1033   HDL 36 (L) 01/10/2020 1035   HDL 41 03/01/2015 1033   CHOLHDL 3.5 01/10/2020 1035   CHOLHDL 4.1 07/19/2016 0949   VLDL 24 07/19/2016 0949   LDLCALC 68 01/10/2020 1035   LDLCALC 52 03/01/2015 1033     RADIOLOGY: No results found.  IMPRESSION:  1. CAD in native artery   2. Paroxysmal atrial flutter [facility])     ASSESSMENT AND PLAN: Mr. Tozzi is an active 87 year old white male who is status post emergent CABG revascularization surgery after cardiac catheterization on 01/15/2013 demonstrated severe life threatening coronary anatomy.  He  developed atrial flutter in January 2015 and was started on anticoagulation with Xarelto and underwent cardioversion.  He developed recurrent episodes of atrial fibrillation in 2018 and had  maintained sinus rhythm since his repeat cardioversion on 05/13/2017 but developed recurrent atrial flutter 1 year later in May 13, 2018 was found to have a flutter with controlled rate.  When seen him in October 2019 he was maintaining sinus rhythm.  However since that time he has been seen on multiple occasions by numerous providers and he is now felt to have longstanding persistent atrial fibrillation.  He has been maintained on Eliquis for anticoagulation. An echo Doppler study in 2019 showed normal EF at 60 to 65% with a mean aortic gradient of 14 consistent with mild aortic stenosis.  He had grade 2 diastolic dysfunction.  A 2-year follow-up echo Doppler in May 2021 continued to show normal systolic function.  Aortic stenosis remains mild with a mean gradient of 13.8 and peak gradient of 22.8 mmHg.  There was mild pulmonary hypertension with a PA systolic pressure of 19.5.  His most recent echo Doppler study from April 2022 is not significantly changed and continues to show normal systolic function with mild biatrial enlargement, and mild aortic stenosis with a mean gradient of 14 and estimated AVA of 1.65 cm.  Mr. Myler has longstanding persistent atrial fibrillation and over the last several years his ventricular rate has slowly decreased.  He is now having periods of significant fatigability with low heart rates.  He showed me on his phone a reading from June 18 at 10:43 AM where his heart rate was 38 bpm.  I reviewed his most recent Zio patch findings with him in detail and on that evaluation his slowest heart rate was 32 bpm.  I have recommended he undergo electrophysiology evaluation.  ECG today shows baseline heart rate in the mid 40s but he may ultimately require permanent pacemaker in light of his progressive fatigability and no energy.  His blood pressure today is relatively stable on his regimen consisting of amlodipine 5 mg, irbesartan 150 mg, and he has continued to take furosemide 20 mg daily.   He has no signs of volume overload on exam.  I have suggested he try decreasing his furosemide to perhaps every other day or every third day or depending upon the results possibly change to a as needed basis.  He is not on any rate control medications.  He also may be developing claudication symptoms in his left calf with walking.  I am scheduling him for lower extremity arterial Doppler studies.  He is fasting today and I will check a fasting lipid panel and CBC.  He continues to be on atorvastatin 40 mg for his hyperlipidemia.  He is diabetic on metformin.  He otherwise had recent laboratory in the nonfasting state including chemistry magnesium and thyroid function studies.  I will see him in 4 months for reevaluation or sooner as needed.   Troy Sine, MD, [facility]  03/19/2021 1:04 PM

## 2021-03-19 NOTE — Patient Instructions (Signed)
Medication Instructions:  Your physician recommends that you continue on your current medications as directed. Please refer to the Current Medication list given to you today.  *If you need a refill on your cardiac medications before your next appointment, please call your pharmacy*   Lab Work: Lipid, CBC If you have labs (blood work) drawn today and your tests are completely normal, you will receive your results only by: MyChart Message (if you have MyChart) OR A paper copy in the mail If you have any lab test that is abnormal or we need to change your treatment, we will call you to review the results.   Testing/Procedures: Your physician has requested that you have a lower extremity arterial duplex. This test is an ultrasound of the arteries in the legs or arms. It looks at arterial blood flow in the legs and arms. Allow one hour for Lower and Upper Arterial scans. There are no restrictions or special instructions    Follow-Up: At [facility], you and your health needs are our priority.  As part of our continuing mission to provide you with exceptional heart care, we have created designated Provider Care Teams.  These Care Teams include your primary Cardiologist (physician) and Advanced Practice Providers (APPs -  Physician Assistants and Nurse Practitioners) who all work together to provide you with the care you need, when you need it.  We recommend signing up for the patient portal called "MyChart".  Sign up information is provided on this After Visit Summary.  MyChart is used to connect with patients for Virtual Visits (Telemedicine).  Patients are able to view lab/test results, encounter notes, upcoming appointments, etc.  Non-urgent messages can be sent to your provider as well.   To learn more about what you can do with MyChart, go to [email or web address].    Your next appointment:   4 month(s)  The format for your next appointment:   In Person  Provider:   Nicki Guadalajara,  MD

## 2021-03-20 LAB — CBC
Hematocrit: 40.7 % (ref 37.5–51.0)
Hemoglobin: 13.1 g/dL (ref 13.0–17.7)
MCH: 26.8 pg (ref 26.6–33.0)
MCHC: 32.2 g/dL (ref 31.5–35.7)
MCV: 83 fL (ref 79–97)
Platelets: 259 10*3/uL (ref 150–450)
RBC: 4.88 x10E6/uL (ref 4.14–5.80)
RDW: 16.2 % — ABNORMAL HIGH (ref 11.6–15.4)
WBC: 9 10*3/uL (ref 3.4–10.8)

## 2021-03-20 LAB — LIPID PANEL
Chol/HDL Ratio: 3.3 ratio (ref 0.0–5.0)
Cholesterol, Total: 170 mg/dL (ref 100–199)
HDL: 51 mg/dL (ref >39)
LDL Chol Calc (NIH): 103 mg/dL — ABNORMAL HIGH (ref 0–99)
Triglycerides: 86 mg/dL (ref 0–149)
VLDL Cholesterol Cal: 16 mg/dL (ref 5–40)

## 2021-03-30 ENCOUNTER — Other Ambulatory Visit: Payer: Self-pay | Admitting: Cardiovascular Disease

## 2021-03-30 DIAGNOSIS — I739 Peripheral vascular disease, unspecified: Secondary | ICD-10-CM

## 2021-04-09 ENCOUNTER — Telehealth: Payer: Self-pay | Admitting: Cardiovascular Disease

## 2021-04-09 NOTE — Telephone Encounter (Signed)
Returned call to patient to go over recent blood work results. Advised patient of the following:   Derek Bihari, MD  04/08/2021  3:25 PM EDT      LDL remains increased at 103 despite being on atorvastatin 40 mg.  In the past he did not tolerate atorvastatin 80 mg.  Add Zetia 10 mg daily.  Recheck lipidstudies in 3 to 4 months with LFTs.  CBC stable   Patient states that he tried Zetia in the past and he did not like the way it made him feel. Patient states that he does not want to start another medication right now and would like to focus more on his diet before starting another cholesterol medication and recheck lipids again. Advised patient that I would like Dr. Tresa Endo know.  Advised patient to call back to office with any issues, questions, or concerns. Patient verbalized understanding.

## 2021-04-09 NOTE — Telephone Encounter (Signed)
Patient returning call in regards to lab results.  

## 2021-04-10 NOTE — Telephone Encounter (Signed)
Returned pt call, made pt aware Dr. Tresa Endo had reviewed his message from 7/18. Pt verbalized understanding, all questions/concerns addressed at this time.

## 2021-04-10 NOTE — Telephone Encounter (Signed)
Attempted to call patient back, no answer. Nonspecific message left for pt to return office call.

## 2021-04-11 ENCOUNTER — Other Ambulatory Visit: Payer: Self-pay

## 2021-04-11 ENCOUNTER — Ambulatory Visit (HOSPITAL_COMMUNITY)
Admission: RE | Admit: 2021-04-11 | Discharge: 2021-04-11 | Disposition: A | Discharge status: Home / Self Care | Payer: Medicare Other | Source: Ambulatory Visit | Attending: Cardiology | Admitting: Cardiology

## 2021-04-11 DIAGNOSIS — I739 Peripheral vascular disease, unspecified: Secondary | ICD-10-CM

## 2021-04-20 NOTE — Telephone Encounter (Signed)
Patient calling back, wondering about his ultrasound results from 7/20. Advised pt that Dr. Tresa Endo had not yet written a result note for the test and that he would be contacted when the result is completed. Pt verbalized understanding.

## 2021-04-24 NOTE — Telephone Encounter (Signed)
Ultrasound was reviewed several days ago and patient should have been notified

## 2021-04-25 NOTE — Telephone Encounter (Signed)
Per chart review, pt was made aware of results on 8/2 and a follow up appointment scheduled.

## 2021-04-30 NOTE — Progress Notes (Signed)
Electrophysiology Office Note:    Date:  05/01/2021   ID:  Derek Wells, DOB June 28, 1933, MRN [id number]  PCP:  Marden Noble, MD  [facility] Cardiologist:  Nicki Guadalajara, MD  [facility] Electrophysiologist:  Lanier Prude, MD   Referring MD: Lennette Bihari, MD   Chief Complaint: bradycardia  History of Present Illness:    Derek Wells is a 87 y.o. male who presents for an evaluation of symptomatic bradycardia at the request of Dr Tresa Endo. Their medical history includes AAA, CAD s/p CABG in 2014, AFL/AF. At the last visit with Dr Tresa Endo on 03/19/2021, he reported early fatigue and low HR ranging from high 30s to low 40s.   Today he tells me he knows he is out of rhythm.  He tells me that despite being out of rhythm he is still fairly active.  He golfs regularly and rides the cart.  He says sometimes he feels fatigued and will check his heart rate and will be in the 40s or low 50s.  No syncope or presyncope.  There is also an element of peripheral arterial disease that is contributing to his symptoms.  He tells me that he has some weakness in his legs when he exerts himself.  He recently had lower extremity ultrasounds which showed significant SFA disease.  He has a follow-up appointment scheduled with Dr. Gery Pray to discuss this further.  Past Medical History:  Diagnosis Date   CAD (coronary artery disease), severe needing CABG 01/19/2013   Chest pain    2D ECHO, 06/16/2012 - EF 50-55%, borderline low left ventricular systolic function   Coronary artery disease    Dyspnea on exertion    LEXISCAN, 06/17/2012 - fixed inferior bowel artifact and basal septal defect, probably related to IVCD/incomplete LBBB, no reversible ischemia, post-stress EF 66%   History of AAA (abdominal aortic aneurysm) repair    ABDOMINAL AORTIC DOPPLER, 06/16/2013 - normal   HTN (hypertension) 01/19/2013   Hyperlipidemia LDL goal < 70 01/19/2013   Hypertension    S/P CABG x 3 01/19/2013    Past Surgical History:   Procedure Laterality Date   CARDIAC CATHETERIZATION  01/15/2013   Severe coronary obstructive disease with 60-70% ostial left main and 90-85% distal LAD stenosis and mild-moderate stenoses in RCA, recommended for CABG   CARDIOVERSION N/A 11/12/2013   Procedure: CARDIOVERSION;  Surgeon: Lennette Bihari, MD;  Location: [facility] ENDOSCOPY;  Service: Cardiovascular;  Laterality: N/A;  11:16 Lido 60mg , Propofol 120mg ,IV 11:19 synch 100 joules conversion conversion to NSR....   CARDIOVERSION N/A 05/13/2017   Procedure: CARDIOVERSION;  Surgeon: 05/15/2017, MD;  Location: [facility] ENDOSCOPY;  Service: Cardiovascular;  Laterality: N/A;   CORONARY ARTERY BYPASS GRAFT N/A 01/15/2013   Procedure: Coronary Artery Bypass Graft times three using Right Greater Saphenous Vein Graft harvested endoscopically and Left Internal Mammary Artery and;  Surgeon: [facility], MD;  Location: [facility] OR;  Service: Open Heart Surgery;  Laterality: N/A;   INTRAOPERATIVE TRANSESOPHAGEAL ECHOCARDIOGRAM  01/15/2013   Procedure: INTRAOPERATIVE TRANSESOPHAGEAL ECHOCARDIOGRAM;  Surgeon: Alleen Borne, MD;  Location: [facility] OR;  Service: Open Heart Surgery;;   LEFT HEART CATHETERIZATION WITH CORONARY ANGIOGRAM N/A 01/15/2013   Procedure: LEFT HEART CATHETERIZATION WITH CORONARY ANGIOGRAM;  Surgeon: Alleen Borne, MD;  Location[facility] CATH LAB;  Service: Cardiovascular;  Laterality: N/A;   VASCULAR SURGERY      Current Medications: Current Meds  Medication Sig   amLODipine (NORVASC) 5 MG tablet Take 1 tablet (5  mg total) by mouth daily.   Ascorbic Acid (VITAMIN C) 1000 MG tablet Take 1,000 mg by mouth daily. 1 Tablet Daily   atorvastatin (LIPITOR) 40 MG tablet TAKE 1 TABLET BY MOUTH EVERY DAY   cholecalciferol (VITAMIN D3) 25 MCG (1000 UNIT) tablet Take 1,000 Units by mouth daily. 1 Tablet Daily   ELIQUIS 5 MG TABS tablet TAKE 1 TABLET BY MOUTH TWICE DAILY   irbesartan (AVAPRO) 150 MG tablet TAKE 1 TABLET(150 MG) BY MOUTH DAILY   latanoprost  (XALATAN) 0.005 % ophthalmic solution 1 drop at bedtime.   metFORMIN (GLUCOPHAGE) 1000 MG tablet Take 1,000 mg by mouth daily.   Multiple Vitamin (MULTIVITAMIN WITH MINERALS) TABS Take 1 tablet by mouth daily.     Allergies:   Penicillins   Social History   Socioeconomic History   Marital status: Married    Spouse name: Not on file   Number of children: Not on file   Years of education: Not on file   Highest education level: Not on file  Occupational History   Not on file  Tobacco Use   Smoking status: Former    Types: Cigars    Quit date: 01/16/2013    Years since quitting: 8.2   Smokeless tobacco: Never  Substance and Sexual Activity   Alcohol use: Yes    Alcohol/week: 14.0 standard drinks    Types: 14 Standard drinks or equivalent per week   Drug use: No   Sexual activity: Not on file  Other Topics Concern   Not on file  Social History Narrative   Not on file   Social Determinants of Health   Financial Resource Strain: Not on file  Food Insecurity: Not on file  Transportation Needs: Not on file  Physical Activity: Not on file  Stress: Not on file  Social Connections: Not on file     Family History: The patient's family history includes Heart disease in his brother, father, and mother; Hyperlipidemia in his mother; Hypertension in his father.  ROS:   Please see the history of present illness.    All other systems reviewed and are negative.  EKGs/Labs/Other Studies Reviewed:    The following studies were reviewed today:  02/26/2021 [facility] personally reviewed HR 32 - 94, average 50 bpm 100% AF Occasional PVCs, 1.4%  01/08/2021 Echo personally reviewed EF 55% RV normal Dilated LA/RA Mild AS  EKG:  The ekg ordered today demonstrates atrial fibrillation with a ventricular rate of 54 bpm.  Nonspecific interventricular conduction delay  Recent Labs: 02/01/2021: ALT 18; BUN 20; Creatinine, Ser 1.05; Magnesium 2.2; Potassium 4.7; Sodium 140; TSH 2.330 03/19/2021:  Hemoglobin 13.1; Platelets 259  Recent Lipid Panel    Component Value Date/Time   CHOL 170 03/19/2021 1200   CHOL 111 03/01/2015 1033   TRIG 86 03/19/2021 1200   TRIG 90 03/01/2015 1033   HDL 51 03/19/2021 1200   HDL 41 03/01/2015 1033   CHOLHDL 3.3 03/19/2021 1200   CHOLHDL 4.1 07/19/2016 0949   VLDL 24 07/19/2016 0949   LDLCALC 103 (H) 03/19/2021 1200   LDLCALC 52 03/01/2015 1033    Physical Exam:    VS:  BP 130/74   Pulse (!) 54   Ht 6\' 1"  (1.854 m)   Wt 233 lb 9.6 oz (106 kg)   SpO2 94%   BMI 30.82 kg/m     Wt Readings from Last 3 Encounters:  05/01/21 233 lb 9.6 oz (106 kg)  03/19/21 231 lb 9.6 oz (105.1 kg)  02/01/21 232 lb 12.8 oz (105.6 kg)     GEN:  Well nourished, well developed in no acute distress HEENT: Normal NECK: No JVD; No carotid bruits LYMPHATICS: No lymphadenopathy CARDIAC: Irregularly irregular, no murmurs, rubs, gallops RESPIRATORY:  Clear to auscultation without rales, wheezing or rhonchi  ABDOMEN: Soft, non-tender, non-distended MUSCULOSKELETAL:  No edema; No deformity  SKIN: Warm and dry NEUROLOGIC:  Alert and oriented x 3 PSYCHIATRIC:  Normal affect   ASSESSMENT:    1. Longstanding persistent atrial fibrillation (HCC)   2. Hx of CABG   3. Primary hypertension   4. Bradycardia    PLAN:    In order of problems listed above:  1. Longstanding persistent atrial fibrillation [facility]) The patient has a long history of atrial fibrillation.  He is out of rhythm today with a ventricular rate in the 50s.  We discussed the management options for his atrial fibrillation.  We discussed using antiarrhythmic drug therapy.  Also discussed using a pacemaker to help improve his heart rates and to facilitate better control of his atrial fibrillation.  We had a long discussion about the pros and cons of each option.  I think both options at risks and may not reliably improve his stamina.  There is also the possibility that his some of his symptoms are  related to his PAD rather than rated cardia or atrial fibrillation.    I did tell the patient that there is a good chance that he will require permanent pacemaker in the future.  After our discussion, we mutually decided to arrange follow-up in 6 to 9 months or sooner as needed.  If he develops lightheadedness or dizziness associated with his bradycardia, would plan to proceed with permanent pacemaker implant.  2. Hx of CABG No ischemic symptoms today.  3. Primary hypertension Controlled  4. Bradycardia Chronic issue.  At this point, I do not have any clear evidence that implanting a permanent pacemaker will improve his stamina or quality of life.  If his bradycardia worsens further or he has lightheadedness or dizziness that corresponds to the lower heart rates, would plan to proceed with pacemaker implant.   Follow-up 9 months     Total time spent with patient today 65 minutes. This includes reviewing records, evaluating the patient and coordinating care.  Medication Adjustments/Labs and Tests Ordered: Current medicines are reviewed at length with the patient today.  Concerns regarding medicines are outlined above.  No orders of the defined types were placed in this encounter.  No orders of the defined types were placed in this encounter.    Signed, Rossie Muskrat. Lalla Brothers, MD, [facility], [facility] 05/01/2021 9:21 AM    Electrophysiology [facility]

## 2021-05-01 ENCOUNTER — Other Ambulatory Visit: Payer: Self-pay

## 2021-05-01 ENCOUNTER — Encounter: Payer: Self-pay | Admitting: Cardiology

## 2021-05-01 ENCOUNTER — Ambulatory Visit: Payer: Medicare Other | Admitting: Cardiology

## 2021-05-01 VITALS — BP 130/74 | HR 54 | Ht 73.0 in | Wt 233.6 lb

## 2021-05-01 DIAGNOSIS — Z951 Presence of aortocoronary bypass graft: Secondary | ICD-10-CM

## 2021-05-01 DIAGNOSIS — I10 Essential (primary) hypertension: Secondary | ICD-10-CM | POA: Diagnosis not present

## 2021-05-01 DIAGNOSIS — I4811 Longstanding persistent atrial fibrillation: Secondary | ICD-10-CM

## 2021-05-01 DIAGNOSIS — R001 Bradycardia, unspecified: Secondary | ICD-10-CM | POA: Diagnosis not present

## 2021-05-01 NOTE — Patient Instructions (Addendum)
Medication Instructions:  Your physician recommends that you continue on your current medications as directed. Please refer to the Current Medication list given to you today. *If you need a refill on your cardiac medications before your next appointment, please call your pharmacy*  Lab Work: None ordered. If you have labs (blood work) drawn today and your tests are completely normal, you will receive your results only by: MyChart Message (if you have MyChart) OR A paper copy in the mail If you have any lab test that is abnormal or we need to change your treatment, we will call you to review the results.  Testing/Procedures: None ordered.  Follow-Up: At [facility], you and your health needs are our priority.  As part of our continuing mission to provide you with exceptional heart care, we have created designated Provider Care Teams.  These Care Teams include your primary Cardiologist (physician) and Advanced Practice Providers (APPs -  Physician Assistants and Nurse Practitioners) who all work together to provide you with the care you need, when you need it.  Your next appointment:   Your physician wants you to follow-up in: 9 months with CAMERON T LAMBERT, MD.  You will receive a reminder letter in the mail two months in advance. If you don't receive a letter, please call our office to schedule the follow-up appointment.  

## 2021-05-02 DIAGNOSIS — H40023 Open angle with borderline findings, high risk, bilateral: Secondary | ICD-10-CM | POA: Diagnosis not present

## 2021-05-07 DIAGNOSIS — L570 Actinic keratosis: Secondary | ICD-10-CM | POA: Diagnosis not present

## 2021-05-07 DIAGNOSIS — L819 Disorder of pigmentation, unspecified: Secondary | ICD-10-CM | POA: Diagnosis not present

## 2021-05-07 DIAGNOSIS — L905 Scar conditions and fibrosis of skin: Secondary | ICD-10-CM | POA: Diagnosis not present

## 2021-05-07 DIAGNOSIS — L821 Other seborrheic keratosis: Secondary | ICD-10-CM | POA: Diagnosis not present

## 2021-05-14 ENCOUNTER — Ambulatory Visit: Payer: Medicare Other | Admitting: Cardiovascular Disease

## 2021-05-15 ENCOUNTER — Ambulatory Visit: Payer: Medicare Other | Admitting: Cardiovascular Disease

## 2021-05-15 ENCOUNTER — Encounter: Payer: Self-pay | Admitting: Cardiovascular Disease

## 2021-05-15 ENCOUNTER — Other Ambulatory Visit: Payer: Self-pay

## 2021-05-15 DIAGNOSIS — I739 Peripheral vascular disease, unspecified: Secondary | ICD-10-CM

## 2021-05-15 NOTE — Progress Notes (Signed)
05/15/2021 Derek Wells   1933-05-05  [id number]  Primary Physician Marden Noble, MD Primary Cardiologist: Runell Gess MD Nicholes Calamity, [place]  HPI:  Derek Wells is a 87 y.o. moderately overweight married Caucasian male father of 2, grandfather of 4 grandchildren who was referred to me by Dr. Tresa Endo for symptomatic PAD.  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  He quit smoking 30 years ago.  He does have a history of treated hypertension, diabetes and hyperlipidemia.  He had aortobifemoral bypass grafting by Dr. Betti Cruz in 2013 and coronary artery bypass grafting 01/15/2013.  He is noticed left calf claudication of the last 6 months.  He is fairly active.  He had Doppler studies performed in our office 04/11/2021 revealing a left ABI of 0.87 with high-frequency signals of both SFAs.   Current Meds  Medication Sig   amLODipine (NORVASC) 5 MG tablet Take 1 tablet (5 mg total) by mouth daily.   Ascorbic Acid (VITAMIN C) 1000 MG tablet Take 1,000 mg by mouth daily. 1 Tablet Daily   atorvastatin (LIPITOR) 40 MG tablet TAKE 1 TABLET BY MOUTH EVERY DAY   cholecalciferol (VITAMIN D3) 25 MCG (1000 UNIT) tablet Take 1,000 Units by mouth daily. 1 Tablet Daily   ELIQUIS 5 MG TABS tablet TAKE 1 TABLET BY MOUTH TWICE DAILY   irbesartan (AVAPRO) 150 MG tablet TAKE 1 TABLET(150 MG) BY MOUTH DAILY   latanoprost (XALATAN) 0.005 % ophthalmic solution 1 drop at bedtime.   metFORMIN (GLUCOPHAGE) 1000 MG tablet Take 1,000 mg by mouth daily.   Multiple Vitamin (MULTIVITAMIN WITH MINERALS) TABS Take 1 tablet by mouth daily.     Allergies  Allergen Reactions   Penicillins Rash    Has patient had a PCN reaction causing immediate rash, facial/tongue/throat swelling, SOB or lightheadedness with hypotension: no Has patient had a PCN reaction causing severe rash involving mucus membranes or skin necrosis: no Has patient had a PCN reaction that required hospitalization: no Has patient had a  PCN reaction occurring within the last 10 years: yes If all of the above answers are "NO", then may proceed with Cephalosporin use.     Social History   Socioeconomic History   Marital status: Married    Spouse name: Not on file   Number of children: Not on file   Years of education: Not on file   Highest education level: Not on file  Occupational History   Not on file  Tobacco Use   Smoking status: Former    Types: Cigars    Quit date: 01/16/2013    Years since quitting: 8.3   Smokeless tobacco: Never  Substance and Sexual Activity   Alcohol use: Yes    Alcohol/week: 14.0 standard drinks    Types: 14 Standard drinks or equivalent per week   Drug use: No   Sexual activity: Not on file  Other Topics Concern   Not on file  Social History Narrative   Not on file   Social Determinants of Health   Financial Resource Strain: Not on file  Food Insecurity: Not on file  Transportation Needs: Not on file  Physical Activity: Not on file  Stress: Not on file  Social Connections: Not on file  Intimate Partner Violence: Not on file     Review of Systems: General: negative for chills, fever, night sweats or weight changes.  Cardiovascular: negative for chest pain, dyspnea on exertion, edema, orthopnea, palpitations, paroxysmal nocturnal dyspnea  or shortness of breath Dermatological: negative for rash Respiratory: negative for cough or wheezing Urologic: negative for hematuria Abdominal: negative for nausea, vomiting, diarrhea, bright red blood per rectum, melena, or hematemesis Neurologic: negative for visual changes, syncope, or dizziness All other systems reviewed and are otherwise negative except as noted above.    Blood pressure (!) 134/50, pulse 63, height 6\' 1"  (1.854 m), weight 233 lb (105.7 kg).  General appearance: alert and no distress Neck: no adenopathy, no JVD, supple, symmetrical, trachea midline, thyroid not enlarged, symmetric, no tenderness/mass/nodules, and  soft bilateral carotid bruits Lungs: clear to auscultation bilaterally Heart: Soft outflow tract murmur consistent with aortic stenosis Extremities: extremities normal, atraumatic, no cyanosis or edema Pulses: 2+ and symmetric Skin: Skin color, texture, turgor normal. No rashes or lesions Neurologic: Grossly normal  EKG sinus rhythm at 63 with left bundle branch block.  I personally reviewed this EKG.  ASSESSMENT AND PLAN:   Peripheral arterial disease [facility]) [name] was referred to me by Dr. Shea Evans for symptomatic PAD.  He has a history of aortobifemoral bypass grafting in 2013 by Dr. 2014.  He has multiple cardiac risk factors.  He is very active and plays golf.  He is noticed left calf claudication over the last 6 months.  He had lower extremity arterial Doppler studies performed in our office 04/12/2021 revealing a right ABI of 1.08 and a left of 0.87.  He had high-frequency signals in both SFAs.  His symptoms are lifestyle limiting.  Because he is a prior patient of the vein and vascular specialist I am going to refer him back there for evaluation of potential endovascular therapy.     04/14/2021 MD FACP,FACC,FAHA, [facility] 05/15/2021 11:48 AM

## 2021-05-15 NOTE — Patient Instructions (Signed)
Medication Instructions:  The current medical regimen is effective;  continue present plan and medications.  *If you need a refill on your cardiac medications before your next appointment, please call your pharmacy*   Follow-Up: At [facility], you and your health needs are our priority.  As part of our continuing mission to provide you with exceptional heart care, we have created designated Provider Care Teams.  These Care Teams include your primary Cardiologist (physician) and Advanced Practice Providers (APPs -  Physician Assistants and Nurse Practitioners) who all work together to provide you with the care you need, when you need it.  We recommend signing up for the patient portal called "MyChart".  Sign up information is provided on this After Visit Summary.  MyChart is used to connect with patients for Virtual Visits (Telemedicine).  Patients are able to view lab/test results, encounter notes, upcoming appointments, etc.  Non-urgent messages can be sent to your provider as well.   To learn more about what you can do with MyChart, go to [email or web address].    Your next appointment:   As needed  The format for your next appointment:   In Person  Provider:   Nanetta Batty, MD   Other Instructions Referral to [facility]- Dr.Clark they will call you to get an appointment.

## 2021-05-15 NOTE — Assessment & Plan Note (Signed)
Derek Wells was referred to me by Dr. Tresa Endo for symptomatic PAD.  He has a history of aortobifemoral bypass grafting in 2013 by Dr. Betti Cruz.  He has multiple cardiac risk factors.  He is very active and plays golf.  He is noticed left calf claudication over the last 6 months.  He had lower extremity arterial Doppler studies performed in our office 04/12/2021 revealing a right ABI of 1.08 and a left of 0.87.  He had high-frequency signals in both SFAs.  His symptoms are lifestyle limiting.  Because he is a prior patient of the vein and vascular specialist I am going to refer him back there for evaluation of potential endovascular therapy.

## 2021-05-22 ENCOUNTER — Other Ambulatory Visit: Payer: Self-pay

## 2021-05-22 ENCOUNTER — Encounter: Payer: Self-pay | Admitting: Vascular Surgery

## 2021-05-22 ENCOUNTER — Ambulatory Visit: Payer: Medicare Other | Admitting: Vascular Surgery

## 2021-05-22 VITALS — BP 160/68 | HR 55 | Temp 98.1°F | Resp 18 | Ht 73.0 in | Wt 228.0 lb

## 2021-05-22 DIAGNOSIS — I739 Peripheral vascular disease, unspecified: Secondary | ICD-10-CM | POA: Diagnosis not present

## 2021-05-22 NOTE — Progress Notes (Signed)
Patient name: Derek Wells MRN: [id number] DOB: 09-09-1933 Sex: male  REASON FOR VISIT: Evaluate left leg claudication  HPI: Derek Wells is a 87 y.o. male with history of open AAA repair with aortobiiliac bypass by Dr. Hart Rochester as well as coronary artery disease status post CABG that presents for evaluation of left lower extremity claudication.  Patient states has had been burning in the left calf over the last 3 to 6 months.  This typically limits his mobility after 1/2 mile.  Usually bothers him when walking his dog for long distance.  He is able to play golf without any problems.  States he was concerned about it.  He saw Dr. Allyson Sabal recently who then referred him back to our practice given he was previously under the care of Dr. Hart Rochester in our practice.  He has no rest pain or open wounds.  Past Medical History:  Diagnosis Date   CAD (coronary artery disease), severe needing CABG 01/19/2013   Chest pain    2D ECHO, 06/16/2012 - EF 50-55%, borderline low left ventricular systolic function   Coronary artery disease    Dyspnea on exertion    LEXISCAN, 06/17/2012 - fixed inferior bowel artifact and basal septal defect, probably related to IVCD/incomplete LBBB, no reversible ischemia, post-stress EF 66%   History of AAA (abdominal aortic aneurysm) repair    ABDOMINAL AORTIC DOPPLER, 06/16/2013 - normal   HTN (hypertension) 01/19/2013   Hyperlipidemia LDL goal < 70 01/19/2013   Hypertension    S/P CABG x 3 01/19/2013    Past Surgical History:  Procedure Laterality Date   CARDIAC CATHETERIZATION  01/15/2013   Severe coronary obstructive disease with 60-70% ostial left main and 90-85% distal LAD stenosis and mild-moderate stenoses in RCA, recommended for CABG   CARDIOVERSION N/A 11/12/2013   Procedure: CARDIOVERSION;  Surgeon: Lennette Bihari, MD;  Location: [facility] ENDOSCOPY;  Service: Cardiovascular;  Laterality: N/A;  11:16 Lido 60mg , Propofol 120mg ,IV 11:19 synch 100 joules conversion conversion to  NSR....   CARDIOVERSION N/A 05/13/2017   Procedure: CARDIOVERSION;  Surgeon: 05/15/2017, MD;  Location: [facility] ENDOSCOPY;  Service: Cardiovascular;  Laterality: N/A;   CORONARY ARTERY BYPASS GRAFT N/A 01/15/2013   Procedure: Coronary Artery Bypass Graft times three using Right Greater Saphenous Vein Graft harvested endoscopically and Left Internal Mammary Artery and;  Surgeon: [facility], MD;  Location: [facility] OR;  Service: Open Heart Surgery;  Laterality: N/A;   INTRAOPERATIVE TRANSESOPHAGEAL ECHOCARDIOGRAM  01/15/2013   Procedure: INTRAOPERATIVE TRANSESOPHAGEAL ECHOCARDIOGRAM;  Surgeon: Alleen Borne, MD;  Location: [facility] OR;  Service: Open Heart Surgery;;   LEFT HEART CATHETERIZATION WITH CORONARY ANGIOGRAM N/A 01/15/2013   Procedure: LEFT HEART CATHETERIZATION WITH CORONARY ANGIOGRAM;  Surgeon: Alleen Borne, MD;  Location: [facility] CATH LAB;  Service: Cardiovascular;  Laterality: N/A;   VASCULAR SURGERY      Family History  Problem Relation Age of Onset   Hyperlipidemia Mother    Heart disease Mother    Heart disease Father    Hypertension Father    Heart disease Brother     SOCIAL HISTORY: Social History   Tobacco Use   Smoking status: Former    Types: Cigars    Quit date: 01/16/2013    Years since quitting: 8.3   Smokeless tobacco: Never  Substance Use Topics   Alcohol use: Yes    Alcohol/week: 14.0 standard drinks    Types: 14 Standard drinks or equivalent per week    Allergies  Allergen  Reactions   Penicillins Rash    Has patient had a PCN reaction causing immediate rash, facial/tongue/throat swelling, SOB or lightheadedness with hypotension: no Has patient had a PCN reaction causing severe rash involving mucus membranes or skin necrosis: no Has patient had a PCN reaction that required hospitalization: no Has patient had a PCN reaction occurring within the last 10 years: yes If all of the above answers are "NO", then may proceed with Cephalosporin use.     Current  Outpatient Medications  Medication Sig Dispense Refill   amLODipine (NORVASC) 5 MG tablet Take 1 tablet (5 mg total) by mouth daily. 90 tablet 1   Ascorbic Acid (VITAMIN C) 1000 MG tablet Take 1,000 mg by mouth daily. 1 Tablet Daily     atorvastatin (LIPITOR) 40 MG tablet TAKE 1 TABLET BY MOUTH EVERY DAY 90 tablet 2   cholecalciferol (VITAMIN D3) 25 MCG (1000 UNIT) tablet Take 1,000 Units by mouth daily. 1 Tablet Daily     ELIQUIS 5 MG TABS tablet TAKE 1 TABLET BY MOUTH TWICE DAILY 180 tablet 1   irbesartan (AVAPRO) 150 MG tablet TAKE 1 TABLET(150 MG) BY MOUTH DAILY 90 tablet 3   latanoprost (XALATAN) 0.005 % ophthalmic solution 1 drop at bedtime.     metFORMIN (GLUCOPHAGE) 1000 MG tablet Take 1,000 mg by mouth daily.     Multiple Vitamin (MULTIVITAMIN WITH MINERALS) TABS Take 1 tablet by mouth daily.     furosemide (LASIX) 20 MG tablet TAKE 1 TABLET(20 MG) BY MOUTH DAILY (Patient not taking: No sig reported) 90 tablet 3   No current facility-administered medications for this visit.    REVIEW OF SYSTEMS:  [X]  denotes positive finding, [ ]  denotes negative finding Cardiac  Comments:  Chest pain or chest pressure:    Shortness of breath upon exertion:    Short of breath when lying flat:    Irregular heart rhythm:        Vascular    Pain in calf, thigh, or hip brought on by ambulation: x Left  Pain in feet at night that wakes you up from your sleep:     Blood clot in your veins:    Leg swelling:         Pulmonary    Oxygen at home:    Productive cough:     Wheezing:         Neurologic    Sudden weakness in arms or legs:     Sudden numbness in arms or legs:     Sudden onset of difficulty speaking or slurred speech:    Temporary loss of vision in one eye:     Problems with dizziness:         Gastrointestinal    Blood in stool:     Vomited blood:         Genitourinary    Burning when urinating:     Blood in urine:        Psychiatric    Major depression:          Hematologic    Bleeding problems:    Problems with blood clotting too easily:        Skin    Rashes or ulcers:        Constitutional    Fever or chills:      PHYSICAL EXAM: Vitals:   05/22/21 1334  BP: (!) 160/68  Pulse: (!) 55  Resp: 18  Temp: 98.1 F (36.7 C)  TempSrc: Temporal  SpO2: 94%  Weight: 228 lb (103.4 kg)  Height: 6\' 1"  (1.854 m)    GENERAL: The patient is a well-nourished male, in no acute distress. The vital signs are documented above. CARDIAC: There is a regular rate and rhythm.  VASCULAR:  Palpable femoral pulses bilaterally Left DP 1+ palpable Right DP palpable PULMONARY: No respiratory distress ABDOMEN: Soft and non-tender MUSCULOSKELETAL: There are no major deformities or cyanosis. NEUROLOGIC: No focal weakness or paresthesias are detected. SKIN: There are no ulcers or rashes noted. PSYCHIATRIC: The patient has a normal affect.  DATA:   Bilateral lower extremity arterial duplex 04/11/2021 shows a high-grade proximal right SFA stenosis with a velocity of 411 consistent with 75 to 99% stenosis and on the left has a mid SFA high-grade stenosis with a velocity of 406 consistent with a 75 to 99% stenosis.  ABIs 04/11/2021 1.08 on the right and 0.87 on the left nearly triphasic   Assessment/Plan:  87 year old male presents for evaluation of left lower extremity claudication in the setting of previous aortobiiliac bypass for open repair of an abdominal aortic aneurysm by Dr. 88.  I reviewed his noninvasive imaging from Dr. Hayden Pedro office and discussed that he has infrainguinal disease.  Studies suggest likely a high-grade left SFA stenosis in the mid portion of the artery.  That being said his symptoms are consistent with claudication and I discussed claudication is not typically limb threatening.  We discussed that we should reserve intervention when he feels his symptoms are lifestyle limiting.  Right now he is able to play golf without any symptoms and  can walk up to half a mile without stopping.  Ultimately we agreed on continued conservative management and will arrange follow-up again in 6 months with ABIs.  I gave him my card and discussed he let me know if he feels like things worsen between now and then.  Suspect this would have to be from a brachial approach in the left arm to treat his left SFA as discussed with him today.   Hazle Coca, MD [facility] Office: [phone]

## 2021-05-24 ENCOUNTER — Other Ambulatory Visit: Payer: Self-pay

## 2021-05-24 DIAGNOSIS — I739 Peripheral vascular disease, unspecified: Secondary | ICD-10-CM

## 2021-06-11 ENCOUNTER — Ambulatory Visit: Payer: Medicare Other | Attending: Internal Medicine

## 2021-06-11 ENCOUNTER — Other Ambulatory Visit: Payer: Self-pay

## 2021-06-11 ENCOUNTER — Other Ambulatory Visit (HOSPITAL_BASED_OUTPATIENT_CLINIC_OR_DEPARTMENT_OTHER): Payer: Self-pay

## 2021-06-11 DIAGNOSIS — Z23 Encounter for immunization: Secondary | ICD-10-CM

## 2021-06-11 MED ORDER — PFIZER COVID-19 VAC BIVALENT 30 MCG/0.3ML IM SUSP
INTRAMUSCULAR | 0 refills | Status: DC
  Filled 2021-06-11: qty 0.3, 1d supply, fill #0

## 2021-06-11 NOTE — Progress Notes (Signed)
   Covid-19 Vaccination Clinic  Name:  Derek Wells    MRN: [id number] DOB: 11/21/1932  06/11/2021  Derek Wells was observed post Covid-19 immunization for 15 minutes without incident. He was provided with Vaccine Information Sheet and instruction to access the V-Safe system.   Derek Wells was instructed to call 911 with any severe reactions post vaccine: Difficulty breathing  Swelling of face and throat  A fast heartbeat  A bad rash all over body  Dizziness and weakness

## 2021-07-09 ENCOUNTER — Other Ambulatory Visit (HOSPITAL_BASED_OUTPATIENT_CLINIC_OR_DEPARTMENT_OTHER): Payer: Self-pay

## 2021-07-09 MED ORDER — FLUAD QUADRIVALENT 0.5 ML IM PRSY
PREFILLED_SYRINGE | INTRAMUSCULAR | 0 refills | Status: DC
  Filled 2021-07-09: qty 0.5, 1d supply, fill #0

## 2021-07-16 ENCOUNTER — Ambulatory Visit: Payer: Medicare Other | Admitting: Cardiovascular Disease

## 2021-07-30 ENCOUNTER — Other Ambulatory Visit: Payer: Self-pay | Admitting: Student

## 2021-08-12 ENCOUNTER — Other Ambulatory Visit (HOSPITAL_COMMUNITY): Payer: Self-pay | Admitting: Student

## 2021-08-13 NOTE — Telephone Encounter (Signed)
Eliquis 5 mg refill request received. Patient is 87 years old, weight-103.4 kg, Crea-1.5 on 02/01/21.  Diagnosis-afib, and last seen by Dr. Allyson Sabal on 05/15/21. Dose is appropriate based on dosing criteria. Will send in refill to requested pharmacy.

## 2021-08-27 DIAGNOSIS — H00011 Hordeolum externum right upper eyelid: Secondary | ICD-10-CM | POA: Diagnosis not present

## 2021-08-27 DIAGNOSIS — H0100A Unspecified blepharitis right eye, upper and lower eyelids: Secondary | ICD-10-CM | POA: Diagnosis not present

## 2021-09-11 ENCOUNTER — Other Ambulatory Visit: Payer: Self-pay | Admitting: Cardiology

## 2021-09-14 DIAGNOSIS — E119 Type 2 diabetes mellitus without complications: Secondary | ICD-10-CM | POA: Diagnosis not present

## 2021-09-14 DIAGNOSIS — H401134 Primary open-angle glaucoma, bilateral, indeterminate stage: Secondary | ICD-10-CM | POA: Diagnosis not present

## 2021-09-14 DIAGNOSIS — H43812 Vitreous degeneration, left eye: Secondary | ICD-10-CM | POA: Diagnosis not present

## 2021-09-18 DIAGNOSIS — Z20822 Contact with and (suspected) exposure to covid-19: Secondary | ICD-10-CM | POA: Diagnosis not present

## 2021-09-18 DIAGNOSIS — R051 Acute cough: Secondary | ICD-10-CM | POA: Diagnosis not present

## 2021-09-27 ENCOUNTER — Ambulatory Visit: Payer: Medicare Other | Admitting: Cardiovascular Disease

## 2021-09-27 ENCOUNTER — Encounter: Payer: Self-pay | Admitting: Cardiovascular Disease

## 2021-09-27 ENCOUNTER — Other Ambulatory Visit: Payer: Self-pay

## 2021-09-27 DIAGNOSIS — U071 COVID-19: Secondary | ICD-10-CM

## 2021-09-27 DIAGNOSIS — I739 Peripheral vascular disease, unspecified: Secondary | ICD-10-CM

## 2021-09-27 DIAGNOSIS — E785 Hyperlipidemia, unspecified: Secondary | ICD-10-CM

## 2021-09-27 DIAGNOSIS — Z951 Presence of aortocoronary bypass graft: Secondary | ICD-10-CM | POA: Diagnosis not present

## 2021-09-27 DIAGNOSIS — I2510 Atherosclerotic heart disease of native coronary artery without angina pectoris: Secondary | ICD-10-CM | POA: Diagnosis not present

## 2021-09-27 DIAGNOSIS — I350 Nonrheumatic aortic (valve) stenosis: Secondary | ICD-10-CM | POA: Diagnosis not present

## 2021-09-27 DIAGNOSIS — Z9889 Other specified postprocedural states: Secondary | ICD-10-CM

## 2021-09-27 DIAGNOSIS — I10 Essential (primary) hypertension: Secondary | ICD-10-CM

## 2021-09-27 DIAGNOSIS — Z8679 Personal history of other diseases of the circulatory system: Secondary | ICD-10-CM

## 2021-09-27 DIAGNOSIS — R0602 Shortness of breath: Secondary | ICD-10-CM

## 2021-09-27 DIAGNOSIS — Z7901 Long term (current) use of anticoagulants: Secondary | ICD-10-CM

## 2021-09-27 DIAGNOSIS — Z79899 Other long term (current) drug therapy: Secondary | ICD-10-CM

## 2021-09-27 DIAGNOSIS — E1159 Type 2 diabetes mellitus with other circulatory complications: Secondary | ICD-10-CM

## 2021-09-27 MED ORDER — EZETIMIBE 10 MG PO TABS: 10.0000 mg | ORAL_TABLET | Freq: Every day | ORAL | 6 refills | Status: DC

## 2021-09-27 NOTE — Patient Instructions (Signed)
Medication Instructions:  START ZETIA 10MG  DAILY  *If you need a refill on your cardiac medications before your next appointment, please call your pharmacy*  Lab Work:    IN 6 WEEKS CMET, A1C, CBC, TSH, FASTING LIPID      Special Instructions Echocardiogram - Your physician has requested that you have an echocardiogram. Echocardiography is a painless test that uses sound waves to create images of your heart. It provides your doctor with information about the size and shape of your heart and how well your hearts chambers and valves are working. This procedure takes approximately one hour. There are no restrictions for this procedure. This will be performed at either our [facility] location - [address]  -or- [facility] location [address] 2nd floor.  Follow-Up: Your next appointment:  3 month(s) In Person with [employer], MD    :1  At [facility], you and your health needs are our priority.  As part of our continuing mission to provide you with exceptional heart care, we have created designated Provider Care Teams.  These Care Teams include your primary Cardiologist (physician) and Advanced Practice Providers (APPs -  Physician Assistants and Nurse Practitioners) who all work together to provide you with the care you need, when you need it.  We recommend signing up for the patient portal called "MyChart".  Sign up information is provided on this After Visit Summary.  MyChart is used to connect with patients for Virtual Visits (Telemedicine).  Patients are able to view lab/test results, encounter notes, upcoming appointments, etc.  Non-urgent messages can be sent to your provider as well.   To learn more about what you can do with MyChart, go to [facility].

## 2021-09-27 NOTE — Progress Notes (Signed)
Patient ID: Derek Wells, male   DOB: 06/28/1933, 88 y.o.   MRN: [id number]     HPI: Derek Wells is a 87 y.o. male who presents to the office today for a 7 month followup cardiology evaluation.   Derek Wells has a history of hypertension, hyperlipidemia, and is s/p abdominal aortic aneurysm repair in April 2010. I had seen him on 01/14/2013 at which time he had experienced symptoms worrisome for unstable angina. Previously, in September 2013 he had undergone a nuclear perfusion study  which was essentially normal although did show mild diaphragmatic attenuation. He also been found to have a atherogenic dyslipidemia pattern with reference to his lipid panel. Catheterization the following day on 01/15/2013 revealed life-threatening coronary anatomy with 60-70% ostial left main stenosis, 90-95% distal left main stenosis, 95-99% ostial LAD stenosis, and mild/moderate stenoses in the RCA. That same day he was successfully operated by Dr. Arvid Right and underwent CABG x3 with a LIMA to the LAD, SVG to the OM, and SVG to the PDA. He had a unremarkable postoperative course and was discharged 4 days later.  He saw [name] on 01/27/13 and was doing well. I saw him in followup on 03/02/2013 and he  participating  in cardiac rehabilitation. When I saw him in September 2014, he was in sinus rhythm and has and had resumed a good level of activity.   In January 2015, he was found to be in atrial flutter at cardiac rehabilitation. He was unaware of his heart rhythm being abnormal. He had developed a URI over the holidays prior to that and had just completed a steroid dose pack. Xarelto 20 mg was added to his medical regimen. He did undergo subsequent blood work on January 20 which showed normal renal function and normal electrolytes. Liver function studies were normal. Hemoglobin was 13.7 hematocrit 41.7. Platelets were normal. TSH was normal at 2.63. Magnesium was normal at 2.0.  He underwent successful cardioversion for  his atrial flutter on 11/12/2013 and he cardioverted to sinus rhythm at 100 J.  Since his cardioversion, he is unaware of any recurrent rhythm disturbance.   He is in the maintenance phase of cardiac rehabilitation.  When I  saw him in June 2015, he told me that he had noticed several episodes of a vague chest sensation particularly when he rolls his garbage can back up a hill at home.    On 03/18/2014, he underwent a nuclear perfusion study, which showed reduced exercise capacity with a workload of only 5.9.  He did experience mild shortness of breath.  There was no significant ST depression, but he had developed transient left bundle branch block.  The scan was interpreted at low as low risk.  Ejection fraction was 65% without wall motion abnormalities.  An echo Doppler study showed an EF of 60-65%.  There were no regional wall motion abnormalities.  He had mild biatrial enlargement.  Derek Wells currently is playing golf at least 2 times per week and occasionally 3.  He is in the maintenance phase of cardiac rehabilitation.  He does admit to some weight gain.  He denies any episodes of chest pain, particularly during the cardiac rehabilitation program.  He has noticed some mild shortness of breath with deep breathing and some vague pressure in his chest which is short-lived walking up hills when he walks fast with his wife.  He has noticed some mild shortness of breath walking up steep hills in the golf course without chest pressure.  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 2016.  When I saw him in 2017 he was anemic and had significantly reduced iron saturation at 6%, and his serum ferritin was 7.  I referred him for GI evaluation and underwent both colonoscopy and endoscopy by Dr. [name] and 4 polyps were removed from his colon.  He was not having any active bleeding.  He does note improved energy.  He had taken Niferex initially and transitioned to over-the-counter iron. He  has more energy.  He is continuing with cardiac rehabilitation.  He underwent a follow-up abdominal ultrasound on 06/09/2015 which showed a patent aortobifemoral bypass graft without focal dilatation or stenosis.   In the late summer of 2017 he was at the beach for several weeks and played golf heat without chest pain.  However with fast walking he noted chest pressure.  For the past month he has had a cough.  He has a ventral hernia.  When I saw him, his ECG showed progressive PR prolongation.  I reduced his Lopressor back to 25 mg daily and added amlodipine 5 mg blood pressure and his chest tightness.  I scheduled him for a nuclear perfusion study which was done on 07/23/2016.  Ejection fraction was 62%.  There were no ECG changes ischemia, but there was a rare PVC.  He had normal perfusion without scar or ischemia.  His follow-up laboratory showed significant improvement with his iron saturation, now at 27%.  Lipid studies revealed a total cholesterol 164, triglycerides 122, HDL 40, and LDL 100.    When I saw him in January 2018, I suggested the addition of Zetia to his atorvastatin.  In the past.  He did not tolerate 80 mg, atorvastatin, and also had myalgias on 40 mg.  I also discussed plaque regression studies with Repatha.  He developed recurrent atrial fibrillation and had multiple evaluations in the A. fib clinic commencing in July 2018.  Ultimately, on 05/13/2017, he underwent successful cardioversion with restoration of sinus rhythm.  He saw [name] in follow-up of his cardioversion and was maintaining sinus rhythm.  There was discussion concerning his EtOH use and reduction in consumption.  He typically has at least 1 or more vodka every day.    When I lsaw him in February 2019 he was continuing to feel well and denied any recurrent episodes of atrial fibrillation.  His last echo Doppler study in October 2018 showed normal LV systolic and diastolic function.  He had biatrial enlargement with  mild aortic valve stenosis, and mean gradient of 14 and peak gradient of 25.  On follow-up abdominal aortic Doppler study the largest aortic measurement was 2.7 cm.   He was playing golf several days per week and was continue to participate in cardiac rehab.  On the evening of May 12, 2018 he was going up steps to bed developed an episode of full sensation in his throat chest with some radiate this was associated with palpitations.  Symptoms lasted approximately 10 minutes.  He was seen in the following morning relation clinic and was found to be in atrial flutter with a controlled ventricular rate.  He was scheduled to undergo DC cardioversion but upon arrival for his cardioversion procedure his ECG showed that he had converted back to sinus rhythm and had PACs.  I last saw on May 29, 2018.  He admitted to occasional lightheadedness and sensation of head fog.  He denied any exertional chest tightness.  He was continuing to drink occasional vodka but admits that his drinking is less.  He was sleeping well.  He denied any significant snoring or daytime sleepiness.  Elder Love revealed normal thyroid function studies.  History was normal although glucose was minimally increased at 103.  CBC was normal.  LDL cholesterol was 71 but triglycerides are elevated at 173 total cholesterol 143 and HDL 37.  Because of increased cardiac murmur on auscultation he underwent a follow-up echo Doppler study in June 25, 2018.  This continued to show excellent ejection fraction at 60 to 65% with grade 2 diastolic dysfunction.  There was mild aortic valve stenosis with a mean gradient of 14, peak instantaneous gradient of 29, and calculated valve area of 1.6 8 cm.  When I saw him last on July 07, 2018 he was maintaining sinus rhythm and was without chest pain PND orthopnea although he still noted mild shortness of breath when bending over.  He was playing golf several days per week.    Since I last saw him in October  2019  he apparently has had recurrent issues with atrial fibrillation.  He has been seen by Kerin Ransom, PA, Almyra Deforest, [place],  [name], NP and Coletta Memos, NP.  Apparently he has been in longstanding persistent atrial fibrillation for well over a year.  He has been on chronic anticoagulation with Eliquis.  When I saw him in April 2021 after not having seen him in 18 months he denied any recurrent anginal symptomatology.  He was continuing to play golf 2 days/week and had experienced some shortness of breath with uphill walking.  He was walking his dog daily.  Since he had not had an echo in over 2 years I recommended a follow-up echo Doppler study which was done on 02/14/2020.  This revealed an EF of 60 to 65%.  He had mildly increased pulmonary artery systolic pressure 22.4 mm.  There was evidence for mild aortic valve stenosis with a mean gradient of 13.8 and peak gradient of 22.8.  Pulmonary artery systolic pressure was mildly increased at 36.5.  He had normal atrial dimensions.    I saw him in October 2021.  Unfortunately, recently his wife broke her hip and required hip surgery and rehab therapy.  He has persistent atrial fibrillation and has been on Eliquis 5 mg twice a day.  At that time, his ECG showed atrial fibrillation at 54 bpm.  He continued to be on amlodipine 2.5 mg, irbesartan 150 mg and furosemide 20 mg daily.  He is on atorvastatin 40 mg for hyperlipidemia.  He is on Metformin 500 mg daily.    He underwent an echo Doppler study on January 08, 2021 which showed an EF of 55 to 60% without wall motion abnormalities.  There was mild biatrial enlargement.  There was mild aortic stenosis with a mean gradient of 14 mm and estimated AVA at 1.65 cm.  Abdominal aorta Doppler study on January 05, 2021 showed moderate atherosclerosis throughout the iliac arteries.  The exam was limited due to overlying bowel.  There was no evidence for obvious recurrent aneurysm.  Since I last saw him, he had called  our office in May 2022 with reports of bradycardia with rates in the 40s as well as fatigability.  He was evaluated by Sande Rives on Feb 01, 2021.  He reported heart rates in the 40s with significant fatigability.  He subsequently wore a Zio patch monitor from May 9 through May 23.  He was in atrial fibrillation throughout the monitor with rates ranging from the lowest at 32 on May 16 at 2:21 AM and the highest at 63 at 12:45 PM on May 10 with an average rate of 50 bpm.  He had bundle branch block/IVCD D.  There are occasional isolated PVCs without couplets or triplets.  I last saw him on March 19, 2021 he continued to have periods of fatigability.    Typically his heart rate is in the low to mid 40s.  However on June 18 at 10:43 AM he reported his heart rate was around 38 bpm.  He also has noted some left calf claudication symptomatology.  He admits to decreased energy.  He still playing golf 2 days/week.  He denies recurrent chest pain.  During that evaluation, his blood pressure was stable on amlodipine 5 mg, irbesartan 150 mg and he was taking furosemide 20 mg daily.  There were no signs of volume overload and I suggested he try decreasing furosemide to every other day or as needed.  With his progressive left calf claudication symptomatology I recommended a lower extremity arterial Doppler study.  He underwent his Doppler study which raise concern for progressive PVD and he was referred to Dr. Gwenlyn Found.  He subsequently was evaluated by Dr. Monica Martinez of [facility] since Dr. Kellie Simmering had previously done his open AAA repair with aorto by iliac bypass.  Initially, a conservative approach was recommended with plans for follow-up in approximately 6 months.  If intervention is to be performed a brachial approach in the left arm to treat his left SFA high-grade stenosis would be necessary.  Presently, Derek Wells denies any chest pain.  He continues to be in longstanding persistent atrial fibrillation.  He denies  presyncope or syncope.  His wife had recently tested positive for COVID before Christmas and he tested positive as well and received treatment with Paxlovid and tested -2 days prior to this evaluation.  He presents for evaluation   Past Medical History:  Diagnosis Date   CAD (coronary artery disease), severe needing CABG 01/19/2013   Chest pain    2D ECHO, 06/16/2012 - EF 50-55%, borderline low left ventricular systolic function   Coronary artery disease    Dyspnea on exertion    LEXISCAN, 06/17/2012 - fixed inferior bowel artifact and basal septal defect, probably related to IVCD/incomplete LBBB, no reversible ischemia, post-stress EF 66%   History of AAA (abdominal aortic aneurysm) repair    ABDOMINAL AORTIC DOPPLER, 06/16/2013 - normal   HTN (hypertension) 01/19/2013   Hyperlipidemia LDL goal < 70 01/19/2013   Hypertension    S/P CABG x 3 01/19/2013    Past Surgical History:  Procedure Laterality Date   CARDIAC CATHETERIZATION  01/15/2013   Severe coronary obstructive disease with 60-70% ostial left main and 90-85% distal LAD stenosis and mild-moderate stenoses in RCA, recommended for CABG   CARDIOVERSION N/A 11/12/2013   Procedure: CARDIOVERSION;  Surgeon: Troy Sine, MD;  Location: [facility] ENDOSCOPY;  Service: Cardiovascular;  Laterality: N/A;  11:16 Lido 81m, Propofol 1270mIV 11:19 synch 100 joules conversion conversion to NSR....   CARDIOVERSION N/A 05/13/2017   Procedure: CARDIOVERSION;  Surgeon: NaAcie FredricksonPhWonda ChengMD;  [facility];  Service: Cardiovascular;  Laterality: N/A;   CORONARY ARTERY BYPASS GRAFT N/A 01/15/2013   Procedure: Coronary Artery Bypass Graft times three using Right Greater Saphenous Vein Graft harvested endoscopically and Left Internal Mammary Artery and;  Surgeon: BrGaye PollackMD;  [place]  Service: Open Heart Surgery;  Laterality: N/A;   INTRAOPERATIVE TRANSESOPHAGEAL ECHOCARDIOGRAM  01/15/2013   Procedure: INTRAOPERATIVE TRANSESOPHAGEAL  ECHOCARDIOGRAM;  Surgeon: Gaye Pollack, MD;  Location: [facility] OR;  Service: Open Heart Surgery;;   LEFT HEART CATHETERIZATION WITH CORONARY ANGIOGRAM N/A 01/15/2013   Procedure: LEFT HEART CATHETERIZATION WITH CORONARY ANGIOGRAM;  Surgeon: Troy Sine, MD;  Location: [facility] CATH LAB;  Service: Cardiovascular;  Laterality: N/A;   VASCULAR SURGERY      Allergies  Allergen Reactions   Penicillins Rash    Has patient had a PCN reaction causing immediate rash, facial/tongue/throat swelling, SOB or lightheadedness with hypotension: no Has patient had a PCN reaction causing severe rash involving mucus membranes or skin necrosis: no Has patient had a PCN reaction that required hospitalization: no Has patient had a PCN reaction occurring within the last 10 years: yes If all of the above answers are "NO", then may proceed with Cephalosporin use.     Current Outpatient Medications  Medication Sig Dispense Refill   amLODipine (NORVASC) 5 MG tablet TAKE 1 TABLET(5 MG) BY MOUTH DAILY 90 tablet 1   Ascorbic Acid (VITAMIN C) 1000 MG tablet Take 1,000 mg by mouth daily. 1 Tablet Daily     atorvastatin (LIPITOR) 40 MG tablet TAKE 1 TABLET BY MOUTH EVERY DAY 90 tablet 2   cholecalciferol (VITAMIN D3) 25 MCG (1000 UNIT) tablet Take 1,000 Units by mouth daily. 1 Tablet Daily     COVID-19 mRNA bivalent vaccine, Pfizer, (PFIZER COVID-19 VAC BIVALENT) injection Inject into the muscle. 0.3 mL 0   ELIQUIS 5 MG TABS tablet TAKE 1 TABLET BY MOUTH TWICE DAILY 180 tablet 1   ezetimibe (ZETIA) 10 MG tablet Take 1 tablet (10 mg total) by mouth daily. 30 tablet 6   influenza vaccine adjuvanted (FLUAD QUADRIVALENT) 0.5 ML injection Inject into the muscle. 0.5 mL 0   irbesartan (AVAPRO) 150 MG tablet TAKE 1 TABLET(150 MG) BY MOUTH DAILY 90 tablet 3   latanoprost (XALATAN) 0.005 % ophthalmic solution 1 drop at bedtime.     metFORMIN (GLUCOPHAGE) 1000 MG tablet Take 1,000 mg by mouth daily.     Multiple Vitamin (MULTIVITAMIN  WITH MINERALS) TABS Take 1 tablet by mouth daily.     furosemide (LASIX) 20 MG tablet TAKE 1 TABLET(20 MG) BY MOUTH DAILY (Patient not taking: Reported on 09/27/2021) 90 tablet 3   No current facility-administered medications for this visit.    Socially he is re-married. His first wife of 42 years passed away. He previously had smoked cigars prior to his bypass surgery but has not had any tobacco use since.  He continues to play golf at least 2 times per week.  He does a lot of leg exercise with cardiac rehabilitation.  ROS General: Negative; No fevers, chills, or night sweats; fatigability HEENT: Negative; No changes in vision or hearing, sinus congestion, difficulty swallowing Pulmonary: URI symptoms during the winter; No cough, wheezing, shortness of breath, hemoptysis Cardiovascular: Mild shortness of breath with uphill walking; see history of present illness left calf claudication with high-grade proximal right SFA stenosis and left mid SFA stenoses. GI: Negative; No nausea, vomiting, diarrhea, or abdominal pain GU: Negative; No dysuria, hematuria, or difficulty voiding Musculoskeletal: Negative; no myalgias, joint pain, or weakness Hematologic/Oncology: Negative; no easy bruising, bleeding Endocrine: Negative; no heat/cold intolerance; no diabetes Neuro: Negative; no changes in balance, headaches Skin: Removal of small left forearm melanoma. Psychiatric: Negative; No behavioral problems, depression Sleep: Negative; No snoring, daytime sleepiness, hypersomnolence,  bruxism, restless legs, hypnogognic hallucinations, no cataplexy Other comprehensive 14 point system review is negative.   PE BP (!) 181/67    Pulse (!) 53    Ht 6' 1.5" (1.867 m)    Wt 232 lb 12.8 oz (105.6 kg)    SpO2 97%    BMI 30.30 kg/m    Repeat blood pressure by me was 140/64  Wt Readings from Last 3 Encounters:  09/27/21 232 lb 12.8 oz (105.6 kg)  05/22/21 228 lb (103.4 kg)  05/15/21 233 lb (105.7 kg)    General: Alert, oriented, no distress.  Skin: normal turgor, no rashes, warm and dry HEENT: Normocephalic, atraumatic. Pupils equal round and reactive to light; sclera anicteric; extraocular muscles intact;  Nose without nasal septal hypertrophy Mouth/Parynx benign; Mallinpatti scale 3 Neck: No JVD, no carotid bruits; normal carotid upstroke Lungs: clear to ausculatation and percussion; no wheezing or rales Chest wall: without tenderness to palpitation Heart: PMI not displaced, RRR, s1 s2 normal, 2/6 systolic murmur in the aortic area, no diastolic murmur, no rubs, gallops, thrills, or heaves Abdomen: soft, nontender; no hepatosplenomehaly, BS+; abdominal aorta nontender and not dilated by palpation. Back: no CVA tenderness Pulses: decreased pulses lower extremity  Musculoskeletal: full range of motion, normal strength, no joint deformities Extremities: no clubbing cyanosis or edema, Homan's sign negative  Neurologic: grossly nonfocal; Cranial nerves grossly wnl Psychologic: Normal mood and affect    September 27, 2021 ECG (independently read by me):  Atrial fibrillation at 53, LBBB  March 19, 2021 ECG (independently read by me): Atrial fibrillation at 47; LAD, QTc 430 msec  October 2021 ECG (independently read by me): Atrial fibrillation at 54; PRWP; QTc 428 bmsec  April 2021 ECG (independently read by me): Atrial fibrillation with an average ventricular rate of 53 bpm with a range of in the 60s to 40s.  One isolated PVC.  Nonspecific interventricular block.  July 07, 2020 ECG (independently read by me): Sinus bradycardia with first-degree AV block.  PR interval 392 ms.  Incomplete left bundle branch block.  May 29, 2018 ECG (independently read by me): Sinus rhythm with first-degree AV block at 72 bpm with a PR interval of 312 ms.  Occasional PACs.  February 2019 ECG (independently read by me): Sinus rhythm at 63 bpm.  First-degree AV block with a PR interval at 344 ms.  PAC.   Nonspecific interventricular conduction delay.  October 2018 ECG (independently read by me): Sinus rhythm with first-degree AV block with a PR interval of 314 ms.  Nonspecific interventricular block.  QTc interval 431  January 2018 ECG (independently read by me): Normal sinus rhythm with first-degree AV block.  PR interval 300 ms.  Nonspecific interventricular conduction delay.  October 2017 ECG (independently read by me): Normal sinus rhythm at 66 bpm.  First-degree block with a PR interval at 290 ms.  Nonspecific interventricular block.  03/02/2015 ECG (independently read by me): Sinus rhythm at 75 bpm.  Occasional unifocal PVCs.  Nonspecific interventricular block.  Prior October 2015 ECG (independently read by me): Normal sinus rhythm at 67 beats per minute.  First degree A-V block with PR interval at 290 ms.  QTc interval 439 ms.  No ST segment changes.  Prior June 2015 ECG (independently read by me): Normal sinus rhythm at 68 beats per minute with first degree AV block with a PR interval at 284 ms.  QTc interval 435 ms.  No significant ST-T changes.  T-wave inversion in aVL  ECG today (independently  read by me):  Normal sinus rhythm at 71 beats per minute. First create a block with PR interval at 240 ms. QTc interval normal at 4-5 ms.  Prior 10/27/13 ECG (independently read by me): Atrial flutter with variable rate but predominantly 4-1 with occasional 3-1 block; average heart rate 70 beats per minute  Prior ECG from 06/04/2013: Sinus rhythm with first-degree AV block. Nonspecific interventricular block  LABS: BMP Latest Ref Rng & Units 02/01/2021 01/10/2020 05/10/2019  Glucose 65 - 99 mg/dL 95 99 107(H)  BUN 8 - 27 mg/dL '20 13 15  ' Creatinine 0.76 - 1.27 mg/dL 1.05 0.90 0.96  BUN/Creat Ratio 10 - '24 19 14 16  ' Sodium 134 - 144 mmol/L 140 144 141  Potassium 3.5 - 5.2 mmol/L 4.7 4.7 4.8  Chloride 96 - 106 mmol/L 103 105 104  CO2 20 - 29 mmol/L '24 21 22  ' Calcium 8.6 - 10.2 mg/dL 9.9 9.6  8.9   Hepatic Function Latest Ref Rng & Units 02/01/2021 01/10/2020 05/10/2019  Total Protein 6.0 - 8.5 g/dL 7.1 6.9 6.6  Albumin 3.6 - 4.6 g/dL 4.4 4.3 4.4  AST 0 - 40 IU/L '15 16 16  ' ALT 0 - 44 IU/L '18 16 13  ' Alk Phosphatase 44 - 121 IU/L 83 78 69  Total Bilirubin 0.0 - 1.2 mg/dL 0.9 1.1 0.8  Bilirubin, Direct 0.00 - 0.40 mg/dL - - -   CBC Latest Ref Rng & Units 03/19/2021 01/10/2020 05/10/2019  WBC 3.4 - 10.8 x10E3/uL 9.0 7.4 8.0  Hemoglobin 13.0 - 17.7 g/dL 13.1 13.8 12.1(L)  Hematocrit 37.5 - 51.0 % 40.7 41.6 38.0  Platelets 150 - 450 x10E3/uL 259 228 258   Lab Results  Component Value Date   MCV 83 03/19/2021   MCV 87 01/10/2020   MCV 83 05/10/2019   Lab Results  Component Value Date   TSH 2.330 02/01/2021   Lipid Panel     Component Value Date/Time   CHOL 170 03/19/2021 1200   CHOL 111 03/01/2015 1033   TRIG 86 03/19/2021 1200   TRIG 90 03/01/2015 1033   HDL 51 03/19/2021 1200   HDL 41 03/01/2015 1033   CHOLHDL 3.3 03/19/2021 1200   CHOLHDL 4.1 07/19/2016 0949   VLDL 24 07/19/2016 0949   LDLCALC 103 (H) 03/19/2021 1200   LDLCALC 52 03/01/2015 1033     RADIOLOGY: No results found.  IMPRESSION:  1. CAD in native artery   2. Hx of CABG   3. Essential hypertension   4. Nonrheumatic aortic valve stenosis   5. Hyperlipidemia with target LDL less than 70   6. S/P AAA repair   7. Claudication ([place])   8. COVID   9. Type 2 diabetes mellitus with other circulatory complication, without long-term current use of insulin ([place])   10. Anticoagulation adequate   11. Medication management      ASSESSMENT AND PLAN: Derek Wells is an active 88 year old white male who underwent emergent CABG revascularization surgery after cardiac catheterization on 01/15/2013 demonstrated severe life threatening coronary anatomy.  He  developed atrial flutter in January 2015 and was started on anticoagulation with Xarelto and underwent cardioversion.  He developed recurrent episodes of atrial  fibrillation in 2018 and had maintained sinus rhythm since his repeat cardioversion on 05/13/2017 but developed recurrent atrial flutter 1 year later in May 13, 2018 was found to have a flutter with controlled rate.  When seen in October 2019 he was maintaining sinus rhythm.  However since that time  he has been seen on multiple occasions by numerous providers and he is now felt to have longstanding persistent atrial fibrillation.  He has been maintained on Eliquis for anticoagulation. An echo Doppler study in 2019 showed normal EF at 60 to 65% with a mean aortic gradient of 14 consistent with mild aortic stenosis.  He had grade 2 diastolic dysfunction.  A 2-year follow-up echo Doppler in May 2021 continued to show normal systolic function.  Aortic stenosis remains mild with a mean gradient of 13.8 and peak gradient of 22.8 mmHg.  There was mild pulmonary hypertension with a PA systolic pressure of 96.2.  His most recent echo Doppler study from April 2022 remained stable and was not significantly changed from prior evaluation and demonstrated normal systolic function with mild biatrial enlargement, and mild aortic stenosis with a mean gradient of 14 and estimated AVA of 1.65 cm.  Derek Wells has longstanding persistent atrial fibrillation and over the last several years his ventricular rate has slowly decreased.  Presently, his ventricular rate is in the 50s but in the past he has been noted to be in the mid 40s and on ZIO patch monitoring his slowest heart rate was 32.  Upon presenting to the office today he was significantly hypertensive but this had markedly improved on recheck by me and was 140/64.  He continues to be on amlodipine 5 mg, irbesartan 150 mg for blood pressure control.  He has been taking Lasix 20 mg.  He is anticoagulated on Eliquis.  He continues to be on atorvastatin for hyperlipidemia and in the past did not tolerate 80 mg dosing.  He has extensive history of PVD and underwent open AAA repair  with aortobiiliac bypass surgery by Dr. Kellie Simmering remotely.  He has now developed left calf claudication Doppler imaging on April 11, 2021 has demonstrated high-grade proximal right SFA stenosis with a velocity of 411 consistent with 75 to 99% stenosis and on the left and mid SFA high-grade stenosis with a velocity of 406 also consistent with 75 to 99% stenosis.  ABIs were 1.08 on the right and 0.87 on the left.  He was evaluated by Dr. Franchot Gallo and since his symptoms numbers were not very severe and initial conservative management was recommended.  However if increasing symptomatology develops he would require a brachial approach in the left arm to treat his left SFA stenosis.  I discussed aggressive lipid-lowering therapy.  In the past he said stated that he did not think he tolerated Zetia.  I have suggested a retrial of Zetia 10 mg particularly since he could not tolerate 80 mg of atorvastatin.  If he cannot reach target less than 70 he is a candidate for PCSK9 inhibition with Repatha.  He is diabetic on metformin.  Repeat laboratory will be obtained with a comprehensive metabolic panel, hemoglobin A1c, CBC, TSH and lipid studies in 6 weeks.  I will see him in 3 months for reevaluation.    Troy Sine, MD, [facility]  09/29/2021 3:55 PM

## 2021-09-28 DIAGNOSIS — R0989 Other specified symptoms and signs involving the circulatory and respiratory systems: Secondary | ICD-10-CM | POA: Diagnosis not present

## 2021-09-29 ENCOUNTER — Encounter: Payer: Self-pay | Admitting: Cardiovascular Disease

## 2021-10-18 DIAGNOSIS — Z0000 Encounter for general adult medical examination without abnormal findings: Secondary | ICD-10-CM | POA: Diagnosis not present

## 2021-10-18 DIAGNOSIS — E785 Hyperlipidemia, unspecified: Secondary | ICD-10-CM | POA: Diagnosis not present

## 2021-10-18 DIAGNOSIS — I10 Essential (primary) hypertension: Secondary | ICD-10-CM | POA: Diagnosis not present

## 2021-10-18 DIAGNOSIS — I4892 Unspecified atrial flutter: Secondary | ICD-10-CM | POA: Diagnosis not present

## 2021-10-18 DIAGNOSIS — R972 Elevated prostate specific antigen [PSA]: Secondary | ICD-10-CM | POA: Diagnosis not present

## 2021-10-18 DIAGNOSIS — I25119 Atherosclerotic heart disease of native coronary artery with unspecified angina pectoris: Secondary | ICD-10-CM | POA: Diagnosis not present

## 2021-10-18 DIAGNOSIS — Z1389 Encounter for screening for other disorder: Secondary | ICD-10-CM | POA: Diagnosis not present

## 2021-10-18 DIAGNOSIS — D509 Iron deficiency anemia, unspecified: Secondary | ICD-10-CM | POA: Diagnosis not present

## 2021-10-26 ENCOUNTER — Other Ambulatory Visit: Payer: Self-pay

## 2021-10-26 ENCOUNTER — Ambulatory Visit (HOSPITAL_COMMUNITY): Payer: Medicare Other | Attending: Cardiovascular Disease

## 2021-10-26 DIAGNOSIS — Z951 Presence of aortocoronary bypass graft: Secondary | ICD-10-CM | POA: Diagnosis not present

## 2021-10-26 DIAGNOSIS — I350 Nonrheumatic aortic (valve) stenosis: Secondary | ICD-10-CM | POA: Insufficient documentation

## 2021-10-26 DIAGNOSIS — U071 COVID-19: Secondary | ICD-10-CM | POA: Diagnosis not present

## 2021-10-26 LAB — ECHOCARDIOGRAM COMPLETE
AR max vel: 2.03 cm2
AV Area VTI: 1.76 cm2
AV Area mean vel: 2.04 cm2
AV Mean grad: 20 mmHg
AV Peak grad: 35 mmHg
Ao pk vel: 2.96 m/s
Area-P 1/2: 2.94 cm2
P 1/2 time: 551 msec
S' Lateral: 2.2 cm

## 2021-11-08 DIAGNOSIS — I25119 Atherosclerotic heart disease of native coronary artery with unspecified angina pectoris: Secondary | ICD-10-CM | POA: Diagnosis not present

## 2021-11-08 DIAGNOSIS — Z951 Presence of aortocoronary bypass graft: Secondary | ICD-10-CM | POA: Diagnosis not present

## 2021-11-08 DIAGNOSIS — R0602 Shortness of breath: Secondary | ICD-10-CM | POA: Diagnosis not present

## 2021-11-08 DIAGNOSIS — I350 Nonrheumatic aortic (valve) stenosis: Secondary | ICD-10-CM | POA: Diagnosis not present

## 2021-11-08 DIAGNOSIS — D509 Iron deficiency anemia, unspecified: Secondary | ICD-10-CM | POA: Diagnosis not present

## 2021-11-08 DIAGNOSIS — K921 Melena: Secondary | ICD-10-CM | POA: Diagnosis not present

## 2021-11-08 DIAGNOSIS — I10 Essential (primary) hypertension: Secondary | ICD-10-CM | POA: Diagnosis not present

## 2021-11-08 DIAGNOSIS — U071 COVID-19: Secondary | ICD-10-CM | POA: Diagnosis not present

## 2021-11-08 LAB — TSH: TSH: 1.97 u[IU]/mL (ref 0.450–4.500)

## 2021-11-08 LAB — CBC
Hematocrit: 39.9 % (ref 37.5–51.0)
Hemoglobin: 12.7 g/dL — ABNORMAL LOW (ref 13.0–17.7)
MCH: 25.7 pg — ABNORMAL LOW (ref 26.6–33.0)
MCHC: 31.8 g/dL (ref 31.5–35.7)
MCV: 81 fL (ref 79–97)
Platelets: 248 10*3/uL (ref 150–450)
RBC: 4.95 x10E6/uL (ref 4.14–5.80)
RDW: 16 % — ABNORMAL HIGH (ref 11.6–15.4)
WBC: 9.1 10*3/uL (ref 3.4–10.8)

## 2021-11-08 LAB — COMPREHENSIVE METABOLIC PANEL
ALT: 18 IU/L (ref 0–44)
AST: 18 IU/L (ref 0–40)
Albumin/Globulin Ratio: 1.8 (ref 1.2–2.2)
Albumin: 4.4 g/dL (ref 3.6–4.6)
Alkaline Phosphatase: 77 IU/L (ref 44–121)
BUN/Creatinine Ratio: 15 (ref 10–24)
BUN: 14 mg/dL (ref 8–27)
Bilirubin Total: 1.1 mg/dL (ref 0.0–1.2)
CO2: 23 mmol/L (ref 20–29)
Calcium: 9.4 mg/dL (ref 8.6–10.2)
Chloride: 103 mmol/L (ref 96–106)
Creatinine, Ser: 0.93 mg/dL (ref 0.76–1.27)
Globulin, Total: 2.4 g/dL (ref 1.5–4.5)
Glucose: 93 mg/dL (ref 70–99)
Potassium: 4.9 mmol/L (ref 3.5–5.2)
Sodium: 140 mmol/L (ref 134–144)
Total Protein: 6.8 g/dL (ref 6.0–8.5)
eGFR: 80 mL/min/{1.73_m2} (ref >59)

## 2021-11-08 LAB — HEMOGLOBIN A1C
Est. average glucose Bld gHb Est-mCnc: 126 mg/dL
Hgb A1c MFr Bld: 6 % — ABNORMAL HIGH (ref 4.8–5.6)

## 2021-12-10 ENCOUNTER — Other Ambulatory Visit: Payer: Self-pay | Admitting: Cardiovascular Disease

## 2021-12-14 DIAGNOSIS — D509 Iron deficiency anemia, unspecified: Secondary | ICD-10-CM | POA: Diagnosis not present

## 2021-12-14 DIAGNOSIS — K5900 Constipation, unspecified: Secondary | ICD-10-CM | POA: Diagnosis not present

## 2022-01-01 DIAGNOSIS — D509 Iron deficiency anemia, unspecified: Secondary | ICD-10-CM | POA: Diagnosis not present

## 2022-01-29 ENCOUNTER — Encounter: Payer: Self-pay | Admitting: Cardiovascular Disease

## 2022-01-29 ENCOUNTER — Ambulatory Visit: Payer: Medicare Other | Admitting: Cardiovascular Disease

## 2022-01-29 VITALS — BP 139/64 | HR 57 | Ht 73.0 in | Wt 230.8 lb

## 2022-01-29 DIAGNOSIS — I739 Peripheral vascular disease, unspecified: Secondary | ICD-10-CM

## 2022-01-29 DIAGNOSIS — I10 Essential (primary) hypertension: Secondary | ICD-10-CM | POA: Diagnosis not present

## 2022-01-29 DIAGNOSIS — Z951 Presence of aortocoronary bypass graft: Secondary | ICD-10-CM

## 2022-01-29 DIAGNOSIS — E785 Hyperlipidemia, unspecified: Secondary | ICD-10-CM

## 2022-01-29 DIAGNOSIS — I350 Nonrheumatic aortic (valve) stenosis: Secondary | ICD-10-CM | POA: Diagnosis not present

## 2022-01-29 DIAGNOSIS — Z8679 Personal history of other diseases of the circulatory system: Secondary | ICD-10-CM

## 2022-01-29 DIAGNOSIS — I2510 Atherosclerotic heart disease of native coronary artery without angina pectoris: Secondary | ICD-10-CM | POA: Diagnosis not present

## 2022-01-29 DIAGNOSIS — I4811 Longstanding persistent atrial fibrillation: Secondary | ICD-10-CM | POA: Diagnosis not present

## 2022-01-29 DIAGNOSIS — Z9889 Other specified postprocedural states: Secondary | ICD-10-CM

## 2022-01-29 DIAGNOSIS — Z7901 Long term (current) use of anticoagulants: Secondary | ICD-10-CM

## 2022-01-29 MED ORDER — AMLODIPINE BESYLATE 5 MG PO TABS: ORAL_TABLET | ORAL | 3 refills | Status: DC

## 2022-01-29 NOTE — Patient Instructions (Addendum)
Medication Instructions:  ? ?No changes  ? ?*If you need a refill on your cardiac medications before your next appointment, please call your pharmacy* ? ? ?Lab Work:fasting in 2 months  ?CMP ?lipid ? ?If you have labs (blood work) drawn today and your tests are completely normal, you will receive your results only by: ?MyChart Message (if you have MyChart) OR ?A paper copy in the mail ?If you have any lab test that is abnormal or we need to change your treatment, we will call you to review the results. ? ? ?Testing/Procedures: ? ? ?WILL BE SCHEDULE AT [address] [facility] [address] ?Your physician has requested that you have an abdominal aorta duplex. During this test, an ultrasound is used to evaluate the aorta. Allow 30 minutes for this exam. Do not eat after midnight the day before and avoid carbonated beverages  ? ?Follow-Up: ?At [facility], you and your health needs are our priority.  As part of our continuing mission to provide you with exceptional heart care, we have created designated Provider Care Teams.  These Care Teams include your primary Cardiologist (physician) and Advanced Practice Providers (APPs -  Physician Assistants and Nurse Practitioners) who all work together to provide you with the care you need, when you need it. ? ?  ? ?Your next appointment:   ?3 TO 4 month(s) ? ?The format for your next appointment:   ?In Person ? ?Provider:   ?Nicki Guadalajara, MD  ? ? ?

## 2022-01-29 NOTE — Progress Notes (Signed)
ID: Derek Wells, male   DOB: 1933-02-11, 88 y.o.   MRN: [id number] ? ? ? ? ? ? ?HPI: Derek Wells is a 88 y.o. male who presents to the office today for a 5 month followup cardiology evaluation.  ? ?Derek Wells has a history of hypertension, hyperlipidemia, and is s/p abdominal aortic aneurysm repair in April 2010. I had seen him on 01/14/2013 at which time he had experienced symptoms worrisome for unstable angina. Previously, in September 2013 he had undergone a nuclear perfusion study  which was essentially normal although did show mild diaphragmatic attenuation. He also been found to have a atherogenic dyslipidemia pattern with reference to his lipid panel. Catheterization the following day on 01/15/2013 revealed life-threatening coronary anatomy with 60-70% ostial left main stenosis, 90-95% distal left main stenosis, 95-99% ostial LAD stenosis, and mild/moderate stenoses in the RCA. That same day he was successfully operated by Dr. Rexanne Mano and underwent CABG x3 with a LIMA to the LAD, SVG to the OM, and SVG to the PDA. He had a unremarkable postoperative course and was discharged 4 days later.  He saw [name] on 01/27/13 and was doing well. I saw him in followup on 03/02/2013 and he  participating  in cardiac rehabilitation. When I saw him in September 2014, he was in sinus rhythm and has and had resumed a good level of activity.  ? ?In January 2015, he was found to be in atrial flutter at cardiac rehabilitation. He was unaware of his heart rhythm being abnormal. He had developed a URI over the holidays prior to that and had just completed a steroid dose pack. Xarelto 20 mg was added to his medical regimen. He did undergo subsequent blood work on January 20 which showed normal renal function and normal electrolytes. Liver function studies were normal. Hemoglobin was 13.7 hematocrit 41.7. Platelets were normal. TSH was normal at 2.63. Magnesium was normal at 2.0.  He underwent successful cardioversion for his  atrial flutter on 11/12/2013 and he cardioverted to sinus rhythm at 100 J. ? ?Since his cardioversion, he is unaware of any recurrent rhythm disturbance.   He is in the maintenance phase of cardiac rehabilitation.  When I  saw him in June 2015, he told me that he had noticed several episodes of a vague chest sensation particularly when he rolls his garbage can back up a hill at home.   ? ?On 03/18/2014, he underwent a nuclear perfusion study, which showed reduced exercise capacity with a workload of only 5.9.  He did experience mild shortness of breath.  There was no significant ST depression, but he had developed transient left bundle branch block.  The scan was interpreted at low as low risk.  Ejection fraction was 65% without wall motion abnormalities. ? ?An echo Doppler study showed an EF of 60-65%.  There were no regional wall motion abnormalities.  He had mild biatrial enlargement. ? ?[name] currently is playing golf at least 2 times per week and occasionally 3.  He is in the maintenance phase of cardiac rehabilitation.  He does admit to some weight gain.  He denies any episodes of chest pain, particularly during the cardiac rehabilitation program.  He has noticed some mild shortness of breath with deep breathing and some vague pressure in his chest which is short-lived walking up hills when he walks fast with his wife.  He has noticed some mild shortness of breath walking [place] steep [place] in the golf [place] without chest  pressure. ? 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 2016. ? ?When I saw him in 2017 he was anemic and had significantly reduced iron saturation at 6%, and his serum ferritin was 7.  I referred him for GI evaluation and underwent both colonoscopy and endoscopy by Dr. Carman ChingJames Edwards and 4 polyps were removed from his colon.  He was not having any active bleeding.  He does note improved energy.  He had taken Niferex initially and transitioned to over-the-counter iron. He has  more energy.  He is continuing with cardiac rehabilitation.  He underwent a follow-up abdominal ultrasound on 06/09/2015 which showed a patent aortobifemoral bypass graft without focal dilatation or stenosis.  ? ?In the late summer of 2017 he was at the beach for several weeks and played golf heat without chest pain.  However with fast walking he noted chest pressure.  For the past month he has had a cough.  He has a ventral hernia.  When I saw him, his ECG showed progressive PR prolongation.  I reduced his Lopressor back to 25 mg daily and added amlodipine 5 mg blood pressure and his chest tightness.  I scheduled him for a nuclear perfusion study which was done on 07/23/2016.  Ejection fraction was 62%.  There were no ECG changes ischemia, but there was a rare PVC.  He had normal perfusion without scar or ischemia.  His follow-up laboratory showed significant improvement with his iron saturation, now at 27%.  Lipid studies revealed a total cholesterol 164, triglycerides 122, HDL 40, and LDL 100.   ? ?When I saw him in January 2018, I suggested the addition of Zetia to his atorvastatin.  In the past.  He did not tolerate 80 mg, atorvastatin, and also had myalgias on 40 mg.  I also discussed plaque regression studies with Repatha. ? ?He developed recurrent atrial fibrillation and had multiple evaluations in the A. fib clinic commencing in July 2018.  Ultimately, on 05/13/2017, he underwent successful cardioversion with restoration of sinus rhythm.  He saw Rudi CocoDonna Carroll in follow-up of his cardioversion and was maintaining sinus rhythm.  There was discussion concerning his EtOH use and reduction in consumption.  He typically has at least 1 or more vodka every day.   ? ?When I lsaw him in February 2019 he was continuing to feel well and denied any recurrent episodes of atrial fibrillation.  His last echo Doppler study in October 2018 showed normal LV systolic and diastolic function.  He had biatrial enlargement with  mild aortic valve stenosis, and mean gradient of 14 and peak gradient of 25.  On follow-up abdominal aortic Doppler study the largest aortic measurement was 2.7 cm.   He was playing golf several days per week and was continue to participate in cardiac rehab. ? ?On the evening of May 12, 2018 he was going up steps to bed developed an episode of full sensation in his throat chest with some radiate this was associated with palpitations.  Symptoms lasted approximately 10 minutes.  He was seen in the following morning relation clinic and was found to be in atrial flutter with a controlled ventricular rate.  He was scheduled to undergo DC cardioversion but upon arrival for his cardioversion procedure his ECG showed that he had converted back to sinus rhythm and had PACs.  I last saw on May 29, 2018.  He admitted to occasional lightheadedness and sensation of head fog.  He denied any exertional chest  tightness.  He was continuing to drink occasional vodka but admits that his drinking is less.  He was sleeping well.  He denied any significant snoring or daytime sleepiness.  Gwyndolyn Kaufman revealed normal thyroid function studies.  History was normal although glucose was minimally increased at 103.  CBC was normal.  LDL cholesterol was 71 but triglycerides are elevated at 173 total cholesterol 143 and HDL 37. ? ?Because of increased cardiac murmur on auscultation he underwent a follow-up echo Doppler study in June 25, 2018.  This continued to show excellent ejection fraction at 60 to 65% with grade 2 diastolic dysfunction.  There was mild aortic valve stenosis with a mean gradient of 14, peak instantaneous gradient of 29, and calculated valve area of 1.6 8 cm?Marland Kitchen  When I saw him last on July 07, 2018 he was maintaining sinus rhythm and was without chest pain PND orthopnea although he still noted mild shortness of breath when bending over.  He was playing golf several days per week.   ? ?Since I last saw him in October  2019  he apparently has had recurrent issues with atrial fibrillation.  He has been seen by [name], PA, Azalee Course, [place],  Rudi Coco, NP and Edd Fabian, NP.  Apparently he has been in longstanding p

## 2022-02-08 ENCOUNTER — Other Ambulatory Visit (HOSPITAL_COMMUNITY): Payer: Self-pay | Admitting: Cardiovascular Disease

## 2022-02-08 NOTE — Telephone Encounter (Signed)
Prescription refill request for Eliquis received. Indication:Afib Last office visit:5/23 Scr:0.9 Age: 88 Weight:104.7 kg  Prescription refilled

## 2022-03-01 ENCOUNTER — Other Ambulatory Visit (HOSPITAL_COMMUNITY): Payer: Medicare Other

## 2022-03-06 ENCOUNTER — Ambulatory Visit (HOSPITAL_COMMUNITY)
Admission: RE | Admit: 2022-03-06 | Discharge: 2022-03-06 | Disposition: A | Discharge status: Home / Self Care | Payer: Medicare Other | Source: Ambulatory Visit | Attending: Cardiovascular Disease | Admitting: Cardiovascular Disease

## 2022-03-06 DIAGNOSIS — E785 Hyperlipidemia, unspecified: Secondary | ICD-10-CM | POA: Diagnosis not present

## 2022-03-06 DIAGNOSIS — I739 Peripheral vascular disease, unspecified: Secondary | ICD-10-CM | POA: Insufficient documentation

## 2022-03-06 DIAGNOSIS — I2510 Atherosclerotic heart disease of native coronary artery without angina pectoris: Secondary | ICD-10-CM | POA: Insufficient documentation

## 2022-03-06 DIAGNOSIS — Z8679 Personal history of other diseases of the circulatory system: Secondary | ICD-10-CM | POA: Diagnosis not present

## 2022-03-06 DIAGNOSIS — Z9889 Other specified postprocedural states: Secondary | ICD-10-CM | POA: Diagnosis not present

## 2022-03-06 DIAGNOSIS — I350 Nonrheumatic aortic (valve) stenosis: Secondary | ICD-10-CM | POA: Insufficient documentation

## 2022-03-18 DIAGNOSIS — D509 Iron deficiency anemia, unspecified: Secondary | ICD-10-CM | POA: Diagnosis not present

## 2022-04-12 DIAGNOSIS — Z03818 Encounter for observation for suspected exposure to other biological agents ruled out: Secondary | ICD-10-CM | POA: Diagnosis not present

## 2022-04-12 DIAGNOSIS — J069 Acute upper respiratory infection, unspecified: Secondary | ICD-10-CM | POA: Diagnosis not present

## 2022-04-18 ENCOUNTER — Other Ambulatory Visit: Payer: Self-pay | Admitting: Cardiovascular Disease

## 2022-05-08 DIAGNOSIS — H401131 Primary open-angle glaucoma, bilateral, mild stage: Secondary | ICD-10-CM | POA: Diagnosis not present

## 2022-05-12 ENCOUNTER — Other Ambulatory Visit: Payer: Self-pay | Admitting: Cardiovascular Disease

## 2022-06-08 ENCOUNTER — Other Ambulatory Visit: Payer: Self-pay | Admitting: Cardiology

## 2022-06-19 DIAGNOSIS — I2510 Atherosclerotic heart disease of native coronary artery without angina pectoris: Secondary | ICD-10-CM | POA: Diagnosis not present

## 2022-06-19 DIAGNOSIS — Z8679 Personal history of other diseases of the circulatory system: Secondary | ICD-10-CM | POA: Diagnosis not present

## 2022-06-19 DIAGNOSIS — Z9889 Other specified postprocedural states: Secondary | ICD-10-CM | POA: Diagnosis not present

## 2022-06-19 DIAGNOSIS — I350 Nonrheumatic aortic (valve) stenosis: Secondary | ICD-10-CM | POA: Diagnosis not present

## 2022-06-20 LAB — COMPREHENSIVE METABOLIC PANEL
ALT: 17 IU/L (ref 0–44)
AST: 19 IU/L (ref 0–40)
Albumin/Globulin Ratio: 1.8 (ref 1.2–2.2)
Albumin: 4.6 g/dL (ref 3.7–4.7)
Alkaline Phosphatase: 99 IU/L (ref 44–121)
BUN/Creatinine Ratio: 18 (ref 10–24)
BUN: 16 mg/dL (ref 8–27)
Bilirubin Total: 1.2 mg/dL (ref 0.0–1.2)
CO2: 22 mmol/L (ref 20–29)
Calcium: 9.5 mg/dL (ref 8.6–10.2)
Chloride: 104 mmol/L (ref 96–106)
Creatinine, Ser: 0.89 mg/dL (ref 0.76–1.27)
Globulin, Total: 2.5 g/dL (ref 1.5–4.5)
Glucose: 97 mg/dL (ref 70–99)
Potassium: 4.6 mmol/L (ref 3.5–5.2)
Sodium: 141 mmol/L (ref 134–144)
Total Protein: 7.1 g/dL (ref 6.0–8.5)
eGFR: 83 mL/min/{1.73_m2} (ref >59)

## 2022-06-20 LAB — LIPID PANEL
Chol/HDL Ratio: 2.9 ratio (ref 0.0–5.0)
Cholesterol, Total: 113 mg/dL (ref 100–199)
HDL: 39 mg/dL — ABNORMAL LOW (ref >39)
LDL Chol Calc (NIH): 59 mg/dL (ref 0–99)
Triglycerides: 74 mg/dL (ref 0–149)
VLDL Cholesterol Cal: 15 mg/dL (ref 5–40)

## 2022-06-24 ENCOUNTER — Encounter: Payer: Self-pay | Admitting: Cardiovascular Disease

## 2022-06-24 ENCOUNTER — Ambulatory Visit: Payer: Medicare Other | Attending: Cardiovascular Disease | Admitting: Cardiovascular Disease

## 2022-06-24 VITALS — BP 152/68 | HR 52 | Ht 73.0 in | Wt 234.0 lb

## 2022-06-24 DIAGNOSIS — E1159 Type 2 diabetes mellitus with other circulatory complications: Secondary | ICD-10-CM

## 2022-06-24 DIAGNOSIS — Z9889 Other specified postprocedural states: Secondary | ICD-10-CM | POA: Diagnosis not present

## 2022-06-24 DIAGNOSIS — I10 Essential (primary) hypertension: Secondary | ICD-10-CM

## 2022-06-24 DIAGNOSIS — I4811 Longstanding persistent atrial fibrillation: Secondary | ICD-10-CM

## 2022-06-24 DIAGNOSIS — Z951 Presence of aortocoronary bypass graft: Secondary | ICD-10-CM

## 2022-06-24 DIAGNOSIS — I350 Nonrheumatic aortic (valve) stenosis: Secondary | ICD-10-CM | POA: Diagnosis not present

## 2022-06-24 DIAGNOSIS — E785 Hyperlipidemia, unspecified: Secondary | ICD-10-CM

## 2022-06-24 DIAGNOSIS — Z8679 Personal history of other diseases of the circulatory system: Secondary | ICD-10-CM

## 2022-06-24 DIAGNOSIS — I739 Peripheral vascular disease, unspecified: Secondary | ICD-10-CM

## 2022-06-24 DIAGNOSIS — Z7901 Long term (current) use of anticoagulants: Secondary | ICD-10-CM

## 2022-06-24 NOTE — Patient Instructions (Signed)
Medication Instructions:   No changes    *If you need a refill on your cardiac medications before your next appointment, please call your pharmacy*   Lab Work:  Not needed   Testing/Procedures:  Will be schedule in Feb 2024  at [facility] has requested that you have an echocardiogram. Echocardiography is a painless test that uses sound waves to create images of your heart. It provides your doctor with information about the size and shape of your heart and how well your heart's chambers and valves are working. This procedure takes approximately one hour. There are no restrictions for this procedure.    Follow-Up: At [facility], you and your health needs are our priority.  As part of our continuing mission to provide you with exceptional heart care, we have created designated Provider Care Teams.  These Care Teams include your primary Cardiologist (physician) and Advanced Practice Providers (APPs -  Physician Assistants and Nurse Practitioners) who all work together to provide you with the care you need, when you need it.     Your next appointment:   4 month(s)  The format for your next appointment:   In Person  Provider:   Shelva Majestic, MD    Other Instructions

## 2022-06-24 NOTE — Progress Notes (Signed)
ID: Derek Wells, male   DOB: 27-Mar-1933, 88 y.o.   MRN: [id number]        HPI: BRENEN BEIGEL is a 88 y.o. male who presents to the office today for a 5 month followup cardiology evaluation.   Mr. Catlin has a history of hypertension, hyperlipidemia, and is s/p abdominal aortic aneurysm repair in April 2010. I had seen him on 01/14/2013 at which time he had experienced symptoms worrisome for unstable angina. Previously, in September 2013 he had undergone a nuclear perfusion study  which was essentially normal although did show mild diaphragmatic attenuation. He also been found to have a atherogenic dyslipidemia pattern with reference to his lipid panel. Catheterization the following day on 01/15/2013 revealed life-threatening coronary anatomy with 60-70% ostial left main stenosis, 90-95% distal left main stenosis, 95-99% ostial LAD stenosis, and mild/moderate stenoses in the RCA. That same day he was successfully operated by Dr. Arvid Right and underwent CABG x3 with a LIMA to the LAD, SVG to the OM, and SVG to the PDA. He had a unremarkable postoperative course and was discharged 4 days later.  He saw [name] on 01/27/13 and was doing well. I saw him in followup on 03/02/2013 and he  participating  in cardiac rehabilitation. When I saw him in September 2014, he was in sinus rhythm and has and had resumed a good level of activity.   In January 2015, he was found to be in atrial flutter at cardiac rehabilitation. He was unaware of his heart rhythm being abnormal. He had developed a URI over the holidays prior to that and had just completed a steroid dose pack. Xarelto 20 mg was added to his medical regimen. He did undergo subsequent blood work on January 20 which showed normal renal function and normal electrolytes. Liver function studies were normal. Hemoglobin was 13.7 hematocrit 41.7. Platelets were normal. TSH was normal at 2.63. Magnesium was normal at 2.0.  He underwent successful cardioversion for his  atrial flutter on 11/12/2013 and he cardioverted to sinus rhythm at 100 J.  Since his cardioversion, he is unaware of any recurrent rhythm disturbance.   He is in the maintenance phase of cardiac rehabilitation.  When I  saw him in June 2015, he told me that he had noticed several episodes of a vague chest sensation particularly when he rolls his garbage can back up a hill at home.    On 03/18/2014, he underwent a nuclear perfusion study, which showed reduced exercise capacity with a workload of only 5.9.  He did experience mild shortness of breath.  There was no significant ST depression, but he had developed transient left bundle branch block.  The scan was interpreted at low as low risk.  Ejection fraction was 65% without wall motion abnormalities.  An echo Doppler study showed an EF of 60-65%.  There were no regional wall motion abnormalities.  He had mild biatrial enlargement.  Mr. Gallicchio currently is playing golf at least 2 times per week and occasionally 3.  He is in the maintenance phase of cardiac rehabilitation.  He does admit to some weight gain.  He denies any episodes of chest pain, particularly during the cardiac rehabilitation program.  He has noticed some mild shortness of breath with deep breathing and some vague pressure in his chest which is short-lived walking up hills when he walks fast with his wife.  He has noticed some mild shortness of breath walking up steep hills in the golf [place] without  chest pressure.  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 2016.  When I saw him in 2017 he was anemic and had significantly reduced iron saturation at 6%, and his serum ferritin was 7.  I referred him for GI evaluation and underwent both colonoscopy and endoscopy by Dr. [name] and 4 polyps were removed from his colon.  He was not having any active bleeding.  He does note improved energy.  He had taken Niferex initially and transitioned to over-the-counter iron. He has  more energy.  He is continuing with cardiac rehabilitation.  He underwent a follow-up abdominal ultrasound on 06/09/2015 which showed a patent aortobifemoral bypass graft without focal dilatation or stenosis.   In the late summer of 2017 he was at the beach for several weeks and played golf heat without chest pain.  However with fast walking he noted chest pressure.  For the past month he has had a cough.  He has a ventral hernia.  When I saw him, his ECG showed progressive PR prolongation.  I reduced his Lopressor back to 25 mg daily and added amlodipine 5 mg blood pressure and his chest tightness.  I scheduled him for a nuclear perfusion study which was done on 07/23/2016.  Ejection fraction was 62%.  There were no ECG changes ischemia, but there was a rare PVC.  He had normal perfusion without scar or ischemia.  His follow-up laboratory showed significant improvement with his iron saturation, now at 27%.  Lipid studies revealed a total cholesterol 164, triglycerides 122, HDL 40, and LDL 100.    When I saw him in January 2018, I suggested the addition of Zetia to his atorvastatin.  In the past.  He did not tolerate 80 mg, atorvastatin, and also had myalgias on 40 mg.  I also discussed plaque regression studies with Repatha.  He developed recurrent atrial fibrillation and had multiple evaluations in the A. fib clinic commencing in July 2018.  Ultimately, on 05/13/2017, he underwent successful cardioversion with restoration of sinus rhythm.  He saw [name] in follow-up of his cardioversion and was maintaining sinus rhythm.  There was discussion concerning his EtOH use and reduction in consumption.  He typically has at least 1 or more vodka every day.    When I lsaw him in February 2019 he was continuing to feel well and denied any recurrent episodes of atrial fibrillation.  His last echo Doppler study in October 2018 showed normal LV systolic and diastolic function.  He had biatrial enlargement with  mild aortic valve stenosis, and mean gradient of 14 and peak gradient of 25.  On follow-up abdominal aortic Doppler study the largest aortic measurement was 2.7 cm.   He was playing golf several days per week and was continue to participate in cardiac rehab.  On the evening of May 12, 2018 he was going up steps to bed developed an episode of full sensation in his throat chest with some radiate this was associated with palpitations.  Symptoms lasted approximately 10 minutes.  He was seen in the following morning relation clinic and was found to be in atrial flutter with a controlled ventricular rate.  He was scheduled to undergo DC cardioversion but upon arrival for his cardioversion procedure his ECG showed that he had converted back to sinus rhythm and had PACs.  I last saw on May 29, 2018.  He admitted to occasional lightheadedness and sensation of head fog.  He denied any exertional  chest tightness.  He was continuing to drink occasional vodka but admits that his drinking is less.  He was sleeping well.  He denied any significant snoring or daytime sleepiness.  Elder Love revealed normal thyroid function studies.  History was normal although glucose was minimally increased at 103.  CBC was normal.  LDL cholesterol was 71 but triglycerides are elevated at 173 total cholesterol 143 and HDL 37.  Because of increased cardiac murmur on auscultation he underwent a follow-up echo Doppler study in June 25, 2018.  This continued to show excellent ejection fraction at 60 to 65% with grade 2 diastolic dysfunction.  There was mild aortic valve stenosis with a mean gradient of 14, peak instantaneous gradient of 29, and calculated valve area of 1.6 8 cm.  When I saw him last on July 07, 2018 he was maintaining sinus rhythm and was without chest pain PND orthopnea although he still noted mild shortness of breath when bending over.  He was playing golf several days per week.    Since I last saw him in October  2019  he apparently has had recurrent issues with atrial fibrillation.  He has been seen by Kerin Ransom, PA, Almyra Deforest, [place],  [name], NP and Coletta Memos, NP.  Apparently he has been in longstanding persistent atrial fibrillation for well over a year.  He has been on chronic anticoagulation with Eliquis.  When I saw him in April 2021 after not having seen him in 18 months he denied any recurrent anginal symptomatology.  He was continuing to play golf 2 days/week and had experienced some shortness of breath with uphill walking.  He was walking his dog daily.  Since he had not had an echo in over 2 years I recommended a follow-up echo Doppler study which was done on 02/14/2020.  This revealed an EF of 60 to 65%.  He had mildly increased pulmonary artery systolic pressure 41.7 mm.  There was evidence for mild aortic valve stenosis with a mean gradient of 13.8 and peak gradient of 22.8.  Pulmonary artery systolic pressure was mildly increased at 36.5.  He had normal atrial dimensions.    I saw him in October 2021.  Unfortunately, recently his wife broke her hip and required hip surgery and rehab therapy.  He has persistent atrial fibrillation and has been on Eliquis 5 mg twice a day.  At that time, his ECG showed atrial fibrillation at 54 bpm.  He continued to be on amlodipine 2.5 mg, irbesartan 150 mg and furosemide 20 mg daily.  He is on atorvastatin 40 mg for hyperlipidemia.  He is on Metformin 500 mg daily.    He underwent an echo Doppler study on January 08, 2021 which showed an EF of 55 to 60% without wall motion abnormalities.  There was mild biatrial enlargement.  There was mild aortic stenosis with a mean gradient of 14 mm and estimated AVA at 1.65 cm.  Abdominal aorta Doppler study on January 05, 2021 showed moderate atherosclerosis throughout the iliac arteries.  The exam was limited due to overlying bowel.  There was no evidence for obvious recurrent aneurysm.  Since I last saw him, he had called  our office in May 2022 with reports of bradycardia with rates in the 40s as well as fatigability.  He was evaluated by Sande Rives on Feb 01, 2021.  He reported heart rates in the 40s with significant fatigability.  He subsequently wore a Zio patch monitor from May 9 through  May 23.  He was in atrial fibrillation throughout the monitor with rates ranging from the lowest at 32 on May 16 at 2:21 AM and the highest at 24 at 12:45 PM on May 10 with an average rate of 50 bpm.  He had bundle branch block/IVCD D.  There are occasional isolated PVCs without couplets or triplets.  I saw him on March 19, 2021 he continued to have periods of fatigability.    Typically his heart rate is in the low to mid 40s.  However on June 18 at 10:43 AM he reported his heart rate was around 38 bpm.  He also has noted some left calf claudication symptomatology.  He admits to decreased energy.  He still playing golf 2 days/week.  He denies recurrent chest pain.  During that evaluation, his blood pressure was stable on amlodipine 5 mg, irbesartan 150 mg and he was taking furosemide 20 mg daily.  There were no signs of volume overload and I suggested he try decreasing furosemide to every other day or as needed.  With his progressive left calf claudication symptomatology I recommended a lower extremity arterial Doppler study.  He underwent his Doppler study which raise concern for progressive PVD and he was referred to Dr. Gwenlyn Found.  He subsequently was evaluated by Dr. Monica Martinez of [facility] since Dr. Kellie Simmering had previously done his open AAA repair with aorto by iliac bypass.  Initially, a conservative approach was recommended with plans for follow-up in approximately 6 months.  If intervention is to be performed a brachial approach in the left arm to treat his left SFA high-grade stenosis would be necessary.  I saw Mr. Lecomte on September 27, 2021 at which time he felt well and denied any chest pain.   He continues to be in longstanding  persistent atrial fibrillation.  He denies presyncope or syncope.  His wife had recently tested positive for COVID before Christmas and he tested positive as well and received treatment with Paxlovid and tested -2 days prior to this evaluation.  He was having claudication issues and remotely had undergone open AAA repair with aorto by iliac bypass surgery by Dr. Kellie Simmering and had developed left calf claudication with Doppler imaging on April 11, 2021 demonstrating high-grade proximal right SFA stenosis with velocity of 411 consistent with 75 to 99% stenosis and on the left and mid SFA high-grade stenosis with velocity of 406 also consistent with 75 to 99% stenosis.  He was evaluated by Dr. Carlis Abbott.  During his evaluation I discussed further aggressive lipid-lowering therapy and recommended a retrial of adding Zetia 10 mg to his atorvastatin 40 mg.  Remotely he could not tolerate atorvastatin 80 mg.  He underwent a follow-up echo Doppler study in February 2023 which showed LV function with EF 60 to 65%, mild LVH.  There was severe left atrial dilatation with moderate right atrial dilatation.  There was moderate aortic valve stenosis with mild MR care.  Aortic peak gradient was 35 mm with mean gradient of 20 and estimated valve area 1.7 cm.  There was mild dilation of his ascending aorta.  I last saw Mr. Jiminez on Jan 29, 2022 at which time he denied any recurrent chest pain.  He had noted some mild occasional shortness of breath episodes.   He continues to play golf approximately 2 days/week.  He uses a golf cart with a flag to allow him to take the cart to his ball where it is located.  He is planning to  see Dr. Carlis Abbott for follow-up vascular surgery evaluation in light of his left leg greater than right claudication symptoms.  He admits to some shortness of breath particularly when he leans over.  He was supposed to have had an abdominal aortic aneurysm follow-up Doppler study which had not yet been done.  He has not had  recent laboratory.  He is and he continues to be in longstanding persistent atrial fibrillation and is on Eliquis anticoagulation.  Currently for the past week he ran out of his amlodipine which had been 5 mg daily and he continues to take furosemide 20 mg in addition to irbesartan 150 mg daily for hypertension.  He is on Zetia 10 mg in addition to atorvastatin 40 mg for hyperlipidemia.  During that evaluation, I recommended he resume amlodipine with target blood pressure less than 130/80.  He was to undergo follow-up abdominal aortic aneurysm duplex imaging.  Presently, he feels well.  In July and August, 2023 he had a longstanding bronchial infection and required antibiotic therapy for approximately 5 weeks.  He denies any chest pain.  At times he notes some balance issues.  He experiences occasional left lower extremity claudication symptomatology.  He will be following up with Dr. Carlis Abbott.  He sees Dr. Inda Merlin for primary care.  He is no longer taking furosemide.  He presents for reevaluation.     Past Medical History:  Diagnosis Date   CAD (coronary artery disease), severe needing CABG 01/19/2013   Chest pain    2D ECHO, 06/16/2012 - EF 50-55%, borderline low left ventricular systolic function   Coronary artery disease    Dyspnea on exertion    LEXISCAN, 06/17/2012 - fixed inferior bowel artifact and basal septal defect, probably related to IVCD/incomplete LBBB, no reversible ischemia, post-stress EF 66%   History of AAA (abdominal aortic aneurysm) repair    ABDOMINAL AORTIC DOPPLER, 06/16/2013 - normal   HTN (hypertension) 01/19/2013   Hyperlipidemia LDL goal < 70 01/19/2013   Hypertension    S/P CABG x 3 01/19/2013    Past Surgical History:  Procedure Laterality Date   CARDIAC CATHETERIZATION  01/15/2013   Severe coronary obstructive disease with 60-70% ostial left main and 90-85% distal LAD stenosis and mild-moderate stenoses in RCA, recommended for CABG   CARDIOVERSION N/A 11/12/2013    Procedure: CARDIOVERSION;  Surgeon: Troy Sine, MD;  Location: [facility] ENDOSCOPY;  Service: Cardiovascular;  Laterality: N/A;  11:16 Lido 86m, Propofol 1284mIV 11:19 synch 100 joules conversion conversion to NSR....   CARDIOVERSION N/A 05/13/2017   Procedure: CARDIOVERSION;  Surgeon: NaAcie FredricksonhWonda ChengMD;  [facility];  Service: Cardiovascular;  Laterality: N/A;   CORONARY ARTERY BYPASS GRAFT N/A 01/15/2013   Procedure: Coronary Artery Bypass Graft times three using Right Greater Saphenous Vein Graft harvested endoscopically and Left Internal Mammary Artery and;  Surgeon: BrGaye PollackMD;  [facility];  Service: Open Heart Surgery;  Laterality: N/A;   INTRAOPERATIVE TRANSESOPHAGEAL ECHOCARDIOGRAM  01/15/2013   Procedure: INTRAOPERATIVE TRANSESOPHAGEAL ECHOCARDIOGRAM;  Surgeon: BrGaye PollackMD;  [facility];  Service: Open Heart Surgery;;   LEFT HEART CATHETERIZATION WITH CORONARY ANGIOGRAM N/A 01/15/2013   Procedure: LEFT HEART CATHETERIZATION WITH CORONARY ANGIOGRAM;  Surgeon: ThTroy SineMD;  [facility];  Service: Cardiovascular;  Laterality: N/A;   VASCULAR SURGERY      Allergies  Allergen Reactions   Penicillin G     Other reaction(s): rash   Penicillins Rash    Has patient  had a PCN reaction causing immediate rash, facial/tongue/throat swelling, SOB or lightheadedness with hypotension: no Has patient had a PCN reaction causing severe rash involving mucus membranes or skin necrosis: no Has patient had a PCN reaction that required hospitalization: no Has patient had a PCN reaction occurring within the last 10 years: yes If all of the above answers are "NO", then may proceed with Cephalosporin use.     Current Outpatient Medications  Medication Sig Dispense Refill   amLODipine (NORVASC) 5 MG tablet TAKE 1 TABLET(5 MG) BY MOUTH DAILY 90 tablet 3   Ascorbic Acid (VITAMIN C) 1000 MG tablet Take 1,000 mg by mouth daily. 1 Tablet Daily     atorvastatin  (LIPITOR) 40 MG tablet TAKE 1 TABLET BY MOUTH EVERY DAY 90 tablet 2   cholecalciferol (VITAMIN D3) 25 MCG (1000 UNIT) tablet Take 1,000 Units by mouth daily. 1 Tablet Daily     COVID-19 mRNA bivalent vaccine, Pfizer, (PFIZER COVID-19 VAC BIVALENT) injection Inject into the muscle. 0.3 mL 0   ELIQUIS 5 MG TABS tablet TAKE 1 TABLET BY MOUTH TWICE DAILY 180 tablet 1   ezetimibe (ZETIA) 10 MG tablet TAKE 1 TABLET(10 MG) BY MOUTH DAILY 30 tablet 6   influenza vaccine adjuvanted (FLUAD QUADRIVALENT) 0.5 ML injection Inject into the muscle. 0.5 mL 0   irbesartan (AVAPRO) 150 MG tablet TAKE 1 TABLET BY MOUTH DAILY 90 tablet 3   latanoprost (XALATAN) 0.005 % ophthalmic solution 1 drop at bedtime.     metFORMIN (GLUCOPHAGE) 500 MG tablet Take 500 mg by mouth daily.     Multiple Vitamin (MULTIVITAMIN WITH MINERALS) TABS Take 1 tablet by mouth daily.     No current facility-administered medications for this visit.    Socially he is re-married. His first wife of 42 years passed away. He previously had smoked cigars prior to his bypass surgery but has not had any tobacco use since.  He continues to play golf at least 2 times per week.  He does a lot of leg exercise with cardiac rehabilitation.  ROS General: Negative; No fevers, chills, or night sweats; fatigability HEENT: Negative; No changes in vision or hearing, sinus congestion, difficulty swallowing Pulmonary: URI symptoms during the winter; recent prolonged antibiotics for bronchial infection Cardiovascular: Mild shortness of breath with uphill walking; see history of present illness;  left calf claudication with high-grade proximal right SFA stenosis and left mid SFA stenoses. GI: Negative; No nausea, vomiting, diarrhea, or abdominal pain GU: Negative; No dysuria, hematuria, or difficulty voiding Musculoskeletal: Negative; no myalgias, joint pain, or weakness Hematologic/Oncology: Negative; no easy bruising, bleeding Endocrine: Negative; no  heat/cold intolerance; no diabetes Neuro: Negative; no changes in balance, headaches Skin: Removal of small left forearm melanoma. Psychiatric: Negative; No behavioral problems, depression Sleep: Negative; No snoring, daytime sleepiness, hypersomnolence, bruxism, restless legs, hypnogognic hallucinations, no cataplexy Other comprehensive 14 point system review is negative.   PE BP (!) 152/68   Pulse (!) 52   Ht '6\' 1"'  (1.854 m)   Wt 234 lb (106.1 kg)   SpO2 97%   BMI 30.87 kg/m    Repeat blood pressure by me was 136/68  Wt Readings from Last 3 Encounters:  06/24/22 234 lb (106.1 kg)  01/29/22 230 lb 12.8 oz (104.7 kg)  09/27/21 232 lb 12.8 oz (105.6 kg)   General: Alert, oriented, no distress.  Appears younger than stated age Skin: normal turgor, no rashes, warm and dry HEENT: Normocephalic, atraumatic. Pupils equal round and reactive to light;  sclera anicteric; extraocular muscles intact;  Nose without nasal septal hypertrophy Mouth/Parynx benign; Mallinpatti scale 3 Neck: No JVD, no carotid bruits; normal carotid upstroke Lungs: clear to ausculatation and percussion; no wheezing or rales Chest wall: without tenderness to palpitation Heart: PMI not displaced, RRR, s1 s2 normal, 1/6 systolic murmur, no diastolic murmur, no rubs, gallops, thrills, or heaves Abdomen: soft, nontender; no hepatosplenomehaly, BS+; abdominal aorta nontender and not dilated by palpation. Back: no CVA tenderness Pulses 2+;  decreased 1+ left dorsalis pedis Musculoskeletal: full range of motion, normal strength, no joint deformities Extremities: no clubbing cyanosis or edema, Homan's sign negative  Neurologic: grossly nonfocal; Cranial nerves grossly wnl Psychologic: Normal mood and affect    June 24, 2022 ECG (independently read by me): Atrial fibrillation at 52, Left axis deviation  Jan 29, 2022 ECG (independently read by me): Atrial fibrillation at 57; Right axis deviation, Nonspecific IVCD    September 27, 2021 ECG (independently read by me):  Atrial fibrillation at 53, LBBB  March 19, 2021 ECG (independently read by me): Atrial fibrillation at 47; LAD, QTc 430 msec  October 2021 ECG (independently read by me): Atrial fibrillation at 54; PRWP; QTc 428 bmsec  April 2021 ECG (independently read by me): Atrial fibrillation with an average ventricular rate of 53 bpm with a range of in the 60s to 40s.  One isolated PVC.  Nonspecific interventricular block.  July 07, 2020 ECG (independently read by me): Sinus bradycardia with first-degree AV block.  PR interval 392 ms.  Incomplete left bundle branch block.  May 29, 2018 ECG (independently read by me): Sinus rhythm with first-degree AV block at 72 bpm with a PR interval of 312 ms.  Occasional PACs.  February 2019 ECG (independently read by me): Sinus rhythm at 63 bpm.  First-degree AV block with a PR interval at 344 ms.  PAC.  Nonspecific interventricular conduction delay.  October 2018 ECG (independently read by me): Sinus rhythm with first-degree AV block with a PR interval of 314 ms.  Nonspecific interventricular block.  QTc interval 431  January 2018 ECG (independently read by me): Normal sinus rhythm with first-degree AV block.  PR interval 300 ms.  Nonspecific interventricular conduction delay.  October 2017 ECG (independently read by me): Normal sinus rhythm at 66 bpm.  First-degree block with a PR interval at 290 ms.  Nonspecific interventricular block.  03/02/2015 ECG (independently read by me): Sinus rhythm at 75 bpm.  Occasional unifocal PVCs.  Nonspecific interventricular block.  Prior October 2015 ECG (independently read by me): Normal sinus rhythm at 67 beats per minute.  First degree A-V block with PR interval at 290 ms.  QTc interval 439 ms.  No ST segment changes.  Prior June 2015 ECG (independently read by me): Normal sinus rhythm at 68 beats per minute with first degree AV block with a PR interval at 284 ms.  QTc  interval 435 ms.  No significant ST-T changes.  T-wave inversion in aVL  ECG today (independently read by me):  Normal sinus rhythm at 71 beats per minute. First create a block with PR interval at 240 ms. QTc interval normal at 4-5 ms.  Prior 10/27/13 ECG (independently read by me): Atrial flutter with variable rate but predominantly 4-1 with occasional 3-1 block; average heart rate 70 beats per minute  Prior ECG from 06/04/2013: Sinus rhythm with first-degree AV block. Nonspecific interventricular block  LABS:    Latest Ref Rng & Units 06/19/2022   10:17 AM 11/08/2021  9:53 AM 02/01/2021    3:47 PM  BMP  Glucose 70 - 99 mg/dL 97  93  95   BUN 8 - 27 mg/dL '16  14  20   ' Creatinine 0.76 - 1.27 mg/dL 0.89  0.93  1.05   BUN/Creat Ratio 10 - '24 18  15  19   ' Sodium 134 - 144 mmol/L 141  140  140   Potassium 3.5 - 5.2 mmol/L 4.6  4.9  4.7   Chloride 96 - 106 mmol/L 104  103  103   CO2 20 - 29 mmol/L '22  23  24   ' Calcium 8.6 - 10.2 mg/dL 9.5  9.4  9.9       Latest Ref Rng & Units 06/19/2022   10:17 AM 11/08/2021    9:53 AM 02/01/2021    3:47 PM  Hepatic Function  Total Protein 6.0 - 8.5 g/dL 7.1  6.8  7.1   Albumin 3.7 - 4.7 g/dL 4.6  4.4  4.4   AST 0 - 40 IU/L '19  18  15   ' ALT 0 - 44 IU/L '17  18  18   ' Alk Phosphatase 44 - 121 IU/L 99  77  83   Total Bilirubin 0.0 - 1.2 mg/dL 1.2  1.1  0.9       Latest Ref Rng & Units 11/08/2021    9:53 AM 03/19/2021   12:00 PM 01/10/2020   10:35 AM  CBC  WBC 3.4 - 10.8 x10E3/uL 9.1  9.0  7.4   Hemoglobin 13.0 - 17.7 g/dL 12.7  13.1  13.8   Hematocrit 37.5 - 51.0 % 39.9  40.7  41.6   Platelets 150 - 450 x10E3/uL 248  259  228    Lab Results  Component Value Date   MCV 81 11/08/2021   MCV 83 03/19/2021   MCV 87 01/10/2020   Lab Results  Component Value Date   TSH 1.970 11/08/2021   Lipid Panel     Component Value Date/Time   CHOL 113 06/19/2022 1017   CHOL 111 03/01/2015 1033   TRIG 74 06/19/2022 1017   TRIG 90 03/01/2015 1033   HDL 39  (L) 06/19/2022 1017   HDL 41 03/01/2015 1033   CHOLHDL 2.9 06/19/2022 1017   CHOLHDL 4.1 07/19/2016 0949   VLDL 24 07/19/2016 0949   LDLCALC 59 06/19/2022 1017   LDLCALC 52 03/01/2015 1033     RADIOLOGY: No results found.  IMPRESSION:  1. Nonrheumatic aortic valve stenosis   2. Hx of CABG   3. S/P AAA repair   4. Longstanding persistent atrial fibrillation ([place])   5. Anticoagulation adequate   6. Claudication ([place])   7. Primary hypertension   8. Hyperlipidemia with target LDL less than 70   9. Type 2 diabetes mellitus with other circulatory complication, without long-term current use of insulin [facility])     ASSESSMENT AND PLAN: Mr. Krempasky is an active 88 year old white male who underwent emergent CABG revascularization surgery after cardiac catheterization on 01/15/2013 demonstrated severe life threatening coronary anatomy.  He  developed atrial flutter in January 2015 and was started on anticoagulation with Xarelto and underwent cardioversion.  He developed recurrent episodes of atrial fibrillation in 2018 and had maintained sinus rhythm since his repeat cardioversion on 05/13/2017 but developed recurrent atrial flutter 1 year later in May 13, 2018 was found to have a flutter with controlled rate.  When seen in October 2019 he was maintaining sinus rhythm.  However, subsequently  he has developed longstanding persistent atrial fibrillation and has been maintained on Eliquis for anticoagulation.  An echo Doppler study in 2019 showed EF 60 to 65% with mean aortic gradient 14 consistent with mild aortic stenosis and grade 2 diastolic dysfunction.  In May 2020 1 aortic stenosis remains mild with mean gradient of 13.8 and peak gradient of 22.8.  There was mild pulmonary hypertension with PA systolic pressure of 81.8.  His echo in April 2022 showed a mean gradient of 14.  Most recent echo was reviewed from February 2022 which now shows a mean gradient of 20 and peak aortic gradient of 35 mm.   There was severe left atrial dilatation and moderate right atrial dilatation.  His most recent echo in February 2023 showed EF 60 to 65% with moderate aortic stenosis, mean gradient 20 peak gradient 35 mmHg. his most recent abdominal aortic ultrasound was stable with abdominal aorta at 2.6 cm with no thrombus in the IVC.  He continues to have issues with left leg claudication and will be following up with Dr. Carlis Abbott.  He is in permanent atrial fibrillation with controlled ventricular rate at 52 and is on Eliquis 5 mg twice a day for anticoagulation.  He is not on any rate control medication.  Blood pressure today is stable on amlodipine 5 mg and irbesartan 150 mg.  He is no longer on furosemide.  He is diabetic on metformin.  He continues to be on Zetia and atorvastatin for aggressive lipid-lowering therapy.  LDL cholesterol in January 2023 was 50.  I will see him in 4 to 6 months for follow-up evaluation.     Troy Sine, MD, [facility]  06/27/2022 1:11 PM

## 2022-06-27 ENCOUNTER — Encounter: Payer: Self-pay | Admitting: Cardiovascular Disease

## 2022-07-29 ENCOUNTER — Other Ambulatory Visit (HOSPITAL_BASED_OUTPATIENT_CLINIC_OR_DEPARTMENT_OTHER): Payer: Self-pay

## 2022-07-29 MED ORDER — COMIRNATY 30 MCG/0.3ML IM SUSY
PREFILLED_SYRINGE | INTRAMUSCULAR | 0 refills | Status: DC
  Filled 2022-07-29: qty 0.3, 1d supply, fill #0

## 2022-08-07 ENCOUNTER — Other Ambulatory Visit (HOSPITAL_COMMUNITY): Payer: Self-pay | Admitting: Cardiovascular Disease

## 2022-08-07 DIAGNOSIS — I4819 Other persistent atrial fibrillation: Secondary | ICD-10-CM

## 2022-08-07 NOTE — Telephone Encounter (Signed)
Prescription refill request for Eliquis received. Indication: Afib  Last office visit:06/24/22 Tresa Endo) Scr: 0.89 (06/19/22)  Age: 88 Weight: 106.1kg  Appropriate dose and refill sent to requested pharmacy.

## 2022-09-02 DIAGNOSIS — J40 Bronchitis, not specified as acute or chronic: Secondary | ICD-10-CM | POA: Diagnosis not present

## 2022-09-02 DIAGNOSIS — E119 Type 2 diabetes mellitus without complications: Secondary | ICD-10-CM | POA: Diagnosis not present

## 2022-10-31 ENCOUNTER — Encounter (HOSPITAL_COMMUNITY): Payer: Self-pay | Admitting: *Deleted

## 2022-11-02 ENCOUNTER — Other Ambulatory Visit: Payer: Self-pay | Admitting: Cardiovascular Disease

## 2022-11-04 ENCOUNTER — Ambulatory Visit: Payer: Medicare Other | Attending: Cardiovascular Disease

## 2022-11-04 DIAGNOSIS — R0602 Shortness of breath: Secondary | ICD-10-CM | POA: Insufficient documentation

## 2022-11-04 DIAGNOSIS — I350 Nonrheumatic aortic (valve) stenosis: Secondary | ICD-10-CM | POA: Insufficient documentation

## 2022-11-04 DIAGNOSIS — J40 Bronchitis, not specified as acute or chronic: Secondary | ICD-10-CM | POA: Insufficient documentation

## 2022-11-04 DIAGNOSIS — Z87891 Personal history of nicotine dependence: Secondary | ICD-10-CM | POA: Insufficient documentation

## 2022-11-04 LAB — ECHOCARDIOGRAM COMPLETE
AR max vel: 1.9 cm2
AV Area VTI: 1.63 cm2
AV Area mean vel: 2.06 cm2
AV Mean grad: 19 mmHg
AV Peak grad: 31 mmHg
Ao pk vel: 2.79 m/s
Area-P 1/2: 4.22 cm2
S' Lateral: 2.45 cm

## 2022-11-05 ENCOUNTER — Ambulatory Visit: Payer: Medicare Other | Admitting: Cardiovascular Disease

## 2022-11-05 ENCOUNTER — Encounter: Payer: Self-pay | Admitting: Cardiovascular Disease

## 2022-11-05 DIAGNOSIS — R0602 Shortness of breath: Secondary | ICD-10-CM

## 2022-11-05 DIAGNOSIS — I10 Essential (primary) hypertension: Secondary | ICD-10-CM

## 2022-11-05 DIAGNOSIS — I739 Peripheral vascular disease, unspecified: Secondary | ICD-10-CM

## 2022-11-05 DIAGNOSIS — J40 Bronchitis, not specified as acute or chronic: Secondary | ICD-10-CM

## 2022-11-05 DIAGNOSIS — I4811 Longstanding persistent atrial fibrillation: Secondary | ICD-10-CM

## 2022-11-05 DIAGNOSIS — Z87891 Personal history of nicotine dependence: Secondary | ICD-10-CM

## 2022-11-05 DIAGNOSIS — I2510 Atherosclerotic heart disease of native coronary artery without angina pectoris: Secondary | ICD-10-CM | POA: Diagnosis not present

## 2022-11-05 DIAGNOSIS — E1159 Type 2 diabetes mellitus with other circulatory complications: Secondary | ICD-10-CM

## 2022-11-05 DIAGNOSIS — Z7901 Long term (current) use of anticoagulants: Secondary | ICD-10-CM

## 2022-11-05 DIAGNOSIS — I350 Nonrheumatic aortic (valve) stenosis: Secondary | ICD-10-CM

## 2022-11-05 DIAGNOSIS — Z951 Presence of aortocoronary bypass graft: Secondary | ICD-10-CM

## 2022-11-05 DIAGNOSIS — Z9889 Other specified postprocedural states: Secondary | ICD-10-CM

## 2022-11-05 DIAGNOSIS — Z8679 Personal history of other diseases of the circulatory system: Secondary | ICD-10-CM

## 2022-11-05 NOTE — Progress Notes (Signed)
ID: Derek Wells, male   DOB: April 18, 1933, 89 y.o.   MRN: [id number]        HPI: ISHAQ BATIS is a 89 y.o. male who presents to the office today for a 4 month followup cardiology evaluation.   Mr. Albares has a history of hypertension, hyperlipidemia, and is s/p abdominal aortic aneurysm repair in April 2010. I had seen him on 01/14/2013 at which time he had experienced symptoms worrisome for unstable angina. Previously, in September 2013 he had undergone a nuclear perfusion study  which was essentially normal although did show mild diaphragmatic attenuation. He also been found to have a atherogenic dyslipidemia pattern with reference to his lipid panel. Catheterization the following day on 01/15/2013 revealed life-threatening coronary anatomy with 60-70% ostial left main stenosis, 90-95% distal left main stenosis, 95-99% ostial LAD stenosis, and mild/moderate stenoses in the RCA. That same day he was successfully operated by Dr. Arvid Right and underwent CABG x3 with a LIMA to the LAD, SVG to the OM, and SVG to the PDA. He had a unremarkable postoperative course and was discharged 4 days later.  He saw [name] on 01/27/13 and was doing well. I saw him in followup on 03/02/2013 and he  participating  in cardiac rehabilitation. When I saw him in September 2014, he was in sinus rhythm and has and had resumed a good level of activity.   In January 2015, he was found to be in atrial flutter at cardiac rehabilitation. He was unaware of his heart rhythm being abnormal. He had developed a URI over the holidays prior to that and had just completed a steroid dose pack. Xarelto 20 mg was added to his medical regimen. He did undergo subsequent blood work on January 20 which showed normal renal function and normal electrolytes. Liver function studies were normal. Hemoglobin was 13.7 hematocrit 41.7. Platelets were normal. TSH was normal at 2.63. Magnesium was normal at 2.0.  He underwent successful cardioversion for his  atrial flutter on 11/12/2013 and he cardioverted to sinus rhythm at 100 J.  Since his cardioversion, he is unaware of any recurrent rhythm disturbance.   He is in the maintenance phase of cardiac rehabilitation.  When I  saw him in June 2015, he told me that he had noticed several episodes of a vague chest sensation particularly when he rolls his garbage can back up a hill at home.    On 03/18/2014, he underwent a nuclear perfusion study, which showed reduced exercise capacity with a workload of only 5.9.  He did experience mild shortness of breath.  There was no significant ST depression, but he had developed transient left bundle branch block.  The scan was interpreted at low as low risk.  Ejection fraction was 65% without wall motion abnormalities.  An echo Doppler study showed an EF of 60-65%.  There were no regional wall motion abnormalities.  He had mild biatrial enlargement.  Mr. Safron currently is playing golf at least 2 times per week and occasionally 3.  He is in the maintenance phase of cardiac rehabilitation.  He does admit to some weight gain.  He denies any episodes of chest pain, particularly during the cardiac rehabilitation program.  He has noticed some mild shortness of breath with deep breathing and some vague pressure in his chest which is short-lived walking up hills when he walks fast with his wife.  He has noticed some mild shortness of breath walking up steep hills in the golf [place] without  chest pressure.  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 2016.  When I saw him in 2017 he was anemic and had significantly reduced iron saturation at 6%, and his serum ferritin was 7.  I referred him for GI evaluation and underwent both colonoscopy and endoscopy by Dr. [name] and 4 polyps were removed from his colon.  He was not having any active bleeding.  He does note improved energy.  He had taken Niferex initially and transitioned to over-the-counter iron. He has  more energy.  He is continuing with cardiac rehabilitation.  He underwent a follow-up abdominal ultrasound on 06/09/2015 which showed a patent aortobifemoral bypass graft without focal dilatation or stenosis.   In the late summer of 2017 he was at the beach for several weeks and played golf heat without chest pain.  However with fast walking he noted chest pressure.  For the past month he has had a cough.  He has a ventral hernia.  When I saw him, his ECG showed progressive PR prolongation.  I reduced his Lopressor back to 25 mg daily and added amlodipine 5 mg blood pressure and his chest tightness.  I scheduled him for a nuclear perfusion study which was done on 07/23/2016.  Ejection fraction was 62%.  There were no ECG changes ischemia, but there was a rare PVC.  He had normal perfusion without scar or ischemia.  His follow-up laboratory showed significant improvement with his iron saturation, now at 27%.  Lipid studies revealed a total cholesterol 164, triglycerides 122, HDL 40, and LDL 100.    When I saw him in January 2018, I suggested the addition of Zetia to his atorvastatin.  In the past.  He did not tolerate 80 mg, atorvastatin, and also had myalgias on 40 mg.  I also discussed plaque regression studies with Repatha.  He developed recurrent atrial fibrillation and had multiple evaluations in the A. fib clinic commencing in July 2018.  Ultimately, on 05/13/2017, he underwent successful cardioversion with restoration of sinus rhythm.  He saw [name] in follow-up of his cardioversion and was maintaining sinus rhythm.  There was discussion concerning his EtOH use and reduction in consumption.  He typically has at least 1 or more vodka every day.    When I lsaw him in February 2019 he was continuing to feel well and denied any recurrent episodes of atrial fibrillation.  His last echo Doppler study in October 2018 showed normal LV systolic and diastolic function.  He had biatrial enlargement with  mild aortic valve stenosis, and mean gradient of 14 and peak gradient of 25.  On follow-up abdominal aortic Doppler study the largest aortic measurement was 2.7 cm.   He was playing golf several days per week and was continue to participate in cardiac rehab.  On the evening of May 12, 2018 he was going up steps to bed developed an episode of full sensation in his throat chest with some radiate this was associated with palpitations.  Symptoms lasted approximately 10 minutes.  He was seen in the following morning relation clinic and was found to be in atrial flutter with a controlled ventricular rate.  He was scheduled to undergo DC cardioversion but upon arrival for his cardioversion procedure his ECG showed that he had converted back to sinus rhythm and had PACs.  I last saw on May 29, 2018.  He admitted to occasional lightheadedness and sensation of head fog.  He denied any exertional  chest tightness.  He was continuing to drink occasional vodka but admits that his drinking is less.  He was sleeping well.  He denied any significant snoring or daytime sleepiness.  Elder Love revealed normal thyroid function studies.  History was normal although glucose was minimally increased at 103.  CBC was normal.  LDL cholesterol was 71 but triglycerides are elevated at 173 total cholesterol 143 and HDL 37.  Because of increased cardiac murmur on auscultation he underwent a follow-up echo Doppler study in June 25, 2018.  This continued to show excellent ejection fraction at 60 to 65% with grade 2 diastolic dysfunction.  There was mild aortic valve stenosis with a mean gradient of 14, peak instantaneous gradient of 29, and calculated valve area of 1.6 8 cm.  When I saw him last on July 07, 2018 he was maintaining sinus rhythm and was without chest pain PND orthopnea although he still noted mild shortness of breath when bending over.  He was playing golf several days per week.    Since I last saw him in October  2019  he apparently has had recurrent issues with atrial fibrillation.  He has been seen by Kerin Ransom, PA, Almyra Deforest, [place],  [name], NP and Coletta Memos, NP.  Apparently he has been in longstanding persistent atrial fibrillation for well over a year.  He has been on chronic anticoagulation with Eliquis.  When I saw him in April 2021 after not having seen him in 18 months he denied any recurrent anginal symptomatology.  He was continuing to play golf 2 days/week and had experienced some shortness of breath with uphill walking.  He was walking his dog daily.  Since he had not had an echo in over 2 years I recommended a follow-up echo Doppler study which was done on 02/14/2020.  This revealed an EF of 60 to 65%.  He had mildly increased pulmonary artery systolic pressure [id number] mm.  There was evidence for mild aortic valve stenosis with a mean gradient of 13.8 and peak gradient of 22.8.  Pulmonary artery systolic pressure was mildly increased at 36.5.  He had normal atrial dimensions.    I saw him in October 2021.  Unfortunately, recently his wife broke her hip and required hip surgery and rehab therapy.  He has persistent atrial fibrillation and has been on Eliquis 5 mg twice a day.  At that time, his ECG showed atrial fibrillation at 54 bpm.  He continued to be on amlodipine 2.5 mg, irbesartan 150 mg and furosemide 20 mg daily.  He is on atorvastatin 40 mg for hyperlipidemia.  He is on Metformin 500 mg daily.    He underwent an echo Doppler study on January 08, 2021 which showed an EF of 55 to 60% without wall motion abnormalities.  There was mild biatrial enlargement.  There was mild aortic stenosis with a mean gradient of 14 mm and estimated AVA at 1.65 cm.  Abdominal aorta Doppler study on January 05, 2021 showed moderate atherosclerosis throughout the iliac arteries.  The exam was limited due to overlying bowel.  There was no evidence for obvious recurrent aneurysm.  He called our office in May 2022 with  reports of bradycardia with rates in the 40s as well as fatigability.  He was evaluated by Sande Rives on Feb 01, 2021.  He reported heart rates in the 40s with significant fatigability.  He subsequently wore a Zio patch monitor from May 9 through May 23.  He was in  atrial fibrillation throughout the monitor with rates ranging from the lowest at 32 on May 16 at 2:21 AM and the highest at 43 at 12:45 PM on May 10 with an average rate of 50 bpm.  He had bundle branch block/IVCD D.  There are occasional isolated PVCs without couplets or triplets.  I saw him on March 19, 2021 he continued to have periods of fatigability.    Typically his heart rate is in the low to mid 40s.  However on June 18 at 10:43 AM he reported his heart rate was around 38 bpm.  He also has noted some left calf claudication symptomatology.  He admits to decreased energy.  He still playing golf 2 days/week.  He denies recurrent chest pain.  During that evaluation, his blood pressure was stable on amlodipine 5 mg, irbesartan 150 mg and he was taking furosemide 20 mg daily.  There were no signs of volume overload and I suggested he try decreasing furosemide to every other day or as needed.  With his progressive left calf claudication symptomatology I recommended a lower extremity arterial Doppler study.  He underwent his Doppler study which raise concern for progressive PVD and he was referred to Dr. Gwenlyn Found.  He subsequently was evaluated by Dr. Monica Martinez of [facility] since Dr. Kellie Simmering had previously done his open AAA repair with aorto by iliac bypass.  Initially, a conservative approach was recommended with plans for follow-up in approximately 6 months.  If intervention is to be performed a brachial approach in the left arm to treat his left SFA high-grade stenosis would be necessary.  I saw Mr. Franc on September 27, 2021 at which time he felt well and denied any chest pain.   He continues to be in longstanding persistent atrial fibrillation.   He denies presyncope or syncope.  His wife had recently tested positive for COVID before Christmas and he tested positive as well and received treatment with Paxlovid and tested -2 days prior to this evaluation.  He was having claudication issues and remotely had undergone open AAA repair with aorto by iliac bypass surgery by Dr. Kellie Simmering and had developed left calf claudication with Doppler imaging on April 11, 2021 demonstrating high-grade proximal right SFA stenosis with velocity of 411 consistent with 75 to 99% stenosis and on the left and mid SFA high-grade stenosis with velocity of 406 also consistent with 75 to 99% stenosis.  He was evaluated by Dr. Carlis Abbott.  During his evaluation I discussed further aggressive lipid-lowering therapy and recommended a retrial of adding Zetia 10 mg to his atorvastatin 40 mg.  Remotely he could not tolerate atorvastatin 80 mg.  He underwent a follow-up echo Doppler study in February 2023 which showed LV function with EF 60 to 65%, mild LVH.  There was severe left atrial dilatation with moderate right atrial dilatation.  There was moderate aortic valve stenosis with mild MR care.  Aortic peak gradient was 35 mm with mean gradient of 20 and estimated valve area 1.7 cm.  There was mild dilation of his ascending aorta.  When I saw Mr. Pepin on Jan 29, 2022  he denied any recurrent chest pain.  He had noted some mild occasional shortness of breath episodes.   He continues to play golf approximately 2 days/week.  He uses a golf cart with a flag to allow him to take the cart to his ball where it is located.  He is planning to see Dr. Carlis Abbott for follow-up vascular surgery evaluation  in light of his left leg greater than right claudication symptoms.  He admits to some shortness of breath particularly when he leans over.  He was supposed to have had an abdominal aortic aneurysm follow-up Doppler study which had not yet been done.  He has not had recent laboratory.  He is and he continues to  be in longstanding persistent atrial fibrillation and is on Eliquis anticoagulation.  Currently for the past week he ran out of his amlodipine which had been 5 mg daily and he continues to take furosemide 20 mg in addition to irbesartan 150 mg daily for hypertension.  He is on Zetia 10 mg in addition to atorvastatin 40 mg for hyperlipidemia.  During that evaluation, I recommended he resume amlodipine with target blood pressure less than 130/80.  He was to undergo follow-up abdominal aortic aneurysm duplex imaging.  I last saw him on June 24, 2022.  He continues to feel well.   In July and August, 2023 he had a longstanding bronchial infection and required antibiotic therapy for approximately 5 weeks.  He denies any chest pain.  At times he notes some balance issues.  He experiences occasional left lower extremity claudication symptomatology.  He will be following up with Dr. Carlis Abbott.  He sees Dr. Inda Merlin for primary care.  He is no longer taking furosemide.  He continues to be in permanent atrial fibrillation with a controlled ventricular rate on Eliquis anticoagulation.  Since I last saw him, he underwent an echo Doppler study yesterday on November 04, 2022.  EF was normal at 55 to 60% without wall motion abnormalities.  Pulmonary artery systolic pressure was moderately elevated at 52.9 mmHg.  There was mild biatrial enlargement.  He had severe calcification of the aortic valve with mild aortic valve stenosis.  Mean gradient was 19 mmHg, peak gradient 31 mmHg with estimated valve area 1.63.  There is mild dilation of his ascending aorta at 38 mm.  Presently, Mr. Owens Shark feels well.  He denies chest pain or shortness of breath.  He denies presyncope or syncope or palpitations.  His atrial fibrillation rate has been controlled.  He admits to some trace swelling.  He continues to play golf regularly.  He has PVD and is followed by Dr. Carlis Abbott.  He presents for follow-up evaluation.   Past Medical History:   Diagnosis Date   CAD (coronary artery disease), severe needing CABG 01/19/2013   Chest pain    2D ECHO, 06/16/2012 - EF 50-55%, borderline low left ventricular systolic function   Coronary artery disease    Dyspnea on exertion    LEXISCAN, 06/17/2012 - fixed inferior bowel artifact and basal septal defect, probably related to IVCD/incomplete LBBB, no reversible ischemia, post-stress EF 66%   History of AAA (abdominal aortic aneurysm) repair    ABDOMINAL AORTIC DOPPLER, 06/16/2013 - normal   HTN (hypertension) 01/19/2013   Hyperlipidemia LDL goal < 70 01/19/2013   Hypertension    S/P CABG x 3 01/19/2013    Past Surgical History:  Procedure Laterality Date   CARDIAC CATHETERIZATION  01/15/2013   Severe coronary obstructive disease with 60-70% ostial left main and 90-85% distal LAD stenosis and mild-moderate stenoses in RCA, recommended for CABG   CARDIOVERSION N/A 11/12/2013   Procedure: CARDIOVERSION;  Surgeon: Troy Sine, MD;  Location: [facility] ENDOSCOPY;  Service: Cardiovascular;  Laterality: N/A;  11:16 Lido 27m, Propofol 1275mIV 11:19 synch 100 joules conversion conversion to NSR....   CARDIOVERSION N/A 05/13/2017   Procedure: CARDIOVERSION;  Surgeon: Acie Fredrickson Wonda Cheng, MD;  Location: [facility] ENDOSCOPY;  Service: Cardiovascular;  Laterality: N/A;   CORONARY ARTERY BYPASS GRAFT N/A 01/15/2013   Procedure: Coronary Artery Bypass Graft times three using Right Greater Saphenous Vein Graft harvested endoscopically and Left Internal Mammary Artery and;  Surgeon: Gaye Pollack, MD;  Location: [place] OR;  Service: Open Heart Surgery;  Laterality: N/A;   INTRAOPERATIVE TRANSESOPHAGEAL ECHOCARDIOGRAM  01/15/2013   Procedure: INTRAOPERATIVE TRANSESOPHAGEAL ECHOCARDIOGRAM;  Surgeon: Gaye Pollack, MD;  Location: [place] OR;  Service: Open Heart Surgery;;   LEFT HEART CATHETERIZATION WITH CORONARY ANGIOGRAM N/A 01/15/2013   Procedure: LEFT HEART CATHETERIZATION WITH CORONARY ANGIOGRAM;  Surgeon: Troy Sine, MD;   Location: [facility] CATH LAB;  Service: Cardiovascular;  Laterality: N/A;   VASCULAR SURGERY      Allergies  Allergen Reactions   Penicillin G     Other reaction(s): rash   Penicillins Rash    Has patient had a PCN reaction causing immediate rash, facial/tongue/throat swelling, SOB or lightheadedness with hypotension: no Has patient had a PCN reaction causing severe rash involving mucus membranes or skin necrosis: no Has patient had a PCN reaction that required hospitalization: no Has patient had a PCN reaction occurring within the last 10 years: yes If all of the above answers are "NO", then may proceed with Cephalosporin use.     Current Outpatient Medications  Medication Sig Dispense Refill   amLODipine (NORVASC) 5 MG tablet TAKE 1 TABLET(5 MG) BY MOUTH DAILY 90 tablet 3   Ascorbic Acid (VITAMIN C) 1000 MG tablet Take 1,000 mg by mouth daily. 1 Tablet Daily     atorvastatin (LIPITOR) 40 MG tablet TAKE 1 TABLET BY MOUTH EVERY DAY 90 tablet 2   cholecalciferol (VITAMIN D3) 25 MCG (1000 UNIT) tablet Take 1,000 Units by mouth daily. 1 Tablet Daily     COVID-19 mRNA bivalent vaccine, Pfizer, (PFIZER COVID-19 VAC BIVALENT) injection Inject into the muscle. 0.3 mL 0   COVID-19 mRNA vaccine 2023-2024 (COMIRNATY) syringe Inject into the muscle. 0.3 mL 0   ELIQUIS 5 MG TABS tablet TAKE 1 TABLET BY MOUTH TWICE DAILY 180 tablet 1   ezetimibe (ZETIA) 10 MG tablet TAKE 1 TABLET(10 MG) BY MOUTH DAILY 30 tablet 8   influenza vaccine adjuvanted (FLUAD QUADRIVALENT) 0.5 ML injection Inject into the muscle. 0.5 mL 0   irbesartan (AVAPRO) 150 MG tablet TAKE 1 TABLET BY MOUTH DAILY 90 tablet 3   latanoprost (XALATAN) 0.005 % ophthalmic solution 1 drop at bedtime.     metFORMIN (GLUCOPHAGE) 500 MG tablet Take 500 mg by mouth daily.     Multiple Vitamin (MULTIVITAMIN WITH MINERALS) TABS Take 1 tablet by mouth daily.     No current facility-administered medications for this visit.    Socially he is  re-married. His first wife of 42 years passed away. He previously had smoked cigars prior to his bypass surgery but has not had any tobacco use since.  He continues to play golf at least 2 times per week.  He does a lot of leg exercise with cardiac rehabilitation.  ROS General: Negative; No fevers, chills, or night sweats; fatigability HEENT: Negative; No changes in vision or hearing, sinus congestion, difficulty swallowing Pulmonary: URI symptoms during the winter; recent prolonged antibiotics for bronchial infection Cardiovascular: Mild shortness of breath with uphill walking; see history of present illness;  left calf claudication with high-grade proximal right SFA stenosis and left mid SFA stenoses. GI: Negative; No nausea, vomiting, diarrhea, or abdominal  pain GU: Negative; No dysuria, hematuria, or difficulty voiding Musculoskeletal: Negative; no myalgias, joint pain, or weakness Hematologic/Oncology: Negative; no easy bruising, bleeding Endocrine: Negative; no heat/cold intolerance; no diabetes Neuro: Negative; no changes in balance, headaches Skin: Removal of small left forearm melanoma. Psychiatric: Negative; No behavioral problems, depression Sleep: Negative; No snoring, daytime sleepiness, hypersomnolence, bruxism, restless legs, hypnogognic hallucinations, no cataplexy Other comprehensive 14 point system review is negative.   PE BP (!) 148/72   Pulse (!) 56   Ht 6' 1"$  (1.854 m)   Wt 239 lb (108.4 kg)   SpO2 96%   BMI 31.53 kg/m    Repeat blood pressure by me was 138/74  Wt Readings from Last 3 Encounters:  11/05/22 239 lb (108.4 kg)  06/24/22 234 lb (106.1 kg)  01/29/22 230 lb 12.8 oz (104.7 kg)   General: Alert, oriented, no distress.  Skin: normal turgor, no rashes, warm and dry HEENT: Normocephalic, atraumatic. Pupils equal round and reactive to light; sclera anicteric; extraocular muscles intact; Fundi ** Nose without nasal septal hypertrophy Mouth/Parynx  benign; Mallinpatti scale 3 Neck: No JVD, no carotid bruits; normal carotid upstroke Lungs: clear to ausculatation and percussion; no wheezing or rales Chest wall: without tenderness to palpitation Heart: PMI not displaced, RRR, s1 s2 normal, 2/6 systolic murmur in aortic area, no diastolic murmur, no rubs, gallops, thrills, or heaves Abdomen: soft, nontender; no hepatosplenomehaly, BS+; abdominal aorta nontender and not dilated by palpation. Back: no CVA tenderness Pulses 2+; slightly decreased 1+ left dorsalis pedis Musculoskeletal: full range of motion, normal strength, no joint deformities Extremities: Trivial right ankle edema no clubbing cyanosis, Homan's sign negative  Neurologic: grossly nonfocal; Cranial nerves grossly wnl Psychologic: Normal mood and affect  November 05, 2022 ECG (independently read by me): Atrial fibrillation at 56, LBBB   June 24, 2022 ECG (independently read by me): Atrial fibrillation at 52, Left axis deviation  Jan 29, 2022 ECG (independently read by me): Atrial fibrillation at 57; Right axis deviation, Nonspecific IVCD   September 27, 2021 ECG (independently read by me):  Atrial fibrillation at 53, LBBB  March 19, 2021 ECG (independently read by me): Atrial fibrillation at 47; LAD, QTc 430 msec  October 2021 ECG (independently read by me): Atrial fibrillation at 54; PRWP; QTc 428 bmsec  April 2021 ECG (independently read by me): Atrial fibrillation with an average ventricular rate of 53 bpm with a range of in the 60s to 40s.  One isolated PVC.  Nonspecific interventricular block.  July 07, 2020 ECG (independently read by me): Sinus bradycardia with first-degree AV block.  PR interval 392 ms.  Incomplete left bundle branch block.  May 29, 2018 ECG (independently read by me): Sinus rhythm with first-degree AV block at 72 bpm with a PR interval of 312 ms.  Occasional PACs.  February 2019 ECG (independently read by me): Sinus rhythm at 63 bpm.   First-degree AV block with a PR interval at 344 ms.  PAC.  Nonspecific interventricular conduction delay.  October 2018 ECG (independently read by me): Sinus rhythm with first-degree AV block with a PR interval of 314 ms.  Nonspecific interventricular block.  QTc interval 431  January 2018 ECG (independently read by me): Normal sinus rhythm with first-degree AV block.  PR interval 300 ms.  Nonspecific interventricular conduction delay.  October 2017 ECG (independently read by me): Normal sinus rhythm at 66 bpm.  First-degree block with a PR interval at 290 ms.  Nonspecific interventricular block.  03/02/2015 ECG (independently  read by me): Sinus rhythm at 75 bpm.  Occasional unifocal PVCs.  Nonspecific interventricular block.  Prior October 2015 ECG (independently read by me): Normal sinus rhythm at 67 beats per minute.  First degree A-V block with PR interval at 290 ms.  QTc interval 439 ms.  No ST segment changes.  Prior June 2015 ECG (independently read by me): Normal sinus rhythm at 68 beats per minute with first degree AV block with a PR interval at 284 ms.  QTc interval 435 ms.  No significant ST-T changes.  T-wave inversion in aVL  ECG today (independently read by me):  Normal sinus rhythm at 71 beats per minute. First create a block with PR interval at 240 ms. QTc interval normal at 4-5 ms.  Prior 10/27/13 ECG (independently read by me): Atrial flutter with variable rate but predominantly 4-1 with occasional 3-1 block; average heart rate 70 beats per minute  Prior ECG from 06/04/2013: Sinus rhythm with first-degree AV block. Nonspecific interventricular block  LABS:    Latest Ref Rng & Units 06/19/2022   10:17 AM 11/08/2021    9:53 AM 02/01/2021    3:47 PM  BMP  Glucose 70 - 99 mg/dL 97  93  95   BUN 8 - 27 mg/dL 16  14  20   $ Creatinine 0.76 - 1.27 mg/dL 0.89  0.93  1.05   BUN/Creat Ratio 10 - 24 18  15  19   $ Sodium 134 - 144 mmol/L 141  140  140   Potassium 3.5 - 5.2 mmol/L 4.6   4.9  4.7   Chloride 96 - 106 mmol/L 104  103  103   CO2 20 - 29 mmol/L 22  23  24   $ Calcium 8.6 - 10.2 mg/dL 9.5  9.4  9.9       Latest Ref Rng & Units 06/19/2022   10:17 AM 11/08/2021    9:53 AM 02/01/2021    3:47 PM  Hepatic Function  Total Protein 6.0 - 8.5 g/dL 7.1  6.8  7.1   Albumin 3.7 - 4.7 g/dL 4.6  4.4  4.4   AST 0 - 40 IU/L 19  18  15   $ ALT 0 - 44 IU/L 17  18  18   $ Alk Phosphatase 44 - 121 IU/L 99  77  83   Total Bilirubin 0.0 - 1.2 mg/dL 1.2  1.1  0.9       Latest Ref Rng & Units 11/08/2021    9:53 AM 03/19/2021   12:00 PM 01/10/2020   10:35 AM  CBC  WBC 3.4 - 10.8 x10E3/uL 9.1  9.0  7.4   Hemoglobin 13.0 - 17.7 g/dL 12.7  13.1  13.8   Hematocrit 37.5 - 51.0 % 39.9  40.7  41.6   Platelets 150 - 450 x10E3/uL 248  259  228    Lab Results  Component Value Date   MCV 81 11/08/2021   MCV 83 03/19/2021   MCV 87 01/10/2020   Lab Results  Component Value Date   TSH 1.970 11/08/2021   Lipid Panel     Component Value Date/Time   CHOL 113 06/19/2022 1017   CHOL 111 03/01/2015 1033   TRIG 74 06/19/2022 1017   TRIG 90 03/01/2015 1033   HDL 39 (L) 06/19/2022 1017   HDL 41 03/01/2015 1033   CHOLHDL 2.9 06/19/2022 1017   CHOLHDL 4.1 07/19/2016 0949   VLDL 24 07/19/2016 0949   LDLCALC 59 06/19/2022 1017  [place] 52 03/01/2015 1033     RADIOLOGY:   ECHO: 11/04/2022   1. Left ventricular ejection fraction, by estimation, is 55 to 60%. The  left ventricle has normal function. The left ventricle has no regional  wall motion abnormalities. Left ventricular diastolic parameters are  indeterminate.   2. Right ventricular systolic function is mildly reduced. The right  ventricular size is moderately enlarged. There is moderately elevated  pulmonary artery systolic pressure. The estimated right ventricular  systolic pressure is 99991111 mmHg.   3. Left atrial size was mildly dilated.   4. Right atrial size was mildly dilated.   5. The mitral valve is normal in structure.  Trivial mitral valve  regurgitation. No evidence of mitral stenosis. Moderate mitral annular  calcification.   6. The aortic valve is tricuspid. There is severe calcifcation of the  aortic valve. Aortic valve regurgitation is trivial. Mild aortic valve  stenosis. Aortic valve area, by VTI measures 1.63 cm. Aortic valve mean  gradient measures 19.0 mmHg.   7. Aortic dilatation noted. There is mild dilatation of the ascending  aorta, measuring 38 mm.   8. The inferior vena cava is dilated in size with >50% respiratory  variability, suggesting right atrial pressure of 8 mmHg.   9. The patient was in atrial fibrillation.    ECHO: 11/04/2022  1. Left ventricular ejection fraction, by estimation, is 55 to 60%. The  left ventricle has normal function. The left ventricle has no regional  wall motion abnormalities. Left ventricular diastolic parameters are  indeterminate.   2. Right ventricular systolic function is mildly reduced. The right  ventricular size is moderately enlarged. There is moderately elevated  pulmonary artery systolic pressure. The estimated right ventricular  systolic pressure is 99991111 mmHg.   3. Left atrial size was mildly dilated.   4. Right atrial size was mildly dilated.   5. The mitral valve is normal in structure. Trivial mitral valve  regurgitation. No evidence of mitral stenosis. Moderate mitral annular  calcification.   6. The aortic valve is tricuspid. There is severe calcifcation of the  aortic valve. Aortic valve regurgitation is trivial. Mild aortic valve  stenosis. Aortic valve area, by VTI measures 1.63 cm. Aortic valve mean  gradient measures 19.0 mmHg.   7. Aortic dilatation noted. There is mild dilatation of the ascending  aorta, measuring 38 mm.   8. The inferior vena cava is dilated in size with >50% respiratory  variability, suggesting right atrial pressure of 8 mmHg.   9. The patient was in atrial fibrillation.    IMPRESSION:  1. Nonrheumatic  aortic valve stenosis   2. CAD in native artery   3. Hx of CABG   4. Primary hypertension   5. S/P AAA repair   6. Longstanding persistent atrial fibrillation ([place])   7. Anticoagulation adequate   8. Former smoker   27. Bronchitis   10. Type 2 diabetes mellitus with other circulatory complication, without long-term current use of insulin ([place])   11. [facility])     ASSESSMENT AND PLAN: Mr. Lessard is an active 89 year old white male who underwent emergent CABG revascularization surgery after cardiac catheterization on 01/15/2013 demonstrated severe life threatening coronary anatomy.  He  developed atrial flutter in January 2015 and was started on anticoagulation with Xarelto and underwent cardioversion.  He developed recurrent episodes of atrial fibrillation in 2018 and had maintained sinus rhythm since his repeat cardioversion on 05/13/2017 but developed recurrent atrial flutter 1 year later in May 13, 2018 was found to have a flutter with controlled rate.  When seen in October 2019 he was maintaining sinus rhythm.  However, subsequently he has developed longstanding persistent atrial fibrillation and has been maintained on Eliquis for anticoagulation.  An echo Doppler study in 2019 showed EF 60 to 65% with mean aortic gradient 14 consistent with mild aortic stenosis and grade 2 diastolic dysfunction.  In May 2020 1 aortic stenosis remains mild with mean gradient of 13.8 and peak gradient of 22.8.  There was mild pulmonary hypertension with PA systolic pressure of [id number].  His echo in April 2022 showed a mean gradient of 14.  His echo from February 2022  showed a mean gradient of 20 and peak aortic gradient of 35 mm.  There was severe left atrial dilatation and moderate right atrial dilatation.  His echo in February 2023 showed EF 60 to 65% with moderate aortic stenosis, mean gradient 20 peak gradient 35 mmHg.  His most recent echo was done yesterday, November 04, 2022 which showed an EF of 55 to  60%.  There was moderately increased PA pressure at 52.9 mmHg.  Aortic stenosis remained mild with a mean gradient of 19 and peak gradient of 31 mmHg with estimated valve area at 1.63 cm. His most recent abdominal aortic ultrasound was stable with abdominal aorta at 2.6 cm with no thrombus in the IVC.  He continues to have mild issues with left leg claudication and has seen Dr. Carlis Abbott in the Vein and Vascular surgery.  He continues to have permanent A-fib with controlled rate and has been on chronic Eliquis anticoagulation.  His blood pressure today on repeat by me was 138/74 on amlodipine 5 mg, irbesartan 150 mg daily.  He is on atorvastatin 40 mg and Zetia 10 mg for hyperlipidemia.  Lipid panel in September 2023 showed total cholesterol 113, LDL cholesterol 59, triglycerides 74 and HDL 39.  He is diabetic on metformin 500 mg daily.  He will continue current therapy with follow-up with Dr. Carlis Abbott for PVD.  I will see him in 6 months for reevaluation or sooner as needed.     Troy Sine, MD, [facility]  11/11/2022 9:56 AM

## 2022-11-05 NOTE — Patient Instructions (Signed)
Medication Instructions:  NO CHANGES  *If you need a refill on your cardiac medications before your next appointment, please call your pharmacy*  Testing/Procedures: Non-Contrast Chest CT at [facility] [place], [place] [address]    Follow-Up: At [facility], you and your health needs are our priority.  As part of our continuing mission to provide you with exceptional heart care, we have created designated Provider Care Teams.  These Care Teams include your primary Cardiologist (physician) and Advanced Practice Providers (APPs -  Physician Assistants and Nurse Practitioners) who all work together to provide you with the care you need, when you need it.  We recommend signing up for the patient portal called "MyChart".  Sign up information is provided on this After Visit Summary.  MyChart is used to connect with patients for Virtual Visits (Telemedicine).  Patients are able to view lab/test results, encounter notes, upcoming appointments, etc.  Non-urgent messages can be sent to your provider as well.   To learn more about what you can do with MyChart, go to [email or web address].    Your next appointment:   6 month(s)  Provider:   Shelva Majestic, MD

## 2022-11-11 ENCOUNTER — Encounter: Payer: Self-pay | Admitting: Cardiovascular Disease

## 2022-11-30 ENCOUNTER — Ambulatory Visit (HOSPITAL_BASED_OUTPATIENT_CLINIC_OR_DEPARTMENT_OTHER)
Admission: RE | Admit: 2022-11-30 | Discharge: 2022-11-30 | Disposition: A | Discharge status: Home / Self Care | Payer: Medicare Other | Source: Ambulatory Visit | Attending: Cardiovascular Disease | Admitting: Cardiovascular Disease

## 2022-11-30 DIAGNOSIS — R918 Other nonspecific abnormal finding of lung field: Secondary | ICD-10-CM | POA: Diagnosis not present

## 2022-11-30 DIAGNOSIS — J40 Bronchitis, not specified as acute or chronic: Secondary | ICD-10-CM | POA: Diagnosis not present

## 2022-11-30 DIAGNOSIS — Z87891 Personal history of nicotine dependence: Secondary | ICD-10-CM

## 2022-12-02 ENCOUNTER — Other Ambulatory Visit: Payer: Self-pay

## 2022-12-02 DIAGNOSIS — R918 Other nonspecific abnormal finding of lung field: Secondary | ICD-10-CM

## 2022-12-10 ENCOUNTER — Other Ambulatory Visit: Payer: Self-pay

## 2022-12-18 ENCOUNTER — Other Ambulatory Visit: Payer: Self-pay | Admitting: Cardiovascular Disease

## 2022-12-30 DIAGNOSIS — K080 Exfoliation of teeth due to systemic causes: Secondary | ICD-10-CM | POA: Diagnosis not present

## 2023-01-24 DIAGNOSIS — E785 Hyperlipidemia, unspecified: Secondary | ICD-10-CM | POA: Diagnosis not present

## 2023-01-24 DIAGNOSIS — E559 Vitamin D deficiency, unspecified: Secondary | ICD-10-CM | POA: Diagnosis not present

## 2023-01-24 DIAGNOSIS — Z0000 Encounter for general adult medical examination without abnormal findings: Secondary | ICD-10-CM | POA: Diagnosis not present

## 2023-01-24 DIAGNOSIS — D6869 Other thrombophilia: Secondary | ICD-10-CM | POA: Diagnosis not present

## 2023-01-24 DIAGNOSIS — E119 Type 2 diabetes mellitus without complications: Secondary | ICD-10-CM | POA: Diagnosis not present

## 2023-01-24 DIAGNOSIS — Z1331 Encounter for screening for depression: Secondary | ICD-10-CM | POA: Diagnosis not present

## 2023-01-24 DIAGNOSIS — E1151 Type 2 diabetes mellitus with diabetic peripheral angiopathy without gangrene: Secondary | ICD-10-CM | POA: Diagnosis not present

## 2023-01-24 DIAGNOSIS — I25119 Atherosclerotic heart disease of native coronary artery with unspecified angina pectoris: Secondary | ICD-10-CM | POA: Diagnosis not present

## 2023-01-24 DIAGNOSIS — I4892 Unspecified atrial flutter: Secondary | ICD-10-CM | POA: Diagnosis not present

## 2023-01-24 DIAGNOSIS — Z79899 Other long term (current) drug therapy: Secondary | ICD-10-CM | POA: Diagnosis not present

## 2023-01-24 LAB — HEMOGLOBIN A1C: A1c: 6.2

## 2023-01-24 LAB — COMPREHENSIVE METABOLIC PANEL: EGFR: 82

## 2023-01-24 LAB — LAB REPORT - SCANNED
Albumin, Urine POC: 5.72
Creatinine, POC: 101 mg/dL
Microalb Creat Ratio: 56.8

## 2023-02-05 ENCOUNTER — Other Ambulatory Visit: Payer: Self-pay | Admitting: Cardiovascular Disease

## 2023-02-05 DIAGNOSIS — K080 Exfoliation of teeth due to systemic causes: Secondary | ICD-10-CM | POA: Diagnosis not present

## 2023-02-12 DIAGNOSIS — K080 Exfoliation of teeth due to systemic causes: Secondary | ICD-10-CM | POA: Diagnosis not present

## 2023-02-26 DIAGNOSIS — K080 Exfoliation of teeth due to systemic causes: Secondary | ICD-10-CM | POA: Diagnosis not present

## 2023-03-16 ENCOUNTER — Other Ambulatory Visit: Payer: Self-pay | Admitting: Cardiovascular Disease

## 2023-03-20 ENCOUNTER — Other Ambulatory Visit (HOSPITAL_COMMUNITY): Payer: Self-pay | Admitting: Cardiovascular Disease

## 2023-03-20 DIAGNOSIS — I4819 Other persistent atrial fibrillation: Secondary | ICD-10-CM

## 2023-03-20 NOTE — Telephone Encounter (Signed)
Prescription refill request for Eliquis received. Indication: AF Last office visit: 11/05/22  Bishop Limbo MD Scr: 0.89 on 01/24/23  Epic Age: 89 Weight: 108.4kg  Based on above findings Eliquis 5mg  twice daily is the appropriate dose.  Refill approved.

## 2023-03-31 ENCOUNTER — Other Ambulatory Visit: Payer: Self-pay | Admitting: *Deleted

## 2023-03-31 DIAGNOSIS — I739 Peripheral vascular disease, unspecified: Secondary | ICD-10-CM

## 2023-04-09 DIAGNOSIS — H401131 Primary open-angle glaucoma, bilateral, mild stage: Secondary | ICD-10-CM | POA: Diagnosis not present

## 2023-04-15 ENCOUNTER — Ambulatory Visit (HOSPITAL_COMMUNITY)
Admission: RE | Admit: 2023-04-15 | Discharge: 2023-04-15 | Disposition: A | Discharge status: Home / Self Care | Payer: Medicare Other | Source: Ambulatory Visit | Attending: Vascular Surgery | Admitting: Vascular Surgery

## 2023-04-15 ENCOUNTER — Ambulatory Visit: Payer: Medicare Other | Admitting: Vascular Surgery

## 2023-04-15 ENCOUNTER — Encounter: Payer: Self-pay | Admitting: Vascular Surgery

## 2023-04-15 VITALS — BP 143/59 | HR 59 | Temp 98.2°F | Resp 18 | Ht 73.5 in | Wt 235.0 lb

## 2023-04-15 DIAGNOSIS — I739 Peripheral vascular disease, unspecified: Secondary | ICD-10-CM | POA: Insufficient documentation

## 2023-04-15 DIAGNOSIS — I70219 Atherosclerosis of native arteries of extremities with intermittent claudication, unspecified extremity: Secondary | ICD-10-CM | POA: Insufficient documentation

## 2023-04-15 DIAGNOSIS — I70212 Atherosclerosis of native arteries of extremities with intermittent claudication, left leg: Secondary | ICD-10-CM | POA: Diagnosis not present

## 2023-04-15 LAB — VAS US ABI WITH/WO TBI: Right ABI: 0.85

## 2023-04-15 MED ORDER — CILOSTAZOL 100 MG PO TABS: 100.0000 mg | ORAL_TABLET | Freq: Two times a day (BID) | ORAL | 11 refills | Status: DC

## 2023-04-15 NOTE — Progress Notes (Signed)
Patient name: Derek Wells MRN: [id number] DOB: [birth year] Sex: male  REASON FOR VISIT: 6 month f/u left leg claudication (last seen 05/22/21)  HPI: Derek Wells is a 89 y.o. male with history of open AAA repair with aortobiiliac bypass by Dr. Hart [place] as well as coronary artery disease status post CABG that presents for 6 month follow-up of left lower extremity claudication.  Patiently previously seen with left leg claudication that limits him after half a mile.  He saw Dr. Allyson Sabal who then referred him back to our practice given he was previously under the care of Dr. Hart [place] in our practice.  He has no rest pain or open wounds.  We previously recommended conservative therapy given he was playing golf and walking up to half a mile.  Reports he still able to play 18 holes by riding a cart.  Still able to walk about a half a mile.  Feels that his legs get tired after a while.  Not smoking.  Past Medical History:  Diagnosis Date   CAD (coronary artery disease), severe needing CABG 01/19/2013   Chest pain    2D ECHO, 06/16/2012 - EF 50-55%, borderline low left ventricular systolic function   Coronary artery disease    Dyspnea on exertion    LEXISCAN, 06/17/2012 - fixed inferior bowel artifact and basal septal defect, probably related to IVCD/incomplete LBBB, no reversible ischemia, post-stress EF 66%   History of AAA (abdominal aortic aneurysm) repair    ABDOMINAL AORTIC DOPPLER, 06/16/2013 - normal   HTN (hypertension) 01/19/2013   Hyperlipidemia LDL goal < 70 01/19/2013   Hypertension    S/P CABG x 3 01/19/2013    Past Surgical History:  Procedure Laterality Date   CARDIAC CATHETERIZATION  01/15/2013   Severe coronary obstructive disease with 60-70% ostial left main and 90-85% distal LAD stenosis and mild-moderate stenoses in RCA, recommended for CABG   CARDIOVERSION N/A 11/12/2013   Procedure: CARDIOVERSION;  Surgeon: Lennette Bihari, MD;  Location: [facility] ENDOSCOPY;  Service: Cardiovascular;   Laterality: N/A;  11:16 Lido 60mg , Propofol 120mg ,IV 11:19 synch 100 joules conversion conversion to NSR....   CARDIOVERSION N/A 05/13/2017   Procedure: CARDIOVERSION;  Surgeon: Elease Hashimoto Deloris Ping, MD;  Location: [facility] ENDOSCOPY;  Service: Cardiovascular;  Laterality: N/A;   CORONARY ARTERY BYPASS GRAFT N/A 01/15/2013   Procedure: Coronary Artery Bypass Graft times three using Right Greater Saphenous Vein Graft harvested endoscopically and Left Internal Mammary Artery and;  Surgeon: Alleen Borne, MD;  Location: [facility] OR;  Service: Open Heart Surgery;  Laterality: N/A;   INTRAOPERATIVE TRANSESOPHAGEAL ECHOCARDIOGRAM  01/15/2013   Procedure: INTRAOPERATIVE TRANSESOPHAGEAL ECHOCARDIOGRAM;  Surgeon: Alleen Borne, MD;  Location: [facility] OR;  Service: Open Heart Surgery;;   LEFT HEART CATHETERIZATION WITH CORONARY ANGIOGRAM N/A 01/15/2013   Procedure: LEFT HEART CATHETERIZATION WITH CORONARY ANGIOGRAM;  Surgeon: Lennette Bihari, MD;  Location: [facility] CATH LAB;  Service: Cardiovascular;  Laterality: N/A;   VASCULAR SURGERY      Family History  Problem Relation Age of Onset   Hyperlipidemia Mother    Heart disease Mother    Heart disease Father    Hypertension Father    Heart disease Brother     SOCIAL HISTORY: Social History   Tobacco Use   Smoking status: Former    Types: Cigars    Quit date: 01/16/2013    Years since quitting: 10.2   Smokeless tobacco: Never  Substance Use Topics   Alcohol use: Yes    Alcohol/week:  14.0 standard drinks of alcohol    Types: 14 Standard drinks or equivalent per week    Allergies  Allergen Reactions   Penicillin G     Other reaction(s): rash   Penicillins Rash    Has patient had a PCN reaction causing immediate rash, facial/tongue/throat swelling, SOB or lightheadedness with hypotension: no Has patient had a PCN reaction causing severe rash involving mucus membranes or skin necrosis: no Has patient had a PCN reaction that required hospitalization: no Has patient had  a PCN reaction occurring within the last 10 years: yes If all of the above answers are "NO", then may proceed with Cephalosporin use.     Current Outpatient Medications  Medication Sig Dispense Refill   amLODipine (NORVASC) 5 MG tablet TAKE 1 TABLET(5 MG) BY MOUTH DAILY 90 tablet 3   Ascorbic Acid (VITAMIN C) 1000 MG tablet Take 1,000 mg by mouth daily. 1 Tablet Daily     atorvastatin (LIPITOR) 40 MG tablet TAKE 1 TABLET BY MOUTH EVERY DAY 90 tablet 2   cholecalciferol (VITAMIN D3) 25 MCG (1000 UNIT) tablet Take 1,000 Units by mouth daily. 1 Tablet Daily     COVID-19 mRNA bivalent vaccine, Pfizer, (PFIZER COVID-19 VAC BIVALENT) injection Inject into the muscle. 0.3 mL 0   COVID-19 mRNA vaccine 2023-2024 (COMIRNATY) syringe Inject into the muscle. 0.3 mL 0   ELIQUIS 5 MG TABS tablet TAKE 1 TABLET BY MOUTH TWICE DAILY 180 tablet 1   ezetimibe (ZETIA) 10 MG tablet TAKE 1 TABLET(10 MG) BY MOUTH DAILY 30 tablet 8   influenza vaccine adjuvanted (FLUAD QUADRIVALENT) 0.5 ML injection Inject into the muscle. 0.5 mL 0   irbesartan (AVAPRO) 150 MG tablet TAKE 1 TABLET BY MOUTH DAILY 90 tablet 3   latanoprost (XALATAN) 0.005 % ophthalmic solution 1 drop at bedtime.     metFORMIN (GLUCOPHAGE) 500 MG tablet Take 500 mg by mouth daily.     Multiple Vitamin (MULTIVITAMIN WITH MINERALS) TABS Take 1 tablet by mouth daily.     No current facility-administered medications for this visit.    REVIEW OF SYSTEMS:  [X]  denotes positive finding, [ ]  denotes negative finding Cardiac  Comments:  Chest pain or chest pressure:    Shortness of breath upon exertion:    Short of breath when lying flat:    Irregular heart rhythm:        Vascular    Pain in calf, thigh, or hip brought on by ambulation: x Left  Pain in feet at night that wakes you up from your sleep:     Blood clot in your veins:    Leg swelling:         Pulmonary    Oxygen at home:    Productive cough:     Wheezing:         Neurologic     Sudden weakness in arms or legs:     Sudden numbness in arms or legs:     Sudden onset of difficulty speaking or slurred speech:    Temporary loss of vision in one eye:     Problems with dizziness:         Gastrointestinal    Blood in stool:     Vomited blood:         Genitourinary    Burning when urinating:     Blood in urine:        Psychiatric    Major depression:         Hematologic  Bleeding problems:    Problems with blood clotting too easily:        Skin    Rashes or ulcers:        Constitutional    Fever or chills:      PHYSICAL EXAM: There were no vitals filed for this visit.   GENERAL: The patient is a well-nourished male, in no acute distress. The vital signs are documented above. CARDIAC: There is a regular rate and rhythm.  VASCULAR:  Palpable femoral pulses bilaterally Left PT palpable Right DP palpable No tissue loss lower extremities PULMONARY: No respiratory distress ABDOMEN: Soft and non-tender MUSCULOSKELETAL: There are no major deformities or cyanosis. NEUROLOGIC: No focal weakness or paresthesias are detected. SKIN: There are no ulcers or rashes noted. PSYCHIATRIC: The patient has a normal affect.  DATA:   ABI's today 0.85 right biphasic and 1.37 left biphasic  Bilateral lower extremity arterial duplex 04/11/2021 shows a high-grade proximal right SFA stenosis with a velocity of 411 consistent with 75 to 99% stenosis and on the left has a mid SFA high-grade stenosis with a velocity of 406 consistent with a 75 to 99% stenosis.    Assessment/Plan:  89 year old male presents for 6 month interval follow-up of left lower extremity claudication in the setting of previous aortobiiliac bypass for open repair of an abdominal aortic aneurysm by Dr. Hayden Pedro. Previously seen with left SFA stenosis in 2022.  He is still able to play 18 holes of golf by riding in a cart and walking about half a mile.  On exam he has a palpable PT pulse at the left ankle.   I have again discussed that I would not recommend any invasive intervention given I think he is actually doing well overall.  I think he would like to get improved mobility and I discussed Pletal for additional medical therapy.  I discussed 100 mg twice a day and taking this for 4 to 6 weeks to see any improvement.  I will see him back in 3 months to see how he is doing.  I again discussed walking therapies with him and his wife.  Again I do not think his claudication is disabling at this time and it would require brachial approach which is high risk.   Cephus Shelling, MD [facility] [place] Office: [phone]

## 2023-05-06 ENCOUNTER — Encounter (HOSPITAL_COMMUNITY): Payer: Medicare Other

## 2023-05-06 ENCOUNTER — Ambulatory Visit: Payer: Medicare Other | Admitting: Vascular Surgery

## 2023-05-12 ENCOUNTER — Ambulatory Visit: Payer: Medicare Other | Attending: Cardiovascular Disease | Admitting: Cardiovascular Disease

## 2023-05-12 VITALS — BP 132/60 | HR 50 | Ht 73.0 in | Wt 233.2 lb

## 2023-05-12 DIAGNOSIS — Z7901 Long term (current) use of anticoagulants: Secondary | ICD-10-CM

## 2023-05-12 DIAGNOSIS — I350 Nonrheumatic aortic (valve) stenosis: Secondary | ICD-10-CM | POA: Diagnosis not present

## 2023-05-12 DIAGNOSIS — Z951 Presence of aortocoronary bypass graft: Secondary | ICD-10-CM | POA: Diagnosis not present

## 2023-05-12 DIAGNOSIS — I2510 Atherosclerotic heart disease of native coronary artery without angina pectoris: Secondary | ICD-10-CM | POA: Diagnosis not present

## 2023-05-12 DIAGNOSIS — Z8679 Personal history of other diseases of the circulatory system: Secondary | ICD-10-CM

## 2023-05-12 DIAGNOSIS — E1159 Type 2 diabetes mellitus with other circulatory complications: Secondary | ICD-10-CM

## 2023-05-12 DIAGNOSIS — I10 Essential (primary) hypertension: Secondary | ICD-10-CM

## 2023-05-12 DIAGNOSIS — Z9889 Other specified postprocedural states: Secondary | ICD-10-CM

## 2023-05-12 DIAGNOSIS — Z7984 Long term (current) use of oral hypoglycemic drugs: Secondary | ICD-10-CM

## 2023-05-12 DIAGNOSIS — I739 Peripheral vascular disease, unspecified: Secondary | ICD-10-CM

## 2023-05-12 DIAGNOSIS — E785 Hyperlipidemia, unspecified: Secondary | ICD-10-CM

## 2023-05-12 DIAGNOSIS — I4892 Unspecified atrial flutter: Secondary | ICD-10-CM

## 2023-05-12 DIAGNOSIS — I4819 Other persistent atrial fibrillation: Secondary | ICD-10-CM

## 2023-05-12 NOTE — Patient Instructions (Signed)
Medication Instructions:  *If you need a refill on your cardiac medications before your next appointment, please call your pharmacy*   Lab Work: If you have labs (blood work) drawn today and your tests are completely normal, you will receive your results only by: MyChart Message (if you have MyChart) OR A paper copy in the mail If you have any lab test that is abnormal or we need to change your treatment, we will call you to review the results.   Testing/Procedures: Your physician has requested that you have an echocardiogram in February 2025. Echocardiography is a painless test that uses sound waves to create images of your heart. It provides your doctor with information about the size and shape of your heart and how well your heart's chambers and valves are working. This procedure takes approximately one hour. There are no restrictions for this procedure. Please do NOT wear cologne, perfume, aftershave, or lotions (deodorant is allowed). Please arrive 15 minutes prior to your appointment time.    Follow-Up: At [facility], you and your health needs are our priority.  As part of our continuing mission to provide you with exceptional heart care, we have created designated Provider Care Teams.  These Care Teams include your primary Cardiologist (physician) and Advanced Practice Providers (APPs -  Physician Assistants and Nurse Practitioners) who all work together to provide you with the care you need, when you need it.  We recommend signing up for the patient portal called "MyChart".  Sign up information is provided on this After Visit Summary.  MyChart is used to connect with patients for Virtual Visits (Telemedicine).  Patients are able to view lab/test results, encounter notes, upcoming appointments, etc.  Non-urgent messages can be sent to your provider as well.   To learn more about what you can do with MyChart, go to [email or web address].    Your next appointment:   7  month(s)  Provider:   Nicki Guadalajara, MD     Other Instructions: A letter will be mailed to you October 2024 as a reminder to call the office for your follow up appointment.

## 2023-05-12 NOTE — Progress Notes (Unsigned)
ID: [name], male   DOB: 1932/09/26, 89 y.o.   MRN: [id number]        HPI: Derek Wells is a 89 y.o. male who presents to the office today for a 6 month followup cardiology evaluation.   [name] has a history of hypertension, hyperlipidemia, and is s/p abdominal aortic aneurysm repair in April 2010. I had seen him on 01/14/2013 at which time he had experienced symptoms worrisome for unstable angina. Previously, in September 2013 he had undergone a nuclear perfusion study  which was essentially normal although did show mild diaphragmatic attenuation. He also been found to have a atherogenic dyslipidemia pattern with reference to his lipid panel. Catheterization the following day on 01/15/2013 revealed life-threatening coronary anatomy with 60-70% ostial left main stenosis, 90-95% distal left main stenosis, 95-99% ostial LAD stenosis, and mild/moderate stenoses in the RCA. That same day he was successfully operated by Dr. Rexanne Wells and underwent CABG x3 with a LIMA to the LAD, SVG to the OM, and SVG to the PDA. He had a unremarkable postoperative course and was discharged 4 days later.  He saw Derek Wells on 01/27/13 and was doing well. I saw him in followup on 03/02/2013 and he  participating  in cardiac rehabilitation. When I saw him in September 2014, he was in sinus rhythm and has and had resumed a good level of activity.   In January 2015, he was found to be in atrial flutter at cardiac rehabilitation. He was unaware of his heart rhythm being abnormal. He had developed a URI over the holidays prior to that and had just completed a steroid dose pack. Xarelto 20 mg was added to his medical regimen. He did undergo subsequent blood work on January 20 which showed normal renal function and normal electrolytes. Liver function studies were normal. Hemoglobin was 13.7 hematocrit 41.7. Platelets were normal. TSH was normal at 2.63. Magnesium was normal at 2.0.  He underwent successful cardioversion for his  atrial flutter on 11/12/2013 and he cardioverted to sinus rhythm at 100 J.  Since his cardioversion, he is unaware of any recurrent rhythm disturbance.   He is in the maintenance phase of cardiac rehabilitation.  When I  saw him in June 2015, he told me that he had noticed several episodes of a vague chest sensation particularly when he rolls his garbage can back up a hill at home.    On 03/18/2014, he underwent a nuclear perfusion study, which showed reduced exercise capacity with a workload of only 5.9.  He did experience mild shortness of breath.  There was no significant ST depression, but he had developed transient left bundle branch block.  The scan was interpreted at low as low risk.  Ejection fraction was 65% without wall motion abnormalities.  An echo Doppler study showed an EF of 60-65%.  There were no regional wall motion abnormalities.  He had mild biatrial enlargement.  Mr. Derek Wells currently is playing golf at least 2 times per week and occasionally 3.  He is in the maintenance phase of cardiac rehabilitation.  He does admit to some weight gain.  He denies any episodes of chest pain, particularly during the cardiac rehabilitation program.  He has noticed some mild shortness of breath with deep breathing and some vague pressure in his chest which is short-lived walking up hills when he walks fast with his wife.  He has noticed some mild shortness of breath walking up steep hills in the golf [place] without  chest pressure.  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 2016.  When I saw him in 2017 he was anemic and had significantly reduced iron saturation at 6%, and his serum ferritin was 7.  I referred him for GI evaluation and underwent both colonoscopy and endoscopy by Dr. Carman Ching and 4 polyps were removed from his colon.  He was not having any active bleeding.  He does note improved energy.  He had taken Niferex initially and transitioned to over-the-counter iron. He has  more energy.  He is continuing with cardiac rehabilitation.  He underwent a follow-up abdominal ultrasound on 06/09/2015 which showed a patent aortobifemoral bypass graft without focal dilatation or stenosis.   In the late summer of 2017 he was at the beach for several weeks and played golf heat without chest pain.  However with fast walking he noted chest pressure.  For the past month he has had a cough.  He has a ventral hernia.  When I saw him, his ECG showed progressive PR prolongation.  I reduced his Lopressor back to 25 mg daily and added amlodipine 5 mg blood pressure and his chest tightness.  I scheduled him for a nuclear perfusion study which was done on 07/23/2016.  Ejection fraction was 62%.  There were no ECG changes ischemia, but there was a rare PVC.  He had normal perfusion without scar or ischemia.  His follow-up laboratory showed significant improvement with his iron saturation, now at 27%.  Lipid studies revealed a total cholesterol 164, triglycerides 122, HDL 40, and LDL 100.    When I saw him in January 2018, I suggested the addition of Zetia to his atorvastatin.  In the past.  He did not tolerate 80 mg, atorvastatin, and also had myalgias on 40 mg.  I also discussed plaque regression studies with Repatha.  He developed recurrent atrial fibrillation and had multiple evaluations in the A. fib clinic commencing in July 2018.  Ultimately, on 05/13/2017, he underwent successful cardioversion with restoration of sinus rhythm.  He saw Rudi Coco in follow-up of his cardioversion and was maintaining sinus rhythm.  There was discussion concerning his EtOH use and reduction in consumption.  He typically has at least 1 or more vodka every day.    When I lsaw him in February 2019 he was continuing to feel well and denied any recurrent episodes of atrial fibrillation.  His last echo Doppler study in October 2018 showed normal LV systolic and diastolic function.  He had biatrial enlargement with  mild aortic valve stenosis, and mean gradient of 14 and peak gradient of 25.  On follow-up abdominal aortic Doppler study the largest aortic measurement was 2.7 cm.   He was playing golf several days per week and was continue to participate in cardiac rehab.  On the evening of May 12, 2018 he was going up steps to bed developed an episode of full sensation in his throat chest with some radiate this was associated with palpitations.  Symptoms lasted approximately 10 minutes.  He was seen in the following morning relation clinic and was found to be in atrial flutter with a controlled ventricular rate.  He was scheduled to undergo DC cardioversion but upon arrival for his cardioversion procedure his ECG showed that he had converted back to sinus rhythm and had PACs.  I last saw on May 29, 2018.  He admitted to occasional lightheadedness and sensation of head fog.  He denied any exertional  chest tightness.  He was continuing to drink occasional vodka but admits that his drinking is less.  He was sleeping well.  He denied any significant snoring or daytime sleepiness.  Gwyndolyn Kaufman revealed normal thyroid function studies.  History was normal although glucose was minimally increased at 103.  CBC was normal.  LDL cholesterol was 71 but triglycerides are elevated at 173 total cholesterol 143 and HDL 37.  Because of increased cardiac murmur on auscultation he underwent a follow-up echo Doppler study in June 25, 2018.  This continued to show excellent ejection fraction at 60 to 65% with grade 2 diastolic dysfunction.  There was mild aortic valve stenosis with a mean gradient of 14, peak instantaneous gradient of 29, and calculated valve area of 1.6 8 cm.  When I saw him last on July 07, 2018 he was maintaining sinus rhythm and was without chest pain PND orthopnea although he still noted mild shortness of breath when bending over.  He was playing golf several days per week.    Since I last saw him in October  2019  he apparently has had recurrent issues with atrial fibrillation.  He has been seen by [name], PA, Azalee Course, [place],  Rudi Coco, NP and Edd Fabian, NP.  Apparently he has been in longstanding persistent atrial fibrillation for well over a year.  He has been on chronic anticoagulation with Eliquis.  When I saw him in April 2021 after not having seen him in 18 months he denied any recurrent anginal symptomatology.  He was continuing to play golf 2 days/week and had experienced some shortness of breath with uphill walking.  He was walking his dog daily.  Since he had not had an echo in over 2 years I recommended a follow-up echo Doppler study which was done on 02/14/2020.  This revealed an EF of 60 to 65%.  He had mildly increased pulmonary artery systolic pressure 36.5 mm.  There was evidence for mild aortic valve stenosis with a mean gradient of 13.8 and peak gradient of 22.8.  Pulmonary artery systolic pressure was mildly increased at 36.5.  He had normal atrial dimensions.    I saw him in October 2021.  Unfortunately, recently his wife broke her hip and required hip surgery and rehab therapy.  He has persistent atrial fibrillation and has been on Eliquis 5 mg twice a day.  At that time, his ECG showed atrial fibrillation at 54 bpm.  He continued to be on amlodipine 2.5 mg, irbesartan 150 mg and furosemide 20 mg daily.  He is on atorvastatin 40 mg for hyperlipidemia.  He is on Metformin 500 mg daily.    He underwent an echo Doppler study on January 08, 2021 which showed an EF of 55 to 60% without wall motion abnormalities.  There was mild biatrial enlargement.  There was mild aortic stenosis with a mean gradient of 14 mm and estimated AVA at 1.65 cm.  Abdominal aorta Doppler study on January 05, 2021 showed moderate atherosclerosis throughout the iliac arteries.  The exam was limited due to overlying bowel.  There was no evidence for obvious recurrent aneurysm.  He called our office in May 2022 with  reports of bradycardia with rates in the 40s as well as fatigability.  He was evaluated by Marjie Skiff on Feb 01, 2021.  He reported heart rates in the 40s with significant fatigability.  He subsequently wore a Zio patch monitor from May 9 through May 23.  He was in  atrial fibrillation throughout the monitor with rates ranging from the lowest at 32 on May 16 at 2:21 AM and the highest at 68 at 12:45 PM on May 10 with an average rate of 50 bpm.  He had bundle branch block/IVCD D.  There are occasional isolated PVCs without couplets or triplets.  I saw him on March 19, 2021 he continued to have periods of fatigability.    Typically his heart rate is in the low to mid 40s.  However on June 18 at 10:43 AM he reported his heart rate was around 38 bpm.  He also has noted some left calf claudication symptomatology.  He admits to decreased energy.  He still playing golf 2 days/week.  He denies recurrent chest pain.  During that evaluation, his blood pressure was stable on amlodipine 5 mg, irbesartan 150 mg and he was taking furosemide 20 mg daily.  There were no signs of volume overload and I suggested he try decreasing furosemide to every other day or as needed.  With his progressive left calf claudication symptomatology I recommended a lower extremity arterial Doppler study.  He underwent his Doppler study which raise concern for progressive PVD and he was referred to Dr. Allyson Sabal.  He subsequently was evaluated by Dr. Sherald Hess of [facility] since Dr. Hart Rochester had previously done his open AAA repair with aorto by iliac bypass.  Initially, a conservative approach was recommended with plans for follow-up in approximately 6 months.  If intervention is to be performed a brachial approach in the left arm to treat his left SFA high-grade stenosis would be necessary.  I saw Mr. Flaws on September 27, 2021 at which time he felt well and denied any chest pain.   He continues to be in longstanding persistent atrial fibrillation.   He denies presyncope or syncope.  His wife had recently tested positive for COVID before Christmas and he tested positive as well and received treatment with Paxlovid and tested -2 days prior to this evaluation.  He was having claudication issues and remotely had undergone open AAA repair with aorto by iliac bypass surgery by Dr. Hart Rochester and had developed left calf claudication with Doppler imaging on April 11, 2021 demonstrating high-grade proximal right SFA stenosis with velocity of 411 consistent with 75 to 99% stenosis and on the left and mid SFA high-grade stenosis with velocity of 406 also consistent with 75 to 99% stenosis.  He was evaluated by Dr. Chestine Spore.  During his evaluation I discussed further aggressive lipid-lowering therapy and recommended a retrial of adding Zetia 10 mg to his atorvastatin 40 mg.  Remotely he could not tolerate atorvastatin 80 mg.  He underwent a follow-up echo Doppler study in February 2023 which showed LV function with EF 60 to 65%, mild LVH.  There was severe left atrial dilatation with moderate right atrial dilatation.  There was moderate aortic valve stenosis with mild MR care.  Aortic peak gradient was 35 mm with mean gradient of 20 and estimated valve area 1.7 cm.  There was mild dilation of his ascending aorta.  When I saw Mr. Copus on Jan 29, 2022  he denied any recurrent chest pain.  He had noted some mild occasional shortness of breath episodes.   He continues to play golf approximately 2 days/week.  He uses a golf cart with a flag to allow him to take the cart to his ball where it is located.  He is planning to see Dr. Chestine Spore for follow-up vascular surgery evaluation  in light of his left leg greater than right claudication symptoms.  He admits to some shortness of breath particularly when he leans over.  He was supposed to have had an abdominal aortic aneurysm follow-up Doppler study which had not yet been done.  He has not had recent laboratory.  He is and he continues to  be in longstanding persistent atrial fibrillation and is on Eliquis anticoagulation.  Currently for the past week he ran out of his amlodipine which had been 5 mg daily and he continues to take furosemide 20 mg in addition to irbesartan 150 mg daily for hypertension.  He is on Zetia 10 mg in addition to atorvastatin 40 mg for hyperlipidemia.  During that evaluation, I recommended he resume amlodipine with target blood pressure less than 130/80.  He was to undergo follow-up abdominal aortic aneurysm duplex imaging.  I last saw him on June 24, 2022.  He continues to feel well.   In July and August, 2023 he had a longstanding bronchial infection and required antibiotic therapy for approximately 5 weeks.  He denies any chest pain.  At times he notes some balance issues.  He experiences occasional left lower extremity claudication symptomatology.  He will be following up with Dr. Chestine Spore.  He sees Dr. Kevan Ny for primary care.  He is no longer taking furosemide.  He continues to be in permanent atrial fibrillation with a controlled ventricular rate on Eliquis anticoagulation.  Since I last saw him, he underwent an echo Doppler study yesterday on November 04, 2022.  EF was normal at 55 to 60% without wall motion abnormalities.  Pulmonary artery systolic pressure was moderately elevated at 52.9 mmHg.  There was mild biatrial enlargement.  He had severe calcification of the aortic valve with mild aortic valve stenosis.  Mean gradient was 19 mmHg, peak gradient 31 mmHg with estimated valve area 1.63.  There is mild dilation of his ascending aorta at 38 mm.  Presently, Mr. Manson Passey feels well.  He denies chest pain or shortness of breath.  He denies presyncope or syncope or palpitations.  His atrial fibrillation rate has been controlled.  He admits to some trace swelling.  He continues to play golf regularly.  He has PVD and is followed by Dr. Chestine Spore.  He presents for follow-up evaluation.   Past Medical History:   Diagnosis Date   CAD (coronary artery disease), severe needing CABG 01/19/2013   Chest pain    2D ECHO, 06/16/2012 - EF 50-55%, borderline low left ventricular systolic function   Coronary artery disease    Dyspnea on exertion    LEXISCAN, 06/17/2012 - fixed inferior bowel artifact and basal septal defect, probably related to IVCD/incomplete LBBB, no reversible ischemia, post-stress EF 66%   History of AAA (abdominal aortic aneurysm) repair    ABDOMINAL AORTIC DOPPLER, 06/16/2013 - normal   HTN (hypertension) 01/19/2013   Hyperlipidemia LDL goal < 70 01/19/2013   Hypertension    S/P CABG x 3 01/19/2013    Past Surgical History:  Procedure Laterality Date   CARDIAC CATHETERIZATION  01/15/2013   Severe coronary obstructive disease with 60-70% ostial left main and 90-85% distal LAD stenosis and mild-moderate stenoses in RCA, recommended for CABG   CARDIOVERSION N/A 11/12/2013   Procedure: CARDIOVERSION;  Surgeon: Lennette Bihari, MD;  Location: [facility] ENDOSCOPY;  Service: Cardiovascular;  Laterality: N/A;  11:16 Lido 60mg , Propofol 120mg ,IV 11:19 synch 100 joules conversion conversion to NSR....   CARDIOVERSION N/A 05/13/2017   Procedure: CARDIOVERSION;  Surgeon: Elease Hashimoto Deloris Ping, MD;  Location: [facility] ENDOSCOPY;  Service: Cardiovascular;  Laterality: N/A;   CORONARY ARTERY BYPASS GRAFT N/A 01/15/2013   Procedure: Coronary Artery Bypass Graft times three using Right Greater Saphenous Vein Graft harvested endoscopically and Left Internal Mammary Artery and;  Surgeon: Alleen Borne, MD;  Location: [facility] OR;  Service: Open Heart Surgery;  Laterality: N/A;   INTRAOPERATIVE TRANSESOPHAGEAL ECHOCARDIOGRAM  01/15/2013   Procedure: INTRAOPERATIVE TRANSESOPHAGEAL ECHOCARDIOGRAM;  Surgeon: Alleen Borne, MD;  Location: [facility] OR;  Service: Open Heart Surgery;;   LEFT HEART CATHETERIZATION WITH CORONARY ANGIOGRAM N/A 01/15/2013   Procedure: LEFT HEART CATHETERIZATION WITH CORONARY ANGIOGRAM;  Surgeon: Lennette Bihari, MD;   Location: [facility] CATH LAB;  Service: Cardiovascular;  Laterality: N/A;   VASCULAR SURGERY      Allergies  Allergen Reactions   Penicillin G     Other reaction(s): rash   Penicillins Rash    Has patient had a PCN reaction causing immediate rash, facial/tongue/throat swelling, SOB or lightheadedness with hypotension: no Has patient had a PCN reaction causing severe rash involving mucus membranes or skin necrosis: no Has patient had a PCN reaction that required hospitalization: no Has patient had a PCN reaction occurring within the last 10 years: yes If all of the above answers are "NO", then may proceed with Cephalosporin use.     Current Outpatient Medications  Medication Sig Dispense Refill   amLODipine (NORVASC) 5 MG tablet TAKE 1 TABLET(5 MG) BY MOUTH DAILY 90 tablet 3   Ascorbic Acid (VITAMIN C) 1000 MG tablet Take 1,000 mg by mouth daily. 1 Tablet Daily     atorvastatin (LIPITOR) 40 MG tablet TAKE 1 TABLET BY MOUTH EVERY DAY 90 tablet 2   cholecalciferol (VITAMIN D3) 25 MCG (1000 UNIT) tablet Take 1,000 Units by mouth daily. 1 Tablet Daily     cilostazol (PLETAL) 100 MG tablet Take 1 tablet (100 mg total) by mouth 2 (two) times daily before a meal. 60 tablet 11   COVID-19 mRNA bivalent vaccine, Pfizer, (PFIZER COVID-19 VAC BIVALENT) injection Inject into the muscle. 0.3 mL 0   COVID-19 mRNA vaccine 2023-2024 (COMIRNATY) syringe Inject into the muscle. 0.3 mL 0   ELIQUIS 5 MG TABS tablet TAKE 1 TABLET BY MOUTH TWICE DAILY 180 tablet 1   ezetimibe (ZETIA) 10 MG tablet TAKE 1 TABLET(10 MG) BY MOUTH DAILY 30 tablet 8   influenza vaccine adjuvanted (FLUAD QUADRIVALENT) 0.5 ML injection Inject into the muscle. 0.5 mL 0   irbesartan (AVAPRO) 150 MG tablet TAKE 1 TABLET BY MOUTH DAILY 90 tablet 3   latanoprost (XALATAN) 0.005 % ophthalmic solution 1 drop at bedtime.     metFORMIN (GLUCOPHAGE) 500 MG tablet Take 500 mg by mouth daily.     Multiple Vitamin (MULTIVITAMIN WITH MINERALS) TABS Take  1 tablet by mouth daily. (Patient not taking: Reported on 05/12/2023)     No current facility-administered medications for this visit.    Socially he is re-married. His first wife of 42 years passed away. He previously had smoked cigars prior to his bypass surgery but has not had any tobacco use since.  He continues to play golf at least 2 times per week.  He does a lot of leg exercise with cardiac rehabilitation.  ROS General: Negative; No fevers, chills, or night sweats; fatigability HEENT: Negative; No changes in vision or hearing, sinus congestion, difficulty swallowing Pulmonary: URI symptoms during the winter; recent prolonged antibiotics for bronchial infection Cardiovascular: Mild shortness of breath  with uphill walking; see history of present illness;  left calf claudication with high-grade proximal right SFA stenosis and left mid SFA stenoses. GI: Negative; No nausea, vomiting, diarrhea, or abdominal pain GU: Negative; No dysuria, hematuria, or difficulty voiding Musculoskeletal: Negative; no myalgias, joint pain, or weakness Hematologic/Oncology: Negative; no easy bruising, bleeding Endocrine: Negative; no heat/cold intolerance; no diabetes Neuro: Negative; no changes in balance, headaches Skin: Removal of small left forearm melanoma. Psychiatric: Negative; No behavioral problems, depression Sleep: Negative; No snoring, daytime sleepiness, hypersomnolence, bruxism, restless legs, hypnogognic hallucinations, no cataplexy Other comprehensive 14 point system review is negative.   PE BP 132/60 (BP Location: Left Arm, Patient Position: Sitting, Cuff Size: Normal)   Pulse (!) 50   Ht 6\' 1"  (1.854 m)   Wt 233 lb 3.2 oz (105.8 kg)   SpO2 95%   BMI 30.77 kg/m    Repeat blood pressure by me was 138/74  Wt Readings from Last 3 Encounters:  05/12/23 233 lb 3.2 oz (105.8 kg)  04/15/23 235 lb (106.6 kg)  11/05/22 239 lb (108.4 kg)     Physical Exam BP 132/60 (BP Location: Left  Arm, Patient Position: Sitting, Cuff Size: Normal)   Pulse (!) 50   Ht 6\' 1"  (1.854 m)   Wt 233 lb 3.2 oz (105.8 kg)   SpO2 95%   BMI 30.77 kg/m  General: Alert, oriented, no distress.  Skin: normal turgor, no rashes, warm and dry HEENT: Normocephalic, atraumatic. Pupils equal round and reactive to light; sclera anicteric; extraocular muscles intact; Fundi ** Nose without nasal septal hypertrophy Mouth/Parynx benign; Mallinpatti scale Neck: No JVD, no carotid bruits; normal carotid upstroke Lungs: clear to ausculatation and percussion; no wheezing or rales Chest wall: without tenderness to palpitation Heart: PMI not displaced, RRR, s1 s2 normal, 1/6 systolic murmur, no diastolic murmur, no rubs, gallops, thrills, or heaves Abdomen: soft, nontender; no hepatosplenomehaly, BS+; abdominal aorta nontender and not dilated by palpation. Back: no CVA tenderness Pulses 2+ Musculoskeletal: full range of motion, normal strength, no joint deformities Extremities: no clubbing cyanosis or edema, Homan's sign negative  Neurologic: grossly nonfocal; Cranial nerves grossly wnl Psychologic: Normal mood and affect    General: Alert, oriented, no distress.  Skin: normal turgor, no rashes, warm and dry HEENT: Normocephalic, atraumatic. Pupils equal round and reactive to light; sclera anicteric; extraocular muscles intact; Fundi ** Nose without nasal septal hypertrophy Mouth/Parynx benign; Mallinpatti scale 3 Neck: No JVD, no carotid bruits; normal carotid upstroke Lungs: clear to ausculatation and percussion; no wheezing or rales Chest wall: without tenderness to palpitation Heart: PMI not displaced, RRR, s1 s2 normal, 2/6 systolic murmur in aortic area, no diastolic murmur, no rubs, gallops, thrills, or heaves Abdomen: soft, nontender; no hepatosplenomehaly, BS+; abdominal aorta nontender and not dilated by palpation. Back: no CVA tenderness Pulses 2+; slightly decreased 1+ left dorsalis  pedis Musculoskeletal: full range of motion, normal strength, no joint deformities Extremities: Trivial right ankle edema no clubbing cyanosis, Homan's sign negative  Neurologic: grossly nonfocal; Cranial nerves grossly wnl Psychologic: Normal mood and affect       November 05, 2022 ECG (independently read by me): Atrial fibrillation at 56, LBBB   June 24, 2022 ECG (independently read by me): Atrial fibrillation at 52, Left axis deviation  Jan 29, 2022 ECG (independently read by me): Atrial fibrillation at 57; Right axis deviation, Nonspecific IVCD   September 27, 2021 ECG (independently read by me):  Atrial fibrillation at 53, LBBB  March 19, 2021 ECG (  independently read by me): Atrial fibrillation at 47; LAD, QTc 430 msec  October 2021 ECG (independently read by me): Atrial fibrillation at 54; PRWP; QTc 428 bmsec  April 2021 ECG (independently read by me): Atrial fibrillation with an average ventricular rate of 53 bpm with a range of in the 60s to 40s.  One isolated PVC.  Nonspecific interventricular block.  July 07, 2020 ECG (independently read by me): Sinus bradycardia with first-degree AV block.  PR interval 392 ms.  Incomplete left bundle branch block.  May 29, 2018 ECG (independently read by me): Sinus rhythm with first-degree AV block at 72 bpm with a PR interval of 312 ms.  Occasional PACs.  February 2019 ECG (independently read by me): Sinus rhythm at 63 bpm.  First-degree AV block with a PR interval at 344 ms.  PAC.  Nonspecific interventricular conduction delay.  October 2018 ECG (independently read by me): Sinus rhythm with first-degree AV block with a PR interval of 314 ms.  Nonspecific interventricular block.  QTc interval 431  January 2018 ECG (independently read by me): Normal sinus rhythm with first-degree AV block.  PR interval 300 ms.  Nonspecific interventricular conduction delay.  October 2017 ECG (independently read by me): Normal sinus rhythm at 66 bpm.   First-degree block with a PR interval at 290 ms.  Nonspecific interventricular block.  03/02/2015 ECG (independently read by me): Sinus rhythm at 75 bpm.  Occasional unifocal PVCs.  Nonspecific interventricular block.  Prior October 2015 ECG (independently read by me): Normal sinus rhythm at 67 beats per minute.  First degree A-V block with PR interval at 290 ms.  QTc interval 439 ms.  No ST segment changes.  Prior June 2015 ECG (independently read by me): Normal sinus rhythm at 68 beats per minute with first degree AV block with a PR interval at 284 ms.  QTc interval 435 ms.  No significant ST-T changes.  T-wave inversion in aVL  ECG today (independently read by me):  Normal sinus rhythm at 71 beats per minute. First create a block with PR interval at 240 ms. QTc interval normal at 4-5 ms.  Prior 10/27/13 ECG (independently read by me): Atrial flutter with variable rate but predominantly 4-1 with occasional 3-1 block; average heart rate 70 beats per minute  Prior ECG from 06/04/2013: Sinus rhythm with first-degree AV block. Nonspecific interventricular block  LABS:    Latest Ref Rng & Units 06/19/2022   10:17 AM 11/08/2021    9:53 AM 02/01/2021    3:47 PM  BMP  Glucose 70 - 99 mg/dL 97  93  95   BUN 8 - 27 mg/dL 16  14  20    Creatinine 0.76 - 1.27 mg/dL 7.84  6.96  2.95   BUN/Creat Ratio 10 - 24 18  15  19    Sodium 134 - 144 mmol/L 141  140  140   Potassium 3.5 - 5.2 mmol/L 4.6  4.9  4.7   Chloride 96 - 106 mmol/L 104  103  103   CO2 20 - 29 mmol/L 22  23  24    Calcium 8.6 - 10.2 mg/dL 9.5  9.4  9.9       Latest Ref Rng & Units 06/19/2022   10:17 AM 11/08/2021    9:53 AM 02/01/2021    3:47 PM  Hepatic Function  Total Protein 6.0 - 8.5 g/dL 7.1  6.8  7.1   Albumin 3.7 - 4.7 g/dL 4.6  4.4  4.4   AST  0 - 40 IU/L 19  18  15    ALT 0 - 44 IU/L 17  18  18    Alk Phosphatase 44 - 121 IU/L 99  77  83   Total Bilirubin 0.0 - 1.2 mg/dL 1.2  1.1  0.9       Latest Ref Rng & Units 11/08/2021     9:53 AM 03/19/2021   12:00 PM 01/10/2020   10:35 AM  CBC  WBC 3.4 - 10.8 x10E3/uL 9.1  9.0  7.4   Hemoglobin 13.0 - 17.7 g/dL 16.1  09.6  04.5   Hematocrit 37.5 - 51.0 % 39.9  40.7  41.6   Platelets 150 - 450 x10E3/uL 248  259  228    Lab Results  Component Value Date   MCV 81 11/08/2021   MCV 83 03/19/2021   MCV 87 01/10/2020   Lab Results  Component Value Date   TSH 1.970 11/08/2021   Lipid Panel     Component Value Date/Time   CHOL 113 06/19/2022 1017   CHOL 111 03/01/2015 1033   TRIG 74 06/19/2022 1017   TRIG 90 03/01/2015 1033   HDL 39 (L) 06/19/2022 1017   HDL 41 03/01/2015 1033   CHOLHDL 2.9 06/19/2022 1017   CHOLHDL 4.1 07/19/2016 0949   VLDL 24 07/19/2016 0949   LDLCALC 59 06/19/2022 1017   LDLCALC 52 03/01/2015 1033     RADIOLOGY:   ECHO: 11/04/2022   1. Left ventricular ejection fraction, by estimation, is 55 to 60%. The  left ventricle has normal function. The left ventricle has no regional  wall motion abnormalities. Left ventricular diastolic parameters are  indeterminate.   2. Right ventricular systolic function is mildly reduced. The right  ventricular size is moderately enlarged. There is moderately elevated  pulmonary artery systolic pressure. The estimated right ventricular  systolic pressure is 52.9 mmHg.   3. Left atrial size was mildly dilated.   4. Right atrial size was mildly dilated.   5. The mitral valve is normal in structure. Trivial mitral valve  regurgitation. No evidence of mitral stenosis. Moderate mitral annular  calcification.   6. The aortic valve is tricuspid. There is severe calcifcation of the  aortic valve. Aortic valve regurgitation is trivial. Mild aortic valve  stenosis. Aortic valve area, by VTI measures 1.63 cm. Aortic valve mean  gradient measures 19.0 mmHg.   7. Aortic dilatation noted. There is mild dilatation of the ascending  aorta, measuring 38 mm.   8. The inferior vena cava is dilated in size with >50%  respiratory  variability, suggesting right atrial pressure of 8 mmHg.   9. The patient was in atrial fibrillation.    ECHO: 11/04/2022  1. Left ventricular ejection fraction, by estimation, is 55 to 60%. The  left ventricle has normal function. The left ventricle has no regional  wall motion abnormalities. Left ventricular diastolic parameters are  indeterminate.   2. Right ventricular systolic function is mildly reduced. The right  ventricular size is moderately enlarged. There is moderately elevated  pulmonary artery systolic pressure. The estimated right ventricular  systolic pressure is 52.9 mmHg.   3. Left atrial size was mildly dilated.   4. Right atrial size was mildly dilated.   5. The mitral valve is normal in structure. Trivial mitral valve  regurgitation. No evidence of mitral stenosis. Moderate mitral annular  calcification.   6. The aortic valve is tricuspid. There is severe calcifcation of the  aortic valve. Aortic valve regurgitation  is trivial. Mild aortic valve  stenosis. Aortic valve area, by VTI measures 1.63 cm. Aortic valve mean  gradient measures 19.0 mmHg.   7. Aortic dilatation noted. There is mild dilatation of the ascending  aorta, measuring 38 mm.   8. The inferior vena cava is dilated in size with >50% respiratory  variability, suggesting right atrial pressure of 8 mmHg.   9. The patient was in atrial fibrillation.    IMPRESSION:  1. Primary hypertension   2. Atrial flutter, unspecified type (HCC)   3. Persistent atrial fibrillation     ASSESSMENT AND PLAN: Mr. Derek Wells is an active 89 year old white male who underwent emergent CABG revascularization surgery after cardiac catheterization on 01/15/2013 demonstrated severe life threatening coronary anatomy.  He  developed atrial flutter in January 2015 and was started on anticoagulation with Xarelto and underwent cardioversion.  He developed recurrent episodes of atrial fibrillation in 2018 and had  maintained sinus rhythm since his repeat cardioversion on 05/13/2017 but developed recurrent atrial flutter 1 year later in May 13, 2018 was found to have a flutter with controlled rate.  When seen in October 2019 he was maintaining sinus rhythm.  However, subsequently he has developed longstanding persistent atrial fibrillation and has been maintained on Eliquis for anticoagulation.  An echo Doppler study in 2019 showed EF 60 to 65% with mean aortic gradient 14 consistent with mild aortic stenosis and grade 2 diastolic dysfunction.  In May 2020 1 aortic stenosis remains mild with mean gradient of 13.8 and peak gradient of 22.8.  There was mild pulmonary hypertension with PA systolic pressure of 36.5.  His echo in April 2022 showed a mean gradient of 14.  His echo from February 2022  showed a mean gradient of 20 and peak aortic gradient of 35 mm.  There was severe left atrial dilatation and moderate right atrial dilatation.  His echo in February 2023 showed EF 60 to 65% with moderate aortic stenosis, mean gradient 20 peak gradient 35 mmHg.  His most recent echo was done yesterday, November 04, 2022 which showed an EF of 55 to 60%.  There was moderately increased PA pressure at 52.9 mmHg.  Aortic stenosis remained mild with a mean gradient of 19 and peak gradient of 31 mmHg with estimated valve area at 1.63 cm. His most recent abdominal aortic ultrasound was stable with abdominal aorta at 2.6 cm with no thrombus in the IVC.  He continues to have mild issues with left leg claudication and has seen Dr. Chestine Spore in the [facility].  He continues to have permanent A-fib with controlled rate and has been on chronic Eliquis anticoagulation.  His blood pressure today on repeat by me was 138/74 on amlodipine 5 mg, irbesartan 150 mg daily.  He is on atorvastatin 40 mg and Zetia 10 mg for hyperlipidemia.  Lipid panel in September 2023 showed total cholesterol 113, LDL cholesterol 59, triglycerides 74 and HDL  39.  He is diabetic on metformin 500 mg daily.  He will continue current therapy with follow-up with Dr. Chestine Spore for PVD.  I will see him in 6 months for reevaluation or sooner as needed.     Lennette Bihari, MD, [facility]  05/12/2023 3:27 PM

## 2023-05-13 ENCOUNTER — Encounter: Payer: Self-pay | Admitting: Cardiovascular Disease

## 2023-06-18 DIAGNOSIS — L570 Actinic keratosis: Secondary | ICD-10-CM | POA: Diagnosis not present

## 2023-06-18 DIAGNOSIS — Z08 Encounter for follow-up examination after completed treatment for malignant neoplasm: Secondary | ICD-10-CM | POA: Diagnosis not present

## 2023-06-18 DIAGNOSIS — L821 Other seborrheic keratosis: Secondary | ICD-10-CM | POA: Diagnosis not present

## 2023-06-18 DIAGNOSIS — L814 Other melanin hyperpigmentation: Secondary | ICD-10-CM | POA: Diagnosis not present

## 2023-06-18 DIAGNOSIS — D225 Melanocytic nevi of trunk: Secondary | ICD-10-CM | POA: Diagnosis not present

## 2023-07-15 ENCOUNTER — Ambulatory Visit: Payer: Medicare Other | Admitting: Vascular Surgery

## 2023-07-16 ENCOUNTER — Other Ambulatory Visit (HOSPITAL_BASED_OUTPATIENT_CLINIC_OR_DEPARTMENT_OTHER): Payer: Self-pay

## 2023-07-16 MED ORDER — FLUAD 0.5 ML IM SUSY
0.5000 mL | PREFILLED_SYRINGE | Freq: Once | INTRAMUSCULAR | 0 refills | Status: AC
  Filled 2023-07-16: qty 0.5, 1d supply, fill #0

## 2023-07-23 ENCOUNTER — Telehealth: Payer: Self-pay | Admitting: Cardiovascular Disease

## 2023-07-23 NOTE — Telephone Encounter (Signed)
Patient said he was playing golf yesterday and experienced weakness in his legs. BP 150/58 HR 69. Usually HR in 40-50. Patient states it was better when he got home and rested. No other symptoms reported by patient at this time.  Pt concerned dose of medication may need to be adjusted. Recommended patient inform Dr. Chestine Spore (prescribing provider) of symptoms as well.

## 2023-07-23 NOTE — Telephone Encounter (Signed)
Pt c/o medication issue:  1. Name of Medication: cilostazol (PLETAL) 100 MG tablet   2. How are you currently taking this medication (dosage and times per day)?   Take 1 tablet (100 mg total) by mouth 2 (two) times daily before a meal.    3. Are you having a reaction (difficulty breathing--STAT)? No  4. What is your medication issue? Pt states the above medication could be affecting his heart. Please advise

## 2023-07-24 ENCOUNTER — Telehealth: Payer: Self-pay

## 2023-07-24 NOTE — Telephone Encounter (Signed)
Patient called and left a voice message on the triage line stating that he had started taking Cilostazol and had felt extremely tired and his HR had gone up to 80 at one point.  After reviewing patient's chart, I returned patient's call. I spoke with patient with two identifier verification. Patient stated he was playing golf on Tuesday and he felt a little tired/fatigued. He stated that when he got home he took his BP and it was 135/58, his HR started out at 80 but went down to 60. He stated that he usually runs into the 50's. Patient stated that he started taking the Cilostazol about 3 weeks ago and he was wondering if it could be from that. Patient denied having any headaches, palpitations or any other symptoms other than the fatigue. Patient does have a history of AFIB. After further discussion, it was noted that patient also had received his flu shot on Monday evening.  After discussion with RN in office, it is felt that patient should continue taking the Cilostazol as prescribed, that it is not felt to be the cause of the fatigue feeling as patient is feeling better now and had no further issues afterwards nor had any before that. It is felt that the flu shot could have been a contributing factor to the symptoms he was having. I told patient to continue to monitor his vital signs and if he should have any further issues or questions, to please reach back out to [place]. Patient in agreement with this plan.

## 2023-07-28 DIAGNOSIS — I4892 Unspecified atrial flutter: Secondary | ICD-10-CM | POA: Diagnosis not present

## 2023-07-28 DIAGNOSIS — I10 Essential (primary) hypertension: Secondary | ICD-10-CM | POA: Diagnosis not present

## 2023-07-28 DIAGNOSIS — D6869 Other thrombophilia: Secondary | ICD-10-CM | POA: Diagnosis not present

## 2023-07-28 DIAGNOSIS — Z03818 Encounter for observation for suspected exposure to other biological agents ruled out: Secondary | ICD-10-CM | POA: Diagnosis not present

## 2023-07-28 DIAGNOSIS — E119 Type 2 diabetes mellitus without complications: Secondary | ICD-10-CM | POA: Diagnosis not present

## 2023-07-28 DIAGNOSIS — R5383 Other fatigue: Secondary | ICD-10-CM | POA: Diagnosis not present

## 2023-07-28 DIAGNOSIS — E1151 Type 2 diabetes mellitus with diabetic peripheral angiopathy without gangrene: Secondary | ICD-10-CM | POA: Diagnosis not present

## 2023-07-29 ENCOUNTER — Telehealth: Payer: Self-pay | Admitting: Cardiovascular Disease

## 2023-07-29 NOTE — Telephone Encounter (Signed)
Called and spoke to patient  Patient states:   -Dr. Chestine Spore Vascular surgeon prescribed Cilostazol (Pletal)   -started medications 2 weeks ago   -since starting Rx has not felt well   -Has history pf A.fib, typically his HR is ~50BPM   -Last Tuesday he felt very fatigued during his usual golfing & couldn't finish   -Hr increased to 70-80BPM   -Felt like heart was racing, and wiped out   -he has had these symptoms on and off   -yesterday he felt good, HR was ~56 BPM   -Today he feels weak and wiped out   -HR today was in 60s   -Checks his BP daily, NP today 125/66  -contact Dr. Burna Forts office, was informed Rx does not affect heart rate   -does not plan on taking medication again  Patient denies:   -Heart palpitation (skipping a beat, racing)   -new SOB/difficulty breathing   -chest pain   -chest tightness  Informed patient message sent for input/advisement  Reviewed strict ED warning signs/precautions  Patient agreeable, no further question at this time    FWD to Dr. Tresa Endo for additional input/advisement  FWD to Pharm D, is this medication Cilostazol contraindicated?

## 2023-07-29 NOTE — Telephone Encounter (Signed)
Patient c/o Palpitations:  STAT if patient reporting lightheadedness, shortness of breath, or chest pain  How long have you had palpitations/irregular HR/ Afib? Are you having the symptoms now?   Slightly  Are you currently experiencing lightheadedness, SOB or CP?   A little dizzy  Do you have a history of afib (atrial fibrillation) or irregular heart rhythm?   Yes  Have you checked your BP or HR? (document readings if available):   HR 80 (11/3), 73 (11/5), 64 (11/5)  Are you experiencing any other symptoms?  Slight headache  Patient is concern about his HR readings bouncing up and down and palpitations.  Patient stated he was started on new medication (cilostazol (PLETAL) 100 MG tablet) by Dr. Chestine Spore, Vein Specialist.

## 2023-07-30 NOTE — Telephone Encounter (Signed)
Called and spoke to patient  Patient states:    -feels pretty good today   -HR is within his "normal" range 56-62   -BP this morning 159/70, checked it 10 minutes after taking BP medications  Patient denies:   -SOB/difficulty breathing   -Chest pain   -Palpitations  Advised patient to recheck BP in 1 hour  Patient scheduled for OV 11/7 with NP Monge at 2:20pm  Reviewed strict ED warning signs/precautions  Patient agrees with plan, no questions or concerns at this time

## 2023-07-31 ENCOUNTER — Ambulatory Visit: Payer: Medicare Other | Attending: Nurse Practitioner | Admitting: Nurse Practitioner

## 2023-07-31 ENCOUNTER — Other Ambulatory Visit: Payer: Self-pay | Admitting: Cardiovascular Disease

## 2023-07-31 ENCOUNTER — Encounter: Payer: Self-pay | Admitting: Nurse Practitioner

## 2023-07-31 ENCOUNTER — Ambulatory Visit: Payer: Medicare Other | Admitting: Adult Health

## 2023-07-31 VITALS — BP 144/64 | HR 53 | Ht 73.0 in | Wt 231.6 lb

## 2023-07-31 DIAGNOSIS — I350 Nonrheumatic aortic (valve) stenosis: Secondary | ICD-10-CM

## 2023-07-31 DIAGNOSIS — I2510 Atherosclerotic heart disease of native coronary artery without angina pectoris: Secondary | ICD-10-CM | POA: Diagnosis not present

## 2023-07-31 DIAGNOSIS — R001 Bradycardia, unspecified: Secondary | ICD-10-CM

## 2023-07-31 DIAGNOSIS — I739 Peripheral vascular disease, unspecified: Secondary | ICD-10-CM | POA: Diagnosis not present

## 2023-07-31 DIAGNOSIS — I10 Essential (primary) hypertension: Secondary | ICD-10-CM

## 2023-07-31 DIAGNOSIS — Z8679 Personal history of other diseases of the circulatory system: Secondary | ICD-10-CM

## 2023-07-31 DIAGNOSIS — E1159 Type 2 diabetes mellitus with other circulatory complications: Secondary | ICD-10-CM

## 2023-07-31 DIAGNOSIS — I4821 Permanent atrial fibrillation: Secondary | ICD-10-CM | POA: Diagnosis not present

## 2023-07-31 DIAGNOSIS — R918 Other nonspecific abnormal finding of lung field: Secondary | ICD-10-CM

## 2023-07-31 DIAGNOSIS — E785 Hyperlipidemia, unspecified: Secondary | ICD-10-CM

## 2023-07-31 DIAGNOSIS — Z9889 Other specified postprocedural states: Secondary | ICD-10-CM

## 2023-07-31 NOTE — Patient Instructions (Addendum)
Medication Instructions:  Your physician recommends that you continue on your current medications as directed. Please refer to the Current Medication list given to you today.  *If you need a refill on your cardiac medications before your next appointment, please call your pharmacy*   Lab Work: NONE ordered at this time of appointment     Testing/Procedures: NONE ordered at this time of appointment     Follow-Up: At [facility], you and your health needs are our priority.  As part of our continuing mission to provide you with exceptional heart care, we have created designated Provider Care Teams.  These Care Teams include your primary Cardiologist (physician) and Advanced Practice Providers (APPs -  Physician Assistants and Nurse Practitioners) who all work together to provide you with the care you need, when you need it.  We recommend signing up for the patient portal called "MyChart".  Sign up information is provided on this After Visit Summary.  MyChart is used to connect with patients for Virtual Visits (Telemedicine).  Patients are able to view lab/test results, encounter notes, upcoming appointments, etc.  Non-urgent messages can be sent to your provider as well.   To learn more about what you can do with MyChart, go to [email or web address].    Your next appointment:   2-4 week(s) Bernadene Person NP) Follow up after Echo (Dr. Tresa Endo) Provider:   Bernadene Person, NP        Other Instructions Monitor blood pressure and heart rate. Goal BP is less than 140/90. Check BP 2 hours after taking medications and 5-10 mins seated rest.

## 2023-07-31 NOTE — Progress Notes (Signed)
Office Visit    Patient Name: Derek Wells Date of Encounter: 07/31/2023  Primary Care Provider:  Emilio Aspen, MD Primary Cardiologist:  Nicki Guadalajara, MD  Chief Complaint    89 year old male with a history of CAD s/p CABG x 3 (LIMA-LAD, SVG-OM, SVG-PDA) in 2014, abdominal aortic aneurysm s/p repair in 2010, aortic stenosis, permanent atrial fibrillation, hypertension, hyperlipidemia, PAD, pulmonary nodules and type 2 diabetes who presents for follow-up related to CAD and aortic stenosis.   Past Medical History    Past Medical History:  Diagnosis Date   CAD (coronary artery disease), severe needing CABG 01/19/2013   Chest pain    2D ECHO, 06/16/2012 - EF 50-55%, borderline low left ventricular systolic function   Coronary artery disease    Dyspnea on exertion    LEXISCAN, 06/17/2012 - fixed inferior bowel artifact and basal septal defect, probably related to IVCD/incomplete LBBB, no reversible ischemia, post-stress EF 66%   History of AAA (abdominal aortic aneurysm) repair    ABDOMINAL AORTIC DOPPLER, 06/16/2013 - normal   HTN (hypertension) 01/19/2013   Hyperlipidemia LDL goal < 70 01/19/2013   Hypertension    S/P CABG x 3 01/19/2013   Past Surgical History:  Procedure Laterality Date   CARDIAC CATHETERIZATION  01/15/2013   Severe coronary obstructive disease with 60-70% ostial left main and 90-85% distal LAD stenosis and mild-moderate stenoses in RCA, recommended for CABG   CARDIOVERSION N/A 11/12/2013   Procedure: CARDIOVERSION;  Surgeon: Lennette Bihari, MD;  Location: [facility] ENDOSCOPY;  Service: Cardiovascular;  Laterality: N/A;  11:16 Lido 60mg , Propofol 120mg ,IV 11:19 synch 100 joules conversion conversion to NSR....   CARDIOVERSION N/A 05/13/2017   Procedure: CARDIOVERSION;  Surgeon: Elease Hashimoto Deloris Ping, MD;  Location: [facility] ENDOSCOPY;  Service: Cardiovascular;  Laterality: N/A;   CORONARY ARTERY BYPASS GRAFT N/A 01/15/2013   Procedure: Coronary Artery Bypass Graft times three  using Right Greater Saphenous Vein Graft harvested endoscopically and Left Internal Mammary Artery and;  Surgeon: Alleen Borne, MD;  Location: [facility] OR;  Service: Open Heart Surgery;  Laterality: N/A;   INTRAOPERATIVE TRANSESOPHAGEAL ECHOCARDIOGRAM  01/15/2013   Procedure: INTRAOPERATIVE TRANSESOPHAGEAL ECHOCARDIOGRAM;  Surgeon: Alleen Borne, MD;  Location: [facility] OR;  Service: Open Heart Surgery;;   LEFT HEART CATHETERIZATION WITH CORONARY ANGIOGRAM N/A 01/15/2013   Procedure: LEFT HEART CATHETERIZATION WITH CORONARY ANGIOGRAM;  Surgeon: Lennette Bihari, MD;  Location: [facility] CATH LAB;  Service: Cardiovascular;  Laterality: N/A;   VASCULAR SURGERY      Allergies  Allergies  Allergen Reactions   Penicillin G     Other reaction(s): rash   Penicillins Rash    Has patient had a PCN reaction causing immediate rash, facial/tongue/throat swelling, SOB or lightheadedness with hypotension: no Has patient had a PCN reaction causing severe rash involving mucus membranes or skin necrosis: no Has patient had a PCN reaction that required hospitalization: no Has patient had a PCN reaction occurring within the last 10 years: yes If all of the above answers are "NO", then may proceed with Cephalosporin use.      Labs/Other Studies Reviewed    The following studies were reviewed today:  Cardiac Studies & Procedures     STRESS TESTS  MYOCARDIAL PERFUSION IMAGING 07/23/2016  Narrative  The left ventricular ejection fraction is normal (55-65%).  Nuclear stress EF: 62%. No wall motion abnormalities  There was no ST segment deviation noted during stress. Rare PVC  This is a low risk study. No perfusion defects, no  ischemia  Donato Schultz, MD   ECHOCARDIOGRAM  ECHOCARDIOGRAM COMPLETE 11/04/2022  Narrative ECHOCARDIOGRAM REPORT    Patient Name:   Derek Wells  Date of Exam: 11/04/2022 Medical Rec #:  [id number]     Height:       73.0 in Accession #:    [id number]    Weight:       234.0 lb Date of  Birth:  1932/12/19     BSA:          2.300 m Patient Age:    89 years      BP:           140/52 mmHg Patient Gender: M             HR:           51 bpm. Exam Location:  [address]  Procedure: 2D Echo, 3D Echo, Cardiac Doppler and Color Doppler  Indications:    I35.0 Aortic Stenosis  History:        Patient has prior history of Echocardiogram examinations, most recent 10/26/2021. CAD, Prior CABG, Aortic Valve Disease, Signs/Symptoms:Chest Pain and Dyspnea; Risk Factors:Family History of Coronary Artery Disease, Hypertension, Diabetes, Dyslipidemia and Former Smoker. Aortic Stenosis (prior mean gradient= ), Prior AAA Repair.  Sonographer:    Farrel Conners RDCS Referring Phys: Lennette Bihari  IMPRESSIONS   1. Left ventricular ejection fraction, by estimation, is 55 to 60%. The left ventricle has normal function. The left ventricle has no regional wall motion abnormalities. Left ventricular diastolic parameters are indeterminate. 2. Right ventricular systolic function is mildly reduced. The right ventricular size is moderately enlarged. There is moderately elevated pulmonary artery systolic pressure. The estimated right ventricular systolic pressure is 52.9 mmHg. 3. Left atrial size was mildly dilated. 4. Right atrial size was mildly dilated. 5. The mitral valve is normal in structure. Trivial mitral valve regurgitation. No evidence of mitral stenosis. Moderate mitral annular calcification. 6. The aortic valve is tricuspid. There is severe calcifcation of the aortic valve. Aortic valve regurgitation is trivial. Mild aortic valve stenosis. Aortic valve area, by VTI measures 1.63 cm. Aortic valve mean gradient measures 19.0 mmHg. 7. Aortic dilatation noted. There is mild dilatation of the ascending aorta, measuring 38 mm. 8. The inferior vena cava is dilated in size with >50% respiratory variability, suggesting right atrial pressure of 8 mmHg. 9. The patient was in atrial  fibrillation.  FINDINGS Left Ventricle: Left ventricular ejection fraction, by estimation, is 55 to 60%. The left ventricle has normal function. The left ventricle has no regional wall motion abnormalities. The left ventricular internal cavity size was normal in size. There is no left ventricular hypertrophy. Left ventricular diastolic parameters are indeterminate.  Right Ventricle: The right ventricular size is moderately enlarged. No increase in right ventricular wall thickness. Right ventricular systolic function is mildly reduced. There is moderately elevated pulmonary artery systolic pressure. The tricuspid regurgitant velocity is 3.35 m/s, and with an assumed right atrial pressure of 8 mmHg, the estimated right ventricular systolic pressure is 52.9 mmHg.  Left Atrium: Left atrial size was mildly dilated.  Right Atrium: Right atrial size was mildly dilated.  Pericardium: There is no evidence of pericardial effusion.  Mitral Valve: The mitral valve is normal in structure. There is mild calcification of the mitral valve leaflet(s). Moderate mitral annular calcification. Trivial mitral valve regurgitation. No evidence of mitral valve stenosis.  Tricuspid Valve: The tricuspid valve is normal in structure. Tricuspid valve regurgitation is mild.  Aortic  Valve: The aortic valve is tricuspid. There is severe calcifcation of the aortic valve. Aortic valve regurgitation is trivial. Mild aortic stenosis is present. Aortic valve mean gradient measures 19.0 mmHg. Aortic valve peak gradient measures 31.0 mmHg. Aortic valve area, by VTI measures 1.63 cm.  Pulmonic Valve: The pulmonic valve was normal in structure. Pulmonic valve regurgitation is trivial.  Aorta: The aortic root is normal in size and structure and aortic dilatation noted. There is mild dilatation of the ascending aorta, measuring 38 mm.  Venous: The inferior vena cava is dilated in size with greater than 50% respiratory variability,  suggesting right atrial pressure of 8 mmHg.  IAS/Shunts: No atrial level shunt detected by color flow Doppler.   LEFT VENTRICLE PLAX 2D LVIDd:         5.20 cm   Diastology LVIDs:         2.45 cm   LV e' medial:    6.64 cm/s LV PW:         0.90 cm   LV E/e' medial:  19.2 LV IVS:        1.10 cm   LV e' lateral:   12.10 cm/s LVOT diam:     2.30 cm   LV E/e' lateral: 10.5 LV SV:         136 LV SV Index:   59 LVOT Area:     4.15 cm  3D Volume EF: 3D EF:        66 % LV EDV:       170 ml LV ESV:       58 ml LV SV:        113 ml  RIGHT VENTRICLE RV Basal diam:  4.45 cm RV Mid diam:    3.50 cm RV S prime:     14.80 cm/s TAPSE (M-mode): 1.9 cm  LEFT ATRIUM             Index        RIGHT ATRIUM           Index LA diam:        4.50 cm 1.96 cm/m   RA Area:     21.00 cm LA Vol (A2C):   89.2 ml 38.78 ml/m  RA Volume:   61.50 ml  26.74 ml/m LA Vol (A4C):   66.7 ml 29.00 ml/m LA Biplane Vol: 78.0 ml 33.91 ml/m AORTIC VALVE AV Area (Vmax):    1.90 cm AV Area (Vmean):   2.06 cm AV Area (VTI):     1.63 cm AV Vmax:           278.60 cm/s AV Vmean:          176.800 cm/s AV VTI:            0.834 m AV Peak Grad:      31.0 mmHg AV Mean Grad:      19.0 mmHg LVOT Vmax:         127.50 cm/s LVOT Vmean:        87.500 cm/s LVOT VTI:          0.328 m LVOT/AV VTI ratio: 0.39  AORTA Ao Root diam: 3.60 cm Ao Asc diam:  3.80 cm  MITRAL VALVE                TRICUSPID VALVE MV Area (PHT): cm          TR Peak grad:   44.9 mmHg MV Decel Time: 180 msec  TR Vmax:        335.00 cm/s MV E velocity: 127.33 cm/s MV A velocity: 41.97 cm/s   SHUNTS MV E/A ratio:  3.03         Systemic VTI:  0.33 m Systemic Diam: 2.30 cm  Dalton McleanMD Electronically signed by Wilfred Lacy Signature Date/Time: 11/04/2022/1:12:43 PM    Final    MONITORS  LONG TERM MONITOR (3-14 DAYS) 02/26/2021  Narrative The patient wore a Zio patch monitor for 13 days and 22 hours from May 9 through Feb 12, 2021.  The patient was in atrial fibrillation throughout the monitor with rates ranging from 32 on May 16 at 2:21 AM and 94 at 12:45 PM on May 10 (average rate of 50 bpm). T here is evidence for bundle branch block/IVCD.  There were occasional isolated PVCs (1.4%) without couplets or triplets.          Recent Labs: No results found for requested labs within last 365 days.  Recent Lipid Panel    Component Value Date/Time   CHOL 113 06/19/2022 1017   CHOL 111 03/01/2015 1033   TRIG 74 06/19/2022 1017   TRIG 90 03/01/2015 1033   HDL 39 (L) 06/19/2022 1017   HDL 41 03/01/2015 1033   CHOLHDL 2.9 06/19/2022 1017   CHOLHDL 4.1 07/19/2016 0949   VLDL 24 07/19/2016 0949   LDLCALC 59 06/19/2022 1017   LDLCALC 52 03/01/2015 1033    History of Present Illness    89 year old male with the above past medical history including CAD s/p CABG x 3 (LIMA-LAD, SVG-OM, SVG-PDA) in 2014, abdominal aortic aneurysm s/p repair in 2010, aortic stenosis, permanent atrial fibrillation, hypertension, hyperlipidemia, PAD, pulmonary nodules and type 2 diabetes.  He has a history of CAD s/p CABG x 3 in 12/2012.  He developed atrial fibrillation/atrial flutter in January 2015 and underwent successful DCCV for atrial flutter in 10/2013 with restoration of sinus rhythm.  He has been on chronic anticoagulation with Eliquis.  Nuclear study in 05/2014 in the setting of chest discomfort was low risk, EF 65%.  Echocardiogram at the time showed EF 60 to 65%, no RWMA, mild BAE.  Nuclear study in 06/2016 was low risk, EF 62%.  He developed recurrent atrial fibrillation in 2018 s/p repeat DCCV in 04/2017 with restoration of sinus rhythm.  Echocardiogram in October 2018 showed normal LV systolic and diastolic function, bilateral atrial enlargement, mild aortic stenosis, mean gradient 14 mmHg.  Cardiac monitor in 11/2018 showed atrial fibrillation with slow ventricular response (rate ranging from 33 to 97 bpm, average heart rate 53 bpm), 100%  atrial fibrillation burden.  Repeat cardiac monitor in 02/2021 showed continuous atrial fibrillation, HR ranging from 32 bpm to 94 bpm, average HR 50 bpm, evidence of bundle branch block/IVCD, occasional PVCs.  Echocardiogram in 10/2022 showed EF 55 to 60%, normal LV function, no RWMA, mildly reduced RV systolic function, moderate enlarged RV, moderately elevated PASP, moderate MAC, mild aortic stenosis, mean gradient 19 mmHg, mild dilation of the ascending aorta measuring 38 mm.  Repeat echocardiogram was recommended in 1 year.  CT of the chest in March 2024 showed multiple pulmonary nodules, repeat CT was recommended in 1 year.  Additionally, he has a history of PAD, follows with vascular surgery.  ABIs in 03/2023 indicated mild right lower extremity arterial disease, noncompressible left lower extremity arteries.  He was last seen in office on 05/12/2023 and was stable from a cardiac standpoint.  He contacted the office on  07/24/2023 with concern for elevated heart rate (80 bpm), generalized fatigue, since starting Pletal.  He contacted our office again on 07/29/2023 and noted mild dizziness.  He presents today for follow-up.  Since his last visit he has been stable from a cardiac standpoint.  Over the past week he has noticed increased fatigue, elevated HR (60s to 70 bpm), palpitations, dizziness, and fatigue.  He first noticed his symptoms after starting on Pletal.  He topped taking his Pletal 2 days ago and is waiting to see if his symptoms improve.  BP has been elevated.  He denies any chest pain, dyspnea, edema, PND, orthopnea, weight gain.  Denies presyncope or syncope.  Home Medications    Current Outpatient Medications  Medication Sig Dispense Refill   amLODipine (NORVASC) 5 MG tablet TAKE 1 TABLET(5 MG) BY MOUTH DAILY 90 tablet 3   Ascorbic Acid (VITAMIN C) 1000 MG tablet Take 1,000 mg by mouth daily. 1 Tablet Daily     atorvastatin (LIPITOR) 40 MG tablet TAKE 1 TABLET BY MOUTH EVERY DAY 90 tablet 2    cholecalciferol (VITAMIN D3) 25 MCG (1000 UNIT) tablet Take 1,000 Units by mouth daily. 1 Tablet Daily     cilostazol (PLETAL) 100 MG tablet Take 1 tablet (100 mg total) by mouth 2 (two) times daily before a meal. 60 tablet 11   COVID-19 mRNA bivalent vaccine, Pfizer, (PFIZER COVID-19 VAC BIVALENT) injection Inject into the muscle. 0.3 mL 0   COVID-19 mRNA vaccine 2023-2024 (COMIRNATY) syringe Inject into the muscle. 0.3 mL 0   ELIQUIS 5 MG TABS tablet TAKE 1 TABLET BY MOUTH TWICE DAILY 180 tablet 1   ezetimibe (ZETIA) 10 MG tablet TAKE 1 TABLET(10 MG) BY MOUTH DAILY 90 tablet 2   influenza vaccine adjuvanted (FLUAD QUADRIVALENT) 0.5 ML injection Inject into the muscle. 0.5 mL 0   irbesartan (AVAPRO) 150 MG tablet TAKE 1 TABLET BY MOUTH DAILY 90 tablet 3   latanoprost (XALATAN) 0.005 % ophthalmic solution 1 drop at bedtime.     metFORMIN (GLUCOPHAGE) 500 MG tablet Take 500 mg by mouth daily.     No current facility-administered medications for this visit.     Review of Systems    He denies chest pain, dyspnea, pnd, orthopnea, n, v, dizziness, syncope, edema, weight gain, or early satiety. All other systems reviewed and are otherwise negative except as noted above.   Physical Exam    VS:  BP (!) 144/64   Pulse (!) 53   Ht 6\' 1"  (1.854 m)   Wt 231 lb 9.6 oz (105.1 kg)   SpO2 94%   BMI 30.56 kg/m  GEN: Well nourished, well developed, in no acute distress. HEENT: normal. Neck: Supple, no JVD, carotid bruits, or masses. Cardiac: RRR, no murmurs, rubs, or gallops. No clubbing, cyanosis, edema.  Radials/DP/PT 2+ and equal bilaterally.  Respiratory:  Respirations regular and unlabored, clear to auscultation bilaterally. GI: Soft, nontender, nondistended, BS + x 4. MS: no deformity or atrophy. Skin: warm and dry, no rash. Neuro:  Strength and sensation are intact. Psych: Normal affect.  Accessory Clinical Findings    ECG personally reviewed by me today - EKG  Interpretation Date/Time:  Thursday July 31 2023 14:38:53 EST Ventricular Rate:  53 PR Interval:    QRS Duration:  140 QT Interval:  460 QTC Calculation: 431 R Axis:   -52  Text Interpretation: Atrial fibrillation with slow ventricular response Left axis deviation Non-specific intra-ventricular conduction block Minimal voltage criteria for LVH, may be  normal variant ( Cornell product ) Confirmed by Bernadene Person ([id number]) on 07/31/2023 2:57:03 PM  - no acute changes.   Lab Results  Component Value Date   WBC 9.1 11/08/2021   HGB 12.7 (L) 11/08/2021   HCT 39.9 11/08/2021   MCV 81 11/08/2021   PLT 248 11/08/2021   Lab Results  Component Value Date   CREATININE 0.89 06/19/2022   BUN 16 06/19/2022   NA 141 06/19/2022   K 4.6 06/19/2022   CL 104 06/19/2022   CO2 22 06/19/2022   Lab Results  Component Value Date   ALT 17 06/19/2022   AST 19 06/19/2022   ALKPHOS 99 06/19/2022   BILITOT 1.2 06/19/2022   Lab Results  Component Value Date   CHOL 113 06/19/2022   HDL 39 (L) 06/19/2022   LDLCALC 59 06/19/2022   TRIG 74 06/19/2022   CHOLHDL 2.9 06/19/2022    Lab Results  Component Value Date   HGBA1C 6.0 (H) 11/08/2021    Assessment & Plan    1. Permanent atrial fibrillation/bradycardia: Rate controlled with baseline bradycardia.  He has noted elevated heart rate recently compared to his baseline with HR in the 60s to 70 bpm, associated palpitations, dizziness, and fatigue.  He first notices symptoms after starting Pletal.  Reviewed potential adverse reactions of medication, palpitations, elevated heart rate are listed.  He stopped taking his Pletal 2 ago and is waiting to see if his symptoms improved. Per patient report, recent labs per see PCP were unremarkable. EKG today shows atrial fibrillation, slow ventricular response, HR 53 bpm.  Gust possible repeat cardiac monitor, repeat echocardiogram (currently scheduled for right 2025). Through shared decision making,  will defer  any further testing at this time and continue to monitor symptoms with plans for close follow-up. If symptoms persist, consider follow-up with A-fib clinic.  Continue Eliquis.  2. CAD: S/p CABG x 3 (LIMA-LAD, SVG-OM, SVG-PDA) in 2014.  Stable with no anginal symptoms.  Continue irbesartan, amlodipine, Lipitor, and Zetia.  3. Abdominal aortic aneurysm: S/p repair in 2010.  Most recent ultrasound in 02/2022 showed stable abdominal aorta measuring 2.6 cm.  No thrombus in the IVC.  Consider repeat ultrasound at follow-up.  4. Aortic stenosis: Most recent echo in 10/2022 showed EF 55 to 60%, normal LV function, no RWMA, mildly reduced RV systolic function, moderate enlarged RV, moderately elevated PASP, moderate MAC, mild aortic stenosis, mean gradient 19 mmHg, mild dilation of the ascending aorta measuring 38 mm.  Repeat echo currently scheduled for 10/2023.  5. Hypertension: BP has been moderately elevated recently.  Continue to monitor BP and report BP consistently greater than 140/80.  For now, continue current antihypertensive regimen.  6. Hyperlipidemia: LDL was 62 in 01/2023.  Continue Lipitor, Zetia.  7. PAD: ABIs in 03/2023 indicated mild right lower extremity arterial disease, noncompressible left lower extremity arteries.  Recently started on Pletal, on hold as above.  Denies worsening claudication.  Following with vascular surgery.  8. Pulmonary nodules: CT of the chest in March 2024 showed multiple pulmonary nodules, repeat CT was recommended in 1 year.  Following with PCP.  9. Type 2 diabetes: A1c was 6.2 in 01/2023.  Monitored and managed per PCP.  10. Disposition: Follow-up in 2 to 4 weeks with APP, follow-up with Dr. Tresa Endo per recall in 10/2022.   HYPERTENSION CONTROL Vitals:   07/31/23 1429 07/31/23 1516  BP: (!) 156/54 (!) 144/64    The patient's blood pressure is elevated above target today.  In order to address the patient's elevated BP: Blood pressure will be monitored at home  to determine if medication changes need to be made.; Follow up with general cardiology has been recommended.      Joylene Grapes, NP 07/31/2023, 6:44 PM

## 2023-08-06 ENCOUNTER — Other Ambulatory Visit (HOSPITAL_BASED_OUTPATIENT_CLINIC_OR_DEPARTMENT_OTHER): Payer: Self-pay

## 2023-08-06 MED ORDER — COMIRNATY 30 MCG/0.3ML IM SUSY
PREFILLED_SYRINGE | INTRAMUSCULAR | 0 refills | Status: DC
  Filled 2023-08-06: qty 0.3, 1d supply, fill #0

## 2023-08-08 DIAGNOSIS — H26493 Other secondary cataract, bilateral: Secondary | ICD-10-CM | POA: Diagnosis not present

## 2023-08-08 DIAGNOSIS — E119 Type 2 diabetes mellitus without complications: Secondary | ICD-10-CM | POA: Diagnosis not present

## 2023-08-08 DIAGNOSIS — H401131 Primary open-angle glaucoma, bilateral, mild stage: Secondary | ICD-10-CM | POA: Diagnosis not present

## 2023-08-08 DIAGNOSIS — H04123 Dry eye syndrome of bilateral lacrimal glands: Secondary | ICD-10-CM | POA: Diagnosis not present

## 2023-08-11 DIAGNOSIS — K080 Exfoliation of teeth due to systemic causes: Secondary | ICD-10-CM | POA: Diagnosis not present

## 2023-08-26 ENCOUNTER — Encounter: Payer: Self-pay | Admitting: Nurse Practitioner

## 2023-08-26 ENCOUNTER — Ambulatory Visit: Payer: Medicare Other | Attending: Nurse Practitioner | Admitting: Nurse Practitioner

## 2023-08-26 ENCOUNTER — Ambulatory Visit: Payer: Medicare Other | Admitting: Vascular Surgery

## 2023-08-26 VITALS — BP 148/50 | HR 53 | Resp 96 | Ht 73.0 in | Wt 230.0 lb

## 2023-08-26 DIAGNOSIS — I4821 Permanent atrial fibrillation: Secondary | ICD-10-CM

## 2023-08-26 DIAGNOSIS — I2510 Atherosclerotic heart disease of native coronary artery without angina pectoris: Secondary | ICD-10-CM | POA: Diagnosis not present

## 2023-08-26 DIAGNOSIS — I350 Nonrheumatic aortic (valve) stenosis: Secondary | ICD-10-CM

## 2023-08-26 DIAGNOSIS — Z9889 Other specified postprocedural states: Secondary | ICD-10-CM

## 2023-08-26 DIAGNOSIS — R918 Other nonspecific abnormal finding of lung field: Secondary | ICD-10-CM

## 2023-08-26 DIAGNOSIS — R001 Bradycardia, unspecified: Secondary | ICD-10-CM | POA: Diagnosis not present

## 2023-08-26 DIAGNOSIS — I714 Abdominal aortic aneurysm, without rupture, unspecified: Secondary | ICD-10-CM | POA: Diagnosis not present

## 2023-08-26 DIAGNOSIS — E785 Hyperlipidemia, unspecified: Secondary | ICD-10-CM

## 2023-08-26 DIAGNOSIS — Z8679 Personal history of other diseases of the circulatory system: Secondary | ICD-10-CM

## 2023-08-26 DIAGNOSIS — E1159 Type 2 diabetes mellitus with other circulatory complications: Secondary | ICD-10-CM

## 2023-08-26 DIAGNOSIS — I10 Essential (primary) hypertension: Secondary | ICD-10-CM

## 2023-08-26 DIAGNOSIS — I739 Peripheral vascular disease, unspecified: Secondary | ICD-10-CM

## 2023-08-26 NOTE — Patient Instructions (Addendum)
Medication Instructions:  Your physician recommends that you continue on your current medications as directed. Please refer to the Current Medication list given to you today.  *If you need a refill on your cardiac medications before your next appointment, please call your pharmacy*   Lab Work: NONE ordered at this time of appointment      Testing/Procedures: VAS [place] AAA Duplex Complex   Follow-Up: At [facility], you and your health needs are our priority.  As part of our continuing mission to provide you with exceptional heart care, we have created designated Provider Care Teams.  These Care Teams include your primary Cardiologist (physician) and Advanced Practice Providers (APPs -  Physician Assistants and Nurse Practitioners) who all work together to provide you with the care you need, when you need it.  We recommend signing up for the patient portal called "MyChart".  Sign up information is provided on this After Visit Summary.  MyChart is used to connect with patients for Virtual Visits (Telemedicine).  Patients are able to view lab/test results, encounter notes, upcoming appointments, etc.  Non-urgent messages can be sent to your provider as well.   To learn more about what you can do with MyChart, go to [email or web address].    Your next appointment:    Keep Follow up (Dr. Tresa Endo) EP (Dr. Lalla Brothers prior to apt with Dr. Tresa Endo)  Provider:   Nicki Guadalajara, MD     Other Instructions

## 2023-08-26 NOTE — Progress Notes (Signed)
Office Visit    Patient Name: Derek Wells Date of Encounter: 08/26/2023  Primary Care Provider:  Emilio Aspen, MD Primary Cardiologist:  Nicki Guadalajara, MD  Chief Complaint    89 year old male with a history of CAD s/p CABG x 3 (LIMA-LAD, SVG-OM, SVG-PDA) in 2014, abdominal aortic aneurysm s/p repair in 2010, aortic stenosis, permanent atrial fibrillation, hypertension, hyperlipidemia, PAD, pulmonary nodules and type 2 diabetes who presents for follow-up related to CAD and aortic stenosis.   Past Medical History    Past Medical History:  Diagnosis Date   CAD (coronary artery disease), severe needing CABG 01/19/2013   Chest pain    2D ECHO, 06/16/2012 - EF 50-55%, borderline low left ventricular systolic function   Coronary artery disease    Dyspnea on exertion    LEXISCAN, 06/17/2012 - fixed inferior bowel artifact and basal septal defect, probably related to IVCD/incomplete LBBB, no reversible ischemia, post-stress EF 66%   History of AAA (abdominal aortic aneurysm) repair    ABDOMINAL AORTIC DOPPLER, 06/16/2013 - normal   HTN (hypertension) 01/19/2013   Hyperlipidemia LDL goal < 70 01/19/2013   Hypertension    S/P CABG x 3 01/19/2013   Past Surgical History:  Procedure Laterality Date   CARDIAC CATHETERIZATION  01/15/2013   Severe coronary obstructive disease with 60-70% ostial left main and 90-85% distal LAD stenosis and mild-moderate stenoses in RCA, recommended for CABG   CARDIOVERSION N/A 11/12/2013   Procedure: CARDIOVERSION;  Surgeon: Lennette Bihari, MD;  Location: [facility] ENDOSCOPY;  Service: Cardiovascular;  Laterality: N/A;  11:16 Lido 60mg , Propofol 120mg ,IV 11:19 synch 100 joules conversion conversion to NSR....   CARDIOVERSION N/A 05/13/2017   Procedure: CARDIOVERSION;  Surgeon: Elease Hashimoto Deloris Ping, MD;  Location: [facility] ENDOSCOPY;  Service: Cardiovascular;  Laterality: N/A;   CORONARY ARTERY BYPASS GRAFT N/A 01/15/2013   Procedure: Coronary Artery Bypass Graft times three  using Right Greater Saphenous Vein Graft harvested endoscopically and Left Internal Mammary Artery and;  Surgeon: Alleen Borne, MD;  Location: [facility] OR;  Service: Open Heart Surgery;  Laterality: N/A;   INTRAOPERATIVE TRANSESOPHAGEAL ECHOCARDIOGRAM  01/15/2013   Procedure: INTRAOPERATIVE TRANSESOPHAGEAL ECHOCARDIOGRAM;  Surgeon: Alleen Borne, MD;  Location: [facility] OR;  Service: Open Heart Surgery;;   LEFT HEART CATHETERIZATION WITH CORONARY ANGIOGRAM N/A 01/15/2013   Procedure: LEFT HEART CATHETERIZATION WITH CORONARY ANGIOGRAM;  Surgeon: Lennette Bihari, MD;  Location: [facility] CATH LAB;  Service: Cardiovascular;  Laterality: N/A;   VASCULAR SURGERY      Allergies  Allergies  Allergen Reactions   Penicillin G     Other reaction(s): rash   Penicillins Rash    Has patient had a PCN reaction causing immediate rash, facial/tongue/throat swelling, SOB or lightheadedness with hypotension: no Has patient had a PCN reaction causing severe rash involving mucus membranes or skin necrosis: no Has patient had a PCN reaction that required hospitalization: no Has patient had a PCN reaction occurring within the last 10 years: yes If all of the above answers are "NO", then may proceed with Cephalosporin use.      Labs/Other Studies Reviewed    The following studies were reviewed today:  Cardiac Studies & Procedures     STRESS TESTS  MYOCARDIAL PERFUSION IMAGING 07/23/2016  Narrative  The left ventricular ejection fraction is normal (55-65%).  Nuclear stress EF: 62%. No wall motion abnormalities  There was no ST segment deviation noted during stress. Rare PVC  This is a low risk study. No perfusion defects, no  ischemia  Donato Schultz, MD   ECHOCARDIOGRAM  ECHOCARDIOGRAM COMPLETE 11/04/2022  Narrative ECHOCARDIOGRAM REPORT    Patient Name:   Derek Wells  Date of Exam: 11/04/2022 Medical Rec #:  [id number]     Height:       73.0 in Accession #:    [id number]    Weight:       234.0 lb Date of  Birth:  [birth year]     BSA:          2.300 m Patient Age:    89 years      BP:           140/52 mmHg Patient Gender: M             HR:           51 bpm. Exam Location:  [address]  Procedure: 2D Echo, 3D Echo, Cardiac Doppler and Color Doppler  Indications:    I35.0 Aortic Stenosis  History:        Patient has prior history of Echocardiogram examinations, most recent 10/26/2021. CAD, Prior CABG, Aortic Valve Disease, Signs/Symptoms:Chest Pain and Dyspnea; Risk Factors:Family History of Coronary Artery Disease, Hypertension, Diabetes, Dyslipidemia and Former Smoker. Aortic Stenosis (prior mean gradient= ), Prior AAA Repair.  Sonographer:    Farrel Conners RDCS Referring Phys: Lennette Bihari  IMPRESSIONS   1. Left ventricular ejection fraction, by estimation, is 55 to 60%. The left ventricle has normal function. The left ventricle has no regional wall motion abnormalities. Left ventricular diastolic parameters are indeterminate. 2. Right ventricular systolic function is mildly reduced. The right ventricular size is moderately enlarged. There is moderately elevated pulmonary artery systolic pressure. The estimated right ventricular systolic pressure is 52.9 mmHg. 3. Left atrial size was mildly dilated. 4. Right atrial size was mildly dilated. 5. The mitral valve is normal in structure. Trivial mitral valve regurgitation. No evidence of mitral stenosis. Moderate mitral annular calcification. 6. The aortic valve is tricuspid. There is severe calcifcation of the aortic valve. Aortic valve regurgitation is trivial. Mild aortic valve stenosis. Aortic valve area, by VTI measures 1.63 cm. Aortic valve mean gradient measures 19.0 mmHg. 7. Aortic dilatation noted. There is mild dilatation of the ascending aorta, measuring 38 mm. 8. The inferior vena cava is dilated in size with >50% respiratory variability, suggesting right atrial pressure of 8 mmHg. 9. The patient was in atrial  fibrillation.  FINDINGS Left Ventricle: Left ventricular ejection fraction, by estimation, is 55 to 60%. The left ventricle has normal function. The left ventricle has no regional wall motion abnormalities. The left ventricular internal cavity size was normal in size. There is no left ventricular hypertrophy. Left ventricular diastolic parameters are indeterminate.  Right Ventricle: The right ventricular size is moderately enlarged. No increase in right ventricular wall thickness. Right ventricular systolic function is mildly reduced. There is moderately elevated pulmonary artery systolic pressure. The tricuspid regurgitant velocity is 3.35 m/s, and with an assumed right atrial pressure of 8 mmHg, the estimated right ventricular systolic pressure is 52.9 mmHg.  Left Atrium: Left atrial size was mildly dilated.  Right Atrium: Right atrial size was mildly dilated.  Pericardium: There is no evidence of pericardial effusion.  Mitral Valve: The mitral valve is normal in structure. There is mild calcification of the mitral valve leaflet(s). Moderate mitral annular calcification. Trivial mitral valve regurgitation. No evidence of mitral valve stenosis.  Tricuspid Valve: The tricuspid valve is normal in structure. Tricuspid valve regurgitation is mild.  Aortic  Valve: The aortic valve is tricuspid. There is severe calcifcation of the aortic valve. Aortic valve regurgitation is trivial. Mild aortic stenosis is present. Aortic valve mean gradient measures 19.0 mmHg. Aortic valve peak gradient measures 31.0 mmHg. Aortic valve area, by VTI measures 1.63 cm.  Pulmonic Valve: The pulmonic valve was normal in structure. Pulmonic valve regurgitation is trivial.  Aorta: The aortic root is normal in size and structure and aortic dilatation noted. There is mild dilatation of the ascending aorta, measuring 38 mm.  Venous: The inferior vena cava is dilated in size with greater than 50% respiratory variability,  suggesting right atrial pressure of 8 mmHg.  IAS/Shunts: No atrial level shunt detected by color flow Doppler.   LEFT VENTRICLE PLAX 2D LVIDd:         5.20 cm   Diastology LVIDs:         2.45 cm   LV e' medial:    6.64 cm/s LV PW:         0.90 cm   LV E/e' medial:  19.2 LV IVS:        1.10 cm   LV e' lateral:   12.10 cm/s LVOT diam:     2.30 cm   LV E/e' lateral: 10.5 LV SV:         136 LV SV Index:   59 LVOT Area:     4.15 cm  3D Volume EF: 3D EF:        66 % LV EDV:       170 ml LV ESV:       58 ml LV SV:        113 ml  RIGHT VENTRICLE RV Basal diam:  4.45 cm RV Mid diam:    3.50 cm RV S prime:     14.80 cm/s TAPSE (M-mode): 1.9 cm  LEFT ATRIUM             Index        RIGHT ATRIUM           Index LA diam:        4.50 cm 1.96 cm/m   RA Area:     21.00 cm LA Vol (A2C):   89.2 ml 38.78 ml/m  RA Volume:   61.50 ml  26.74 ml/m LA Vol (A4C):   66.7 ml 29.00 ml/m LA Biplane Vol: 78.0 ml 33.91 ml/m AORTIC VALVE AV Area (Vmax):    1.90 cm AV Area (Vmean):   2.06 cm AV Area (VTI):     1.63 cm AV Vmax:           278.60 cm/s AV Vmean:          176.800 cm/s AV VTI:            0.834 m AV Peak Grad:      31.0 mmHg AV Mean Grad:      19.0 mmHg LVOT Vmax:         127.50 cm/s LVOT Vmean:        87.500 cm/s LVOT VTI:          0.328 m LVOT/AV VTI ratio: 0.39  AORTA Ao Root diam: 3.60 cm Ao Asc diam:  3.80 cm  MITRAL VALVE                TRICUSPID VALVE MV Area (PHT): cm          TR Peak grad:   44.9 mmHg MV Decel Time: 180 msec  TR Vmax:        335.00 cm/s MV E velocity: 127.33 cm/s MV A velocity: 41.97 cm/s   SHUNTS MV E/A ratio:  3.03         Systemic VTI:  0.33 m Systemic Diam: 2.30 cm  Dalton McleanMD Electronically signed by Wilfred Lacy Signature Date/Time: 11/04/2022/1:12:43 PM    Final    MONITORS  LONG TERM MONITOR (3-14 DAYS) 02/26/2021  Narrative The patient wore a Zio patch monitor for 13 days and 22 hours from May 9 through Feb 12, 2021.  The patient was in atrial fibrillation throughout the monitor with rates ranging from 32 on May 16 at 2:21 AM and 94 at 12:45 PM on May 10 (average rate of 50 bpm). T here is evidence for bundle branch block/IVCD.  There were occasional isolated PVCs (1.4%) without couplets or triplets.          Recent Labs: No results found for requested labs within last 365 days.  Recent Lipid Panel    Component Value Date/Time   CHOL 113 06/19/2022 1017   CHOL 111 03/01/2015 1033   TRIG 74 06/19/2022 1017   TRIG 90 03/01/2015 1033   HDL 39 (L) 06/19/2022 1017   HDL 41 03/01/2015 1033   CHOLHDL 2.9 06/19/2022 1017   CHOLHDL 4.1 07/19/2016 0949   VLDL 24 07/19/2016 0949   LDLCALC 59 06/19/2022 1017   LDLCALC 52 03/01/2015 1033    History of Present Illness    89 year old male with the above past medical history including CAD s/p CABG x 3 (LIMA-LAD, SVG-OM, SVG-PDA) in 2014, abdominal aortic aneurysm s/p repair in 2010, aortic stenosis, permanent atrial fibrillation, hypertension, hyperlipidemia, PAD, pulmonary nodules and type 2 diabetes.   He has a history of CAD s/p CABG x 3 in 12/2012.  He developed atrial fibrillation/atrial flutter in January 2015 and underwent successful DCCV for atrial flutter in 10/2013 with restoration of sinus rhythm.  He has been on chronic anticoagulation with Eliquis.  Nuclear study in 05/2014 in the setting of chest discomfort was low risk, EF 65%.  Echocardiogram at the time showed EF 60 to 65%, no RWMA, mild BAE.  Nuclear study in 06/2016 was low risk, EF 62%.  He developed recurrent atrial fibrillation in 2018 s/p repeat DCCV in 04/2017 with restoration of sinus rhythm.  Echocardiogram in October 2018 showed normal LV systolic and diastolic function, bilateral atrial enlargement, mild aortic stenosis, mean gradient 14 mmHg.  Cardiac monitor in 11/2018 showed atrial fibrillation with slow ventricular response (rate ranging from 33 to 97 bpm, average heart rate 53 bpm),  100% atrial fibrillation burden.  Repeat cardiac monitor in 02/2021 showed continuous atrial fibrillation, HR ranging from 32 bpm to 94 bpm, average HR 50 bpm, evidence of bundle branch block/IVCD, occasional PVCs.  Echocardiogram in 10/2022 showed EF 55 to 60%, normal LV function, no RWMA, mildly reduced RV systolic function, moderate enlarged RV, moderately elevated PASP, moderate MAC, mild aortic stenosis, mean gradient 19 mmHg, mild dilation of the ascending aorta measuring 38 mm.  Repeat echocardiogram was recommended in 1 year.  CT of the chest in March 2024 showed multiple pulmonary nodules, repeat CT was recommended in 1 year.  Additionally, he has a history of PAD, follows with vascular surgery.  ABIs in 03/2023 indicated mild right lower extremity arterial disease, noncompressible left lower extremity arteries.  He was last seen in office on 07/31/2023 and was stable from a cardiac standpoint.  He noted concern for  elevated heart rate (80 bpm), generalized fatigue, since starting Pletal, mild dizziness. BP was moderately elevated.  He wished to discontinue his Pletal to see if his symptoms improved prior to pursuing any additional cardiac testing.   He presents today for follow-up.  Since his last visit he has been stable from a cardiac standpoint. He notes generalized fatigue, lightheadedness when his heart rate is in the 40s.  This has been occurring more frequently over the past several weeks.  He denies any presyncope or syncope.  He denies chest pain, he has stable chronic dyspnea unchanged from prior visits.  Other than his intermittent lightheadedness and fatigue, he denies any additional concerns today.  Home Medications    Current Outpatient Medications  Medication Sig Dispense Refill   amLODipine (NORVASC) 5 MG tablet TAKE 1 TABLET(5 MG) BY MOUTH DAILY 90 tablet 3   Ascorbic Acid (VITAMIN C) 1000 MG tablet Take 1,000 mg by mouth daily. 1 Tablet Daily     atorvastatin (LIPITOR) 40 MG tablet  TAKE 1 TABLET BY MOUTH EVERY DAY 90 tablet 2   cholecalciferol (VITAMIN D3) 25 MCG (1000 UNIT) tablet Take 1,000 Units by mouth daily. 1 Tablet Daily     cilostazol (PLETAL) 100 MG tablet Take 1 tablet (100 mg total) by mouth 2 (two) times daily before a meal. 60 tablet 11   COVID-19 mRNA bivalent vaccine, Pfizer, (PFIZER COVID-19 VAC BIVALENT) injection Inject into the muscle. 0.3 mL 0   COVID-19 mRNA vaccine 2023-2024 (COMIRNATY) syringe Inject into the muscle. 0.3 mL 0   COVID-19 mRNA vaccine, Pfizer, (COMIRNATY) syringe Inject into the muscle. 0.3 mL 0   ELIQUIS 5 MG TABS tablet TAKE 1 TABLET BY MOUTH TWICE DAILY 180 tablet 1   ezetimibe (ZETIA) 10 MG tablet TAKE 1 TABLET(10 MG) BY MOUTH DAILY 90 tablet 2   influenza vaccine adjuvanted (FLUAD QUADRIVALENT) 0.5 ML injection Inject into the muscle. 0.5 mL 0   irbesartan (AVAPRO) 150 MG tablet TAKE 1 TABLET BY MOUTH DAILY 90 tablet 3   latanoprost (XALATAN) 0.005 % ophthalmic solution 1 drop at bedtime.     metFORMIN (GLUCOPHAGE) 500 MG tablet Take 500 mg by mouth daily.     No current facility-administered medications for this visit.     Review of Systems    He denies chest pain, palpitations, pnd, orthopnea, n, v, syncope, edema, weight gain, or early satiety. All other systems reviewed and are otherwise negative except as noted above.   Physical Exam    VS:  BP (!) 148/50 (BP Location: Right Arm, Patient Position: Sitting, Cuff Size: Large)   Pulse (!) 53   Resp (!) 96   Ht 6\' 1"  (1.854 m)   Wt 230 lb (104.3 kg)   BMI 30.34 kg/m   GEN: Well nourished, well developed, in no acute distress. HEENT: normal. Neck: Supple, no JVD, carotid bruits, or masses. Cardiac: IRIR, 3/6 murmur, no rubs, or gallops. No clubbing, cyanosis, edema.  Radials/DP/PT 2+ and equal bilaterally.  Respiratory:  Respirations regular and unlabored, clear to auscultation bilaterally. GI: Soft, nontender, nondistended, BS + x 4. MS: no deformity or  atrophy. Skin: warm and dry, no rash. Neuro:  Strength and sensation are intact. Psych: Normal affect.  Accessory Clinical Findings    ECG personally reviewed by me today - EKG Interpretation Date/Time:  Tuesday August 26 2023 14:16:35 EST Ventricular Rate:  51 PR Interval:    QRS Duration:  140 QT Interval:  462 QTC Calculation: 425 R Axis:   -  56  Text Interpretation: Atrial fibrillation with slow ventricular response Left axis deviation Non-specific intra-ventricular conduction block Minimal voltage criteria for LVH, may be normal variant ( Cornell product ) No significant change since last tracing Confirmed by Bernadene Person ([id number]) on 08/26/2023 2:25:09 PM  - no acute changes.   Lab Results  Component Value Date   WBC 9.1 11/08/2021   HGB 12.7 (L) 11/08/2021   HCT 39.9 11/08/2021   MCV 81 11/08/2021   PLT 248 11/08/2021   Lab Results  Component Value Date   CREATININE 0.89 06/19/2022   BUN 16 06/19/2022   NA 141 06/19/2022   K 4.6 06/19/2022   CL 104 06/19/2022   CO2 22 06/19/2022   Lab Results  Component Value Date   ALT 17 06/19/2022   AST 19 06/19/2022   ALKPHOS 99 06/19/2022   BILITOT 1.2 06/19/2022   Lab Results  Component Value Date   CHOL 113 06/19/2022   HDL 39 (L) 06/19/2022   LDLCALC 59 06/19/2022   TRIG 74 06/19/2022   CHOLHDL 2.9 06/19/2022    Lab Results  Component Value Date   HGBA1C 6.0 (H) 11/08/2021    Assessment & Plan   1. Permanent atrial fibrillation/bradycardia: Rate controlled with baseline bradycardia.  He previously noted elevated heart rate while taking Pletal.  He discontinued the medication and his HR returned to baseline.  However, he has also noticed increasing bradycardia with associated lightheadedness/dizziness, HR in the 40s bpm.  He was previously evaluated by EP (2022), it was noted that he would likely require pacemaker in the future.  EKG today shows atrial fibrillation, slow ventricular response, HR 53 bpm.  He  declined repeat cardiac monitor.  Given symptomatic bradycardia, will have him follow-up with Dr. Lalla Brothers, EP.  He may likely require pacemaker in the future.  Repeat echo pending as below.  Continue Eliquis.   2. CAD: S/p CABG x 3 (LIMA-LAD, SVG-OM, SVG-PDA) in 2014.  Stable with no anginal symptoms.  Continue irbesartan, amlodipine, Lipitor, and Zetia.   3. Abdominal aortic aneurysm: S/p repair in 2010.  Most recent ultrasound in 02/2022 showed stable abdominal aorta measuring 2.6 cm.  No thrombus in the IVC.  Will repeat abdominal aortic ultrasound for monitoring.    4. Aortic stenosis: Most recent echo in 10/2022 showed EF 55 to 60%, normal LV function, no RWMA, mildly reduced RV systolic function, moderate enlarged RV, moderately elevated PASP, moderate MAC, mild aortic stenosis, mean gradient 19 mmHg, mild dilation of the ascending aorta measuring 38 mm.  He has stable chronic dyspnea, occasional lightheadedness. Euvolemic and well compensated on exam.  Repeat echo currently scheduled for 10/2023.   5. Hypertension: BP has been moderately elevated recently.  Continue to monitor BP and report SBP consistently greater than 150.  For now, continue current antihypertensive regimen.   6. Hyperlipidemia: LDL was 62 in 01/2023.  Continue Lipitor, Zetia.   7. PAD: ABIs in 03/2023 indicated mild right lower extremity arterial disease, noncompressible left lower extremity arteries.  Recently started on Pletal, he did not seem to tolerate this medication.  Denies worsening claudication.  Following with vascular surgery.   8. Pulmonary nodules: CT of the chest in March 2024 showed multiple pulmonary nodules, repeat CT was recommended in 1 year.  Following with PCP.   9. Type 2 diabetes: A1c was 6.2 in 01/2023.  Monitored and managed per PCP.   10. Disposition: Follow-up with EP in 1 month, follow-up with Dr. Tresa Endo as scheduled in  10/2022.     Joylene Grapes, NP 08/26/2023, 2:58 PM

## 2023-09-20 ENCOUNTER — Other Ambulatory Visit (HOSPITAL_COMMUNITY): Payer: Self-pay | Admitting: Cardiovascular Disease

## 2023-09-20 DIAGNOSIS — I4819 Other persistent atrial fibrillation: Secondary | ICD-10-CM

## 2023-09-22 NOTE — Telephone Encounter (Signed)
Prescription refill request for Eliquis received. Indication:afib Last office visit:12/24 Scr:0.89  5/24 Age: 89 Weight:104.3  kg  Prescription refilled

## 2023-10-08 ENCOUNTER — Encounter: Payer: Self-pay | Admitting: Cardiology

## 2023-10-08 ENCOUNTER — Ambulatory Visit: Payer: Medicare Other | Attending: Cardiology | Admitting: Cardiology

## 2023-10-08 VITALS — BP 128/68 | HR 50 | Ht 73.0 in | Wt 232.4 lb

## 2023-10-08 DIAGNOSIS — I4821 Permanent atrial fibrillation: Secondary | ICD-10-CM | POA: Diagnosis not present

## 2023-10-08 DIAGNOSIS — R001 Bradycardia, unspecified: Secondary | ICD-10-CM

## 2023-10-08 NOTE — Progress Notes (Signed)
 Electrophysiology Office Follow up Visit Note:    Date:  10/08/2023   ID:  Derek Wells, DOB Jun 11, 1933, MRN [id number]  PCP:  Benedetta Bradley, MD  [facility] Cardiologist:  Magnus Schuller, MD  [facility] Electrophysiologist:  Boyce Byes, MD    Interval History:     Derek Wells is a 89 y.o. male who presents for a follow up visit.  I last saw the patient May 01, 2021 for bradycardia and atrial fibrillation.  He has a history of AAA, coronary disease with a CABG in 2014, atrial fibrillation and flutter.  He is also followed by Dr. Loetta Ringer.  When I last saw him in 2022 he was experiencing symptomatic bradycardia.  We discussed permanent pacemaker implant.    Since I last saw him he reports continued fatigue.  He now reports a link between heart rates and symptoms.  When his heart rates are in the low 40s he feels much more fatigued than when his heart rate is higher.  No syncope or presyncope.         Past medical, surgical, social and family history were reviewed.  ROS:   Please see the history of present illness.    All other systems reviewed and are negative.  EKGs/Labs/Other Studies Reviewed:    The following studies were reviewed today:     EKG Interpretation Date/Time:  Wednesday October 08 2023 16:54:20 EST Ventricular Rate:  50 PR Interval:    QRS Duration:  142 QT Interval:  468 QTC Calculation: 426 R Axis:   250  Text Interpretation: Atrial fibrillation with slow ventricular response Right superior axis deviation Non-specific intra-ventricular conduction block Confirmed by Harvie Liner [phone]) on 10/08/2023 5:03:28 PM    Physical Exam:    VS:  BP 128/68 (BP Location: Left Arm, Patient Position: Sitting, Cuff Size: Large)   Pulse (!) 50   Ht 6\' 1"  (1.854 m)   Wt 232 lb 6.4 oz (105.4 kg)   SpO2 97%   BMI 30.66 kg/m     Wt Readings from Last 3 Encounters:  10/08/23 232 lb 6.4 oz (105.4 kg)  08/26/23 230 lb (104.3 kg)  07/31/23  231 lb 9.6 oz (105.1 kg)     GEN: no distress, bradycardic CARD: Irregularly irregular, No MRG RESP: No IWOB. CTAB.      ASSESSMENT:    1. Permanent atrial fibrillation (HCC)   2. Bradycardia    PLAN:    In order of problems listed above:  #Permanent atrial fibrillation On Eliquis  for stroke prophylaxis. Has a slow ventricular response  #Symptomatic bradycardia The patient has symptomatic bradycardia.  This is a longstanding issue but he now correlates his rates to worsening of symptoms.  We have discussed pacemaker in the past but today I recommended permanent pacemaker implant.  I do think we have a very reasonable chance of improving his functional status with a permanent pacemaker in place and better chronotropic response.  I discussed the permanent pacemaker procedure in detail including the risks and recovery.  He has an appointment coming up with Dr. Loetta Ringer.  He will let us  know how he would like to proceed after he sees Dr. Loetta Ringer.  If he decides to proceed with permanent pacemaker implant, he will hold Eliquis  for 3 days prior to the procedure.  Risks, benefits, alternatives to PPM implantation were discussed in detail with the patient today. The patient understands that the risks include but are not limited to bleeding, infection, pneumothorax, perforation,  tamponade, vascular damage, renal failure, MI, stroke, death, and lead dislodgement and wishes to proceed.  We will therefore schedule device implantation at the next available time.  Echo pending November 03, 2023.   Signed, Harvie Liner, MD, [facility], [facility] 10/08/2023 8:00 PM    Electrophysiology [facility]

## 2023-10-08 NOTE — Patient Instructions (Signed)
 Medication Instructions:  Your physician recommends that you continue on your current medications as directed. Please refer to the Current Medication list given to you today.  *If you need a refill on your cardiac medications before your next appointment, please call your pharmacy*  Follow-Up: At [facility], you and your health needs are our priority.  As part of our continuing mission to provide you with exceptional heart care, we have created designated Provider Care Teams.  These Care Teams include your primary Cardiologist (physician) and Advanced Practice Providers (APPs -  Physician Assistants and Nurse Practitioners) who all work together to provide you with the care you need, when you need it.  Call us  if you decide that you would like to proceed with pacemaker implant

## 2023-10-20 ENCOUNTER — Ambulatory Visit (HOSPITAL_COMMUNITY)
Admission: RE | Admit: 2023-10-20 | Discharge: 2023-10-20 | Disposition: A | Discharge status: Home / Self Care | Payer: Medicare Other | Source: Ambulatory Visit | Attending: Cardiology | Admitting: Cardiology

## 2023-10-20 DIAGNOSIS — I714 Abdominal aortic aneurysm, without rupture, unspecified: Secondary | ICD-10-CM | POA: Diagnosis not present

## 2023-11-03 ENCOUNTER — Ambulatory Visit (HOSPITAL_COMMUNITY): Payer: Medicare Other | Attending: Cardiovascular Disease

## 2023-11-03 ENCOUNTER — Other Ambulatory Visit (HOSPITAL_COMMUNITY): Payer: Medicare Other

## 2023-11-03 DIAGNOSIS — E785 Hyperlipidemia, unspecified: Secondary | ICD-10-CM | POA: Insufficient documentation

## 2023-11-03 DIAGNOSIS — I350 Nonrheumatic aortic (valve) stenosis: Secondary | ICD-10-CM | POA: Diagnosis not present

## 2023-11-03 DIAGNOSIS — Z951 Presence of aortocoronary bypass graft: Secondary | ICD-10-CM | POA: Diagnosis not present

## 2023-11-03 DIAGNOSIS — I2510 Atherosclerotic heart disease of native coronary artery without angina pectoris: Secondary | ICD-10-CM | POA: Insufficient documentation

## 2023-11-03 DIAGNOSIS — I361 Nonrheumatic tricuspid (valve) insufficiency: Secondary | ICD-10-CM | POA: Diagnosis not present

## 2023-11-03 DIAGNOSIS — I10 Essential (primary) hypertension: Secondary | ICD-10-CM | POA: Insufficient documentation

## 2023-11-03 LAB — ECHOCARDIOGRAM COMPLETE
AR max vel: 1.18 cm2
AV Area VTI: 1.14 cm2
AV Area mean vel: 1.09 cm2
AV Mean grad: 27 mm[Hg]
AV Peak grad: 43.8 mm[Hg]
Ao pk vel: 3.31 m/s
S' Lateral: 3.2 cm

## 2023-11-05 ENCOUNTER — Ambulatory Visit: Payer: Medicare Other | Admitting: Cardiovascular Disease

## 2023-11-05 ENCOUNTER — Ambulatory Visit: Payer: Medicare Other | Attending: Cardiovascular Disease | Admitting: Cardiovascular Disease

## 2023-11-05 ENCOUNTER — Encounter: Payer: Self-pay | Admitting: Cardiovascular Disease

## 2023-11-05 DIAGNOSIS — E785 Hyperlipidemia, unspecified: Secondary | ICD-10-CM

## 2023-11-05 DIAGNOSIS — Z7901 Long term (current) use of anticoagulants: Secondary | ICD-10-CM

## 2023-11-05 DIAGNOSIS — Z951 Presence of aortocoronary bypass graft: Secondary | ICD-10-CM | POA: Diagnosis not present

## 2023-11-05 DIAGNOSIS — I4821 Permanent atrial fibrillation: Secondary | ICD-10-CM

## 2023-11-05 DIAGNOSIS — I350 Nonrheumatic aortic (valve) stenosis: Secondary | ICD-10-CM

## 2023-11-05 DIAGNOSIS — Z8679 Personal history of other diseases of the circulatory system: Secondary | ICD-10-CM

## 2023-11-05 DIAGNOSIS — I2510 Atherosclerotic heart disease of native coronary artery without angina pectoris: Secondary | ICD-10-CM | POA: Diagnosis not present

## 2023-11-05 DIAGNOSIS — E1159 Type 2 diabetes mellitus with other circulatory complications: Secondary | ICD-10-CM

## 2023-11-05 DIAGNOSIS — I10 Essential (primary) hypertension: Secondary | ICD-10-CM

## 2023-11-05 DIAGNOSIS — R918 Other nonspecific abnormal finding of lung field: Secondary | ICD-10-CM

## 2023-11-05 DIAGNOSIS — R001 Bradycardia, unspecified: Secondary | ICD-10-CM

## 2023-11-05 DIAGNOSIS — Z9889 Other specified postprocedural states: Secondary | ICD-10-CM

## 2023-11-05 DIAGNOSIS — I739 Peripheral vascular disease, unspecified: Secondary | ICD-10-CM

## 2023-11-05 NOTE — Addendum Note (Signed)
Addended by: Nicki Guadalajara A on: 11/05/2023 05:07 PM   Modules accepted: Orders

## 2023-11-05 NOTE — Patient Instructions (Addendum)
Medication Instructions:  No medication changes were made during today's visit  *If you need a refill on your cardiac medications before your next appointment, please call your pharmacy*   Lab Work: Return for fasting labs If you have labs (blood work) drawn today and your tests are completely normal, you will receive your results only by: MyChart Message (if you have MyChart) OR A paper copy in the mail If you have any lab test that is abnormal or we need to change your treatment, we will call you to review the results.   Testing/Procedures: Non-Cardiac CT scanning, (CAT scanning), is a noninvasive, special x-ray that produces cross-sectional images of the body using x-rays and a computer IN [facility] 2025. CT scans help physicians diagnose and treat medical conditions. For some CT exams, a contrast material is used to enhance visibility in the area of the body being studied. CT scans provide greater clarity and reveal more details than regular x-ray exams.    Your physician has requested that you have an echocardiogram IN AUGUST 2025. Echocardiography is a painless test that uses sound waves to create images of your heart. It provides your doctor with information about the size and shape of your heart and how well your heart's chambers and valves are working. This procedure takes approximately one hour. There are no restrictions for this procedure. Please do NOT wear cologne, perfume, aftershave, or lotions (deodorant is allowed). Please arrive 15 minutes prior to your appointment time.  Please note: We ask at that you not bring children with you during ultrasound (echo/ vascular) testing. Due to room size and safety concerns, children are not allowed in the ultrasound rooms during exams. Our front office staff cannot provide observation of children in our lobby area while testing is being conducted. An adult accompanying a patient to their appointment will only be allowed in the ultrasound room  at the discretion of the ultrasound technician under special circumstances. We apologize for any inconvenience.    Follow-Up: At [facility], you and your health needs are our priority.  As part of our continuing mission to provide you with exceptional heart care, we have created designated Provider Care Teams.  These Care Teams include your primary Cardiologist (physician) and Advanced Practice Providers (APPs -  Physician Assistants and Nurse Practitioners) who all work together to provide you with the care you need, when you need it.  We recommend signing up for the patient portal called "MyChart".  Sign up information is provided on this After Visit Summary.  MyChart is used to connect with patients for Virtual Visits (Telemedicine).  Patients are able to view lab/test results, encounter notes, upcoming appointments, etc.  Non-urgent messages can be sent to your provider as well.   To learn more about what you can do with MyChart, go to [email or web address].    Your next appointment:   6 month(s)  Provider:   Dr. Tonny Bollman    Other Instructions Thank you for choosing [facility]!

## 2023-11-05 NOTE — Progress Notes (Signed)
ID: Derek Wells, male   DOB: [birth year], 89 y.o.   MRN: [id number]        HPI: Derek Wells is a 89 y.o. male who presents to the office today for a 6 month followup cardiology evaluation.   Derek Wells has a history of hypertension, hyperlipidemia, and is s/p abdominal aortic aneurysm repair in April 2010.  In September 2013 he had undergone a nuclear perfusion study which was essentially normal although did show mild diaphragmatic attenuation. He also was found to have a atherogenic dyslipidemia pattern with reference to his lipid panel. When I saw him on 01/14/2013 he had experienced symptoms worrisome for unstable angina. Catheterization the following day on 01/15/2013 revealed life-threatening coronary anatomy with 60-70% ostial left main stenosis, 90-95% distal left main stenosis, 95-99% ostial LAD stenosis, and mild/moderate stenoses in the RCA. That same day he was successfully operated by Dr. Rexanne Mano and underwent CABG x3 with a LIMA to the LAD, SVG to the OM, and SVG to the PDA. He had a unremarkable postoperative course and was discharged 4 days later.  He saw Derek Bienenstock, PA on 01/27/13 and was doing well. I saw him in followup on 03/02/2013 and he  participating  in cardiac rehabilitation. When I saw him in September 2014, he was in sinus rhythm and has and had resumed a good level of activity.   In January 2015, he was found to be in atrial flutter at cardiac rehabilitation. He was unaware of his heart rhythm being abnormal. He had developed a URI over the holidays prior to that and had just completed a steroid dose pack. Xarelto 20 mg was added to his medical regimen. He did undergo subsequent blood work on January 20 which showed normal renal function and normal electrolytes. Liver function studies were normal. Hemoglobin was 13.7 hematocrit 41.7. Platelets were normal. TSH was normal at 2.63. Magnesium was normal at 2.0.  He underwent successful cardioversion for his atrial flutter on  11/12/2013 and he cardioverted to sinus rhythm at 100 J.  Since his cardioversion, he is unaware of any recurrent rhythm disturbance.   He is in the maintenance phase of cardiac rehabilitation.  When I  saw him in June 2015, he told me that he had noticed several episodes of a vague chest sensation particularly when he rolls his garbage can back up a hill at home.    On 03/18/2014, he underwent a nuclear perfusion study, which showed reduced exercise capacity with a workload of only 5.9.  He did experience mild shortness of breath.  There was no significant ST depression, but he had developed transient left bundle branch block.  The scan was interpreted at low as low risk.  Ejection fraction was 65% without wall motion abnormalities.  An echo Doppler study showed an EF of 60-65%.  There were no regional wall motion abnormalities.  He had mild biatrial enlargement.  Derek Wells currently is playing golf at least 2 times per week and occasionally 3.  He is in the maintenance phase of cardiac rehabilitation.  He does admit to some weight gain.  He denies any episodes of chest pain, particularly during the cardiac rehabilitation program.  He has noticed some mild shortness of breath with deep breathing and some vague pressure in his chest which is short-lived walking up hills when he walks fast with his wife.  He has noticed some mild shortness of breath walking up steep hills in the golf [place] without chest pressure.  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 2016.  When I saw him in 2017 he was anemic and had significantly reduced iron saturation at 6%, and his serum ferritin was 7.  I referred him for GI evaluation and underwent both colonoscopy and endoscopy by Dr. Carman Wells and 4 polyps were removed from his colon.  He was not having any active bleeding.  He does note improved energy.  He had taken Niferex initially and transitioned to over-the-counter iron. He has more energy.  He  is continuing with cardiac rehabilitation.  He underwent a follow-up abdominal ultrasound on 06/09/2015 which showed a patent aortobifemoral bypass graft without focal dilatation or stenosis.   In the late summer of 2017 he was at the beach for several weeks and played golf heat without chest pain.  However with fast walking he noted chest pressure.  For the past month he has had a cough.  He has a ventral hernia.  When I saw him, his ECG showed progressive PR prolongation.  I reduced his Lopressor back to 25 mg daily and added amlodipine 5 mg blood pressure and his chest tightness.  I scheduled him for a nuclear perfusion study which was done on 07/23/2016.  Ejection fraction was 62%.  There were no ECG changes ischemia, but there was a rare PVC.  He had normal perfusion without scar or ischemia.  His follow-up laboratory showed significant improvement with his iron saturation, now at 27%.  Lipid studies revealed a total cholesterol 164, triglycerides 122, HDL 40, and LDL 100.    When I saw him in January 2018, I suggested the addition of Zetia to his atorvastatin.  In the past.  He did not tolerate 80 mg, atorvastatin, and also had myalgias on 40 mg.  I also discussed plaque regression studies with Repatha.  He developed recurrent atrial fibrillation and had multiple evaluations in the A. fib clinic commencing in July 2018.  Ultimately, on 05/13/2017, he underwent successful cardioversion with restoration of sinus rhythm.  He saw Rudi Coco in follow-up of his cardioversion and was maintaining sinus rhythm.  There was discussion concerning his EtOH use and reduction in consumption.  He typically has at least 1 or more vodka every day.    When I lsaw him in February 2019 he was continuing to feel well and denied any recurrent episodes of atrial fibrillation.  His last echo Doppler study in October 2018 showed normal LV systolic and diastolic function.  He had biatrial enlargement with mild aortic valve  stenosis, and mean gradient of 14 and peak gradient of 25.  On follow-up abdominal aortic Doppler study the largest aortic measurement was 2.7 cm.   He was playing golf several days per week and was continue to participate in cardiac rehab.  On the evening of May 12, 2018 he was going up steps to bed developed an episode of full sensation in his throat chest with some radiate this was associated with palpitations.  Symptoms lasted approximately 10 minutes.  He was seen in the following morning relation clinic and was found to be in atrial flutter with a controlled ventricular rate.  He was scheduled to undergo DC cardioversion but upon arrival for his cardioversion procedure his ECG showed that he had converted back to sinus rhythm and had PACs.  I last saw on May 29, 2018.  He admitted to occasional lightheadedness and sensation of head fog.  He denied any exertional chest tightness.  He was continuing to drink occasional vodka but admits that his drinking is less.  He was sleeping well.  He denied any significant snoring or daytime sleepiness.  Gwyndolyn Kaufman revealed normal thyroid function studies.  History was normal although glucose was minimally increased at 103.  CBC was normal.  LDL cholesterol was 71 but triglycerides are elevated at 173 total cholesterol 143 and HDL 37.  Because of increased cardiac murmur on auscultation he underwent a follow-up echo Doppler study in June 25, 2018.  This continued to show excellent ejection fraction at 60 to 65% with grade 2 diastolic dysfunction.  There was mild aortic valve stenosis with a mean gradient of 14, peak instantaneous gradient of 29, and calculated valve area of 1.6 8 cm.  When I saw him last on July 07, 2018 he was maintaining sinus rhythm and was without chest pain PND orthopnea although he still noted mild shortness of breath when bending over.  He was playing golf several days per week.    Since I last saw him in October 2019  he apparently  has had recurrent issues with atrial fibrillation.  He has been seen by [name], PA, Azalee Course, [place],  Rudi Coco, NP and Edd Fabian, NP.  Apparently he has been in longstanding persistent atrial fibrillation for well over a year.  He has been on chronic anticoagulation with Eliquis.  When I saw him in April 2021 after not having seen him in 18 months he denied any recurrent anginal symptomatology.  He was continuing to play golf 2 days/week and had experienced some shortness of breath with uphill walking.  He was walking his dog daily.  Since he had not had an echo in over 2 years I recommended a follow-up echo Doppler study which was done on 02/14/2020.  This revealed an EF of 60 to 65%.  He had mildly increased pulmonary artery systolic pressure 36.5 mm.  There was evidence for mild aortic valve stenosis with a mean gradient of 13.8 and peak gradient of 22.8.  Pulmonary artery systolic pressure was mildly increased at 36.5.  He had normal atrial dimensions.    I saw him in October 2021.  Unfortunately, recently his wife broke her hip and required hip surgery and rehab therapy.  He has persistent atrial fibrillation and has been on Eliquis 5 mg twice a day.  At that time, his ECG showed atrial fibrillation at 54 bpm.  He continued to be on amlodipine 2.5 mg, irbesartan 150 mg and furosemide 20 mg daily.  He is on atorvastatin 40 mg for hyperlipidemia.  He is on Metformin 500 mg daily.    He underwent an echo Doppler study on January 08, 2021 which showed an EF of 55 to 60% without wall motion abnormalities.  There was mild biatrial enlargement.  There was mild aortic stenosis with a mean gradient of 14 mm and estimated AVA at 1.65 cm.  Abdominal aorta Doppler study on January 05, 2021 showed moderate atherosclerosis throughout the iliac arteries.  The exam was limited due to overlying bowel.  There was no evidence for obvious recurrent aneurysm.  He called our office in May 2022 with reports of  bradycardia with rates in the 40s as well as fatigability.  He was evaluated by Marjie Skiff on Feb 01, 2021.  He reported heart rates in the 40s with significant fatigability.  He subsequently wore a Zio patch monitor from May 9 through May 23.  He was in atrial fibrillation throughout  the monitor with rates ranging from the lowest at 32 on May 16 at 2:21 AM and the highest at 2 at 12:45 PM on May 10 with an average rate of 50 bpm.  He had bundle branch block/IVCD D.  There are occasional isolated PVCs without couplets or triplets.  I saw him on March 19, 2021 he continued to have periods of fatigability.    Typically his heart rate is in the low to mid 40s.  However on June 18 at 10:43 AM he reported his heart rate was around 38 bpm.  He also has noted some left calf claudication symptomatology.  He admits to decreased energy.  He still playing golf 2 days/week.  He denies recurrent chest pain.  During that evaluation, his blood pressure was stable on amlodipine 5 mg, irbesartan 150 mg and he was taking furosemide 20 mg daily.  There were no signs of volume overload and I suggested he try decreasing furosemide to every other day or as needed.  With his progressive left calf claudication symptomatology I recommended a lower extremity arterial Doppler study.  He underwent his Doppler study which raise concern for progressive PVD and he was referred to Dr. Allyson Sabal.  He subsequently was evaluated by Dr. Sherald Hess of [facility] since Dr. Hart Rochester had previously done his open AAA repair with aorto by iliac bypass.  Initially, a conservative approach was recommended with plans for follow-up in approximately 6 months.  If intervention is to be performed a brachial approach in the left arm to treat his left SFA high-grade stenosis would be necessary.  I saw Mr. Rugg on September 27, 2021 at which time he felt well and denied any chest pain.   He continues to be in longstanding persistent atrial fibrillation.  He denies  presyncope or syncope.  His wife had recently tested positive for COVID before Christmas and he tested positive as well and received treatment with Paxlovid and tested -2 days prior to this evaluation.  He was having claudication issues and remotely had undergone open AAA repair with aorto by iliac bypass surgery by Dr. Hart Rochester and had developed left calf claudication with Doppler imaging on April 11, 2021 demonstrating high-grade proximal right SFA stenosis with velocity of 411 consistent with 75 to 99% stenosis and on the left and mid SFA high-grade stenosis with velocity of 406 also consistent with 75 to 99% stenosis.  He was evaluated by Dr. Chestine Spore.  During his evaluation I discussed further aggressive lipid-lowering therapy and recommended a retrial of adding Zetia 10 mg to his atorvastatin 40 mg.  Remotely he could not tolerate atorvastatin 80 mg.  He underwent a follow-up echo Doppler study in February 2023 which showed LV function with EF 60 to 65%, mild LVH.  There was severe left atrial dilatation with moderate right atrial dilatation.  There was moderate aortic valve stenosis with mild MR care.  Aortic peak gradient was 35 mm with mean gradient of 20 and estimated valve area 1.7 cm.  There was mild dilation of his ascending aorta.  When I saw Mr. Satz on Jan 29, 2022  he denied any recurrent chest pain.  He had noted some mild occasional shortness of breath episodes.   He continues to play golf approximately 2 days/week.  He uses a golf cart with a flag to allow him to take the cart to his ball where it is located.  He is planning to see Dr. Chestine Spore for follow-up vascular surgery evaluation in light of  his left leg greater than right claudication symptoms.  He admits to some shortness of breath particularly when he leans over.  He was supposed to have had an abdominal aortic aneurysm follow-up Doppler study which had not yet been done.  He has not had recent laboratory.  He is and he continues to be in  longstanding persistent atrial fibrillation and is on Eliquis anticoagulation.  Currently for the past week he ran out of his amlodipine which had been 5 mg daily and he continues to take furosemide 20 mg in addition to irbesartan 150 mg daily for hypertension.  He is on Zetia 10 mg in addition to atorvastatin 40 mg for hyperlipidemia.  During that evaluation, I recommended he resume amlodipine with target blood pressure less than 130/80.  He was to undergo follow-up abdominal aortic aneurysm duplex imaging.  I saw him on June 24, 2022.  He continued to feel well.   In July and August, 2023 he had a longstanding bronchial infection and required antibiotic therapy for approximately 5 weeks.  He denies any chest pain.  At times he notes some balance issues.  He experiences occasional left lower extremity claudication symptomatology.  He will be following up with Dr. Chestine Spore.  He sees Dr. Kevan Ny for primary care.  He is no longer taking furosemide.  He continues to be in permanent atrial fibrillation with a controlled ventricular rate on Eliquis anticoagulation.  He underwent an echo Doppler study yesterday on November 04, 2022.  EF was normal at 55 to 60% without wall motion abnormalities.  Pulmonary artery systolic pressure was moderately elevated at 52.9 mmHg.  There was mild biatrial enlargement.  He had severe calcification of the aortic valve with mild aortic valve stenosis.  Mean gradient was 19 mmHg, peak gradient 31 mmHg with estimated valve area 1.63.  There is mild dilation of his ascending aorta at 38 mm.  I saw him on November 05, 2022 at which time he felt well.  He denies chest pain or shortness of breath.  He denies presyncope or syncope or palpitations.  His atrial fibrillation rate has been controlled.  He admits to some trace swelling.  He continues to play golf regularly.  He has PVD and is followed by Dr. Chestine Spore.    He was evaluated by Dr. Chestine Spore for his predominant left lower extremity  claudication on April 15, 2023.  At that time, medical therapy approach was recommended and Pletal 100 mg twice a day was added to his medical regimen.  He did not feel his claudication was disabling at this time and if intervention was necessary it would require a brachial approach which is high risk.  I last saw him on May 12, 2023 at which time he denied any chest pain.  He did note some fatigability. He has permanent atrial fibrillation and is on Eliquis 5 mg twice a day for anticoagulation.  He is on amlodipine 5 mg and irbesartan 150 mg for hypertension.  He is on atorvastatin 40 mg and Zetia 10 mg for hyperlipidemia.  He is on metformin 500 mg daily.  He underwent a follow-up chest CT in March 2024 which showed multiple pulmonary nodules most significant 4 mm solid pulmonary nodule in the upper lobe.  It was recommended to consider possible noncontrast chest CT in 1 year.  He has now establish primary care with Dr. Eleanora Neighbor since Dr. Johnella Moloney has retired.  During that evaluation, he was not having any anginal symptoms.  His  blood pressure was stable.  Laboratory in May 2024 showed LDL cholesterol at 62 on combination Zetia and atorvastatin 40 mg.  I recommended a 1 year follow-up echo Doppler study to reassess his aortic valve disease in February 2025 with follow-up office visit thereafter.  Recently, he was evaluated by Dr. Steffanie Grant on October 08, 2023.  He has longstanding persistent atrial fibrillation.  He apparently has noted continued fatigue.  He felt more fatigue if his heart rate dropped into the 40s.  There was discussion concerning the possibility of insertion of a permanent pacemaker for symptomatic bradycardia.  Mr. David underwent his 1 year follow-up echo Doppler study on November 03, 2023.  This continued to show normal systolic function with EF 60 to 65%.  There was mild concentric LVH.  There was moderate elevation of PA pressure at 53.7 mm.  There was mild  biatrial enlargement.  His aortic valve was calcified with and was now felt to have moderate aortic stenosis with a mean gradient of 27 and peak gradient at 43.8.  He presents now for follow-up evaluation.   Past Medical History:  Diagnosis Date   CAD (coronary artery disease), severe needing CABG 01/19/2013   Chest pain    2D ECHO, 06/16/2012 - EF 50-55%, borderline low left ventricular systolic function   Coronary artery disease    Dyspnea on exertion    LEXISCAN, 06/17/2012 - fixed inferior bowel artifact and basal septal defect, probably related to IVCD/incomplete LBBB, no reversible ischemia, post-stress EF 66%   History of AAA (abdominal aortic aneurysm) repair    ABDOMINAL AORTIC DOPPLER, 06/16/2013 - normal   HTN (hypertension) 01/19/2013   Hyperlipidemia LDL goal < 70 01/19/2013   Hypertension    S/P CABG x 3 01/19/2013    Past Surgical History:  Procedure Laterality Date   CARDIAC CATHETERIZATION  01/15/2013   Severe coronary obstructive disease with 60-70% ostial left main and 90-85% distal LAD stenosis and mild-moderate stenoses in RCA, recommended for CABG   CARDIOVERSION N/A 11/12/2013   Procedure: CARDIOVERSION;  Surgeon: Lennette Bihari, MD;  Location: [facility] ENDOSCOPY;  Service: Cardiovascular;  Laterality: N/A;  11:16 Lido 60mg , Propofol 120mg ,IV 11:19 synch 100 joules conversion conversion to NSR....   CARDIOVERSION N/A 05/13/2017   Procedure: CARDIOVERSION;  Surgeon: Elease Hashimoto Deloris Ping, MD;  Location: [facility] ENDOSCOPY;  Service: Cardiovascular;  Laterality: N/A;   CORONARY ARTERY BYPASS GRAFT N/A 01/15/2013   Procedure: Coronary Artery Bypass Graft times three using Right Greater Saphenous Vein Graft harvested endoscopically and Left Internal Mammary Artery and;  Surgeon: Alleen Borne, MD;  Location: [facility] OR;  Service: Open Heart Surgery;  Laterality: N/A;   INTRAOPERATIVE TRANSESOPHAGEAL ECHOCARDIOGRAM  01/15/2013   Procedure: INTRAOPERATIVE TRANSESOPHAGEAL ECHOCARDIOGRAM;  Surgeon: Alleen Borne, MD;  Location: [facility] OR;  Service: Open Heart Surgery;;   LEFT HEART CATHETERIZATION WITH CORONARY ANGIOGRAM N/A 01/15/2013   Procedure: LEFT HEART CATHETERIZATION WITH CORONARY ANGIOGRAM;  Surgeon: Lennette Bihari, MD;  Location[facility] C[facility] LAB;  Service: Cardiovascular;  Laterality: N/A;   VASCULAR SURGERY      Allergies  Allergen Reactions   Penicillin G     Other reaction(s): rash   Penicillins Rash    Has patient had a PCN reaction causing immediate rash, facial/tongue/throat swelling, SOB or lightheadedness with hypotension: no Has patient had a PCN reaction causing severe rash involving mucus membranes or skin necrosis: no Has patient had a PCN reaction that required hospitalization: no Has patient had a PCN reaction  occurring within the last 10 years: yes If all of the above answers are "NO", then may proceed with Cephalosporin use.     Current Outpatient Medications  Medication Sig Dispense Refill   amLODipine (NORVASC) 5 MG tablet TAKE 1 TABLET(5 MG) BY MOUTH DAILY 90 tablet 3   Ascorbic Acid (VITAMIN C) 1000 MG tablet Take 1,000 mg by mouth daily. 1 Tablet Daily     atorvastatin (LIPITOR) 40 MG tablet TAKE 1 TABLET BY MOUTH EVERY DAY 90 tablet 2   cholecalciferol (VITAMIN D3) 25 MCG (1000 UNIT) tablet Take 1,000 Units by mouth daily. 1 Tablet Daily     cilostazol (PLETAL) 100 MG tablet Take 1 tablet (100 mg total) by mouth 2 (two) times daily before a meal. 60 tablet 11   COVID-19 mRNA bivalent vaccine, Pfizer, (PFIZER COVID-19 VAC BIVALENT) injection Inject into the muscle. 0.3 mL 0   COVID-19 mRNA vaccine 2023-2024 (COMIRNATY) syringe Inject into the muscle. 0.3 mL 0   COVID-19 mRNA vaccine, Pfizer, (COMIRNATY) syringe Inject into the muscle. 0.3 mL 0   ELIQUIS 5 MG TABS tablet TAKE 1 TABLET BY MOUTH TWICE DAILY 180 tablet 1   ezetimibe (ZETIA) 10 MG tablet TAKE 1 TABLET(10 MG) BY MOUTH DAILY 90 tablet 2   influenza vaccine adjuvanted (FLUAD QUADRIVALENT) 0.5 ML  injection Inject into the muscle. 0.5 mL 0   irbesartan (AVAPRO) 150 MG tablet TAKE 1 TABLET BY MOUTH DAILY 90 tablet 3   latanoprost (XALATAN) 0.005 % ophthalmic solution 1 drop at bedtime.     metFORMIN (GLUCOPHAGE) 500 MG tablet Take 500 mg by mouth daily.     No current facility-administered medications for this visit.    Socially he is re-married. His first wife of 42 years passed away. He previously had smoked cigars prior to his bypass surgery but has not had any tobacco use since.  He continues to play golf at least 2 times per week.  He does a lot of leg exercise with cardiac rehabilitation.  ROS General: ; No fevers, chills, or night sweats; positive for fatigability HEENT: Negative; No changes in vision or hearing, sinus congestion, difficulty swallowing Pulmonary: URI symptoms during the winter; recent prolonged antibiotics for bronchial infection Cardiovascular: Mild shortness of breath with uphill walking; see history of present illness;  left calf claudication with high-grade proximal right SFA stenosis and left mid SFA stenoses. GI: Negative; No nausea, vomiting, diarrhea, or abdominal pain GU: Negative; No dysuria, hematuria, or difficulty voiding Musculoskeletal: Negative; no myalgias, joint pain, or weakness Hematologic/Oncology: Negative; no easy bruising, bleeding Endocrine: Negative; no heat/cold intolerance; no diabetes Neuro: Negative; no changes in balance, headaches Skin: Removal of small left forearm melanoma. Psychiatric: Negative; No behavioral problems, depression Sleep: Negative; No snoring, daytime sleepiness, hypersomnolence, bruxism, restless legs, hypnogognic hallucinations, no cataplexy Other comprehensive 14 point system review is negative.   PE BP (!) 148/52 (BP Location: Left Arm, Patient Position: Sitting, Cuff Size: Large)   Pulse (!) 50   Ht 6\' 1"  (1.854 m)   Wt 231 lb 6.4 oz (105 kg)   BMI 30.53 kg/m    Repeat blood pressure by me was  130/62  Wt Readings from Last 3 Encounters:  11/05/23 231 lb 6.4 oz (105 kg)  10/08/23 232 lb 6.4 oz (105.4 kg)  08/26/23 230 lb (104.3 kg)   General: Alert, oriented, no distress.  Skin: normal turgor, no rashes, warm and dry HEENT: Normocephalic, atraumatic. Pupils equal round and reactive to light; sclera anicteric; extraocular  muscles intact;  Nose without nasal septal hypertrophy Mouth/Parynx benign; Mallinpatti scale 3 Neck: No JVD, no carotid bruits; normal carotid upstroke Lungs: clear to ausculatation and percussion; no wheezing or rales Chest wall: without tenderness to palpitation Heart: PMI not displaced, irregular irregular with ventricular rate 50s consistent with atrial fibrillation, s1 s2 normal, 2/6 systolic murmur, no diastolic murmur, no rubs, gallops, thrills, or heaves Abdomen: soft, nontender; no hepatosplenomehaly, BS+; abdominal aorta nontender and not dilated by palpation. Back: no CVA tenderness Pulses 2+ right lower extremity, slightly decreased left lower extremity.  Musculoskeletal: full range of motion, normal strength, no joint deformities Extremities:Trivial right lower extremity edema no clubbing cyanosis, Homan's sign negative  Neurologic: grossly nonfocal; Cranial nerves grossly wnl Psychologic: Normal mood and affect    EKG Interpretation Date/Time:  Wednesday November 05 2023 12:08:19 EST Ventricular Rate:  50 PR Interval:    QRS Duration:  130 QT Interval:  480 QTC Calculation: 437 R Axis:   -66  Text Interpretation: Atrial fibrillation with slow ventricular response Left axis deviation Left bundle branch block When compared with ECG of 08-Oct-2023 16:54, No significant change was found Confirmed by Nicki Guadalajara ([id number]) on 11/05/2023 1:04:27 PM   EKG Interpretation Date/Time:  Wednesday November 05 2023 12:08:19 EST Ventricular Rate:  50 PR Interval:    QRS Duration:  130 QT Interval:  480 QTC Calculation: 437 R Axis:   -66  Text  Interpretation: Atrial fibrillation with slow ventricular response Left axis deviation Left bundle branch block When compared with ECG of 08-Oct-2023 16:54, No significant change was found Confirmed by Nicki Guadalajara ([id number]) on 11/05/2023 1:04:27 PM November 05, 2022 ECG (independently read by me): Atrial fibrillation at 56, LBBB   June 24, 2022 ECG (independently read by me): Atrial fibrillation at 52, Left axis deviation  Jan 29, 2022 ECG (independently read by me): Atrial fibrillation at 57; Right axis deviation, Nonspecific IVCD   September 27, 2021 ECG (independently read by me):  Atrial fibrillation at 53, LBBB  March 19, 2021 ECG (independently read by me): Atrial fibrillation at 47; LAD, QTc 430 msec  October 2021 ECG (independently read by me): Atrial fibrillation at 54; PRWP; QTc 428 bmsec  April 2021 ECG (independently read by me): Atrial fibrillation with an average ventricular rate of 53 bpm with a range of in the 60s to 40s.  One isolated PVC.  Nonspecific interventricular block.  July 07, 2020 ECG (independently read by me): Sinus bradycardia with first-degree AV block.  PR interval 392 ms.  Incomplete left bundle branch block.  May 29, 2018 ECG (independently read by me): Sinus rhythm with first-degree AV block at 72 bpm with a PR interval of 312 ms.  Occasional PACs.  February 2019 ECG (independently read by me): Sinus rhythm at 63 bpm.  First-degree AV block with a PR interval at 344 ms.  PAC.  Nonspecific interventricular conduction delay.  October 2018 ECG (independently read by me): Sinus rhythm with first-degree AV block with a PR interval of 314 ms.  Nonspecific interventricular block.  QTc interval 431  January 2018 ECG (independently read by me): Normal sinus rhythm with first-degree AV block.  PR interval 300 ms.  Nonspecific interventricular conduction delay.  October 2017 ECG (independently read by me): Normal sinus rhythm at 66 bpm.  First-degree block with a  PR interval at 290 ms.  Nonspecific interventricular block.  03/02/2015 ECG (independently read by me): Sinus rhythm at 75 bpm.  Occasional unifocal PVCs.  Nonspecific interventricular block.  Prior October 2015 ECG (independently read by me): Normal sinus rhythm at 67 beats per minute.  First degree A-V block with PR interval at 290 ms.  QTc interval 439 ms.  No ST segment changes.  Prior June 2015 ECG (independently read by me): Normal sinus rhythm at 68 beats per minute with first degree AV block with a PR interval at 284 ms.  QTc interval 435 ms.  No significant ST-T changes.  T-wave inversion in aVL  ECG today (independently read by me):  Normal sinus rhythm at 71 beats per minute. First create a block with PR interval at 240 ms. QTc interval normal at 4-5 ms.  Prior 10/27/13 ECG (independently read by me): Atrial flutter with variable rate but predominantly 4-1 with occasional 3-1 block; average heart rate 70 beats per minute  Prior ECG from 06/04/2013: Sinus rhythm with first-degree AV block. Nonspecific interventricular block  LABS:    Latest Ref Rng & Units 06/19/2022   10:17 AM 11/08/2021    9:53 AM 02/01/2021    3:47 PM  BMP  Glucose 70 - 99 mg/dL 97  93  95   BUN 8 - 27 mg/dL 16  14  20    Creatinine 0.76 - 1.27 mg/dL 6.57  8.46  9.62   BUN/Creat Ratio 10 - 24 18  15  19    Sodium 134 - 144 mmol/L 141  140  140   Potassium 3.5 - 5.2 mmol/L 4.6  4.9  4.7   Chloride 96 - 106 mmol/L 104  103  103   CO2 20 - 29 mmol/L 22  23  24    Calcium 8.6 - 10.2 mg/dL 9.5  9.4  9.9       Latest Ref Rng & Units 06/19/2022   10:17 AM 11/08/2021    9:53 AM 02/01/2021    3:47 PM  Hepatic Function  Total Protein 6.0 - 8.5 g/dL 7.1  6.8  7.1   Albumin 3.7 - 4.7 g/dL 4.6  4.4  4.4   AST 0 - 40 IU/L 19  18  15    ALT 0 - 44 IU/L 17  18  18    Alk Phosphatase 44 - 121 IU/L 99  77  83   Total Bilirubin 0.0 - 1.2 mg/dL 1.2  1.1  0.9       Latest Ref Rng & Units 11/08/2021    9:53 AM 03/19/2021    12:00 PM 01/10/2020   10:35 AM  CBC  WBC 3.4 - 10.8 x10E3/uL 9.1  9.0  7.4   Hemoglobin 13.0 - 17.7 g/dL 95.2  84.1  32.4   Hematocrit 37.5 - 51.0 % 39.9  40.7  41.6   Platelets 150 - 450 x10E3/uL 248  259  228    Lab Results  Component Value Date   MCV 81 11/08/2021   MCV 83 03/19/2021   MCV 87 01/10/2020   Lab Results  Component Value Date   TSH 1.970 11/08/2021   Lipid Panel     Component Value Date/Time   CHOL 113 06/19/2022 1017   CHOL 111 03/01/2015 1033   TRIG 74 06/19/2022 1017   TRIG 90 03/01/2015 1033   HDL 39 (L) 06/19/2022 1017   HDL 41 03/01/2015 1033   CHOLHDL 2.9 06/19/2022 1017   CHOLHDL 4.1 07/19/2016 0949   VLDL 24 07/19/2016 0949   LDLCALC 59 06/19/2022 1017   LDLCALC 52 03/01/2015 1033     RADIOLOGY:  ECHO: 11/04/2022  1. Left ventricular ejection  fraction, by estimation, is 55 to 60%. The  left ventricle has normal function. The left ventricle has no regional  wall motion abnormalities. Left ventricular diastolic parameters are  indeterminate.   2. Right ventricular systolic function is mildly reduced. The right  ventricular size is moderately enlarged. There is moderately elevated  pulmonary artery systolic pressure. The estimated right ventricular  systolic pressure is 52.9 mmHg.   3. Left atrial size was mildly dilated.   4. Right atrial size was mildly dilated.   5. The mitral valve is normal in structure. Trivial mitral valve  regurgitation. No evidence of mitral stenosis. Moderate mitral annular  calcification.   6. The aortic valve is tricuspid. There is severe calcifcation of the  aortic valve. Aortic valve regurgitation is trivial. Mild aortic valve  stenosis. Aortic valve area, by VTI measures 1.63 cm. Aortic valve mean  gradient measures 19.0 mmHg.   7. Aortic dilatation noted. There is mild dilatation of the ascending  aorta, measuring 38 mm.   8. The inferior vena cava is dilated in size with >50% respiratory  variability,  suggesting right atrial pressure of 8 mmHg.   9. The patient was in atrial fibrillation.    ECHO: 11/03/2023  1. Left ventricular ejection fraction, by estimation, is 60 to 65%. Left  ventricular ejection fraction by 3D volume is 60 %. The left ventricle has  normal function. The left ventricle has no regional wall motion  abnormalities. There is mild concentric  left ventricular hypertrophy. Left ventricular diastolic parameters are  indeterminate.   2. Right ventricular systolic function is low normal. The right  ventricular size is severely enlarged. There is moderately elevated  pulmonary artery systolic pressure. The estimated right ventricular  systolic pressure is 53.7 mmHg.   3. Left atrial size was mildly dilated.   4. Right atrial size was mildly dilated.   5. The mitral valve is degenerative. Trivial mitral valve regurgitation.  No evidence of mitral stenosis.   6. DVI 0.33. The aortic valve is calcified. Aortic valve regurgitation is  trivial. Moderate aortic valve stenosis. Aortic valve area, by VTI  measures 1.14 cm. Aortic valve mean gradient measures 27.0 mmHg. Aortic  valve Vmax measures 3.31 m/s.   7. The inferior vena cava is dilated in size with >50% respiratory  variability, suggesting right atrial pressure of 8 mmHg.   Comparison(s): Prior images reviewed side by side. Aortic valve gradients  have increased.    IMPRESSION:  1. Coronary artery disease involving native coronary artery of native heart without angina pectoris   2. Hx of CABG   3. Nonrheumatic moderate aortic valve stenosis   4. Permanent atrial fibrillation (HCC)   5. Bradycardia   6. S/P AAA repair   7. Essential hypertension   8. Hyperlipidemia LDL goal <70   9. Peripheral arterial disease (HCC)   10. Lung nodules   11. Anticoagulated by anticoagulation treatment   12. Type 2 diabetes mellitus with other circulatory complication, without long-term current use of insulin [facility])      ASSESSMENT AND PLAN: Mr. Walston is an active 89 year old white male who underwent emergent CABG revascularization surgery after cardiac catheterization on 01/15/2013 demonstrated severe life threatening coronary anatomy.  He developed atrial flutter in January 2015 and was started on anticoagulation with Xarelto and underwent cardioversion.  He developed recurrent episodes of atrial fibrillation in 2018 and had maintained sinus rhythm since his repeat cardioversion on 05/13/2017 but developed recurrent atrial flutter 1 year later in May 13, 2018 was found to have a flutter with controlled rate.  When seen in October 2019 he was maintaining sinus rhythm.  However, subsequently he has developed longstanding persistent atrial fibrillation and has been maintained on Eliquis for anticoagulation.  An echo Doppler study in 2019 showed EF 60 to 65% with mean aortic gradient 14 consistent with mild aortic stenosis and grade 2 diastolic dysfunction.  In May 2020 aortic stenosis remains mild with mean gradient of 13.8 and peak gradient of 22.8.  There was mild pulmonary hypertension with PA systolic pressure of 36.5.  His echo in April 2022 showed a mean gradient of 14.  His echo from February 2022  showed a mean gradient of 20 and peak aortic gradient of 35 mm.  There was severe left atrial dilatation and moderate right atrial dilatation.  His echo in February 2023 showed EF 60 to 65% with moderate aortic stenosis, mean gradient 20 peak gradient 35 mmHg.  His echo on November 04, 2022  showed an EF of 55 to 60%.  There was moderately increased PA pressure at 52.9 mmHg.  Aortic stenosis remained mild with a mean gradient of 19 and peak gradient of 31 mmHg with estimated valve area at 1.63 cm.  His recent abdominal aortic ultrasound was stable with abdominal aorta at 2.6 cm with no thrombus in the IVC.  He underwent follow-up lower extremity Doppler study ordered by Dr. Sherald Hess which showed right ABI 0.85 and  noncompressible left.  Was seen by Dr. Chestine Spore he did not recommend invasive intervention at this time.  He had a palpable PT pulse in the left ankle.  It was recommended to start Pletal 100 mg twice a day for at least 6 weeks to see if any improvement.  Plan is for him to follow-up with Dr. Chestine Spore in 3 months.  He did not feel his claudication was markedly disabling and if intervention was required it would require a brachial approach with high risk.  Presently, Mr. Wanke denies any recurrent anginal type symptomatology.  He has noted more fatigue in the past which may be contributed by a combination of his bradycardia, atrial fibrillation, as well as progressive aortic valve stenosis.  His blood pressure today is stable on his current regimen of amlodipine 5 mg, irbesartan 150 mg daily.  ECG shows atrial fibrillation with ventricular rate at 50 bpm.  He has been on atorvastatin 40 mg and Zetia 10 mg for hyperlipidemia.  He has not had recent laboratory.  I am recommending comprehensive fasting labs with a c-Met, CBC, TSH, lipid panel and LP(a).  I reviewed his most recent 2D echo Doppler study from November 03, 2023.  His aortic stenosis has progressed and is now moderate with a mean gradient at 27 (increased from 19 and a peak gradient now at 43.8, increased from 31.  Aortic valve area now was 1.14 cm.  CT of his chest 1 year ago did raise concern for pulmonary nodules and it was recommended that in 1 year he undergo a noncontrast chest CT for reassessment.  I discussed my plans for upcoming retirement at the end of June 2025.  He had seen Dr. Lalla Brothers for consideration of possible permanent pacemaker but is uncertain as to proceed with this.  I have recommended that he undergo a repeat cardiac echo in August 2025.  I will transition him to the care of Dr. Tonny Bollman in light of his aortic stenosis due to potential need for future TAVR.  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   Lennette Bihari, MD, [facility]  11/05/2023 5:01 PM

## 2023-11-10 NOTE — Progress Notes (Signed)
Patient say results via mychart

## 2023-11-11 ENCOUNTER — Other Ambulatory Visit (HOSPITAL_COMMUNITY): Payer: Medicare Other

## 2023-11-20 DIAGNOSIS — I2510 Atherosclerotic heart disease of native coronary artery without angina pectoris: Secondary | ICD-10-CM | POA: Diagnosis not present

## 2023-11-20 DIAGNOSIS — E785 Hyperlipidemia, unspecified: Secondary | ICD-10-CM | POA: Diagnosis not present

## 2023-11-20 DIAGNOSIS — I350 Nonrheumatic aortic (valve) stenosis: Secondary | ICD-10-CM | POA: Diagnosis not present

## 2023-11-20 DIAGNOSIS — I10 Essential (primary) hypertension: Secondary | ICD-10-CM | POA: Diagnosis not present

## 2023-11-20 DIAGNOSIS — R001 Bradycardia, unspecified: Secondary | ICD-10-CM | POA: Diagnosis not present

## 2023-11-20 DIAGNOSIS — Z9889 Other specified postprocedural states: Secondary | ICD-10-CM | POA: Diagnosis not present

## 2023-11-20 LAB — CBC

## 2023-11-22 LAB — COMPREHENSIVE METABOLIC PANEL
ALT: 17 IU/L (ref 0–44)
AST: 19 IU/L (ref 0–40)
Albumin: 4.6 g/dL (ref 3.7–4.7)
Alkaline Phosphatase: 104 IU/L (ref 44–121)
BUN/Creatinine Ratio: 19 (ref 10–24)
BUN: 17 mg/dL (ref 8–27)
Bilirubin Total: 1.3 mg/dL — ABNORMAL HIGH (ref 0.0–1.2)
CO2: 21 mmol/L (ref 20–29)
Calcium: 9.4 mg/dL (ref 8.6–10.2)
Chloride: 104 mmol/L (ref 96–106)
Creatinine, Ser: 0.91 mg/dL (ref 0.76–1.27)
Globulin, Total: 2.4 g/dL (ref 1.5–4.5)
Glucose: 97 mg/dL (ref 70–99)
Potassium: 4.5 mmol/L (ref 3.5–5.2)
Sodium: 140 mmol/L (ref 134–144)
Total Protein: 7 g/dL (ref 6.0–8.5)
eGFR: 81 mL/min/{1.73_m2} (ref >59)

## 2023-11-22 LAB — LIPID PANEL
Chol/HDL Ratio: 3.1 ratio (ref 0.0–5.0)
Cholesterol, Total: 119 mg/dL (ref 100–199)
HDL: 39 mg/dL — ABNORMAL LOW (ref >39)
LDL Chol Calc (NIH): 64 mg/dL (ref 0–99)
Triglycerides: 83 mg/dL (ref 0–149)
VLDL Cholesterol Cal: 16 mg/dL (ref 5–40)

## 2023-11-22 LAB — CBC
Hematocrit: 41.9 % (ref 37.5–51.0)
Hemoglobin: 13.9 g/dL (ref 13.0–17.7)
MCH: 30.7 pg (ref 26.6–33.0)
MCHC: 33.2 g/dL (ref 31.5–35.7)
MCV: 93 fL (ref 79–97)
Platelets: 249 10*3/uL (ref 150–450)
RBC: 4.53 x10E6/uL (ref 4.14–5.80)
RDW: 14.3 % (ref 11.6–15.4)
WBC: 7 10*3/uL (ref 3.4–10.8)

## 2023-11-22 LAB — TSH: TSH: 2.73 u[IU]/mL (ref 0.450–4.500)

## 2023-11-22 LAB — LIPOPROTEIN A (LPA): Lipoprotein (a): 15.6 nmol/L (ref <75.0)

## 2023-11-26 ENCOUNTER — Other Ambulatory Visit (HOSPITAL_COMMUNITY): Payer: Medicare Other

## 2023-11-28 ENCOUNTER — Ambulatory Visit (HOSPITAL_COMMUNITY)
Admission: RE | Admit: 2023-11-28 | Discharge: 2023-11-28 | Disposition: A | Discharge status: Home / Self Care | Payer: Medicare Other | Source: Ambulatory Visit | Attending: Cardiovascular Disease | Admitting: Cardiovascular Disease

## 2023-11-28 DIAGNOSIS — I350 Nonrheumatic aortic (valve) stenosis: Secondary | ICD-10-CM | POA: Diagnosis not present

## 2023-11-28 DIAGNOSIS — J439 Emphysema, unspecified: Secondary | ICD-10-CM | POA: Diagnosis not present

## 2023-11-28 DIAGNOSIS — I700 Atherosclerosis of aorta: Secondary | ICD-10-CM | POA: Diagnosis not present

## 2023-12-15 ENCOUNTER — Other Ambulatory Visit: Payer: Self-pay | Admitting: Cardiovascular Disease

## 2024-01-14 DIAGNOSIS — H401131 Primary open-angle glaucoma, bilateral, mild stage: Secondary | ICD-10-CM | POA: Diagnosis not present

## 2024-01-15 ENCOUNTER — Ambulatory Visit: Payer: Medicare Other | Admitting: Cardiovascular Disease

## 2024-01-30 DIAGNOSIS — Z79899 Other long term (current) drug therapy: Secondary | ICD-10-CM | POA: Diagnosis not present

## 2024-01-30 DIAGNOSIS — E119 Type 2 diabetes mellitus without complications: Secondary | ICD-10-CM | POA: Diagnosis not present

## 2024-01-30 DIAGNOSIS — I25119 Atherosclerotic heart disease of native coronary artery with unspecified angina pectoris: Secondary | ICD-10-CM | POA: Diagnosis not present

## 2024-01-30 DIAGNOSIS — Z1331 Encounter for screening for depression: Secondary | ICD-10-CM | POA: Diagnosis not present

## 2024-01-30 DIAGNOSIS — E559 Vitamin D deficiency, unspecified: Secondary | ICD-10-CM | POA: Diagnosis not present

## 2024-01-30 DIAGNOSIS — E538 Deficiency of other specified B group vitamins: Secondary | ICD-10-CM | POA: Diagnosis not present

## 2024-01-30 DIAGNOSIS — I10 Essential (primary) hypertension: Secondary | ICD-10-CM | POA: Diagnosis not present

## 2024-01-30 DIAGNOSIS — Z0000 Encounter for general adult medical examination without abnormal findings: Secondary | ICD-10-CM | POA: Diagnosis not present

## 2024-01-30 DIAGNOSIS — I4892 Unspecified atrial flutter: Secondary | ICD-10-CM | POA: Diagnosis not present

## 2024-02-04 DIAGNOSIS — E519 Thiamine deficiency, unspecified: Secondary | ICD-10-CM | POA: Diagnosis not present

## 2024-02-11 DIAGNOSIS — E538 Deficiency of other specified B group vitamins: Secondary | ICD-10-CM | POA: Diagnosis not present

## 2024-02-15 ENCOUNTER — Other Ambulatory Visit: Payer: Self-pay | Admitting: Cardiovascular Disease

## 2024-02-18 DIAGNOSIS — E538 Deficiency of other specified B group vitamins: Secondary | ICD-10-CM | POA: Diagnosis not present

## 2024-02-23 DIAGNOSIS — K080 Exfoliation of teeth due to systemic causes: Secondary | ICD-10-CM | POA: Diagnosis not present

## 2024-03-01 DIAGNOSIS — E538 Deficiency of other specified B group vitamins: Secondary | ICD-10-CM | POA: Diagnosis not present

## 2024-03-18 DIAGNOSIS — K080 Exfoliation of teeth due to systemic causes: Secondary | ICD-10-CM | POA: Diagnosis not present

## 2024-03-23 ENCOUNTER — Other Ambulatory Visit (HOSPITAL_COMMUNITY): Payer: Self-pay | Admitting: Cardiovascular Disease

## 2024-03-23 DIAGNOSIS — K080 Exfoliation of teeth due to systemic causes: Secondary | ICD-10-CM | POA: Diagnosis not present

## 2024-03-23 DIAGNOSIS — I4819 Other persistent atrial fibrillation: Secondary | ICD-10-CM

## 2024-03-23 NOTE — Telephone Encounter (Signed)
 Prescription refill request for Eliquis  received. Indication: AF Last office visit: 11/05/23  ONEIDA Sor MD Scr: 0.91 on 11/20/23  CHRISTELLA Fell MD Age: 89 Weight: 105kg  Based on above findings Eliquis  5mg  twice daily is the appropriate dose.  Refill approved.

## 2024-04-02 DIAGNOSIS — H401131 Primary open-angle glaucoma, bilateral, mild stage: Secondary | ICD-10-CM | POA: Diagnosis not present

## 2024-04-26 DIAGNOSIS — I10 Essential (primary) hypertension: Secondary | ICD-10-CM | POA: Diagnosis not present

## 2024-04-26 DIAGNOSIS — D6869 Other thrombophilia: Secondary | ICD-10-CM | POA: Diagnosis not present

## 2024-04-26 DIAGNOSIS — I25119 Atherosclerotic heart disease of native coronary artery with unspecified angina pectoris: Secondary | ICD-10-CM | POA: Diagnosis not present

## 2024-04-26 DIAGNOSIS — I4892 Unspecified atrial flutter: Secondary | ICD-10-CM | POA: Diagnosis not present

## 2024-05-03 ENCOUNTER — Other Ambulatory Visit: Payer: Self-pay | Admitting: *Deleted

## 2024-05-03 MED ORDER — EZETIMIBE 10 MG PO TABS: 10.0000 mg | ORAL_TABLET | Freq: Every day | ORAL | 0 refills | Status: DC

## 2024-05-05 ENCOUNTER — Ambulatory Visit (HOSPITAL_COMMUNITY): Payer: Medicare Other

## 2024-05-18 DIAGNOSIS — Z08 Encounter for follow-up examination after completed treatment for malignant neoplasm: Secondary | ICD-10-CM | POA: Diagnosis not present

## 2024-05-18 DIAGNOSIS — D225 Melanocytic nevi of trunk: Secondary | ICD-10-CM | POA: Diagnosis not present

## 2024-05-18 DIAGNOSIS — L821 Other seborrheic keratosis: Secondary | ICD-10-CM | POA: Diagnosis not present

## 2024-05-18 DIAGNOSIS — L814 Other melanin hyperpigmentation: Secondary | ICD-10-CM | POA: Diagnosis not present

## 2024-05-19 ENCOUNTER — Encounter: Payer: Self-pay | Admitting: Cardiovascular Disease

## 2024-05-19 ENCOUNTER — Ambulatory Visit: Attending: Cardiovascular Disease | Admitting: Cardiovascular Disease

## 2024-05-19 VITALS — BP 120/50 | HR 53 | Ht 73.0 in | Wt 222.6 lb

## 2024-05-19 DIAGNOSIS — I4819 Other persistent atrial fibrillation: Secondary | ICD-10-CM | POA: Diagnosis not present

## 2024-05-19 DIAGNOSIS — I739 Peripheral vascular disease, unspecified: Secondary | ICD-10-CM | POA: Diagnosis not present

## 2024-05-19 DIAGNOSIS — I2510 Atherosclerotic heart disease of native coronary artery without angina pectoris: Secondary | ICD-10-CM

## 2024-05-19 DIAGNOSIS — I350 Nonrheumatic aortic (valve) stenosis: Secondary | ICD-10-CM

## 2024-05-19 DIAGNOSIS — E785 Hyperlipidemia, unspecified: Secondary | ICD-10-CM

## 2024-05-19 NOTE — Assessment & Plan Note (Signed)
 Stable symptoms of claudication reported.  Notes of Dr. Gretta reviewed with plans for medical therapy.  No evidence of resting ischemia or ischemic ulceration.

## 2024-05-19 NOTE — Assessment & Plan Note (Signed)
 Patient with slow ventricular response and left bundle branch morphology.  Discussed with him how his symptoms correlate with bradycardia.  Also discussed the natural history of aortic stenosis and his likely need for TAVR in the next few years.  I think considering his clinical picture, clear evidence of symptomatic bradycardia with slow atrial fibrillation, and context of aortic valve disease, permanent pacing is indicated and appropriate to improve his quality of life.  He would like to schedule pacemaker implantation with Dr. Cindie and I will copy him on this note.  He remains on apixaban .

## 2024-05-19 NOTE — Progress Notes (Signed)
 Cardiology Office Note:    Date:  05/19/2024   ID:  Lynwood DELENA Bring, DOB [birth year], MRN [id number]  PCP:  Charlott Dorn DELENA, MD   [facility] Providers Cardiologist:  Ozell Fell, MD Electrophysiologist:  OLE ONEIDA HOLTS, MD     Referring MD: Charlott Dorn DELENA, *   Chief Complaint  Patient presents with   Atrial Fibrillation    History of Present Illness:    Derek Wells is a 89 y.o. male presenting for follow-up evaluation. He has been previously followed by Dr Burnard. The patient has a hx of CAD s/p CABG, AAA repair, lower extremity PAD, permanent atrial fibrillation, and moderate aortic stenosis.   The patient is here with his wife today.  I have reviewed notes from Dr. Burnard, Dr. HOLTS, and Dr. Gretta regarding his cardiovascular disease.  The patient clearly reports that he has worsening fatigue at times of slow heart rates.  He has seen Dr. HOLTS has recommended a permanent pacemaker and the patient has been trying to make a decision about this.  We had lengthy discussion about it today.  He does not have presyncope or frank syncope.  He has shortness of breath with activity that has been stable over the past year.  No orthopnea, PND, or chest pressure.  He is compliant with his medicines and denies any significant bleeding history on apixaban .  He does have some functional limitation from calf claudication and lower extremity weakness.  He still is able to play 18 holes of golf as long as he rides in a cart.  He has been dealing with right-sided chest and scapular pain related to shingles and this is now improving.  Current Medications: Current Meds  Medication Sig   amLODipine  (NORVASC ) 5 MG tablet TAKE 1 TABLET(5 MG) BY MOUTH DAILY (Patient taking differently: Take 5 mg by mouth daily. TAKE 1 TABLET(5 MG) BY MOUTH DAILY)   Ascorbic Acid (VITAMIN C) 1000 MG tablet Take 1,000 mg by mouth daily. 1 Tablet Daily   atorvastatin  (LIPITOR) 40 MG tablet TAKE 1  TABLET BY MOUTH EVERY DAY   cholecalciferol (VITAMIN D3) 25 MCG (1000 UNIT) tablet Take 1,000 Units by mouth daily. 1 Tablet Daily   ELIQUIS  5 MG TABS tablet TAKE 1 TABLET BY MOUTH TWICE DAILY   ezetimibe  (ZETIA ) 10 MG tablet Take 1 tablet (10 mg total) by mouth daily.   gabapentin (NEURONTIN) 100 MG capsule Take 100 mg by mouth as needed.   irbesartan  (AVAPRO ) 150 MG tablet TAKE 1 TABLET BY MOUTH DAILY   latanoprost (XALATAN) 0.005 % ophthalmic solution 1 drop at bedtime.   metFORMIN (GLUCOPHAGE) 500 MG tablet Take 500 mg by mouth daily.   timolol (TIMOPTIC) 0.5 % ophthalmic solution Place 1 drop into both eyes.   valACYclovir (VALTREX) 1000 MG tablet 1,000 mg. Shingles     Allergies:   Penicillin g and Penicillins   ROS:   Please see the history of present illness.    All other systems reviewed and are negative.  EKGs/Labs/Other Studies Reviewed:    The following studies were reviewed today: Cardiac Studies & Procedures   ______________________________________________________________________________________________   STRESS TESTS  MYOCARDIAL PERFUSION IMAGING 07/23/2016  Interpretation Summary  The left ventricular ejection fraction is normal (55-65%).  Nuclear stress EF: 62%. No wall motion abnormalities  There was no ST segment deviation noted during stress. Rare PVC  This is a low risk study. No perfusion defects, no ischemia  Oneil Parchment, MD   ECHOCARDIOGRAM  ECHOCARDIOGRAM COMPLETE 11/03/2023  Narrative ECHOCARDIOGRAM REPORT    Patient Name:   KANOA PHILLIPPI  Date of Exam: 11/03/2023 Medical Rec #:  [id number]     Height:       73.0 in Accession #:    [id number]    Weight:       232.4 lb Date of Birth:  [birth year]     BSA:          2.293 m Patient Age:    89 years      BP:           128/68 mmHg Patient Gender: M             HR:           56 bpm. Exam Location:  [address]  Procedure: 2D Echo, Color Doppler, Cardiac Doppler and 3D Echo  Indications:     CAD I25.10  History:        Patient has prior history of Echocardiogram examinations, most recent 11/04/2022. Prior CABG; Risk Factors:Hypertension and Dyslipidemia.  Sonographer:    Augustin Seals RDCS Referring Phys: [phone] THOMAS A KELLY  IMPRESSIONS   1. Left ventricular ejection fraction, by estimation, is 60 to 65%. Left ventricular ejection fraction by 3D volume is 60 %. The left ventricle has normal function. The left ventricle has no regional wall motion abnormalities. There is mild concentric left ventricular hypertrophy. Left ventricular diastolic parameters are indeterminate. 2. Right ventricular systolic function is low normal. The right ventricular size is severely enlarged. There is moderately elevated pulmonary artery systolic pressure. The estimated right ventricular systolic pressure is 53.7 mmHg. 3. Left atrial size was mildly dilated. 4. Right atrial size was mildly dilated. 5. The mitral valve is degenerative. Trivial mitral valve regurgitation. No evidence of mitral stenosis. 6. DVI 0.33. The aortic valve is calcified. Aortic valve regurgitation is trivial. Moderate aortic valve stenosis. Aortic valve area, by VTI measures 1.14 cm. Aortic valve mean gradient measures 27.0 mmHg. Aortic valve Vmax measures 3.31 m/s. 7. The inferior vena cava is dilated in size with >50% respiratory variability, suggesting right atrial pressure of 8 mmHg.  Comparison(s): Prior images reviewed side by side. Aortic valve gradients have increased.  FINDINGS Left Ventricle: Left ventricular ejection fraction, by estimation, is 60 to 65%. Left ventricular ejection fraction by 3D volume is 60 %. The left ventricle has normal function. The left ventricle has no regional wall motion abnormalities. The left ventricular internal cavity size was normal in size. There is mild concentric left ventricular hypertrophy. Left ventricular diastolic parameters are indeterminate.  Right Ventricle: The  right ventricular size is severely enlarged. No increase in right ventricular wall thickness. Right ventricular systolic function is low normal. There is moderately elevated pulmonary artery systolic pressure. The tricuspid regurgitant velocity is 3.38 m/s, and with an assumed right atrial pressure of 8 mmHg, the estimated right ventricular systolic pressure is 53.7 mmHg.  Left Atrium: Left atrial size was mildly dilated.  Right Atrium: Right atrial size was mildly dilated.  Pericardium: There is no evidence of pericardial effusion.  Mitral Valve: The mitral valve is degenerative in appearance. Trivial mitral valve regurgitation. No evidence of mitral valve stenosis.  Tricuspid Valve: The tricuspid valve is normal in structure. Tricuspid valve regurgitation is mild . No evidence of tricuspid stenosis.  Aortic Valve: DVI 0.33. The aortic valve is calcified. Aortic valve regurgitation is trivial. Moderate aortic stenosis is present. Aortic valve mean gradient measures 27.0 mmHg. Aortic valve peak gradient  measures 43.8 mmHg. Aortic valve area, by VTI measures 1.14 cm.  Pulmonic Valve: The pulmonic valve was not well visualized. Pulmonic valve regurgitation is not visualized. No evidence of pulmonic stenosis.  Aorta: The aortic root and ascending aorta are structurally normal, with no evidence of dilitation.  Venous: The inferior vena cava is dilated in size with greater than 50% respiratory variability, suggesting right atrial pressure of 8 mmHg.  IAS/Shunts: The atrial septum is grossly normal.   LEFT VENTRICLE PLAX 2D LVIDd:         5.20 cm LVIDs:         3.20 cm LV PW:         1.10 cm         3D Volume EF LV IVS:        1.10 cm         LV 3D EF:    Left LVOT diam:     2.10 cm                      ventricul LV SV:         100                          ar LV SV Index:   43                           ejection LVOT Area:     3.46 cm                     fraction by 3D volume is 60  %.  3D Volume EF: 3D EF:        60 % LV EDV:       183 ml LV ESV:       73 ml LV SV:        111 ml  RIGHT VENTRICLE             IVC RV Basal diam:  6.10 cm     IVC diam: 2.70 cm RV Mid diam:    4.70 cm RV S prime:     12.60 cm/s TAPSE (M-mode): 2.3 cm  LEFT ATRIUM             Index        RIGHT ATRIUM           Index LA diam:        4.30 cm 1.87 cm/m   RA Area:     25.40 cm LA Vol (A2C):   94.5 ml 41.21 ml/m  RA Volume:   84.70 ml  36.93 ml/m LA Vol (A4C):   60.3 ml 26.29 ml/m LA Biplane Vol: 75.4 ml 32.88 ml/m AORTIC VALVE AV Area (Vmax):    1.18 cm AV Area (Vmean):   1.09 cm AV Area (VTI):     1.14 cm AV Vmax:           331.00 cm/s AV Vmean:          245.000 cm/s AV VTI:            0.875 m AV Peak Grad:      43.8 mmHg AV Mean Grad:      27.0 mmHg LVOT Vmax:         113.00 cm/s LVOT Vmean:        77.400 cm/s LVOT VTI:  0.288 m LVOT/AV VTI ratio: 0.33  AORTA Ao Root diam: 3.60 cm Ao Asc diam:  3.80 cm  TRICUSPID VALVE TR Peak grad:   45.7 mmHg TR Vmax:        338.00 cm/s  SHUNTS Systemic VTI:  0.29 m Systemic Diam: 2.10 cm  Stanly Leavens MD Electronically signed by Stanly Leavens MD Signature Date/Time: 11/03/2023/2:48:33 PM    Final    MONITORS  LONG TERM MONITOR (3-14 DAYS) 02/26/2021  Narrative The patient wore a Zio patch monitor for 13 days and 22 hours from May 9 through Feb 12, 2021.  The patient was in atrial fibrillation throughout the monitor with rates ranging from 32 on May 16 at 2:21 AM and 94 at 12:45 PM on May 10 (average rate of 50 bpm). T here is evidence for bundle branch block/IVCD.  There were occasional isolated PVCs (1.4%) without couplets or triplets.       ______________________________________________________________________________________________      EKG:        Recent Labs: 11/20/2023: ALT 17; BUN 17; Creatinine, Ser 0.91; Hemoglobin 13.9; Platelets 249; Potassium 4.5; Sodium 140; TSH 2.730   Recent Lipid Panel    Component Value Date/Time   CHOL 119 11/20/2023 1020   CHOL 111 03/01/2015 1033   TRIG 83 11/20/2023 1020   TRIG 90 03/01/2015 1033   HDL 39 (L) 11/20/2023 1020   HDL 41 03/01/2015 1033   CHOLHDL 3.1 11/20/2023 1020   CHOLHDL 4.1 07/19/2016 0949   VLDL 24 07/19/2016 0949   LDLCALC 64 11/20/2023 1020   LDLCALC 52 03/01/2015 1033                  Physical Exam:    VS:  BP (!) 120/50   Pulse (!) 53   Ht 6' 1 (1.854 m)   Wt 222 lb 9.6 oz (101 kg)   SpO2 96%   BMI 29.37 kg/m     Wt Readings from Last 3 Encounters:  05/19/24 222 lb 9.6 oz (101 kg)  11/05/23 231 lb 6.4 oz (105 kg)  10/08/23 232 lb 6.4 oz (105.4 kg)     GEN:  Well nourished, well developed in no acute distress HEENT: Normal NECK: No JVD; No carotid bruits LYMPHATICS: No lymphadenopathy CARDIAC: Irregularly irregular with a 3/6 harsh mid peaking crescendo decrescendo murmur at the right upper sternal border, A2 is audible RESPIRATORY:  Clear to auscultation without rales, wheezing or rhonchi  ABDOMEN: Soft, non-tender, non-distended MUSCULOSKELETAL:  No edema; No deformity  SKIN: Warm and dry NEUROLOGIC:  Alert and oriented x 3 PSYCHIATRIC:  Normal affect   Assessment & Plan Persistent atrial fibrillation Patient with slow ventricular response and left bundle branch morphology.  Discussed with him how his symptoms correlate with bradycardia.  Also discussed the natural history of aortic stenosis and his likely need for TAVR in the next few years.  I think considering his clinical picture, clear evidence of symptomatic bradycardia with slow atrial fibrillation, and context of aortic valve disease, permanent pacing is indicated and appropriate to improve his quality of life.  He would like to schedule pacemaker implantation with Dr. Cindie and I will copy him on this note.  He remains on apixaban . Coronary artery disease involving native coronary artery of native heart without  angina pectoris Patient stable, not on antiplatelet therapy in the context of chronic oral anticoagulation.  On atorvastatin  and ezetimibe  with LDL cholesterol of 64. Nonrheumatic moderate aortic valve stenosis I personally reviewed the patient's  echo images from February which show an LVEF of 60 to 65%, mild concentric LVH, moderate calcification and restriction of the aortic valve leaflets with mean gradient 27 mmHg, dimensionless index of 0.33, and aortic valve area of 1.14.  His exam is also consistent with moderate aortic stenosis.  I recommend that we cancel his echocardiogram for next month and reschedule him out about 6 months which will be a 1 year surveillance echo.  This will give him some time to see how his symptoms have improved with permanent pacing.  I will see him back a week or 2 after his echo is completed. Peripheral arterial disease (HCC) Stable symptoms of claudication reported.  Notes of Dr. Gretta reviewed with plans for medical therapy.  No evidence of resting ischemia or ischemic ulceration. Hyperlipidemia LDL goal <70 Continue rosuvastatin and ezetimibe .  LDL cholesterol is 64.     Medication Adjustments/Labs and Tests Ordered: Current medicines are reviewed at length with the patient today.  Concerns regarding medicines are outlined above.  No orders of the defined types were placed in this encounter.  No orders of the defined types were placed in this encounter.   Patient Instructions  Medication Instructions:  No medication changes were made at this visit. Continue current regimen.   *If you need a refill on your cardiac medications before your next appointment, please call your pharmacy*  Lab Work: None ordered today. If you have labs (blood work) drawn today and your tests are completely normal, you will receive your results only by: MyChart Message (if you have MyChart) OR A paper copy in the mail If you have any lab test that is abnormal or we need to  change your treatment, we will call you to review the results.  Testing/Procedures: Your physician has requested that you have an echocardiogram in February 2026. Echocardiography is a painless test that uses sound waves to create images of your heart. It provides your doctor with information about the size and shape of your heart and how well your heart's chambers and valves are working. This procedure takes approximately one hour. There are no restrictions for this procedure. Please do NOT wear cologne, perfume, aftershave, or lotions (deodorant is allowed). Please arrive 15 minutes prior to your appointment time.  Please note: We ask at that you not bring children with you during ultrasound (echo/ vascular) testing. Due to room size and safety concerns, children are not allowed in the ultrasound rooms during exams. Our front office staff cannot provide observation of children in our lobby area while testing is being conducted. An adult accompanying a patient to their appointment will only be allowed in the ultrasound room at the discretion of the ultrasound technician under special circumstances. We apologize for any inconvenience.   Follow-Up: At [facility], you and your health needs are our priority.  As part of our continuing mission to provide you with exceptional heart care, our providers are all part of one team.  This team includes your primary Cardiologist (physician) and Advanced Practice Providers or APPs (Physician Assistants and Nurse Practitioners) who all work together to provide you with the care you need, when you need it.  Your next appointment:   6 month(s)  Provider:   Ozell Fell, MD      Signed, Ozell Fell, MD  05/19/2024 12:09 PM    [facility]

## 2024-05-19 NOTE — Patient Instructions (Signed)
 Medication Instructions:  No medication changes were made at this visit. Continue current regimen.   *If you need a refill on your cardiac medications before your next appointment, please call your pharmacy*  Lab Work: None ordered today. If you have labs (blood work) drawn today and your tests are completely normal, you will receive your results only by: MyChart Message (if you have MyChart) OR A paper copy in the mail If you have any lab test that is abnormal or we need to change your treatment, we will call you to review the results.  Testing/Procedures: Your physician has requested that you have an echocardiogram in February 2026. Echocardiography is a painless test that uses sound waves to create images of your heart. It provides your doctor with information about the size and shape of your heart and how well your heart's chambers and valves are working. This procedure takes approximately one hour. There are no restrictions for this procedure. Please do NOT wear cologne, perfume, aftershave, or lotions (deodorant is allowed). Please arrive 15 minutes prior to your appointment time.  Please note: We ask at that you not bring children with you during ultrasound (echo/ vascular) testing. Due to room size and safety concerns, children are not allowed in the ultrasound rooms during exams. Our front office staff cannot provide observation of children in our lobby area while testing is being conducted. An adult accompanying a patient to their appointment will only be allowed in the ultrasound room at the discretion of the ultrasound technician under special circumstances. We apologize for any inconvenience.   Follow-Up: At [facility], you and your health needs are our priority.  As part of our continuing mission to provide you with exceptional heart care, our providers are all part of one team.  This team includes your primary Cardiologist (physician) and Advanced Practice Providers or  APPs (Physician Assistants and Nurse Practitioners) who all work together to provide you with the care you need, when you need it.  Your next appointment:   6 month(s)  Provider:   Ozell Fell, MD

## 2024-05-25 ENCOUNTER — Telehealth: Payer: Self-pay

## 2024-05-25 ENCOUNTER — Telehealth: Payer: Self-pay | Admitting: Cardiology

## 2024-05-25 NOTE — Telephone Encounter (Signed)
 Spoke with the patient to schedule pacemaker implant. Patient wants to discuss with Dr. Cindie before scheduling. Appointment has been made.

## 2024-05-25 NOTE — Telephone Encounter (Signed)
 Patient would like speak to Dr. Hiram nurse has some questions about up coming pacemaker procedure. If you can't reach patient you can call wife, Avelina at [phone]

## 2024-05-25 NOTE — Telephone Encounter (Signed)
-----   Message from OLE ONEIDA HOLTS sent at 05/22/2024  3:26 PM EDT ----- OK to get him scheduled. Thanks, CL ----- Message ----- From: Wonda Sharper, MD Sent: 05/19/2024  10:09 AM EDT To: OLE ONEIDA HOLTS, MD; Jaydan Meidinger Chauvigne, RN  [place] OLE - he'd like to move forward with PPM implant. Thx

## 2024-05-25 NOTE — Telephone Encounter (Signed)
 Called pt's wife. No answer, left message for her to return the call.

## 2024-06-14 ENCOUNTER — Other Ambulatory Visit: Payer: Self-pay

## 2024-06-14 ENCOUNTER — Other Ambulatory Visit (HOSPITAL_COMMUNITY)

## 2024-06-15 MED ORDER — ATORVASTATIN CALCIUM 40 MG PO TABS: 40.0000 mg | ORAL_TABLET | Freq: Every day | ORAL | 1 refills | Status: AC

## 2024-06-17 ENCOUNTER — Other Ambulatory Visit: Payer: Self-pay

## 2024-06-17 MED ORDER — IRBESARTAN 150 MG PO TABS: 150.0000 mg | ORAL_TABLET | Freq: Every day | ORAL | 1 refills | Status: AC

## 2024-06-18 ENCOUNTER — Ambulatory Visit: Attending: Cardiology | Admitting: Cardiology

## 2024-06-18 ENCOUNTER — Other Ambulatory Visit: Payer: Self-pay

## 2024-06-18 ENCOUNTER — Encounter: Payer: Self-pay | Admitting: Cardiology

## 2024-06-18 VITALS — BP 154/60 | HR 64 | Ht 73.0 in | Wt 227.0 lb

## 2024-06-18 DIAGNOSIS — R001 Bradycardia, unspecified: Secondary | ICD-10-CM

## 2024-06-18 DIAGNOSIS — I4821 Permanent atrial fibrillation: Secondary | ICD-10-CM

## 2024-06-18 NOTE — Patient Instructions (Signed)
 Medication Instructions:  Your physician recommends that you continue on your current medications as directed. Please refer to the Current Medication list given to you today.  *If you need a refill on your cardiac medications before your next appointment, please call your pharmacy*  Lab Work: BMET and CBC  Testing/Procedures: Pacemaker Implant Your physician has recommended that you have a pacemaker inserted. A pacemaker is a small device that is placed under the skin of your chest or abdomen to help control abnormal heart rhythms. This device uses electrical pulses to prompt the heart to beat at a normal rate. Pacemakers are used to treat heart rhythms that are too slow. Wire (leads) are attached to the pacemaker that goes into the chambers of you heart. This is done in the hospital and usually requires and overnight stay. Please see the instruction sheet given to you today for more information.   Follow-Up: At [facility], you and your health needs are our priority.  As part of our continuing mission to provide you with exceptional heart care, our providers are all part of one team.  This team includes your primary Cardiologist (physician) and Advanced Practice Providers or APPs (Physician Assistants and Nurse Practitioners) who all work together to provide you with the care you need, when you need it.  Your next appointment:   We will contact you to schedule your post-procedure appointments

## 2024-06-18 NOTE — H&P (View-Only) (Signed)
  Electrophysiology Office Follow up Visit Note:    Date:  06/18/2024   ID:  Derek Wells, DOB [birth year], MRN [id number]  PCP:  Charlott Dorn DELENA, MD  [facility] Cardiologist:  Ozell Fell, MD  [facility] Electrophysiologist:  OLE ONEIDA HOLTS, MD    Interval History:     Derek Wells is a 89 y.o. male who presents for a follow up visit.    I last saw the patient October 08, 2023 for his permanent atrial fibrillation and symptomatic bradycardia.  At the last appointment I recommended a permanent pacemaker but he wanted to wait.   He is with his wife today in clinic.  He again reports heart rates into the 30s.  We reviewed the pacemaker procedure in detail again.      Past medical, surgical, social and family history were reviewed.  ROS:   Please see the history of present illness.    All other systems reviewed and are negative.  EKGs/Labs/Other Studies Reviewed:    The following studies were reviewed today:  November 03, 2023 echo EF 60-65 RV low normal Trivial MR Moderate aortic stenosis         Physical Exam:    VS:  BP (!) 154/60   Pulse 64   Ht 6' 1 (1.854 m)   Wt 227 lb (103 kg)   SpO2 95%   BMI 29.95 kg/m     Wt Readings from Last 3 Encounters:  06/18/24 227 lb (103 kg)  05/19/24 222 lb 9.6 oz (101 kg)  11/05/23 231 lb 6.4 oz (105 kg)     GEN: no distress.  Elderly. CARD: Irregularly irregular, No MRG RESP: No IWOB. CTAB.      ASSESSMENT:    1. Permanent atrial fibrillation (HCC)   2. Bradycardia    PLAN:    In order of problems listed above:  #Permanent atrial fibrillation Continue anticoagulation.  Plan to hold this for 3 days prior to the pacemaker implant procedure    #Symptomatic bradycardia Again discussed permanent pacing during today's clinic appointment.  He would like to proceed.  Risks, benefits, alternatives to PPM implantation were discussed in detail with the patient today. The patient understands  that the risks include but are not limited to bleeding, infection, pneumothorax, perforation, tamponade, vascular damage, renal failure, MI, stroke, death, and lead dislodgement and wishes to proceed.   Plan for single-chamber [employer] pacemaker.  He will hold his Eliquis  for 3 days prior to the procedure.   Signed, OLE HOLTS, MD, [facility], [facility] 06/18/2024 3:18 PM    Electrophysiology [facility]

## 2024-06-18 NOTE — Progress Notes (Signed)
  Electrophysiology Office Follow up Visit Note:    Date:  06/18/2024   ID:  Derek Wells, DOB [birth year], MRN [id number]  PCP:  Derek Wells, Derek Wells  [facility] Cardiologist:  Derek Wells, Derek Wells  [facility] Electrophysiologist:  Derek Wells, Derek Wells    Interval History:     Derek Wells is a 89 y.o. male who presents for a follow up visit.    I last saw the patient October 08, 2023 for his permanent atrial fibrillation and symptomatic bradycardia.  At the last appointment I recommended a permanent pacemaker but he wanted to wait.   He is with his wife today in clinic.  He again reports heart rates into the 30s.  We reviewed the pacemaker procedure in detail again.      Past medical, surgical, social and family history were reviewed.  ROS:   Please see the history of present illness.    All other systems reviewed and are negative.  EKGs/Labs/Other Studies Reviewed:    The following studies were reviewed today:  November 03, 2023 echo EF 60-65 RV low normal Trivial MR Moderate aortic stenosis         Physical Exam:    VS:  BP (!) 154/60   Pulse 64   Ht 6' 1 (1.854 m)   Wt 227 lb (103 kg)   SpO2 95%   BMI 29.95 kg/m     Wt Readings from Last 3 Encounters:  06/18/24 227 lb (103 kg)  05/19/24 222 lb 9.6 oz (101 kg)  11/05/23 231 lb 6.4 oz (105 kg)     GEN: no distress.  Elderly. CARD: Irregularly irregular, No MRG RESP: No IWOB. CTAB.      ASSESSMENT:    1. Permanent atrial fibrillation (HCC)   2. Bradycardia    PLAN:    In order of problems listed above:  #Permanent atrial fibrillation Continue anticoagulation.  Plan to hold this for 3 days prior to the pacemaker implant procedure    #Symptomatic bradycardia Again discussed permanent pacing during today's clinic appointment.  He would like to proceed.  Risks, benefits, alternatives to PPM implantation were discussed in detail with the patient today. The patient understands  that the risks include but are not limited to bleeding, infection, pneumothorax, perforation, tamponade, vascular damage, renal failure, MI, stroke, death, and lead dislodgement and wishes to proceed.   Plan for single-chamber [employer] pacemaker.  He will hold his Eliquis  for 3 days prior to the procedure.   Signed, Derek Wells, Derek Wells, [facility], [facility] 06/18/2024 3:18 PM    Electrophysiology [facility]

## 2024-06-19 LAB — BASIC METABOLIC PANEL WITH GFR
BUN/Creatinine Ratio: 15 (ref 10–24)
BUN: 14 mg/dL (ref 8–27)
CO2: 20 mmol/L (ref 20–29)
Calcium: 9.9 mg/dL (ref 8.6–10.2)
Chloride: 102 mmol/L (ref 96–106)
Creatinine, Ser: 0.93 mg/dL (ref 0.76–1.27)
Glucose: 95 mg/dL (ref 70–99)
Potassium: 4.4 mmol/L (ref 3.5–5.2)
Sodium: 140 mmol/L (ref 134–144)
eGFR: 78 mL/min/1.73 (ref >59)

## 2024-06-19 LAB — CBC
Hematocrit: 44 % (ref 37.5–51.0)
Hemoglobin: 14.2 g/dL (ref 13.0–17.7)
MCH: 30.2 pg (ref 26.6–33.0)
MCHC: 32.3 g/dL (ref 31.5–35.7)
MCV: 94 fL (ref 79–97)
Platelets: 247 x10E3/uL (ref 150–450)
RBC: 4.7 x10E6/uL (ref 4.14–5.80)
RDW: 14.6 % (ref 11.6–15.4)
WBC: 8.9 x10E3/uL (ref 3.4–10.8)

## 2024-06-21 ENCOUNTER — Telehealth: Payer: Self-pay | Admitting: Cardiology

## 2024-06-21 ENCOUNTER — Telehealth: Payer: Self-pay

## 2024-06-21 NOTE — Telephone Encounter (Signed)
-----   Message from Nurse Doreatha C sent at 06/18/2024  3:42 PM EDT ----- Regarding: 10/6 PPM Implant  Precert:  MD: Cindie Type of implant: PPM Device manufacturer: [employer] Diagnosis: Bradycardia CPT code: PPM implant, single (RA) - 66793 C-code(s), including quantity (if indicated): C1786 Procedure scheduled (date/time): 10/6 at 3:00pm  Procedure:  Scrub given? Yes  Medication instructions: hold your Eliquis  (Apixaban ) for 3 day(s) prior to your procedure. Message sent to CVRR? No Added to calendar? Yes Orders entered? Yes Letter complete? Yes Scheduled with cath lab? Yes Labs ordered (CBC, BMET, PT/INR if on warfarin)? Yes Dye allergy? No Pre-meds ordered and instructions given? No, not needed Letter method: Given in clinic Special instructions: no H&P: 9/26  Follow-up:  Cassie/Angel, please schedule Routine.  Covering RN:  Please send this message to [employer], EP scheduler, EP Scheduling pool, and EP [employer].

## 2024-06-21 NOTE — Telephone Encounter (Signed)
Work up complete. 

## 2024-06-21 NOTE — Telephone Encounter (Signed)
 Pt would like to know if he's able to have the flu shot before his procedure.

## 2024-06-21 NOTE — Telephone Encounter (Signed)
 Spoke with pt and advised once you receive an vaccine your immune system typically decreases and is less able to protect your body until antibodies that provide protection are developed.  Pt states he will wait to get his flu shot until after his procedure.  He was appreciative of the information and assistance.

## 2024-06-23 ENCOUNTER — Ambulatory Visit: Admitting: Cardiovascular Disease

## 2024-06-27 NOTE — Pre-Procedure Instructions (Signed)
 Attempted to call patient regarding procedure instructions.  Left voicemail on the following items: Arrival time 1200 Nothing to eat or drink after midnight No meds AM of procedure Responsible person to drive you home and stay with you for 24 hrs Wash with special soap night before and morning of procedure If on anti-coagulant drug instructions Eliquis - last dose should have been 10/2

## 2024-06-28 ENCOUNTER — Ambulatory Visit (HOSPITAL_COMMUNITY)
Admission: RE | Disposition: A | Discharge status: Home / Self Care | Payer: Self-pay | Source: Home / Self Care | Attending: Cardiology

## 2024-06-28 ENCOUNTER — Ambulatory Visit (HOSPITAL_COMMUNITY)
Admission: RE | Admit: 2024-06-28 | Discharge: 2024-06-29 | Disposition: A | Discharge status: Home / Self Care | Attending: Cardiology | Admitting: Cardiology

## 2024-06-28 ENCOUNTER — Other Ambulatory Visit: Payer: Self-pay

## 2024-06-28 DIAGNOSIS — Z7901 Long term (current) use of anticoagulants: Secondary | ICD-10-CM | POA: Insufficient documentation

## 2024-06-28 DIAGNOSIS — R001 Bradycardia, unspecified: Secondary | ICD-10-CM | POA: Diagnosis not present

## 2024-06-28 DIAGNOSIS — I4821 Permanent atrial fibrillation: Secondary | ICD-10-CM | POA: Insufficient documentation

## 2024-06-28 HISTORY — PX: PACEMAKER IMPLANT: EP1218

## 2024-06-28 LAB — GLUCOSE, CAPILLARY: Glucose-Capillary: 104 mg/dL — ABNORMAL HIGH (ref 70–99)

## 2024-06-28 SURGERY — PACEMAKER IMPLANT

## 2024-06-28 MED ORDER — LIDOCAINE HCL (PF) 1 % IJ SOLN
INTRAMUSCULAR | Status: DC | PRN
  Administered 2024-06-28: 60 mL

## 2024-06-28 MED ORDER — AMLODIPINE BESYLATE 5 MG PO TABS
5.0000 mg | ORAL_TABLET | Freq: Every day | ORAL | Status: DC
  Administered 2024-06-29: 5 mg via ORAL

## 2024-06-28 MED ORDER — ACETAMINOPHEN 325 MG PO TABS: 325.0000 mg | ORAL_TABLET | ORAL | Status: DC | PRN

## 2024-06-28 MED ORDER — FENTANYL CITRATE (PF) 100 MCG/2ML IJ SOLN
INTRAMUSCULAR | Status: AC
  Filled 2024-06-28: qty 2

## 2024-06-28 MED ORDER — HEPARIN (PORCINE) IN NACL 1000-0.9 UT/500ML-% IV SOLN
INTRAVENOUS | Status: DC | PRN
  Administered 2024-06-28: 500 mL

## 2024-06-28 MED ORDER — CEFAZOLIN SODIUM-DEXTROSE 2-4 GM/100ML-% IV SOLN
INTRAVENOUS | Status: AC
  Filled 2024-06-28: qty 100

## 2024-06-28 MED ORDER — CEFAZOLIN SODIUM-DEXTROSE 2-4 GM/100ML-% IV SOLN
2.0000 g | INTRAVENOUS | Status: AC
  Administered 2024-06-28: 2 g via INTRAVENOUS

## 2024-06-28 MED ORDER — SODIUM CHLORIDE 0.9 % IV SOLN
80.0000 mg | INTRAVENOUS | Status: AC
  Administered 2024-06-28: 80 mg

## 2024-06-28 MED ORDER — POVIDONE-IODINE 10 % EX SWAB
2.0000 | Freq: Once | CUTANEOUS | Status: AC
  Administered 2024-06-28: 2 via TOPICAL

## 2024-06-28 MED ORDER — IRBESARTAN 150 MG PO TABS
150.0000 mg | ORAL_TABLET | Freq: Every day | ORAL | Status: DC
  Administered 2024-06-29: 150 mg via ORAL
  Filled 2024-06-28: qty 1

## 2024-06-28 MED ORDER — FENTANYL CITRATE (PF) 100 MCG/2ML IJ SOLN
INTRAMUSCULAR | Status: DC | PRN
  Administered 2024-06-28 (×2): 25 ug via INTRAVENOUS

## 2024-06-28 MED ORDER — MIDAZOLAM HCL 5 MG/5ML IJ SOLN
INTRAMUSCULAR | Status: DC | PRN
  Administered 2024-06-28 (×2): 1 mg via INTRAVENOUS

## 2024-06-28 MED ORDER — SODIUM CHLORIDE 0.9 % IV SOLN
INTRAVENOUS | Status: AC
  Filled 2024-06-28: qty 2

## 2024-06-28 MED ORDER — ONDANSETRON HCL 4 MG/2ML IJ SOLN: 4.0000 mg | Freq: Four times a day (QID) | INTRAMUSCULAR | Status: DC | PRN

## 2024-06-28 MED ORDER — MIDAZOLAM HCL 2 MG/2ML IJ SOLN
INTRAMUSCULAR | Status: AC
  Filled 2024-06-28: qty 2

## 2024-06-28 MED ORDER — EZETIMIBE 10 MG PO TABS
10.0000 mg | ORAL_TABLET | Freq: Every day | ORAL | Status: DC
Start: 2024-06-29 — End: 2024-06-29
  Administered 2024-06-29: 10 mg via ORAL

## 2024-06-28 MED ORDER — TIMOLOL MALEATE 0.5 % OP SOLN
1.0000 [drp] | Freq: Two times a day (BID) | OPHTHALMIC | Status: DC
  Administered 2024-06-29: 1 [drp] via OPHTHALMIC
  Filled 2024-06-28: qty 5

## 2024-06-28 MED ORDER — LIDOCAINE HCL 1 % IJ SOLN
INTRAMUSCULAR | Status: AC
  Filled 2024-06-28: qty 60

## 2024-06-28 MED ORDER — ATORVASTATIN CALCIUM 40 MG PO TABS
40.0000 mg | ORAL_TABLET | Freq: Every day | ORAL | Status: DC
  Administered 2024-06-29: 40 mg via ORAL
  Filled 2024-06-28 (×2): qty 1

## 2024-06-28 MED ORDER — CHLORHEXIDINE GLUCONATE 4 % EX SOLN
4.0000 | Freq: Once | CUTANEOUS | Status: DC
  Filled 2024-06-28: qty 60

## 2024-06-28 MED ORDER — SODIUM CHLORIDE 0.9 % IV SOLN: INTRAVENOUS | Status: DC

## 2024-06-28 SURGICAL SUPPLY — 11 items
CABLE SURGICAL S-101-97-12 (CABLE) ×1 IMPLANT
CATH SELECT PACE 669183 (CATHETERS) IMPLANT
CUTTER LV DELIVERY CATHETER 7 (MISCELLANEOUS) IMPLANT
LEAD INGEVITY 7842 59 (Lead) IMPLANT
PACEMAKER ACCOLADE SR (Pacemaker) IMPLANT
PAD DEFIB RADIO PHYSIO CONN (PAD) ×1 IMPLANT
SHEATH 7FR PRELUDE SNAP 13 (SHEATH) IMPLANT
SHEATH 8FR PRELUDE SNAP 13 (SHEATH) IMPLANT
SHEATH 9FR PRELUDE SNAP 13 (SHEATH) IMPLANT
TRAY PACEMAKER INSERTION (PACKS) ×1 IMPLANT
WIRE HI TORQ VERSACORE-J 145CM (WIRE) IMPLANT

## 2024-06-28 NOTE — Discharge Instructions (Signed)
 After Your Pacemaker   You have a Environmental education officer  If you have a Medtronic or Biotronik device, plug in your home monitor once you get home, and no manual interaction is required.   If you have an [employer] or [employer] device, plug your home monitor once you get home, sit near the device, and press the large activation button. Sit nearby until the process is complete, usually notated by lights on the monitor.   If you were set up for monitoring using an app on your phone, make sure the app remains open in the background and the Bluetooth remains on.  ACTIVITY Do not lift your arm above shoulder height for 1 week after your procedure. After 7 days, you may progress as below.  You should remove your sling 24 hours after your procedure, unless otherwise instructed by your provider.     Monday July 05, 2024  Tuesday July 06, 2024 Wednesday July 07, 2024 Thursday July 08, 2024   Do not lift, push, pull, or carry anything over 10 pounds with the affected arm until 6 weeks (Monday August 09, 2024 ) after your procedure.   You may drive AFTER your wound check, unless you have been told otherwise by your provider.   Ask your healthcare provider when you can go back to work   INCISION/Dressing This patient should resume their Eliquis  on Sunday July 04, 2024   If large square, outer bandage is left in place, this can be removed after 24 hours from your procedure. Do not remove steri-strips or glue as below.   If a PRESSURE DRESSING (a bulky dressing that usually goes up over your shoulder) was applied or left in place, please follow instructions given by your provider on when to return to have this removed.   Monitor your Pacemaker site for redness, swelling, and drainage. Call the device clinic at [phone] if you experience these symptoms or fever/chills.  If your incision is sealed with Steri-strips or staples, you may shower 7 days after your procedure  or when told by your provider. Do not remove the steri-strips or let the shower hit directly on your site. You may wash around your site with soap and water.    If you were discharged in a sling, please do not wear this during the day more than 48 hours after your surgery unless otherwise instructed. This may increase the risk of stiffness and soreness in your shoulder.   Avoid lotions, ointments, or perfumes over your incision until it is well-healed.  You may use a hot tub or a pool AFTER your wound check appointment if the incision is completely closed.  Pacemaker Alerts:  Some alerts are vibratory and others beep. These are NOT emergencies. Please call our office to let us  know. If this occurs at night or on weekends, it can wait until the next business day. Send a remote transmission.  If your device is capable of reading fluid status (for heart failure), you will be offered monthly monitoring to review this with you.   DEVICE MANAGEMENT Remote monitoring is used to monitor your pacemaker from home. This monitoring is scheduled every 91 days by our office. It allows us  to keep an eye on the functioning of your device to ensure it is working properly. You will routinely see your Electrophysiologist annually (more often if necessary).  This will appear as a REMOTE check on your MyChart schedule. These are automatic and there is nothing for you to manually  do unless otherwise instructed.  You should receive your ID card for your new device in 4-8 weeks. Keep this card with you at all times once received. Consider wearing a medical alert bracelet or necklace.  Your Pacemaker may be MRI compatible. This will be discussed at your next office visit/wound check.  You should avoid contact with strong electric or magnetic fields.   Do not use amateur (ham) radio equipment or electric (arc) welding torches. MP3 player headphones with magnets should not be used. Some devices are safe to use if held at  least 12 inches (30 cm) from your Pacemaker. These include power tools, lawn mowers, and speakers. If you are unsure if something is safe to use, ask your health care provider.  When using your cell phone, hold it to the ear that is on the opposite side from the Pacemaker. Do not leave your cell phone in a pocket over the Pacemaker.  You may safely use electric blankets, heating pads, computers, and microwave ovens.  Call the office right away if: You have chest pain. You feel more short of breath than you have felt before. You feel more light-headed than you have felt before. Your incision starts to open up.  This information is not intended to replace advice given to you by your health care provider. Make sure you discuss any questions you have with your health care provider.

## 2024-06-28 NOTE — Interval H&P Note (Signed)
 History and Physical Interval Note:  06/28/2024 1:43 PM  Derek Wells  has presented today for surgery, with the diagnosis of pulses.  The various methods of treatment have been discussed with the patient and family. After consideration of risks, benefits and other options for treatment, the patient has consented to  Procedure(s): PACEMAKER IMPLANT (N/A) as a surgical intervention.  The patient's history has been reviewed, patient examined, no change in status, stable for surgery.  I have reviewed the patient's chart and labs.  Questions were answered to the patient's satisfaction.     Derek Wells T Dorethy Tomey

## 2024-06-29 ENCOUNTER — Ambulatory Visit (HOSPITAL_COMMUNITY)

## 2024-06-29 ENCOUNTER — Encounter (HOSPITAL_COMMUNITY): Payer: Self-pay | Admitting: Cardiology

## 2024-06-29 DIAGNOSIS — I4821 Permanent atrial fibrillation: Secondary | ICD-10-CM | POA: Diagnosis not present

## 2024-06-29 DIAGNOSIS — I700 Atherosclerosis of aorta: Secondary | ICD-10-CM | POA: Diagnosis not present

## 2024-06-29 DIAGNOSIS — Z7901 Long term (current) use of anticoagulants: Secondary | ICD-10-CM | POA: Diagnosis not present

## 2024-06-29 DIAGNOSIS — Z950 Presence of cardiac pacemaker: Secondary | ICD-10-CM | POA: Diagnosis not present

## 2024-06-29 DIAGNOSIS — R001 Bradycardia, unspecified: Secondary | ICD-10-CM | POA: Diagnosis not present

## 2024-06-29 MED ORDER — ACETAMINOPHEN 325 MG PO TABS: 325.0000 mg | ORAL_TABLET | ORAL | Status: AC | PRN

## 2024-06-29 MED FILL — Midazolam HCl Inj 2 MG/2ML (Base Equivalent): INTRAMUSCULAR | Qty: 2 | Status: AC

## 2024-06-29 NOTE — TOC Transition Note (Signed)
 Transition of Care [facility]) - Discharge Note   Patient Details  Name: Derek Wells MRN: [id number] Date of Birth: 1933/08/08  [facility]) CM/SW Contact:  Waddell Barnie Rama, RN Phone Number: 06/29/2024, 9:56 AM   Clinical Narrative:     For dc today, he has no needs, wife will transport him home.        Patient Goals and CMS Choice            Discharge Placement                       Discharge Plan and Services Additional resources added to the After Visit Summary for                                       Social Drivers of Health (SDOH) Interventions SDOH Screenings   Food Insecurity: No Food Insecurity (06/28/2024)  Housing: Low Risk  (06/28/2024)  Transportation Needs: No Transportation Needs (06/28/2024)  Utilities: Not At Risk (06/28/2024)  Social Connections: Socially Isolated (06/28/2024)  Tobacco Use: Medium Risk (06/18/2024)     Readmission Risk Interventions     No data to display

## 2024-06-29 NOTE — Discharge Summary (Signed)
 ELECTROPHYSIOLOGY PROCEDURE DISCHARGE SUMMARY    Patient ID: Derek Wells,  MRN: [id number], DOB/AGE: [birth year] 89 y.o.  Admit date: 06/28/2024 Discharge date: 06/29/2024  Primary Care Physician: Charlott Dorn DELENA, MD  Primary Cardiologist: Ozell Fell, MD  Electrophysiologist: Dr. Cindie   Primary Discharge Diagnosis:  Symptomatic bradycardia status post pacemaker implantation this admission   Allergies  Allergen Reactions   Penicillin G     Other reaction(s): rash   Penicillins Rash    Has patient had a PCN reaction causing immediate rash, facial/tongue/throat swelling, SOB or lightheadedness with hypotension: no Has patient had a PCN reaction causing severe rash involving mucus membranes or skin necrosis: no Has patient had a PCN reaction that required hospitalization: no Has patient had a PCN reaction occurring within the last 10 years: yes If all of the above answers are NO, then may proceed with Cephalosporin use.      Procedures This Admission:  1.  Implantation of a [employer] PPM on 06/28/2024 by Dr. Cindie with details as outlined in the operative report.  There were no immediate post procedure complications.   2.  CXR on 06/29/2024 demonstrated no pneumothorax status post device implantation.       Brief HPI: Derek Wells is a 89 y.o. male was referred to electrophysiology in the outpatient setting for  consideration of PPM implantation.  Past medical history includes above.  The patient has had symptomatic bradycardia without reversible causes identified.  Risks, benefits, and alternatives to PPM implantation were reviewed with the patient who wished to proceed.   Hospital Course:  The patient was admitted and underwent implantation of a [employer] single chamber PPM with details as outlined above.  He was monitored on telemetry overnight which demonstrated appropriate pacing.  Left chest was without hematoma or  ecchymosis.  The device was interrogated and found to be functioning normally.  CXR was obtained and demonstrated no pneumothorax status post device implantation.  Wound care, arm mobility, and restrictions were reviewed with the patient.  The patient was examined and considered stable for discharge to home.    Anticoagulation resumption This patient should resume their Eliquis  on Sunday July 04, 2024    Physical Exam: Vitals:   06/28/24 2045 06/28/24 2300 06/29/24 0500 06/29/24 0755  BP: (!) 146/55 (!) 151/61 (!) 145/62 (!) 144/71  Pulse: 64 (!) 59 61   Resp: 18 19 18 20   Temp: 98 F (36.7 C) 98 F (36.7 C) 97.9 F (36.6 C) 98 F (36.7 C)  TempSrc: Oral Oral Oral Oral  SpO2: 90% 92% 91% 94%  Weight:      Height:        GEN- NAD. A&O x 3.  HEENT: Normocephalic, atraumatic Lungs- CTAB, Normal effort.  Heart- RRR, No M/G/R.  GI- Soft, NT, ND.  Extremities- No clubbing, cyanosis, or edema;  Skin- warm and dry, no rash or lesion, left chest without hematoma/ecchymosis  Discharge Medications:  Allergies as of 06/29/2024       Reactions   Penicillin G    Other reaction(s): rash   Penicillins Rash   Has patient had a PCN reaction causing immediate rash, facial/tongue/throat swelling, SOB or lightheadedness with hypotension: no Has patient had a PCN reaction causing severe rash involving mucus membranes or skin necrosis: no Has patient had a PCN reaction that required hospitalization: no Has patient had a PCN reaction occurring within the last 10 years: yes If all of the above answers  are NO, then may proceed with Cephalosporin use.        Medication List     PAUSE taking these medications    Eliquis  5 MG Tabs tablet Wait to take this until: July 03, 2024 Generic drug: apixaban  TAKE 1 TABLET BY MOUTH TWICE DAILY       TAKE these medications    acetaminophen  325 MG tablet Commonly known as: TYLENOL  Take 1-2 tablets (325-650 mg total) by mouth every 4  (four) hours as needed for mild pain (pain score 1-3).   amLODipine  5 MG tablet Commonly known as: NORVASC  TAKE 1 TABLET(5 MG) BY MOUTH DAILY   atorvastatin  40 MG tablet Commonly known as: LIPITOR Take 1 tablet (40 mg total) by mouth daily.   cholecalciferol 25 MCG (1000 UNIT) tablet Commonly known as: VITAMIN D3 Take 1,000 Units by mouth daily. 1 Tablet Daily   cyanocobalamin  1000 MCG tablet Commonly known as: VITAMIN B12 Take 1,000 mcg by mouth daily.   ezetimibe  10 MG tablet Commonly known as: ZETIA  Take 1 tablet (10 mg total) by mouth daily.   irbesartan  150 MG tablet Commonly known as: AVAPRO  Take 1 tablet (150 mg total) by mouth daily.   latanoprost 0.005 % ophthalmic solution Commonly known as: XALATAN Place 1 drop into both eyes at bedtime.   metFORMIN 500 MG tablet Commonly known as: GLUCOPHAGE Take 500 mg by mouth daily.   timolol 0.5 % ophthalmic solution Commonly known as: TIMOPTIC Place 1 drop into both eyes 2 (two) times daily.   vitamin C 1000 MG tablet Take 1,000 mg by mouth daily.        Disposition:  Home with usual follow up as in AVS   Duration of Discharge Encounter:  APP time: 23 minutes  Signed, Ozell Prentice Passey, PA-C  06/29/2024 9:27 AM

## 2024-06-29 NOTE — TOC CM/SW Note (Signed)
 Transition of Care [facility]) - Inpatient Brief Assessment   Patient Details  Name: Derek Wells MRN: [id number] Date of Birth: 07/20/1933  [facility]) CM/SW Contact:    Waddell Barnie Rama, RN Phone Number: 06/29/2024, 9:54 AM   Clinical Narrative: From home with spouse, has PCP and insurance on file, states has no HH services in place at this time , has a walker at home, but does not use it.  States family member (wife)  will transport them home at [employer] and family is support system, states gets medications from [facility] on [address].  Pta self ambulatory.   There are no ICM needs identified  at this time.  Please place consult for ICM needs.     Transition of Care Asessment: Insurance and Status: Insurance coverage has been reviewed Patient has primary care physician: Yes Home environment has been reviewed: home with wife Prior level of function:: indep Prior/Current Home Services: No current home services Social Drivers of Health Review: SDOH reviewed no interventions necessary Readmission risk has been reviewed: Yes Transition of care needs: no transition of care needs at this time

## 2024-06-29 NOTE — Progress Notes (Signed)
 Discharge instructions (including medications) discussed with and copy provided to patient/caregiver Answered patients questions and he is waiting for his wife in the discharge lounge.

## 2024-07-08 ENCOUNTER — Ambulatory Visit

## 2024-07-08 ENCOUNTER — Ambulatory Visit: Attending: Student in an Organized Health Care Education/Training Program

## 2024-07-08 DIAGNOSIS — R001 Bradycardia, unspecified: Secondary | ICD-10-CM

## 2024-07-08 LAB — CUP PACEART INCLINIC DEVICE CHECK
Date Time Interrogation Session: 20251016142629
Implantable Lead Connection Status: 753985
Implantable Lead Implant Date: 20251006
Implantable Lead Location: 753860
Implantable Lead Model: 7842
Implantable Lead Serial Number: 1510347
Implantable Pulse Generator Implant Date: 20251006
Lead Channel Impedance Value: 710 Ohm
Lead Channel Pacing Threshold Amplitude: 0.8 V
Lead Channel Pacing Threshold Pulse Width: 0.4 ms
Lead Channel Sensing Intrinsic Amplitude: 22.8 mV
Lead Channel Setting Pacing Amplitude: 3.5 V
Lead Channel Setting Pacing Pulse Width: 0.4 ms
Lead Channel Setting Sensing Sensitivity: 2.5 mV
Pulse Gen Serial Number: 957439
Zone Setting Status: 755011

## 2024-07-08 NOTE — Progress Notes (Signed)
 Normal single chamber pacemaker wound check. Presenting rhythm: VP 60.  Wound well healed. Routine testing performed. Thresholds, sensing, and impedance consistent with implant measurements and at 3.5V safety margin/auto capture until 3 month visit. Reviewed arm restrictions to continue for 6 weeks total post op.  Pt enrolled in remote follow-up.

## 2024-07-08 NOTE — Patient Instructions (Signed)

## 2024-07-09 ENCOUNTER — Other Ambulatory Visit (HOSPITAL_BASED_OUTPATIENT_CLINIC_OR_DEPARTMENT_OTHER): Payer: Self-pay

## 2024-07-09 MED ORDER — FLUZONE HIGH-DOSE 0.5 ML IM SUSY
0.5000 mL | PREFILLED_SYRINGE | Freq: Once | INTRAMUSCULAR | 0 refills | Status: AC
  Filled 2024-07-09: qty 0.5, 1d supply, fill #0

## 2024-07-14 ENCOUNTER — Ambulatory Visit

## 2024-07-14 DIAGNOSIS — I10 Essential (primary) hypertension: Secondary | ICD-10-CM | POA: Diagnosis not present

## 2024-07-14 DIAGNOSIS — D6869 Other thrombophilia: Secondary | ICD-10-CM | POA: Diagnosis not present

## 2024-07-14 DIAGNOSIS — I4892 Unspecified atrial flutter: Secondary | ICD-10-CM | POA: Diagnosis not present

## 2024-07-14 DIAGNOSIS — I25119 Atherosclerotic heart disease of native coronary artery with unspecified angina pectoris: Secondary | ICD-10-CM | POA: Diagnosis not present

## 2024-07-21 ENCOUNTER — Other Ambulatory Visit (HOSPITAL_BASED_OUTPATIENT_CLINIC_OR_DEPARTMENT_OTHER): Payer: Self-pay

## 2024-07-21 MED ORDER — COMIRNATY 30 MCG/0.3ML IM SUSY
0.3000 mL | PREFILLED_SYRINGE | Freq: Once | INTRAMUSCULAR | 0 refills | Status: AC
  Filled 2024-07-21: qty 0.3, 1d supply, fill #0

## 2024-07-30 ENCOUNTER — Other Ambulatory Visit: Payer: Self-pay | Admitting: Cardiovascular Disease

## 2024-08-04 DIAGNOSIS — H26493 Other secondary cataract, bilateral: Secondary | ICD-10-CM | POA: Diagnosis not present

## 2024-08-04 DIAGNOSIS — H401131 Primary open-angle glaucoma, bilateral, mild stage: Secondary | ICD-10-CM | POA: Diagnosis not present

## 2024-08-04 DIAGNOSIS — E119 Type 2 diabetes mellitus without complications: Secondary | ICD-10-CM | POA: Diagnosis not present

## 2024-08-04 DIAGNOSIS — H43813 Vitreous degeneration, bilateral: Secondary | ICD-10-CM | POA: Diagnosis not present

## 2024-08-10 ENCOUNTER — Ambulatory Visit (INDEPENDENT_AMBULATORY_CARE_PROVIDER_SITE_OTHER)

## 2024-08-10 DIAGNOSIS — I4821 Permanent atrial fibrillation: Secondary | ICD-10-CM

## 2024-08-11 LAB — CUP PACEART REMOTE DEVICE CHECK
Battery Remaining Longevity: 102 mo
Battery Remaining Percentage: 100 %
Brady Statistic RV Percent Paced: 94 %
Date Time Interrogation Session: 20251118030000
Implantable Lead Connection Status: 753985
Implantable Lead Implant Date: 20251006
Implantable Lead Location: 753860
Implantable Lead Model: 7842
Implantable Lead Serial Number: 1510347
Implantable Pulse Generator Implant Date: 20251006
Lead Channel Impedance Value: 765 Ohm
Lead Channel Pacing Threshold Amplitude: 0.6 V
Lead Channel Pacing Threshold Pulse Width: 0.4 ms
Lead Channel Setting Pacing Amplitude: 3.5 V
Lead Channel Setting Pacing Pulse Width: 0.4 ms
Lead Channel Setting Sensing Sensitivity: 2.5 mV
Pulse Gen Serial Number: 957439
Zone Setting Status: 755011

## 2024-08-12 ENCOUNTER — Telehealth: Payer: Self-pay | Admitting: Cardiology

## 2024-08-12 NOTE — Telephone Encounter (Signed)
 Returned call to Pt.  Pt planning to travel to [place] for Thanksgiving.  Trip will be for 3-4 days.  He had questions about his bedside monitor and flying.  Advised for short trips no need to take bedside monitor.  Advised when he gets to the airport, and the first TSA check in he should advise agent he has a pacemaker so that they can scan him safely.  All questions answered.  Pt thanked nurse for call back.

## 2024-08-12 NOTE — Telephone Encounter (Signed)
  Patient is going out of town and has some questions about traveling with a pacemaker. Please call

## 2024-08-12 NOTE — Progress Notes (Signed)
 Remote PPM Transmission

## 2024-08-30 DIAGNOSIS — K080 Exfoliation of teeth due to systemic causes: Secondary | ICD-10-CM | POA: Diagnosis not present

## 2024-09-10 ENCOUNTER — Other Ambulatory Visit: Payer: Self-pay | Admitting: Cardiovascular Disease

## 2024-09-10 DIAGNOSIS — I4819 Other persistent atrial fibrillation: Secondary | ICD-10-CM

## 2024-09-10 NOTE — Telephone Encounter (Signed)
 Eliquis  5mg  refill request received. Patient is 89 years old, weight-104kg, Crea-0.93 on 06/18/24, Diagnosis-Afib, and last seen by Dr. Cindie on 06/18/24. Dose is appropriate based on dosing criteria. Will send in refill to requested pharmacy.

## 2024-09-27 ENCOUNTER — Telehealth (HOSPITAL_COMMUNITY): Payer: Self-pay | Admitting: Cardiovascular Disease

## 2024-09-27 NOTE — Telephone Encounter (Signed)
 Called to schedule patient and he states he will callus back to schedule. Order will be removed from the active echo WQ and when patient calls back we will reinstate the order. Thank you.

## 2024-09-28 ENCOUNTER — Ambulatory Visit: Attending: Student | Admitting: Student

## 2024-09-28 ENCOUNTER — Encounter: Payer: Self-pay | Admitting: Student

## 2024-09-28 VITALS — BP 144/70 | HR 61 | Ht 73.0 in | Wt 230.0 lb

## 2024-09-28 DIAGNOSIS — I350 Nonrheumatic aortic (valve) stenosis: Secondary | ICD-10-CM | POA: Diagnosis not present

## 2024-09-28 DIAGNOSIS — I2510 Atherosclerotic heart disease of native coronary artery without angina pectoris: Secondary | ICD-10-CM | POA: Diagnosis not present

## 2024-09-28 DIAGNOSIS — R001 Bradycardia, unspecified: Secondary | ICD-10-CM | POA: Diagnosis not present

## 2024-09-28 DIAGNOSIS — I4821 Permanent atrial fibrillation: Secondary | ICD-10-CM | POA: Diagnosis not present

## 2024-09-28 LAB — CUP PACEART INCLINIC DEVICE CHECK
Date Time Interrogation Session: 20260106122717
Implantable Lead Connection Status: 753985
Implantable Lead Implant Date: 20251006
Implantable Lead Location: 753860
Implantable Lead Model: 7842
Implantable Lead Serial Number: 1510347
Implantable Pulse Generator Implant Date: 20251006
Lead Channel Impedance Value: 761 Ohm
Lead Channel Pacing Threshold Amplitude: 0.7 V
Lead Channel Pacing Threshold Pulse Width: 0.4 ms
Lead Channel Sensing Intrinsic Amplitude: 19.9 mV
Lead Channel Setting Pacing Amplitude: 2.5 V
Lead Channel Setting Pacing Pulse Width: 0.4 ms
Lead Channel Setting Sensing Sensitivity: 2.5 mV
Pulse Gen Serial Number: 957439
Zone Setting Status: 755011

## 2024-09-28 NOTE — Progress Notes (Signed)
" °  Electrophysiology Office Note:   ID:  Derek Wells, DOB 1933-02-28, MRN [id number]  Primary Cardiologist: Ozell Fell, MD Electrophysiologist: Fonda Kitty, MD      History of Present Illness:   Derek Wells is a 89 y.o. male with h/o permanent AF and symptomatic bradycardia s/p PPM seen today for routine electrophysiology follow-up s/p Pacemaker implant.  Since last being seen in our clinic the patient reports doing OK. He has not noticed as much improvement in energy as he was expecting. Otherwise, he denies chest pain, palpitations, dyspnea, PND, orthopnea, nausea, vomiting, dizziness, syncope, edema, weight gain, or early satiety.  Would like to go back to golfing.  Review of systems complete and found to be negative unless listed in HPI.   EP Information / Studies Reviewed:    EKG is ordered today. Personal review as below.  EKG Interpretation Date/Time:  Tuesday September 28 2024 11:49:55 EST Ventricular Rate:  61 PR Interval:    QRS Duration:  154 QT Interval:  448 QTC Calculation: 450 R Axis:   -61  Text Interpretation: Atrial fibrillation Ventricular-paced rhythm Reconfirmed by Lesia Heck ([id number]) on 09/28/2024 11:57:02 AM    PPM Interrogation-  reviewed in detail today,  See PACEART report.  Arrhythmia/Device History No specialty comments available.   Physical Exam:   VS:  BP (!) 144/70   Pulse 61   Ht 6' 1 (1.854 m)   Wt 230 lb (104.3 kg)   SpO2 97%   BMI 30.34 kg/m    Wt Readings from Last 3 Encounters:  09/28/24 230 lb (104.3 kg)  06/28/24 229 lb 4.5 oz (104 kg)  06/18/24 227 lb (103 kg)     GEN: No acute distress  NECK: No JVD; No carotid bruits CARDIAC: Regular rate and rhythm, no murmurs, rubs, gallops RESPIRATORY:  Clear to auscultation without rales, wheezing or rhonchi  ABDOMEN: Soft, non-tender, non-distended EXTREMITIES:  No edema; No deformity   ASSESSMENT AND PLAN:    Symptomatic bradycardia s/p [employer] PPM  Normal PPM  function See Pace Art report Threshold to chronic settings Rate response adjusted to be more aggressive.  Permanent AF EKG today shows AF with controlled (V paced) rate. Rates overlal controlled Continue eliquis  5 mg BID for CHA2DS2VASc of at least 5  CAD No s/s of ischemia.      Moderate AS Due for updated echo this spring.  Disposition:   Follow up with EP Team in 3-4 months to re-assess HRs/Rate response  Signed, Ozell Prentice Lesia, PA-C  "

## 2024-09-28 NOTE — Patient Instructions (Addendum)
 Medication Instructions:  No medication changes today. *If you need a refill on your cardiac medications before your next appointment, please call your pharmacy*  Lab Work: No labwork ordered today. If you have labs (blood work) drawn today and your tests are completely normal, you will receive your results only by: MyChart Message (if you have MyChart) OR A paper copy in the mail If you have any lab test that is abnormal or we need to change your treatment, we will call you to review the results.  Testing/Procedures: No testing ordered today  Follow-Up: At [facility], you and your health needs are our priority.  As part of our continuing mission to provide you with exceptional heart care, our providers are all part of one team.  This team includes your primary Cardiologist (physician) and Advanced Practice Providers or APPs (Physician Assistants and Nurse Practitioners) who all work together to provide you with the care you need, when you need it.  Your next appointment:   3-4 month(s)  Provider:   You may see Fonda Kitty, MD or one of the following Advanced Practice Providers on your designated Care Team:   Charlies Arthur, PA-C Mio Schellinger Andy Jarone Ostergaard, PA-C Suzann Riddle, NP Daphne Barrack, NP    We recommend signing up for the patient portal called MyChart.  Sign up information is provided on this After Visit Summary.  MyChart is used to connect with patients for Virtual Visits (Telemedicine).  Patients are able to view lab/test results, encounter notes, upcoming appointments, etc.  Non-urgent messages can be sent to your provider as well.   To learn more about what you can do with MyChart, go to [email or web address].

## 2024-10-02 ENCOUNTER — Ambulatory Visit: Payer: Self-pay | Admitting: Cardiology

## 2024-11-02 ENCOUNTER — Ambulatory Visit (HOSPITAL_COMMUNITY)

## 2024-11-05 ENCOUNTER — Ambulatory Visit: Admitting: Cardiovascular Disease

## 2024-11-09 ENCOUNTER — Encounter

## 2025-01-03 ENCOUNTER — Ambulatory Visit: Admitting: Physician Assistant

## 2025-02-08 ENCOUNTER — Encounter

## 2025-05-10 ENCOUNTER — Encounter

## 2025-08-09 ENCOUNTER — Encounter

## 2025-11-08 ENCOUNTER — Encounter
